# Patient Record
Sex: Female | Born: 1945 | Race: White | Hispanic: No | Marital: Married | Smoking: Never smoker
Health system: Southern US, Community
[De-identification: ages and names within clinical notes are randomized; demographics above are authoritative.]

## PROBLEM LIST (undated history)

## (undated) DIAGNOSIS — R7302 Impaired glucose tolerance (oral): Secondary | ICD-10-CM

## (undated) DIAGNOSIS — K219 Gastro-esophageal reflux disease without esophagitis: Secondary | ICD-10-CM

## (undated) DIAGNOSIS — K5790 Diverticulosis of intestine, part unspecified, without perforation or abscess without bleeding: Secondary | ICD-10-CM

## (undated) DIAGNOSIS — I809 Phlebitis and thrombophlebitis of unspecified site: Secondary | ICD-10-CM

## (undated) DIAGNOSIS — E039 Hypothyroidism, unspecified: Secondary | ICD-10-CM

## (undated) DIAGNOSIS — R296 Repeated falls: Secondary | ICD-10-CM

## (undated) DIAGNOSIS — Z86718 Personal history of other venous thrombosis and embolism: Secondary | ICD-10-CM

## (undated) DIAGNOSIS — F32A Depression, unspecified: Secondary | ICD-10-CM

## (undated) DIAGNOSIS — S42302A Unspecified fracture of shaft of humerus, left arm, initial encounter for closed fracture: Secondary | ICD-10-CM

## (undated) DIAGNOSIS — R768 Other specified abnormal immunological findings in serum: Secondary | ICD-10-CM

## (undated) DIAGNOSIS — E7800 Pure hypercholesterolemia, unspecified: Secondary | ICD-10-CM

## (undated) DIAGNOSIS — W19XXXA Unspecified fall, initial encounter: Secondary | ICD-10-CM

## (undated) DIAGNOSIS — I2699 Other pulmonary embolism without acute cor pulmonale: Secondary | ICD-10-CM

## (undated) DIAGNOSIS — F419 Anxiety disorder, unspecified: Secondary | ICD-10-CM

## (undated) DIAGNOSIS — I739 Peripheral vascular disease, unspecified: Secondary | ICD-10-CM

## (undated) DIAGNOSIS — F319 Bipolar disorder, unspecified: Secondary | ICD-10-CM

## (undated) DIAGNOSIS — I700 Atherosclerosis of aorta: Secondary | ICD-10-CM

## (undated) DIAGNOSIS — M8580 Other specified disorders of bone density and structure, unspecified site: Secondary | ICD-10-CM

## (undated) DIAGNOSIS — E785 Hyperlipidemia, unspecified: Secondary | ICD-10-CM

## (undated) DIAGNOSIS — S42309A Unspecified fracture of shaft of humerus, unspecified arm, initial encounter for closed fracture: Secondary | ICD-10-CM

## (undated) DIAGNOSIS — F329 Major depressive disorder, single episode, unspecified: Secondary | ICD-10-CM

## (undated) DIAGNOSIS — I708 Atherosclerosis of other arteries: Secondary | ICD-10-CM

## (undated) DIAGNOSIS — Z8619 Personal history of other infectious and parasitic diseases: Secondary | ICD-10-CM

## (undated) DIAGNOSIS — N318 Other neuromuscular dysfunction of bladder: Secondary | ICD-10-CM

## (undated) DIAGNOSIS — S42301A Unspecified fracture of shaft of humerus, right arm, initial encounter for closed fracture: Secondary | ICD-10-CM

## (undated) HISTORY — DX: Other specified disorders of bone density and structure, unspecified site: M85.80

## (undated) HISTORY — DX: Phlebitis and thrombophlebitis of unspecified site: I80.9

## (undated) HISTORY — DX: Bipolar disorder, unspecified: F31.9

## (undated) HISTORY — DX: Hyperlipidemia, unspecified: E78.5

## (undated) HISTORY — DX: Atherosclerosis of other arteries: I70.8

## (undated) HISTORY — DX: Diverticulosis of intestine, part unspecified, without perforation or abscess without bleeding: K57.90

## (undated) HISTORY — DX: Unspecified fall, initial encounter: W19.XXXA

## (undated) HISTORY — DX: Personal history of other infectious and parasitic diseases: Z86.19

## (undated) HISTORY — DX: Gastro-esophageal reflux disease without esophagitis: K21.9

## (undated) HISTORY — PX: INGUINAL HERNIA REPAIR: SUR1180

## (undated) HISTORY — DX: Peripheral vascular disease, unspecified: I73.9

## (undated) HISTORY — DX: Atherosclerosis of aorta: I70.0

## (undated) HISTORY — DX: Unspecified fracture of shaft of humerus, left arm, initial encounter for closed fracture: S42.302A

## (undated) HISTORY — DX: Other neuromuscular dysfunction of bladder: N31.8

## (undated) HISTORY — DX: Personal history of other venous thrombosis and embolism: Z86.718

## (undated) HISTORY — DX: Impaired glucose tolerance (oral): R73.02

## (undated) HISTORY — PX: CHOLECYSTECTOMY: SHX55

## (undated) HISTORY — DX: Repeated falls: R29.6

## (undated) HISTORY — DX: Anxiety disorder, unspecified: F41.9

## (undated) HISTORY — DX: Hypothyroidism, unspecified: E03.9

## (undated) HISTORY — DX: Unspecified fracture of shaft of humerus, right arm, initial encounter for closed fracture: S42.301A

---

## 2007-01-12 HISTORY — PX: OTHER SURGICAL HISTORY: SHX169

## 2008-07-14 DIAGNOSIS — S42302A Unspecified fracture of shaft of humerus, left arm, initial encounter for closed fracture: Secondary | ICD-10-CM

## 2008-07-14 HISTORY — DX: Unspecified fracture of shaft of humerus, left arm, initial encounter for closed fracture: S42.302A

## 2008-08-14 HISTORY — PX: COLONOSCOPY: SHX174

## 2008-11-22 LAB — FOLATE: Folate: 8.5

## 2009-03-28 LAB — HEMOGLOBIN A1C: A1c: 5.6

## 2009-05-16 LAB — T4: T3 Uptake: 26

## 2009-07-14 DIAGNOSIS — S42301A Unspecified fracture of shaft of humerus, right arm, initial encounter for closed fracture: Secondary | ICD-10-CM

## 2009-07-14 HISTORY — PX: ARTERIAL THROMBECTOMY: SHX558

## 2009-07-14 HISTORY — DX: Unspecified fracture of shaft of humerus, right arm, initial encounter for closed fracture: S42.301A

## 2009-08-16 LAB — TSH: TSH: 2.01 u[IU]/mL (ref 0.41–5.90)

## 2010-04-15 LAB — COMPREHENSIVE METABOLIC PANEL
ALT: 7 U/L (ref 7–35)
Alkaline Phosphatase: 67 U/L
Calcium: 9.6 mg/dL
Potassium: 4.1 mmol/L
Sodium: 142 mmol/L (ref 137–147)
Total Bilirubin: 0.3 mg/dL

## 2010-04-15 LAB — CBC AND DIFFERENTIAL: platelet count: 200

## 2010-04-15 LAB — LIPID PANEL: Direct LDL: 84

## 2010-11-12 ENCOUNTER — Ambulatory Visit: Payer: Self-pay | Admitting: Family Medicine

## 2010-11-14 ENCOUNTER — Encounter: Payer: Self-pay | Admitting: Family Medicine

## 2010-11-14 ENCOUNTER — Ambulatory Visit (INDEPENDENT_AMBULATORY_CARE_PROVIDER_SITE_OTHER): Payer: Medicare HMO | Admitting: Family Medicine

## 2010-11-14 VITALS — BP 138/64 | HR 80 | Temp 98.4°F | Ht <= 58 in | Wt 175.0 lb

## 2010-11-14 DIAGNOSIS — F319 Bipolar disorder, unspecified: Secondary | ICD-10-CM | POA: Insufficient documentation

## 2010-11-14 DIAGNOSIS — Z7901 Long term (current) use of anticoagulants: Secondary | ICD-10-CM

## 2010-11-14 DIAGNOSIS — F329 Major depressive disorder, single episode, unspecified: Secondary | ICD-10-CM

## 2010-11-14 DIAGNOSIS — Z86718 Personal history of other venous thrombosis and embolism: Secondary | ICD-10-CM

## 2010-11-14 DIAGNOSIS — Z5181 Encounter for therapeutic drug level monitoring: Secondary | ICD-10-CM

## 2010-11-14 DIAGNOSIS — E785 Hyperlipidemia, unspecified: Secondary | ICD-10-CM

## 2010-11-14 DIAGNOSIS — I739 Peripheral vascular disease, unspecified: Secondary | ICD-10-CM

## 2010-11-14 DIAGNOSIS — F419 Anxiety disorder, unspecified: Secondary | ICD-10-CM

## 2010-11-14 DIAGNOSIS — E039 Hypothyroidism, unspecified: Secondary | ICD-10-CM

## 2010-11-14 DIAGNOSIS — K219 Gastro-esophageal reflux disease without esophagitis: Secondary | ICD-10-CM

## 2010-11-14 DIAGNOSIS — F341 Dysthymic disorder: Secondary | ICD-10-CM

## 2010-11-14 LAB — TSH: TSH: 1.79 u[IU]/mL (ref 0.35–5.50)

## 2010-11-14 MED ORDER — BUPROPION HCL ER (SR) 150 MG PO TB12: 150.0000 mg | ORAL_TABLET | Freq: Two times a day (BID) | ORAL | Status: DC

## 2010-11-14 MED ORDER — CLONAZEPAM 0.5 MG PO TABS: 1.0000 mg | ORAL_TABLET | Freq: Two times a day (BID) | ORAL | Status: DC

## 2010-11-14 MED ORDER — LEVOTHYROXINE SODIUM 50 MCG PO TABS: 50.0000 ug | ORAL_TABLET | Freq: Every day | ORAL | Status: DC

## 2010-11-14 MED ORDER — ATORVASTATIN CALCIUM 10 MG PO TABS: 10.0000 mg | ORAL_TABLET | Freq: Every day | ORAL | Status: DC

## 2010-11-14 MED ORDER — DOXEPIN HCL 25 MG PO CAPS: 25.0000 mg | ORAL_CAPSULE | Freq: Every day | ORAL | Status: DC

## 2010-11-14 MED ORDER — WARFARIN SODIUM 5 MG PO TABS: 5.0000 mg | ORAL_TABLET | Freq: Every day | ORAL | Status: DC

## 2010-11-14 MED ORDER — CLOTRIMAZOLE-BETAMETHASONE 1-0.05 % EX CREA: 1.0000 "application " | TOPICAL_CREAM | Freq: Two times a day (BID) | CUTANEOUS | Status: DC

## 2010-11-14 NOTE — Assessment & Plan Note (Signed)
States stable on current regimen of klonopin, wellbutrin and doxepin.  Previously saw psych for management of depression.  States last psychiatrist told her to call for records when establshed.  Have asked her to request records from psych to mail here.  Signed ROI for records from prior PCP.  Refilled all meds x 3 mo except klonopin x 1 mo.

## 2010-11-14 NOTE — Assessment & Plan Note (Signed)
request records and review.  May need f/u here with [facility].  On coumadin and baby ASA

## 2010-11-14 NOTE — Patient Instructions (Addendum)
Coumadin and TSH checked today. Return in 3 months for welcome to medicare visit. Prescriptions printed out today. Good to meet you today, call us with questions.

## 2010-11-14 NOTE — Assessment & Plan Note (Signed)
Endorses hair loss, on exam no hair loss. Check TSH, free T4 today.

## 2010-11-14 NOTE — Progress Notes (Addendum)
  Subjective:    Patient ID: Kathryn Mathews, female    DOB: 12-02-1945, 65 y.o.   MRN: [id number]  HPI CC: new patient, establish  Recently got medicare insurance through [employer].  Moved to [place] from [place] in 07/2008.  Previously saw Dr. Dayna Barker in [place], [facility].  [place], [place] [phone]).  Also saw psychiatrist in [place].  Thyroid - takes synthroid daily, thinks thyroid check January but noticing hair falling out.  No skin changes, weight changes, diarrhea or constipation, heat or cold intolerance.  In 5 mo gained 15 lbs.  Not fasting today.  Due for INR check as well.  States tends to fluctuate.  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  Sounds like had clot on coumadin so ASA added.  Notes easy bruising but no blood in stool/urine.  Also with h/o PAD.  Preventative: Colonoscopy - ~2010.  Told ok. Mammogram - last told normal, doesn't remember. Tetanus - hasn't had.  Medications and allergies reviewed and updated as above. PMHx, FMhx SurgHx and SHx reviewed.  Review of Systems  Constitutional: Negative for fever, chills, activity change, appetite change, fatigue and unexpected weight change.  HENT: Negative for hearing loss and neck pain.   Eyes: Negative for visual disturbance.  Respiratory: Negative for cough, choking, shortness of breath and wheezing.   Cardiovascular: Positive for leg swelling. Negative for chest pain and palpitations.  Gastrointestinal: Negative for nausea, vomiting, abdominal pain, diarrhea, constipation, blood in stool and abdominal distention.  Genitourinary: Negative for hematuria and difficulty urinating.  Musculoskeletal: Negative for myalgias and arthralgias.  Skin: Negative for rash.  Neurological: Negative for dizziness, seizures, syncope and headaches.  Hematological: Bruises/bleeds easily.  Psychiatric/Behavioral: Negative for dysphoric mood (occasional, situational). The patient is not  nervous/anxious.        Objective:   Physical Exam  Nursing note and vitals reviewed. Constitutional: She is oriented to person, place, and time. She appears well-developed and well-nourished. No distress.  HENT:  Head: Normocephalic and atraumatic.  Right Ear: Hearing, external ear and ear canal normal.  Left Ear: Hearing, tympanic membrane, external ear and ear canal normal.  Nose: Nose normal.  Mouth/Throat: Oropharynx is clear and moist. No oropharyngeal exudate.       Cerumen covering R eardrum.  Eyes: Conjunctivae and EOM are normal. Pupils are equal, round, and reactive to light. No scleral icterus.  Neck: Normal range of motion. Neck supple. No thyromegaly present.  Cardiovascular: Normal rate, regular rhythm, normal heart sounds and intact distal pulses.   No murmur heard. Pulses:      Radial pulses are 2+ on the right side, and 2+ on the left side.  Pulmonary/Chest: Effort normal and breath sounds normal. No respiratory distress. She has no wheezes. She has no rales.  Abdominal: Soft. Bowel sounds are normal. She exhibits no distension and no mass. There is no tenderness. There is no rebound and no guarding.  Musculoskeletal: Normal range of motion.  Lymphadenopathy:    She has no cervical adenopathy.  Neurological: She is alert and oriented to person, place, and time.       CN grossly intact, station and gait intact  Skin: Skin is warm and dry. No rash noted.       Hair pull test - no hairs removed.  Psychiatric: She has a normal mood and affect. Her behavior is normal. Judgment and thought content normal.          Assessment & Plan:

## 2010-11-14 NOTE — Assessment & Plan Note (Signed)
INR today 1.8, pt states taking 5mg  coumadin daily.  Increase to 7.5mg  M, Th.  recheck in 2 wks.

## 2010-11-14 NOTE — Assessment & Plan Note (Signed)
Continue lipitor nightly for now, review records.

## 2010-11-18 ENCOUNTER — Telehealth: Payer: Self-pay | Admitting: *Deleted

## 2010-11-18 NOTE — Telephone Encounter (Signed)
Patient called and wants to know why her hair is falling out. She still thinks her thyroid is off since she is losing hair. She said that is why she was losing it before and once her levels got right, she stopped losing hair. I advised her that her thyroid levels were both in range, which means they were right on the current dose of medicine. She didn't seem to agree with the results. I told her that you were recommending a MVT and she wanted to know what that was. I explained that it was a combination pill with many different daily recommended vitamins in it. She wanted to know if the bottle would say "multivitamin". I explained to her that it would, and that if she needed help the pharmacist would help her. She still didn't understand why she needed that and how that was going to help her. I told her that she may be slightly deficient in some of her vitamin levels and maybe that was why she losing her hair which is why you wanted her to try the supplement. She still didn't sound very happy with that recommendation and said if it didn't work she would call back. I told her to take 1 everyday for a month and see if she notices a difference and if not, to let me know. She verbalized understanding.

## 2010-11-27 ENCOUNTER — Ambulatory Visit (INDEPENDENT_AMBULATORY_CARE_PROVIDER_SITE_OTHER): Payer: Medicare HMO | Admitting: Family Medicine

## 2010-11-27 DIAGNOSIS — Z7901 Long term (current) use of anticoagulants: Secondary | ICD-10-CM

## 2010-11-27 DIAGNOSIS — Z5181 Encounter for therapeutic drug level monitoring: Secondary | ICD-10-CM

## 2010-11-27 DIAGNOSIS — I739 Peripheral vascular disease, unspecified: Secondary | ICD-10-CM

## 2010-11-27 LAB — POCT INR: INR: 1.8

## 2010-11-27 NOTE — Patient Instructions (Signed)
Continue current dose, check in 4 weeks  

## 2010-11-28 ENCOUNTER — Ambulatory Visit: Payer: Medicare HMO

## 2010-12-22 ENCOUNTER — Encounter: Payer: Self-pay | Admitting: Family Medicine

## 2010-12-23 NOTE — Telephone Encounter (Signed)
Called to f/u on hair loss. Pt states bought hair vitamin with biotin this week, will start.  Will do this for the next month and let me know how sxs do.  States she say dermatologist in [place], didn't really help much.   Consider referral back to derm vs trial of rogaine?

## 2010-12-25 ENCOUNTER — Ambulatory Visit (INDEPENDENT_AMBULATORY_CARE_PROVIDER_SITE_OTHER): Payer: Medicare HMO | Admitting: Family Medicine

## 2010-12-25 DIAGNOSIS — Z7901 Long term (current) use of anticoagulants: Secondary | ICD-10-CM

## 2010-12-25 DIAGNOSIS — Z5181 Encounter for therapeutic drug level monitoring: Secondary | ICD-10-CM

## 2010-12-25 DIAGNOSIS — I739 Peripheral vascular disease, unspecified: Secondary | ICD-10-CM

## 2010-12-25 NOTE — Patient Instructions (Signed)
Continue 5 mg daily, except 7.5 mg Mon, Thurs, recheck 4 weeks

## 2010-12-29 ENCOUNTER — Encounter: Payer: Self-pay | Admitting: Family Medicine

## 2010-12-31 ENCOUNTER — Encounter: Payer: Self-pay | Admitting: Family Medicine

## 2011-01-22 ENCOUNTER — Ambulatory Visit (INDEPENDENT_AMBULATORY_CARE_PROVIDER_SITE_OTHER): Payer: Medicare HMO | Admitting: Internal Medicine

## 2011-01-22 DIAGNOSIS — Z7901 Long term (current) use of anticoagulants: Secondary | ICD-10-CM

## 2011-01-22 DIAGNOSIS — Z5181 Encounter for therapeutic drug level monitoring: Secondary | ICD-10-CM

## 2011-01-22 DIAGNOSIS — I739 Peripheral vascular disease, unspecified: Secondary | ICD-10-CM

## 2011-01-22 LAB — POCT INR: INR: 2.2

## 2011-01-22 NOTE — Patient Instructions (Signed)
Continue current dose, check in 4 weeks  

## 2011-02-05 ENCOUNTER — Other Ambulatory Visit: Payer: Self-pay | Admitting: Family Medicine

## 2011-02-05 DIAGNOSIS — Z0000 Encounter for general adult medical examination without abnormal findings: Secondary | ICD-10-CM

## 2011-02-05 DIAGNOSIS — E785 Hyperlipidemia, unspecified: Secondary | ICD-10-CM

## 2011-02-05 DIAGNOSIS — E039 Hypothyroidism, unspecified: Secondary | ICD-10-CM

## 2011-02-05 DIAGNOSIS — M8580 Other specified disorders of bone density and structure, unspecified site: Secondary | ICD-10-CM

## 2011-02-12 ENCOUNTER — Other Ambulatory Visit (INDEPENDENT_AMBULATORY_CARE_PROVIDER_SITE_OTHER): Payer: Medicare HMO | Admitting: Family Medicine

## 2011-02-12 DIAGNOSIS — M949 Disorder of cartilage, unspecified: Secondary | ICD-10-CM

## 2011-02-12 DIAGNOSIS — M8580 Other specified disorders of bone density and structure, unspecified site: Secondary | ICD-10-CM

## 2011-02-12 DIAGNOSIS — Z0000 Encounter for general adult medical examination without abnormal findings: Secondary | ICD-10-CM

## 2011-02-12 DIAGNOSIS — E039 Hypothyroidism, unspecified: Secondary | ICD-10-CM

## 2011-02-12 DIAGNOSIS — E785 Hyperlipidemia, unspecified: Secondary | ICD-10-CM

## 2011-02-12 LAB — LIPID PANEL
Cholesterol: 167 mg/dL (ref 0–200)
HDL: 48.5 mg/dL (ref >39.00)
Triglycerides: 161 mg/dL — ABNORMAL HIGH (ref 0.0–149.0)
VLDL: 32.2 mg/dL (ref 0.0–40.0)

## 2011-02-12 LAB — CBC WITH DIFFERENTIAL/PLATELET
Basophils Absolute: 0 10*3/uL (ref 0.0–0.1)
Eosinophils Absolute: 0.2 10*3/uL (ref 0.0–0.7)
Lymphocytes Relative: 30.8 % (ref 12.0–46.0)
MCHC: 33 g/dL (ref 30.0–36.0)
Neutrophils Relative %: 60.5 % (ref 43.0–77.0)
RDW: 14.8 % — ABNORMAL HIGH (ref 11.5–14.6)

## 2011-02-13 LAB — COMPREHENSIVE METABOLIC PANEL
BUN: 16 mg/dL (ref 6–23)
CO2: 24 mEq/L (ref 19–32)
Creatinine, Ser: 0.9 mg/dL (ref 0.4–1.2)
GFR: 66.72 mL/min (ref >60.00)
Glucose, Bld: 115 mg/dL — ABNORMAL HIGH (ref 70–99)
Sodium: 140 mEq/L (ref 135–145)
Total Bilirubin: 0.5 mg/dL (ref 0.3–1.2)
Total Protein: 7.2 g/dL (ref 6.0–8.3)

## 2011-02-18 ENCOUNTER — Ambulatory Visit (INDEPENDENT_AMBULATORY_CARE_PROVIDER_SITE_OTHER): Payer: Medicare HMO | Admitting: Family Medicine

## 2011-02-18 ENCOUNTER — Encounter: Payer: Self-pay | Admitting: Family Medicine

## 2011-02-18 DIAGNOSIS — R7309 Other abnormal glucose: Secondary | ICD-10-CM

## 2011-02-18 DIAGNOSIS — Z0000 Encounter for general adult medical examination without abnormal findings: Secondary | ICD-10-CM | POA: Insufficient documentation

## 2011-02-18 DIAGNOSIS — F329 Major depressive disorder, single episode, unspecified: Secondary | ICD-10-CM

## 2011-02-18 DIAGNOSIS — R7303 Prediabetes: Secondary | ICD-10-CM | POA: Insufficient documentation

## 2011-02-18 DIAGNOSIS — E785 Hyperlipidemia, unspecified: Secondary | ICD-10-CM

## 2011-02-18 DIAGNOSIS — Z1231 Encounter for screening mammogram for malignant neoplasm of breast: Secondary | ICD-10-CM

## 2011-02-18 DIAGNOSIS — M8580 Other specified disorders of bone density and structure, unspecified site: Secondary | ICD-10-CM

## 2011-02-18 DIAGNOSIS — E559 Vitamin D deficiency, unspecified: Secondary | ICD-10-CM | POA: Insufficient documentation

## 2011-02-18 DIAGNOSIS — Z0110 Encounter for examination of ears and hearing without abnormal findings: Secondary | ICD-10-CM

## 2011-02-18 DIAGNOSIS — Z5181 Encounter for therapeutic drug level monitoring: Secondary | ICD-10-CM

## 2011-02-18 DIAGNOSIS — Z23 Encounter for immunization: Secondary | ICD-10-CM

## 2011-02-18 DIAGNOSIS — Z7901 Long term (current) use of anticoagulants: Secondary | ICD-10-CM

## 2011-02-18 DIAGNOSIS — Z0100 Encounter for examination of eyes and vision without abnormal findings: Secondary | ICD-10-CM

## 2011-02-18 LAB — POCT INR: INR: 2.6

## 2011-02-18 MED ORDER — VITAMIN D3 1.25 MG (50000 UT) PO CAPS: 1.0000 | ORAL_CAPSULE | ORAL | Status: DC

## 2011-02-18 NOTE — Progress Notes (Signed)
Subjective:    Patient ID: Kathryn Mathews, female    DOB: 09-03-1945, 65 y.o.   MRN: [id number]  HPI CC: wellness visit  Noticing more bruising recently.  On ASA and coumadin, INR today.  Started on ASA states after had blood clot on coumadin. Lately more irritable towards husband.  Worried about gaining weight in midsection.   Reviewed blood work with patient - Glu fasting 115.  Vit D level low at 17.  Preventative:  Colonoscopy - ~2010. Told ok.  Mammogram - last told normal, several years ago.  Declines breast exam today, does not do breast exams at home. Pap smear - decided against doing years ago.  Always normal in past.  No problems with spotting, not sexually active. Tetanus - hasn't had nor shingles.  Hasn't had pneumonia shot.  Interested in all 3 DEXA 2010 - osteoporosis, femoral -2.1.  Vit D low at 17 this check. Living will - states "if I can't think, just let me go".  Thinks husband knows, would want husband then son to make decisions, but nothing formal yet.  Hasn't spoken with son about this. Vision doing ok.  No recent vision screen.    Medications and allergies reviewed and updated in chart. Patient Active Problem List  Diagnoses  . Anxiety and depression  . GERD (gastroesophageal reflux disease)  . HLD (hyperlipidemia)  . Hypothyroid  . History of DVT (deep vein thrombosis)  . PAD (peripheral artery disease)  . Osteopenia  . Medicare annual wellness visit, initial   Past Medical History  Diagnosis Date  . History of chicken pox   . Anxiety and depression     previously saw psychiatrist  . GERD (gastroesophageal reflux disease)   . HLD (hyperlipidemia)   . Hypothyroid     previously hyperthyroid  . History of DVT (deep vein thrombosis)     on chronic coumadin since 1973, h/o blood clots on coumadin 2011, added ASA QD  . Phlebitis     left leg stays swollen  . PAD (peripheral artery disease)   . Alopecia     well controlled per previous PCP  . Chronic  sinusitis   . Glucose intolerance (impaired glucose tolerance)   . Closed right humeral fracture 2011    coracoid nondisplaced, conservative therapy (Dr. Cleon Gustin)  . Closed left humeral fracture 2010    closed left numeral neck fracture, also h/o L wrist fx  . Hyperactivity of bladder   . Osteopenia     DEXA 2010 - T score -1.3 L spine, -2.1 femoral neck   Past Surgical History  Procedure Date  . Cholecystectomy   . Cesarean section 1973  . Arterial thrombectomy 07/2009    with R mid popliteal artery angioplasty [balloon] (2011)[[[  . Hospitalization 01/2007    depression, ECT   History  Substance Use Topics  . Smoking status: Never Smoker   . Smokeless tobacco: Never Used  . Alcohol Use: No   Family History  Problem Relation Age of Onset  . Coronary artery disease Mother 18  . Hyperlipidemia Mother   . Heart disease Mother   . Hypertension Mother   . Breast cancer Maternal Aunt 54  . Coronary artery disease Maternal Uncle   . Coronary artery disease Maternal Grandmother 70  . Stroke Neg Hx   . Diabetes Neg Hx   . Cancer Neg Hx    Allergies  Allergen Reactions  . Codeine Nausea And Vomiting   Current Outpatient Prescriptions on File Prior to  Visit  Medication Sig Dispense Refill  . aspirin 81 MG tablet Take 81 mg by mouth daily.        Marland Kitchen atorvastatin (LIPITOR) 10 MG tablet Take 1 tablet (10 mg total) by mouth at bedtime.  90 tablet  3  . buPROPion (WELLBUTRIN SR) 150 MG 12 hr tablet Take 1 tablet (150 mg total) by mouth 2 (two) times daily.  180 tablet  3  . clonazePAM (KLONOPIN) 0.5 MG tablet Take 2 tablets (1 mg total) by mouth 2 (two) times daily.  120 tablet  5  . clotrimazole-betamethasone (LOTRISONE) cream Apply 1 application topically 2 (two) times daily.  30 g  1  . doxepin (SINEQUAN) 25 MG capsule Take 1 capsule (25 mg total) by mouth at bedtime.  90 capsule  3  . warfarin (COUMADIN) 5 MG tablet Take 1 tablet (5 mg total) by mouth daily.  90 tablet  3  .  levothyroxine (SYNTHROID, LEVOTHROID) 50 MCG tablet Take 1 tablet (50 mcg total) by mouth daily.  90 tablet  3   Review of Systems  Constitutional: Negative for fever, chills, activity change, appetite change, fatigue and unexpected weight change.  HENT: Negative for hearing loss and neck pain.   Eyes: Negative for visual disturbance.  Respiratory: Negative for cough, chest tightness, shortness of breath and wheezing.   Cardiovascular: Negative for chest pain, palpitations and leg swelling.  Gastrointestinal: Negative for nausea, vomiting, abdominal pain, diarrhea, constipation, blood in stool and abdominal distention.  Genitourinary: Negative for hematuria and difficulty urinating.  Musculoskeletal: Negative for myalgias and arthralgias.  Skin: Negative for rash.  Neurological: Negative for dizziness, seizures, syncope and headaches.  Hematological: Bruises/bleeds easily.  Psychiatric/Behavioral: Negative for dysphoric mood. The patient is not nervous/anxious.        Objective:   Physical Exam  Nursing note and vitals reviewed. Constitutional: She is oriented to person, place, and time. She appears well-developed and well-nourished. No distress.  HENT:  Head: Normocephalic and atraumatic.  Right Ear: External ear normal.  Left Ear: External ear normal.  Nose: Nose normal.  Mouth/Throat: Oropharynx is clear and moist.  Eyes: Conjunctivae and EOM are normal. Pupils are equal, round, and reactive to light.  Neck: Normal range of motion. Neck supple. No thyromegaly present.  Cardiovascular: Normal rate, regular rhythm, normal heart sounds and intact distal pulses.   No murmur heard. Pulses:      Radial pulses are 2+ on the right side, and 2+ on the left side.  Pulmonary/Chest: Effort normal and breath sounds normal. No respiratory distress. She has no wheezes. She has no rales.  Abdominal: Soft. Bowel sounds are normal. She exhibits no distension and no mass. There is no tenderness.  There is no rebound and no guarding.  Musculoskeletal: Normal range of motion.  Lymphadenopathy:    She has no cervical adenopathy.  Neurological: She is alert and oriented to person, place, and time.       CN grossly intact, station and gait intact  Skin: Skin is warm and dry. No rash noted.  Psychiatric: She has a normal mood and affect. Her behavior is normal. Judgment and thought content normal.          Assessment & Plan:

## 2011-02-18 NOTE — Assessment & Plan Note (Signed)
Glu 115 fasting. Check a1c, recheck next visit. Discussed recent weight gain and contribution of this to possible prediabetes, recommended increase exercise/activity, avoid added sugars.

## 2011-02-18 NOTE — Patient Instructions (Addendum)
Check on zofran, make sure you're just taking as needed for nausea. Check on synthroid to make sure you're taking daily. Call eye doctor to set up for vision screen to check glaucoma. Vitamin D level low - start 50k units once weekly for 8 weeks then we will recheck. Sugar was elevated this visit - concern for prediabetes, increase activity (for weight loss and mood), watch added sugars. Increase dairy intake for more calcium. Return in 2-3 months for follow up and recheck vit D levels, as well as sugar. Pneumonia shot today. Check with insurance about shingles shot and tetanus (Tdap) and where they prefer it be administered either here or at pharmacy and let us know. Pass by up front to schedule mammogram.

## 2011-02-18 NOTE — Assessment & Plan Note (Signed)
For increasing irritability, recommended working on healthy ways to cope with stress including increased activity, treadmill/walking update if not better after this.

## 2011-02-18 NOTE — Patient Instructions (Signed)
Continue 5 mg daily, except 7.5 mg Mon, Thurs, recheck 2 weeks

## 2011-02-18 NOTE — Progress Notes (Signed)
Addended by: Josph Macho A on: 02/18/2011 02:02 PM   Modules accepted: Orders

## 2011-02-18 NOTE — Assessment & Plan Note (Signed)
Good control on lipitor.  Goal LDL <100 given PAD. Discussed elevated triglyceride

## 2011-02-18 NOTE — Assessment & Plan Note (Signed)
consider recheck DEXA.

## 2011-02-18 NOTE — Assessment & Plan Note (Signed)
I have personally reviewed the Medicare Annual Wellness questionnaire and have noted 1. The patient's medical and social history 2. Their use of alcohol, tobacco or illicit drugs 3. Their current medications and supplements 4. The patient's functional ability including ADL's, fall risks, home safety risks and hearing or visual             impairment. 5. Diet and physical activities 6. Evidence for depression or mood disorders The patients weight, height, BMI and visual acuity have been recorded in the chart I have made referrals, counseling and provided education to the patient based review of the above and I have provided the pt with a written personalized care plan for preventive services.   Reviewed and discussed healthy eating, discussed more calcium in diet - to increase dairy. Reviewed blood work.

## 2011-02-18 NOTE — Assessment & Plan Note (Signed)
Start supplementation weekly for 8 wks, then reassess. Consider rechecking dexa

## 2011-02-27 ENCOUNTER — Ambulatory Visit
Admission: RE | Admit: 2011-02-27 | Discharge: 2011-02-27 | Disposition: A | Discharge status: Home / Self Care | Payer: Medicare HMO | Source: Ambulatory Visit | Attending: Family Medicine | Admitting: Family Medicine

## 2011-02-27 DIAGNOSIS — Z1231 Encounter for screening mammogram for malignant neoplasm of breast: Secondary | ICD-10-CM

## 2011-03-03 ENCOUNTER — Encounter: Payer: Self-pay | Admitting: *Deleted

## 2011-03-04 ENCOUNTER — Ambulatory Visit (INDEPENDENT_AMBULATORY_CARE_PROVIDER_SITE_OTHER): Payer: Medicare HMO | Admitting: Family Medicine

## 2011-03-04 DIAGNOSIS — I739 Peripheral vascular disease, unspecified: Secondary | ICD-10-CM

## 2011-03-04 DIAGNOSIS — Z7901 Long term (current) use of anticoagulants: Secondary | ICD-10-CM

## 2011-03-04 DIAGNOSIS — Z5181 Encounter for therapeutic drug level monitoring: Secondary | ICD-10-CM

## 2011-03-04 NOTE — Patient Instructions (Signed)
Continue current dose, check in 4 weeks  

## 2011-04-01 ENCOUNTER — Ambulatory Visit (INDEPENDENT_AMBULATORY_CARE_PROVIDER_SITE_OTHER): Payer: Medicare HMO | Admitting: Family Medicine

## 2011-04-01 DIAGNOSIS — I739 Peripheral vascular disease, unspecified: Secondary | ICD-10-CM

## 2011-04-01 DIAGNOSIS — Z7901 Long term (current) use of anticoagulants: Secondary | ICD-10-CM

## 2011-04-01 DIAGNOSIS — Z5181 Encounter for therapeutic drug level monitoring: Secondary | ICD-10-CM

## 2011-04-01 LAB — POCT INR: INR: 3.4

## 2011-04-01 NOTE — Patient Instructions (Signed)
Change to 5 mg daily, recheck 2 weeks

## 2011-04-02 ENCOUNTER — Encounter: Payer: Self-pay | Admitting: Family Medicine

## 2011-04-02 ENCOUNTER — Ambulatory Visit (INDEPENDENT_AMBULATORY_CARE_PROVIDER_SITE_OTHER): Payer: Medicare HMO | Admitting: Family Medicine

## 2011-04-02 VITALS — BP 128/78 | HR 76 | Temp 98.3°F | Wt 172.8 lb

## 2011-04-02 DIAGNOSIS — F32A Depression, unspecified: Secondary | ICD-10-CM

## 2011-04-02 DIAGNOSIS — F419 Anxiety disorder, unspecified: Secondary | ICD-10-CM

## 2011-04-02 DIAGNOSIS — F341 Dysthymic disorder: Secondary | ICD-10-CM

## 2011-04-02 MED ORDER — DOXEPIN HCL 50 MG PO CAPS: 50.0000 mg | ORAL_CAPSULE | Freq: Every day | ORAL | Status: DC

## 2011-04-02 NOTE — Progress Notes (Signed)
  Subjective:    Patient ID: Kathryn Mathews, female    DOB: Apr 22, 1946, 65 y.o.   MRN: [id number]  HPI CC: mood issues  At beginning of visit pt states she mainly came in for and wants to talk about mood swings, not abd pain.  Endorses worsening mood swings.  Sometimes having angry outbursts, doesn't mean to.  Lately having trouble trusting husband, thinks he's moving things around at home behind her back, thinks he taking out money to save for himself, worried he's changed his will.  Unable to discuss with other family because doesn't think they will believe her, says they think she is crazy.  Does endorse feeling paranoid over husband and others being against her.  No HI.  No active thoughts of SI.  + passive suicide thoughts.  Contracts for safety.  On doxepin nightly as well as wellbutrin and clonzepam for anxiety/depression.  Previously saw psychiatrist in [place].  No known h/o bipolar or other mental illness.  Thinks would like to establish with local psychiatrist.  Endorses worsening anxiety, doesn't like to be around anyone or doing anything.  Stressed with husband.  However does feel safe at home.  INR checked yesterday, 3.4.  Denies bleeding from anywhere.  Did feel lightheaded a few times when walking but no falls, no SOB, HA.  Decreased dose, to recheck in 2 wks.  Down to 5mg  daily coumadin.  On coumadin and ASA for h/o blood clots, asa added when had clot on coumadin.  Review of Systems Per HPI    Objective:   Physical Exam  Nursing note and vitals reviewed. Constitutional: She appears well-developed and well-nourished. No distress.  Psychiatric: She has a normal mood and affect. Her speech is normal and behavior is normal. Judgment and thought content normal. Her affect is not angry, not blunt, not labile and not inappropriate. Thought content is not paranoid (not to examiner) and not delusional. Cognition and memory are normal. Cognition and memory are not impaired. She does not  express impulsivity or inappropriate judgment. She does not exhibit a depressed mood. She expresses no homicidal and no suicidal ideation. She expresses no suicidal plans and no homicidal plans.          Assessment & Plan:

## 2011-04-02 NOTE — Assessment & Plan Note (Signed)
Discussed recently deteriorating anxiety, depression.  Discussed option of referral to psychiatrist vs counseling to evaluate sxs and r/o other mental illness.  Also for assistance in titrating/managing meds.  Pt interested in establishing with psychiatry again, however not interested in outpatient group therapy as states has done this in past, did not feel benefited from that. Previously saw psychiatrist in [place]. PHQ 9 = 17/27 with much difficulty endorsed in dealing with day to day living GAD7 = 15/21 Recommended couples counseling. Given endorsed increased anxiety, increase doxepin to 50mg  nightly.  RTC 1 mo for f/u. Discussed how meds may not help if underlying issue is lack of trust in husband and that needs to be addressed so recommended couples counseling.  A total of 25 minutes were spent face-to-face with the patient during this encounter and over half of that time was spent on counseling and coordination of care

## 2011-04-02 NOTE — Patient Instructions (Addendum)
We will refer you to psychiatrist to get established.  Pass by Marion's office to set this up. I'd like to increase doxepin to 50mg  daily (2 pills daily). I'd recommend setting you up with couples therapist.

## 2011-04-04 ENCOUNTER — Other Ambulatory Visit: Payer: Self-pay | Admitting: *Deleted

## 2011-04-04 MED ORDER — VITAMIN D3 1.25 MG (50000 UT) PO CAPS: 1.0000 | ORAL_CAPSULE | ORAL | Status: DC

## 2011-04-04 NOTE — Telephone Encounter (Signed)
Patient requested refill

## 2011-04-04 NOTE — Telephone Encounter (Signed)
Resent electronically. 

## 2011-04-15 ENCOUNTER — Ambulatory Visit (INDEPENDENT_AMBULATORY_CARE_PROVIDER_SITE_OTHER): Payer: Medicare HMO | Admitting: Family Medicine

## 2011-04-15 DIAGNOSIS — I739 Peripheral vascular disease, unspecified: Secondary | ICD-10-CM

## 2011-04-15 DIAGNOSIS — Z5181 Encounter for therapeutic drug level monitoring: Secondary | ICD-10-CM

## 2011-04-15 DIAGNOSIS — Z7901 Long term (current) use of anticoagulants: Secondary | ICD-10-CM

## 2011-04-15 LAB — POCT INR: INR: 2.5

## 2011-04-15 NOTE — Patient Instructions (Signed)
Continue 5 mg daily, recheck 2 weeks

## 2011-04-29 ENCOUNTER — Ambulatory Visit (INDEPENDENT_AMBULATORY_CARE_PROVIDER_SITE_OTHER): Payer: Medicare HMO | Admitting: Family Medicine

## 2011-04-29 DIAGNOSIS — Z5181 Encounter for therapeutic drug level monitoring: Secondary | ICD-10-CM

## 2011-04-29 DIAGNOSIS — I739 Peripheral vascular disease, unspecified: Secondary | ICD-10-CM

## 2011-04-29 DIAGNOSIS — Z7901 Long term (current) use of anticoagulants: Secondary | ICD-10-CM

## 2011-04-29 NOTE — Patient Instructions (Signed)
Continue 5 mg daily, recheck 4 weeks 

## 2011-05-02 ENCOUNTER — Ambulatory Visit: Payer: Medicare HMO | Admitting: Family Medicine

## 2011-05-13 ENCOUNTER — Ambulatory Visit (INDEPENDENT_AMBULATORY_CARE_PROVIDER_SITE_OTHER): Payer: Medicare HMO | Admitting: Family Medicine

## 2011-05-13 ENCOUNTER — Encounter: Payer: Self-pay | Admitting: Family Medicine

## 2011-05-13 DIAGNOSIS — E559 Vitamin D deficiency, unspecified: Secondary | ICD-10-CM

## 2011-05-13 DIAGNOSIS — H6120 Impacted cerumen, unspecified ear: Secondary | ICD-10-CM | POA: Insufficient documentation

## 2011-05-13 MED ORDER — VITAMIN D3 1.25 MG (50000 UT) PO CAPS: 1.0000 | ORAL_CAPSULE | ORAL | Status: DC

## 2011-05-13 NOTE — Assessment & Plan Note (Signed)
R ear - irrigation performed successfully.

## 2011-05-13 NOTE — Progress Notes (Signed)
  Subjective:    Patient ID: Kathryn Mathews, female    DOB: 1946/06/23, 65 y.o.   MRN: [id number]  HPI CC: "my ears are stopped up"  R>L .  Tried 2 different types of otc meds, didn't help.  Going on 1 wk.  No pain, fevers, congestion, ear discharge, viral URTI sxs.    Interested in shingles shot.  Tried to go to drugstore, didn't offer.  requests refill of vit D weekly, completed 2 mo of this.  Vit d was 17 02/2011.  Review of Systems Per HPI    Objective:   Physical Exam  Nursing note and vitals reviewed. Constitutional: She appears well-developed and well-nourished. No distress.  HENT:  Head: Normocephalic and atraumatic.  Right Ear: Hearing, external ear and ear canal normal.  Left Ear: Hearing, tympanic membrane, external ear and ear canal normal.  Nose: Nose normal. No mucosal edema or rhinorrhea.  Mouth/Throat: Uvula is midline, oropharynx is clear and moist and mucous membranes are normal. No oropharyngeal exudate, posterior oropharyngeal edema, posterior oropharyngeal erythema or tonsillar abscesses.       R cerumen impaction deep canal covering TM  Eyes: Conjunctivae and EOM are normal. Pupils are equal, round, and reactive to light. No scleral icterus.  Neck: Normal range of motion. Neck supple.  Lymphadenopathy:    She has no cervical adenopathy.  Skin: Skin is warm and dry. No rash noted.  Psychiatric: She has a normal mood and affect.   irrigation performed pt tolerated well.  visualized TM afterwards   Assessment & Plan:

## 2011-05-13 NOTE — Assessment & Plan Note (Signed)
Weekly for another 2 mo, then recheck.

## 2011-05-13 NOTE — Patient Instructions (Addendum)
Call your insurace about the shingles shot to see if it is covered or how much it would cost and where is cheaper (here or pharmacy).  If you want to receive here, call for nurse visit. I have refilled weekly vitamin D for another 2 months, then would want you to take over the counter 1000 units daily. For ears - irrigation performed today. Come back in January, a few days prior for blood work (we will recheck vitamin D levels then).

## 2011-05-19 ENCOUNTER — Other Ambulatory Visit: Payer: Self-pay | Admitting: *Deleted

## 2011-05-19 MED ORDER — CLONAZEPAM 0.5 MG PO TABS: 1.0000 mg | ORAL_TABLET | Freq: Two times a day (BID) | ORAL | Status: DC

## 2011-05-19 NOTE — Telephone Encounter (Signed)
Pt is asking that refills on clonazepam be sent to [facility].  Fax form is in your box.

## 2011-05-19 NOTE — Telephone Encounter (Signed)
Filled and placed in my out box. 

## 2011-05-21 ENCOUNTER — Other Ambulatory Visit: Payer: Self-pay | Admitting: Family Medicine

## 2011-05-26 ENCOUNTER — Ambulatory Visit (INDEPENDENT_AMBULATORY_CARE_PROVIDER_SITE_OTHER): Payer: Medicare HMO | Admitting: Family Medicine

## 2011-05-26 DIAGNOSIS — Z5181 Encounter for therapeutic drug level monitoring: Secondary | ICD-10-CM

## 2011-05-26 DIAGNOSIS — I739 Peripheral vascular disease, unspecified: Secondary | ICD-10-CM

## 2011-05-26 DIAGNOSIS — Z7901 Long term (current) use of anticoagulants: Secondary | ICD-10-CM

## 2011-05-26 LAB — POCT INR: INR: 2.2

## 2011-05-26 NOTE — Patient Instructions (Signed)
Continue current dose, check in 4 weeks  

## 2011-05-27 ENCOUNTER — Ambulatory Visit: Payer: Medicare HMO

## 2011-06-23 ENCOUNTER — Ambulatory Visit (INDEPENDENT_AMBULATORY_CARE_PROVIDER_SITE_OTHER): Payer: Medicare HMO | Admitting: Family Medicine

## 2011-06-23 ENCOUNTER — Other Ambulatory Visit: Payer: Self-pay | Admitting: Internal Medicine

## 2011-06-23 DIAGNOSIS — Z7901 Long term (current) use of anticoagulants: Secondary | ICD-10-CM

## 2011-06-23 DIAGNOSIS — Z5181 Encounter for therapeutic drug level monitoring: Secondary | ICD-10-CM

## 2011-06-23 DIAGNOSIS — I739 Peripheral vascular disease, unspecified: Secondary | ICD-10-CM

## 2011-06-23 DIAGNOSIS — Z23 Encounter for immunization: Secondary | ICD-10-CM

## 2011-06-23 LAB — POCT INR: INR: 2

## 2011-06-23 MED ORDER — CLOTRIMAZOLE-BETAMETHASONE 1-0.05 % EX CREA: TOPICAL_CREAM | Freq: Two times a day (BID) | CUTANEOUS | Status: DC

## 2011-06-23 NOTE — Telephone Encounter (Signed)
Patient requested refill on her cream.

## 2011-06-23 NOTE — Patient Instructions (Signed)
Continue current dose, check in 4 weeks  

## 2011-06-23 NOTE — Telephone Encounter (Signed)
Sent in

## 2011-06-24 ENCOUNTER — Other Ambulatory Visit: Payer: Self-pay | Admitting: Family Medicine

## 2011-06-25 ENCOUNTER — Telehealth: Payer: Self-pay | Admitting: *Deleted

## 2011-06-25 NOTE — Telephone Encounter (Signed)
Patient called and said when she got up out of bed this morning she was dizzy and off balance. She thought that it may have been a reaction to the flu vaccine that she received on Monday. She said the feeling subsided after sitting still for a while. She said she got nauseated, but had no other symptoms. Offered to schedule appt, but she said she would wait to see how she felt and call back since she was feeling better when I spoke to her.

## 2011-06-26 NOTE — Telephone Encounter (Signed)
Noted  

## 2011-06-30 ENCOUNTER — Telehealth: Payer: Self-pay | Admitting: *Deleted

## 2011-06-30 MED ORDER — CLOTRIMAZOLE-BETAMETHASONE 1-0.05 % EX CREA: TOPICAL_CREAM | Freq: Two times a day (BID) | CUTANEOUS | Status: DC

## 2011-06-30 NOTE — Telephone Encounter (Signed)
Request for Lotrisone cream refill; would like larger quantity [tube], states she goes through small tubes too fast.

## 2011-06-30 NOTE — Telephone Encounter (Signed)
Sent in

## 2011-07-21 ENCOUNTER — Ambulatory Visit (INDEPENDENT_AMBULATORY_CARE_PROVIDER_SITE_OTHER): Payer: Medicare HMO | Admitting: Family Medicine

## 2011-07-21 ENCOUNTER — Encounter: Payer: Self-pay | Admitting: Family Medicine

## 2011-07-21 DIAGNOSIS — R1310 Dysphagia, unspecified: Secondary | ICD-10-CM | POA: Insufficient documentation

## 2011-07-21 DIAGNOSIS — Z5181 Encounter for therapeutic drug level monitoring: Secondary | ICD-10-CM

## 2011-07-21 DIAGNOSIS — I739 Peripheral vascular disease, unspecified: Secondary | ICD-10-CM

## 2011-07-21 DIAGNOSIS — Z7901 Long term (current) use of anticoagulants: Secondary | ICD-10-CM

## 2011-07-21 NOTE — Patient Instructions (Signed)
Let me know if this continues or if you want to be evaluated by stomach doctor. This could be from strictures or ulcer or something else.   Next step would be evaluation by stomach doctor. Call us with questions or if this happens again.

## 2011-07-21 NOTE — Assessment & Plan Note (Signed)
Esophageal phase with what sounds like some odynophagia. Discussed I would want to refer to GI for further eval, possible EGD to eval for cause of sxs last week such as stricture, ulcer, cancer. Pt declines, states that since improved doesn't want further evaluation.  Understands ddx. Will let me know if sxs return for referral.

## 2011-07-21 NOTE — Progress Notes (Signed)
Subjective:    Patient ID: Kathryn Mathews, female    DOB: [birth year], 66 y.o.   MRN: [id number]  HPI CC: abd issues  66 yo with h/o hypothyroid, DVT on chronic coumadin, GERD, anxiety/depression, HLD, prediabetes, PAD presents with 1 wk h/o cold sxs, that led to epistaxis after blowing nose (self limited) also with sensation that food would get "lodged in my esophagus" whenever swallowing.  Felt food stuck.  + pain with swallowing.  This happened for 3 days last week.  Happened with all kinds of food, each time ate something.  Tried to only eat soft food, still didn't help.  Pain with liquids as well but not as bad.  Pain and stuck feeling - points to epigastric region.  Cold sxs resolved.  Now overall feeling better.  No more sxs since Friday  H/o GERD, not on any meds as only bothers her very intermittently.  Denies endoscopy in past.  No heartburn/reflux sxs recently.  No fevers/chills, appetite or weight changes.   Wt Readings from Last 3 Encounters:  07/21/11 175 lb (79.379 kg)  05/13/11 176 lb 12.8 oz (80.196 kg)  04/02/11 172 lb 12 oz (78.359 kg)   Medications and allergies reviewed and updated in chart.  Past histories reviewed and updated if relevant as below. Patient Active Problem List  Diagnoses  . Anxiety and depression  . GERD (gastroesophageal reflux disease)  . HLD (hyperlipidemia)  . Hypothyroid  . History of DVT (deep vein thrombosis)  . PAD (peripheral artery disease)  . Osteopenia  . Medicare annual wellness visit, initial  . Prediabetes  . Vitamin D deficiency  . Cerumen impaction   Past Medical History  Diagnosis Date  . History of chicken pox   . Anxiety and depression     previously saw psychiatrist  . GERD (gastroesophageal reflux disease)   . HLD (hyperlipidemia)   . Hypothyroid     previously hyperthyroid  . History of DVT (deep vein thrombosis)     on chronic coumadin since 1973, h/o blood clots on coumadin 2011, added ASA QD  . Phlebitis    left leg stays swollen  . PAD (peripheral artery disease)   . Alopecia     well controlled per previous PCP  . Chronic sinusitis   . Glucose intolerance (impaired glucose tolerance)   . Closed right humeral fracture 2011    coracoid nondisplaced, conservative therapy (Dr. Cleon Gustin)  . Closed left humeral fracture 2010    closed left numeral neck fracture, also h/o L wrist fx  . Hyperactivity of bladder   . Osteopenia     DEXA 2010 - T score -1.3 L spine, -2.1 femoral neck   Past Surgical History  Procedure Date  . Cholecystectomy   . Cesarean section 1973  . Arterial thrombectomy 07/2009    with R mid popliteal artery angioplasty [balloon] (2011)[[[  . Hospitalization 01/2007    depression, ECT   History  Substance Use Topics  . Smoking status: Never Smoker   . Smokeless tobacco: Never Used  . Alcohol Use: No   Family History  Problem Relation Age of Onset  . Coronary artery disease Mother 31  . Hyperlipidemia Mother   . Heart disease Mother   . Hypertension Mother   . Breast cancer Maternal Aunt 54  . Coronary artery disease Maternal Uncle   . Coronary artery disease Maternal Grandmother 70  . Stroke Neg Hx   . Diabetes Neg Hx   . Cancer Neg Hx  Allergies  Allergen Reactions  . Codeine Nausea And Vomiting   Current Outpatient Prescriptions on File Prior to Visit  Medication Sig Dispense Refill  . aspirin 81 MG tablet Take 81 mg by mouth daily.        Marland Kitchen atorvastatin (LIPITOR) 10 MG tablet Take 1 tablet (10 mg total) by mouth at bedtime.  90 tablet  3  . Biotin 1000 MCG tablet Take 1,000 mcg by mouth 2 (two) times daily.        Marland Kitchen buPROPion (WELLBUTRIN SR) 150 MG 12 hr tablet Take 1 tablet (150 mg total) by mouth 2 (two) times daily.  180 tablet  3  . Cholecalciferol (VITAMIN D3) 50000 UNITS CAPS Take 1 capsule by mouth once a week.  8 capsule  0  . clonazePAM (KLONOPIN) 0.5 MG tablet Take 2 tablets (1 mg total) by mouth 2 (two) times daily.  120 tablet  5  .  clotrimazole-betamethasone (LOTRISONE) cream Apply topically 2 (two) times daily. No more than 2 weeks at a time.  45 g  0  . doxepin (SINEQUAN) 50 MG capsule Take 1 capsule (50 mg total) by mouth at bedtime.  90 capsule  3  . levothyroxine (SYNTHROID, LEVOTHROID) 50 MCG tablet Take 1 tablet (50 mcg total) by mouth daily.  90 tablet  3  . warfarin (COUMADIN) 5 MG tablet Take 1 tablet (5 mg total) by mouth daily.  90 tablet  3    Review of Systems Per HPI    Objective:   Physical Exam  Nursing note and vitals reviewed. Constitutional: She appears well-developed and well-nourished. No distress.  HENT:  Head: Normocephalic and atraumatic.  Mouth/Throat: Oropharynx is clear and moist. No oropharyngeal exudate.  Eyes: Conjunctivae and EOM are normal. Pupils are equal, round, and reactive to light. No scleral icterus.  Neck: Normal range of motion. Neck supple. No thyromegaly present.  Cardiovascular: Normal rate, regular rhythm, normal heart sounds and intact distal pulses.   No murmur heard. Pulmonary/Chest: Effort normal and breath sounds normal. No respiratory distress. She has no wheezes. She has no rales.  Abdominal: Soft. Bowel sounds are normal. She exhibits no distension and no mass. There is no hepatosplenomegaly. There is tenderness (mild) in the epigastric area. There is no rigidity, no rebound and no guarding.  Lymphadenopathy:    She has no cervical adenopathy.      Assessment & Plan:

## 2011-07-21 NOTE — Patient Instructions (Signed)
Continue current dose, check in 4 weeks  

## 2011-08-05 ENCOUNTER — Other Ambulatory Visit: Payer: Self-pay | Admitting: Family Medicine

## 2011-08-05 DIAGNOSIS — E039 Hypothyroidism, unspecified: Secondary | ICD-10-CM

## 2011-08-05 MED ORDER — VITAMIN D 50 MCG (2000 UT) PO CAPS: 2000.0000 [IU] | ORAL_CAPSULE | Freq: Every day | ORAL | Status: DC

## 2011-08-05 NOTE — Telephone Encounter (Signed)
No may stop, and start vit D 2000IU daily. Will check labwork when comes in next week. Placed orders in chart.

## 2011-08-05 NOTE — Telephone Encounter (Signed)
Patient notified and will begin OTC supplement as directed.

## 2011-08-05 NOTE — Telephone Encounter (Signed)
Does she need to continue this? 

## 2011-08-07 ENCOUNTER — Other Ambulatory Visit: Payer: Self-pay | Admitting: Family Medicine

## 2011-08-07 DIAGNOSIS — E039 Hypothyroidism, unspecified: Secondary | ICD-10-CM

## 2011-08-07 DIAGNOSIS — Z86718 Personal history of other venous thrombosis and embolism: Secondary | ICD-10-CM

## 2011-08-07 DIAGNOSIS — R7303 Prediabetes: Secondary | ICD-10-CM

## 2011-08-11 ENCOUNTER — Other Ambulatory Visit (INDEPENDENT_AMBULATORY_CARE_PROVIDER_SITE_OTHER): Payer: Medicare HMO

## 2011-08-11 DIAGNOSIS — R7309 Other abnormal glucose: Secondary | ICD-10-CM

## 2011-08-11 DIAGNOSIS — Z7901 Long term (current) use of anticoagulants: Secondary | ICD-10-CM

## 2011-08-11 DIAGNOSIS — E039 Hypothyroidism, unspecified: Secondary | ICD-10-CM

## 2011-08-11 DIAGNOSIS — R7303 Prediabetes: Secondary | ICD-10-CM

## 2011-08-11 DIAGNOSIS — Z5181 Encounter for therapeutic drug level monitoring: Secondary | ICD-10-CM

## 2011-08-11 DIAGNOSIS — I739 Peripheral vascular disease, unspecified: Secondary | ICD-10-CM

## 2011-08-11 LAB — HEMOGLOBIN A1C: Hgb A1c MFr Bld: 5.5 % (ref 4.6–6.5)

## 2011-08-11 LAB — BASIC METABOLIC PANEL
CO2: 26 mEq/L (ref 19–32)
Calcium: 9.3 mg/dL (ref 8.4–10.5)
Creatinine, Ser: 0.9 mg/dL (ref 0.4–1.2)
GFR: 63.36 mL/min (ref >60.00)
Sodium: 141 mEq/L (ref 135–145)

## 2011-08-11 LAB — TSH: TSH: 4.49 u[IU]/mL (ref 0.35–5.50)

## 2011-08-12 LAB — VITAMIN D 25 HYDROXY (VIT D DEFICIENCY, FRACTURES): Vit D, 25-Hydroxy: 39 ng/mL (ref 30–89)

## 2011-08-14 ENCOUNTER — Ambulatory Visit (INDEPENDENT_AMBULATORY_CARE_PROVIDER_SITE_OTHER): Payer: Medicare HMO | Admitting: Family Medicine

## 2011-08-14 ENCOUNTER — Encounter: Payer: Self-pay | Admitting: Family Medicine

## 2011-08-14 ENCOUNTER — Telehealth: Payer: Self-pay | Admitting: *Deleted

## 2011-08-14 DIAGNOSIS — E039 Hypothyroidism, unspecified: Secondary | ICD-10-CM

## 2011-08-14 DIAGNOSIS — Z5181 Encounter for therapeutic drug level monitoring: Secondary | ICD-10-CM

## 2011-08-14 DIAGNOSIS — Z23 Encounter for immunization: Secondary | ICD-10-CM

## 2011-08-14 DIAGNOSIS — M8580 Other specified disorders of bone density and structure, unspecified site: Secondary | ICD-10-CM

## 2011-08-14 DIAGNOSIS — I739 Peripheral vascular disease, unspecified: Secondary | ICD-10-CM

## 2011-08-14 DIAGNOSIS — Z86718 Personal history of other venous thrombosis and embolism: Secondary | ICD-10-CM

## 2011-08-14 DIAGNOSIS — M949 Disorder of cartilage, unspecified: Secondary | ICD-10-CM

## 2011-08-14 DIAGNOSIS — F329 Major depressive disorder, single episode, unspecified: Secondary | ICD-10-CM

## 2011-08-14 DIAGNOSIS — Z7901 Long term (current) use of anticoagulants: Secondary | ICD-10-CM

## 2011-08-14 LAB — POCT INR: INR: 1.4

## 2011-08-14 MED ORDER — ZOSTER VACCINE LIVE 19400 UNT/0.65ML ~~LOC~~ SOLR: 0.6500 mL | Freq: Once | SUBCUTANEOUS | Status: AC

## 2011-08-14 NOTE — Assessment & Plan Note (Signed)
Vit D level normal On vit D supplementation. DEXA 2010.

## 2011-08-14 NOTE — Telephone Encounter (Signed)
Patient called and said she checked her insurance and would like the Zostavax sent to [facility].

## 2011-08-14 NOTE — Assessment & Plan Note (Deleted)
Resolved per pt.

## 2011-08-14 NOTE — Patient Instructions (Signed)
   7.5 mg today and next Tue/Thurs,5 mg daily all other days. Check in 1 week

## 2011-08-14 NOTE — Patient Instructions (Addendum)
Tetanus shot today (Td). Coumadin check today. Let us know about shingles. Return after 02/12/2012 for wellness visit, fasting prior for blood work. Things looking well today!

## 2011-08-14 NOTE — Assessment & Plan Note (Signed)
Pending establishing with psych in near future H/o hospitalization, ECT in 2008, some paranoid tendencies expressed in past.

## 2011-08-14 NOTE — Telephone Encounter (Signed)
plz notify sent in. To update [place] when receives.

## 2011-08-14 NOTE — Assessment & Plan Note (Signed)
Continue coumadin, check today.

## 2011-08-14 NOTE — Progress Notes (Signed)
  Subjective:    Patient ID: Kathryn Mathews, female    DOB: February 14, 1946, 66 y.o.   MRN: [id number]  HPI CC: 46mo f/u   66 yo with h/o hypothyroid, DVT on chronic coumadin, GERD, anxiety/depression, HLD, PAD presents for 3 mo f/u issues.  Anxiety/depression.  Finding having depression at night time only. Compliant with meds from prior psych.  Has taken a while to get in to psych locally, but will be establishing with them.  HLD - on lipitor 10mg  qhs.  No myalgias.  Hypothyroid - feels weight stable, trouble losing weight.  Compliant with levothyroxine daily. Wt Readings from Last 3 Encounters:  08/14/11 175 lb 12 oz (79.72 kg)  07/21/11 175 lb (79.379 kg)  05/13/11 176 lb 12.8 oz (80.196 kg)   Reviewed blood work in detail. Biotin has significantly helped hair health.  Tetanus - would like today (Td). zostavax too expensive but does want to receive this. Falls - no falls in last year.  Has had falls 2-3 years ago with injury but this was due to stairs.  Now lives in apartment without stairs.  Knows to be very careful when going up/down stairs.  Review of Systems Per HPI    Objective:   Physical Exam  Nursing note and vitals reviewed. Constitutional: She appears well-developed and well-nourished. No distress.  HENT:  Head: Normocephalic and atraumatic.  Mouth/Throat: Oropharynx is clear and moist. No oropharyngeal exudate.  Eyes: Conjunctivae and EOM are normal. Pupils are equal, round, and reactive to light. No scleral icterus.  Neck: Normal range of motion. Neck supple.  Cardiovascular: Normal rate, regular rhythm, normal heart sounds and intact distal pulses.   No murmur heard. Pulmonary/Chest: Effort normal and breath sounds normal. No respiratory distress. She has no wheezes. She has no rales.  Musculoskeletal: She exhibits no edema.  Lymphadenopathy:    She has no cervical adenopathy.  Skin: Skin is warm and dry. No rash noted.  Psychiatric: She has a normal mood and  affect. Her behavior is normal. Judgment and thought content normal.       Assessment & Plan:

## 2011-08-14 NOTE — Assessment & Plan Note (Signed)
Reviewed blood work. Stable. Continue.

## 2011-08-15 NOTE — Telephone Encounter (Signed)
Patient notified and will notify the office when she receives it.

## 2011-08-18 ENCOUNTER — Ambulatory Visit: Payer: Medicare HMO

## 2011-08-21 ENCOUNTER — Ambulatory Visit (INDEPENDENT_AMBULATORY_CARE_PROVIDER_SITE_OTHER): Payer: Medicare HMO | Admitting: Family Medicine

## 2011-08-21 DIAGNOSIS — I779 Disorder of arteries and arterioles, unspecified: Secondary | ICD-10-CM

## 2011-08-21 DIAGNOSIS — I739 Peripheral vascular disease, unspecified: Secondary | ICD-10-CM

## 2011-08-21 DIAGNOSIS — Z7901 Long term (current) use of anticoagulants: Secondary | ICD-10-CM

## 2011-08-21 DIAGNOSIS — Z5181 Encounter for therapeutic drug level monitoring: Secondary | ICD-10-CM

## 2011-08-21 NOTE — Patient Instructions (Signed)
5 mg daily , except 7.5 mg every Tues. Check in 2 weeks

## 2011-09-04 ENCOUNTER — Ambulatory Visit: Payer: Medicare HMO

## 2011-09-09 ENCOUNTER — Ambulatory Visit (INDEPENDENT_AMBULATORY_CARE_PROVIDER_SITE_OTHER): Payer: Medicare HMO | Admitting: Family Medicine

## 2011-09-09 DIAGNOSIS — Z5181 Encounter for therapeutic drug level monitoring: Secondary | ICD-10-CM

## 2011-09-09 DIAGNOSIS — I739 Peripheral vascular disease, unspecified: Secondary | ICD-10-CM

## 2011-09-09 DIAGNOSIS — I779 Disorder of arteries and arterioles, unspecified: Secondary | ICD-10-CM

## 2011-09-09 DIAGNOSIS — Z7901 Long term (current) use of anticoagulants: Secondary | ICD-10-CM

## 2011-09-09 NOTE — Patient Instructions (Signed)
Continue current dose, check in 4 weeks  

## 2011-09-16 ENCOUNTER — Telehealth: Payer: Self-pay | Admitting: *Deleted

## 2011-09-16 NOTE — Telephone Encounter (Signed)
Noted. Thanks.

## 2011-09-16 NOTE — Telephone Encounter (Signed)
Patient called back and said she would just wait to see you since this is not a new problem for her. Appt scheduled for Thursday.

## 2011-09-16 NOTE — Telephone Encounter (Signed)
Patient called at 3:15 pm and said her legs have been giving out and caused her to fall in the tub today. She denied any injury but she wanted to know if you could tell her what was wrong. I advised that you would need to see her in order to try to give her a dx but that you may have to get another opinion. I advised it could be caused by a number of things. She denied blurry vision, HA, one-sided weakness or slurred speech. She wanted to be seen today. I advised that the schedule was completely full. I advised if she wanted to be seen today, she would need to go to an [facility]. I gave her directions to [facility] so they would have access to her records.

## 2011-09-18 ENCOUNTER — Ambulatory Visit (INDEPENDENT_AMBULATORY_CARE_PROVIDER_SITE_OTHER): Payer: Medicare HMO | Admitting: Family Medicine

## 2011-09-18 ENCOUNTER — Encounter: Payer: Self-pay | Admitting: Family Medicine

## 2011-09-18 ENCOUNTER — Ambulatory Visit (INDEPENDENT_AMBULATORY_CARE_PROVIDER_SITE_OTHER)
Admission: RE | Admit: 2011-09-18 | Discharge: 2011-09-18 | Disposition: A | Discharge status: Home / Self Care | Payer: Medicare HMO | Source: Ambulatory Visit | Attending: Family Medicine | Admitting: Family Medicine

## 2011-09-18 VITALS — BP 122/80 | HR 80 | Temp 98.6°F | Wt 177.2 lb

## 2011-09-18 DIAGNOSIS — R0989 Other specified symptoms and signs involving the circulatory and respiratory systems: Secondary | ICD-10-CM | POA: Insufficient documentation

## 2011-09-18 DIAGNOSIS — Z86718 Personal history of other venous thrombosis and embolism: Secondary | ICD-10-CM

## 2011-09-18 DIAGNOSIS — W19XXXA Unspecified fall, initial encounter: Secondary | ICD-10-CM

## 2011-09-18 DIAGNOSIS — M8580 Other specified disorders of bone density and structure, unspecified site: Secondary | ICD-10-CM

## 2011-09-18 DIAGNOSIS — R2689 Other abnormalities of gait and mobility: Secondary | ICD-10-CM | POA: Insufficient documentation

## 2011-09-18 DIAGNOSIS — M949 Disorder of cartilage, unspecified: Secondary | ICD-10-CM

## 2011-09-18 NOTE — Progress Notes (Signed)
Subjective:    Patient ID: Kathryn Mathews, female    DOB: [birth year], 66 y.o.   MRN: [id number]  HPI CC: falls recently  Recent falls.  This has been going on for the last year.  Feels knees give out - knees buckle underneath her.  Also at times feels staggers when walking.  Tends to lean left.  Latest fall was this morning, fell over hamper, no injury.  Yesterday had fall in bath tub - trouble getting up.  Has not hurt herself with any of these falls.   Aware of falls.  Holds on to husband to walk.  Prior used cane but no longer.  Has never used walker.  Has never had PT at home or home safety evaluation.  H/o osteopenia, on chronic coumadin for DVTs, h/o humoral fractures bilateral arms 2010 and 2011.  Denies dizziness, vertigo, prodromal sxs, presyncope.  No HA, LOC.  No back pain or knee pain.  Vision stable, last vision exam was ~02/2011.  No recent changes in meds.    Several psychotropic meds, sees Dr. Imogene Burn psychiatry, no recent changes.  No anosmia.  Today started breathing more loudly, wonders if developing URTI.  No SOB or cough.  No recent CXR.  Medications and allergies reviewed and updated in chart.  Past histories reviewed and updated if relevant as below. Patient Active Problem List  Diagnoses  . Anxiety and depression  . GERD (gastroesophageal reflux disease)  . HLD (hyperlipidemia)  . Hypothyroid  . History of DVT (deep vein thrombosis)  . PAD (peripheral artery disease)  . Osteopenia  . Medicare annual wellness visit, initial  . Vitamin D deficiency  . Cerumen impaction  . Dysphagia   Past Medical History  Diagnosis Date  . History of chicken pox   . Anxiety and depression     previously saw psychiatrist  . GERD (gastroesophageal reflux disease)   . HLD (hyperlipidemia)   . Hypothyroid     previously hyperthyroid  . History of DVT (deep vein thrombosis)     on chronic coumadin since 1973, h/o blood clots on coumadin 2011, added ASA QD  . Phlebitis     left leg stays swollen  . PAD (peripheral artery disease)   . Alopecia     well controlled per previous PCP  . Chronic sinusitis   . Glucose intolerance (impaired glucose tolerance)   . Closed right humeral fracture 2011    coracoid nondisplaced, conservative therapy (Dr. Cleon Gustin)  . Closed left humeral fracture 2010    closed left numeral neck fracture, also h/o L wrist fx  . Hyperactivity of bladder   . Osteopenia     DEXA 2010: T score -1.3 L spine, -2.1 femoral neck   Past Surgical History  Procedure Date  . Cholecystectomy   . Cesarean section 1973  . Arterial thrombectomy 07/2009    with R mid popliteal artery angioplasty [balloon] (2011)[[[  . Hospitalization 01/2007    depression, ECT   History  Substance Use Topics  . Smoking status: Never Smoker   . Smokeless tobacco: Never Used  . Alcohol Use: No   Family History  Problem Relation Age of Onset  . Coronary artery disease Mother 49  . Hyperlipidemia Mother   . Heart disease Mother   . Hypertension Mother   . Breast cancer Maternal Aunt 54  . Coronary artery disease Maternal Uncle   . Coronary artery disease Maternal Grandmother 70  . Stroke Neg Hx   . Diabetes Neg Hx   .  Cancer Neg Hx    Allergies  Allergen Reactions  . Codeine Nausea And Vomiting   Current Outpatient Prescriptions on File Prior to Visit  Medication Sig Dispense Refill  . aspirin 81 MG tablet Take 81 mg by mouth daily.        Marland Kitchen atorvastatin (LIPITOR) 10 MG tablet Take 1 tablet (10 mg total) by mouth at bedtime.  90 tablet  3  . Biotin 1000 MCG tablet Take 1,000 mcg by mouth 2 (two) times daily.        Marland Kitchen buPROPion (WELLBUTRIN SR) 150 MG 12 hr tablet Take 1 tablet (150 mg total) by mouth 2 (two) times daily.  180 tablet  3  . Cholecalciferol (VITAMIN D) 2000 UNITS CAPS Take 1 capsule (2,000 Units total) by mouth daily.      . clonazePAM (KLONOPIN) 0.5 MG tablet Take 2 tablets (1 mg total) by mouth 2 (two) times daily.  120 tablet  5  .  clotrimazole-betamethasone (LOTRISONE) cream Apply topically 2 (two) times daily. No more than 2 weeks at a time.  45 g  0  . doxepin (SINEQUAN) 50 MG capsule Take 1 capsule (50 mg total) by mouth at bedtime.  90 capsule  3  . levothyroxine (SYNTHROID, LEVOTHROID) 50 MCG tablet Take 1 tablet (50 mcg total) by mouth daily.  90 tablet  3  . warfarin (COUMADIN) 5 MG tablet Take 1 tablet (5 mg total) by mouth daily.  90 tablet  3  . lamoTRIgine (LAMICTAL) 25 MG tablet Take 25 mg by mouth daily.       Review of Systems Per HPI    Objective:   Physical Exam  Nursing note and vitals reviewed. Constitutional: She is oriented to person, place, and time. She appears well-developed and well-nourished. No distress.  HENT:  Head: Normocephalic and atraumatic.  Mouth/Throat: Oropharynx is clear and moist. No oropharyngeal exudate.  Eyes: Conjunctivae and EOM are normal. Pupils are equal, round, and reactive to light. No scleral icterus.  Neck: Normal range of motion. Neck supple. Carotid bruit is not present.  Cardiovascular: Normal rate, regular rhythm, normal heart sounds and intact distal pulses.   No murmur heard. Pulmonary/Chest: Effort normal. No respiratory distress. She has no wheezes. She has no rales.       Obvious loud rhonchus LLL  Musculoskeletal: She exhibits no edema.  Lymphadenopathy:    She has no cervical adenopathy.  Neurological: She is alert and oriented to person, place, and time. She has normal strength. No cranial nerve deficit or sensory deficit. She exhibits normal muscle tone. She displays a negative Romberg sign. Coordination abnormal. Gait normal.       Decreased fluency of finger to nose on left side Trouble completing HTS 2/2 baseline R knee stiffness No pronator drift. No nystagmus. No shuffling gait, no masked fascies. Mildly uncentered gait.  Skin: Skin is warm and dry. No rash noted.  Psychiatric: She has a normal mood and affect.       Assessment & Plan:

## 2011-09-18 NOTE — Assessment & Plan Note (Signed)
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  High risk for complications after fall. Continues to have falls. Unclear etiology.  No vertigo or dizziness or prodromal sxs.  Not orthostatic today.  Doubt cardiac.  ?drop attacks as cognisant throughout fall.  Doubt MSK issue as denies any pain. On several psychotropic meds - start by decreasing klonopin to 0.5mg  in am and 1mg  in pm. Will also refer to [facility] PT for home safety evaluation as well as ambulatory assistive device evaluation. rec start using cane again.

## 2011-09-18 NOTE — Patient Instructions (Addendum)
I'm not sure where these falls are coming from. We will set you up for home health physical therapy - someone will be in contact with you about this. I'd recommend to start using a cane - the physical therapist will help you with this. I also recommend backing down on klonopin as this can make you more prone to falls.  Start taking 1 tablet in am and 2 at night (as opposed to 2 in am and 2 at night). Xray today looking ok - if any change based on what radiology thinks we will let you know. Good to see you today, call us with questions.

## 2011-09-18 NOTE — Assessment & Plan Note (Signed)
New, obvious LLL rhonchus on exam.  Check CXR to r/o infection, mass as cause of this.  Clear on my read.

## 2011-09-19 ENCOUNTER — Telehealth: Payer: Self-pay | Admitting: *Deleted

## 2011-09-19 NOTE — Telephone Encounter (Signed)
Error

## 2011-09-23 ENCOUNTER — Telehealth: Payer: Self-pay | Admitting: *Deleted

## 2011-09-23 NOTE — Telephone Encounter (Signed)
Jill notified. She should also be faxing paperwork on patient's admission to [facility].

## 2011-09-23 NOTE — Telephone Encounter (Signed)
Home health physical therapist called to alert you to a serious interaction between pt's aspirin and warfarin and she is asking if you want pt to continue with both of these since she has been falling more recently.  Please advise.

## 2011-09-23 NOTE — Telephone Encounter (Signed)
Ok to continue for now - h/o DVT on coumadin.

## 2011-10-01 ENCOUNTER — Ambulatory Visit (INDEPENDENT_AMBULATORY_CARE_PROVIDER_SITE_OTHER): Payer: Medicare HMO | Admitting: Family Medicine

## 2011-10-01 ENCOUNTER — Encounter: Payer: Self-pay | Admitting: Family Medicine

## 2011-10-01 VITALS — BP 128/82 | HR 82 | Temp 98.2°F | Wt 176.5 lb

## 2011-10-01 DIAGNOSIS — J40 Bronchitis, not specified as acute or chronic: Secondary | ICD-10-CM

## 2011-10-01 DIAGNOSIS — Z5181 Encounter for therapeutic drug level monitoring: Secondary | ICD-10-CM

## 2011-10-01 DIAGNOSIS — Z86718 Personal history of other venous thrombosis and embolism: Secondary | ICD-10-CM

## 2011-10-01 DIAGNOSIS — I739 Peripheral vascular disease, unspecified: Secondary | ICD-10-CM

## 2011-10-01 DIAGNOSIS — Z7901 Long term (current) use of anticoagulants: Secondary | ICD-10-CM

## 2011-10-01 DIAGNOSIS — I779 Disorder of arteries and arterioles, unspecified: Secondary | ICD-10-CM

## 2011-10-01 LAB — POCT INR: INR: 3

## 2011-10-01 MED ORDER — AZITHROMYCIN 250 MG PO TABS: ORAL_TABLET | ORAL | Status: AC

## 2011-10-01 NOTE — Patient Instructions (Addendum)
Continue current dose, check in 4 weeks  

## 2011-10-01 NOTE — Assessment & Plan Note (Signed)
Heard ronchi LLL last visit, CXR clear (09/18/2011).  After this started with URTI sxs. Anticipate bronchitis vs viral URTI.  As seems to be improving on its own, advised continue to monitor for now. Given comorbidities, provided with [facility] script for zpack to fill if worsening again or not improving as expected. Update [place] if worsening despite abx. No fevers. Good O2 sat today.

## 2011-10-01 NOTE — Progress Notes (Signed)
Subjective:    Patient ID: Kathryn Mathews, female    DOB: 1946/05/21, 66 y.o.   MRN: [id number]  HPI CC: URI sxs  10d h/o productive cough with dark mucous.  Some mucous when blowing nose as well.  Yesterday felt especially bad.  Today actually feeling better.  + coryza, rhinorrhea, PNdrainage.  Cough improving some over last 24 hours.  So far has tried some OTC med which "kinda helped".  No sick contacts at home.  No smokers at home.  No h/o asthma/COPD.  No fevers/chills, abd pain, HA, ear pain or tooth pain.  Seen here 09/18/2011 with increasing falls, set up with HHPT, decreased klonopin to 1 in am and 2 at night to see if benzo related increased falls.  Pt does not endorse any improvement with decreased benzo dose.  Has started using cane, notes much more steady on feet, husband doesn't have to support her nearly as much.  HHPT coming out to house but pt had to cancel last few session 2/2 not feeling will.  On chronic coumadin for h/o recurrent DVTs.  baby aspirin added 2011 because had DVT despite therapeutic INR.  Has not seen hematologist recently, not interested in returning currently.  Unsure if has had coagulability workup.  Past Medical History  Diagnosis Date  . History of chicken pox   . Anxiety and depression     previously saw psychiatrist  . GERD (gastroesophageal reflux disease)   . HLD (hyperlipidemia)   . Hypothyroid     previously hyperthyroid  . History of DVT (deep vein thrombosis)     on chronic coumadin since 1973, h/o blood clots on coumadin 2011, added ASA QD  . Phlebitis     left leg stays swollen  . PAD (peripheral artery disease)   . Alopecia     well controlled per previous PCP  . Chronic sinusitis   . Glucose intolerance (impaired glucose tolerance)   . Closed right humeral fracture 2011    coracoid nondisplaced, conservative therapy (Dr. Cleon Gustin)  . Closed left humeral fracture 2010    closed left numeral neck fracture, also h/o L wrist fx  .  Hyperactivity of bladder   . Osteopenia     DEXA 2010: T score -1.3 L spine, -2.1 femoral neck    Review of Systems Per HPI    Objective:   Physical Exam  Nursing note and vitals reviewed. Constitutional: She appears well-developed and well-nourished. No distress.  HENT:  Head: Normocephalic and atraumatic.  Right Ear: Hearing, tympanic membrane, external ear and ear canal normal.  Left Ear: Hearing, tympanic membrane, external ear and ear canal normal.  Nose: No mucosal edema or rhinorrhea. Right sinus exhibits no maxillary sinus tenderness and no frontal sinus tenderness. Left sinus exhibits no maxillary sinus tenderness and no frontal sinus tenderness.  Mouth/Throat: Uvula is midline, oropharynx is clear and moist and mucous membranes are normal. No oropharyngeal exudate, posterior oropharyngeal edema, posterior oropharyngeal erythema or tonsillar abscesses.       Dry nasal mucosa  Eyes: Conjunctivae and EOM are normal. Pupils are equal, round, and reactive to light. No scleral icterus.  Neck: Normal range of motion. Neck supple.  Cardiovascular: Normal rate, regular rhythm, normal heart sounds and intact distal pulses.   No murmur heard. Pulmonary/Chest: Effort normal and breath sounds normal. No respiratory distress. She has no wheezes. She has no rales.  Lymphadenopathy:    She has no cervical adenopathy.  Skin: Skin is warm and dry. No rash  noted.      Assessment & Plan:

## 2011-10-01 NOTE — Patient Instructions (Addendum)
You have an upper respiratory infection - may be bronchitis.  As improving, continue to watch for now.  If worsening, fill antibiotic provided today.  If antibiotic filled today, return 1 week after starting antibiotic to recheck coumadin.  Otherwise return as instructed by Kathryn Mathews. zpack provided today Push fluids and plenty of rest. May use simple mucinex with plenty of fluid to help mobilize mucous. Let us know if fever >101.5, trouble opening/closing mouth, difficulty swallowing, or worsening - you may need to be seen again. Follow up with physical therapy regarding imbalance.  i'm glad the cane is helping! Given increasing falls, stop aspirin.  Goal INR will be 2.5-3.0 (show sheet to Kathryn Mathews).  INR today.

## 2011-10-01 NOTE — Assessment & Plan Note (Addendum)
Coumadin since 1973. Baby aspirin added 2011 2/2 DVT on coumadin.  Pt thinks INR was therapeutic then. Discussed extensively clotting risk vs bleeding risk. Given increasing falls recently, decided to stop aspirin.  Will change goal INR to 2.5 -3 in hopes of keeping blood thinner and avoid recurrent DVT. Pt not interested in pursuing further eval with hematology or workup at this time. INR today 3.0.

## 2011-10-07 ENCOUNTER — Ambulatory Visit: Payer: Medicare HMO

## 2011-10-15 ENCOUNTER — Ambulatory Visit: Payer: Medicare HMO

## 2011-10-16 ENCOUNTER — Other Ambulatory Visit: Payer: Self-pay | Admitting: Family Medicine

## 2011-10-16 MED ORDER — LEVOTHYROXINE SODIUM 50 MCG PO TABS: 50.0000 ug | ORAL_TABLET | Freq: Every day | ORAL | Status: DC

## 2011-10-16 MED ORDER — BUPROPION HCL ER (SR) 150 MG PO TB12: 150.0000 mg | ORAL_TABLET | Freq: Two times a day (BID) | ORAL | Status: DC

## 2011-10-16 MED ORDER — ATORVASTATIN CALCIUM 10 MG PO TABS: 10.0000 mg | ORAL_TABLET | Freq: Every day | ORAL | Status: DC

## 2011-10-16 MED ORDER — CLOTRIMAZOLE-BETAMETHASONE 1-0.05 % EX CREA: TOPICAL_CREAM | Freq: Two times a day (BID) | CUTANEOUS | Status: DC

## 2011-10-16 MED ORDER — WARFARIN SODIUM 5 MG PO TABS: 5.0000 mg | ORAL_TABLET | Freq: Every day | ORAL | Status: DC

## 2011-10-20 ENCOUNTER — Encounter: Payer: Self-pay | Admitting: Family Medicine

## 2011-10-23 DIAGNOSIS — M899 Disorder of bone, unspecified: Secondary | ICD-10-CM

## 2011-10-23 DIAGNOSIS — IMO0001 Reserved for inherently not codable concepts without codable children: Secondary | ICD-10-CM

## 2011-10-23 DIAGNOSIS — M949 Disorder of cartilage, unspecified: Secondary | ICD-10-CM

## 2011-10-23 DIAGNOSIS — R269 Unspecified abnormalities of gait and mobility: Secondary | ICD-10-CM

## 2011-10-30 ENCOUNTER — Ambulatory Visit: Payer: Medicare HMO

## 2011-10-30 ENCOUNTER — Ambulatory Visit (INDEPENDENT_AMBULATORY_CARE_PROVIDER_SITE_OTHER): Payer: Medicare HMO | Admitting: Family Medicine

## 2011-10-30 DIAGNOSIS — I739 Peripheral vascular disease, unspecified: Secondary | ICD-10-CM

## 2011-10-30 DIAGNOSIS — Z7901 Long term (current) use of anticoagulants: Secondary | ICD-10-CM

## 2011-10-30 DIAGNOSIS — I779 Disorder of arteries and arterioles, unspecified: Secondary | ICD-10-CM

## 2011-10-30 DIAGNOSIS — Z5181 Encounter for therapeutic drug level monitoring: Secondary | ICD-10-CM

## 2011-10-30 LAB — POCT INR: INR: 3.3

## 2011-10-30 NOTE — Patient Instructions (Signed)
5 mg daily (redused dose by 2.5 mg weekly)check in 2 weeks

## 2011-11-13 ENCOUNTER — Ambulatory Visit (INDEPENDENT_AMBULATORY_CARE_PROVIDER_SITE_OTHER): Payer: Medicare HMO | Admitting: Family Medicine

## 2011-11-13 DIAGNOSIS — Z5181 Encounter for therapeutic drug level monitoring: Secondary | ICD-10-CM

## 2011-11-13 DIAGNOSIS — I739 Peripheral vascular disease, unspecified: Secondary | ICD-10-CM

## 2011-11-13 DIAGNOSIS — I779 Disorder of arteries and arterioles, unspecified: Secondary | ICD-10-CM

## 2011-11-13 DIAGNOSIS — Z7901 Long term (current) use of anticoagulants: Secondary | ICD-10-CM

## 2011-11-13 LAB — POCT INR: INR: 2.4

## 2011-11-13 NOTE — Patient Instructions (Signed)
Continue current dose, check in 4 weeks  

## 2011-11-20 ENCOUNTER — Other Ambulatory Visit: Payer: Self-pay | Admitting: *Deleted

## 2011-11-20 ENCOUNTER — Telehealth: Payer: Self-pay | Admitting: *Deleted

## 2011-11-20 DIAGNOSIS — F329 Major depressive disorder, single episode, unspecified: Secondary | ICD-10-CM

## 2011-11-20 DIAGNOSIS — F319 Bipolar disorder, unspecified: Secondary | ICD-10-CM

## 2011-11-20 MED ORDER — DOXEPIN HCL 50 MG PO CAPS: 50.0000 mg | ORAL_CAPSULE | Freq: Every day | ORAL | Status: DC

## 2011-11-20 NOTE — Telephone Encounter (Signed)
Message left advising patient to expect call from [facility] for psych referral. Advised she would need to see someone for that refill. Advised to call with any questions.

## 2011-11-20 NOTE — Telephone Encounter (Signed)
Needs to call psych to refill seroquel since we don't know what dose she was taking. She needs psychiatrist, Dr. Laymond Purser is psychologist.  Prior was seeing Dr. Imogene Burn. Have placed referral in chart.

## 2011-11-20 NOTE — Telephone Encounter (Signed)
Patient called and said she had being a psychiatrist for bipolar. She was requesting a referral to another one (Dr. Laymond Purser is fine). She said psych had her taking seroquel and she doesn't have anymore. She wasn't sure of the dosage. It's not on her med list either. She wanted a refill, but I told her I would have to check with you since I didn't know her dosage. She is aware that it will be next week before she hears back from me.

## 2011-12-09 ENCOUNTER — Other Ambulatory Visit: Payer: Self-pay | Admitting: Family Medicine

## 2011-12-09 NOTE — Telephone Encounter (Signed)
OK to refill

## 2011-12-09 NOTE — Telephone Encounter (Signed)
Rx called in as directed.   

## 2011-12-09 NOTE — Telephone Encounter (Signed)
plz phone in. 

## 2011-12-10 ENCOUNTER — Ambulatory Visit (INDEPENDENT_AMBULATORY_CARE_PROVIDER_SITE_OTHER): Payer: Medicare HMO | Admitting: Family Medicine

## 2011-12-10 DIAGNOSIS — Z5181 Encounter for therapeutic drug level monitoring: Secondary | ICD-10-CM

## 2011-12-10 DIAGNOSIS — Z7901 Long term (current) use of anticoagulants: Secondary | ICD-10-CM

## 2011-12-10 DIAGNOSIS — I739 Peripheral vascular disease, unspecified: Secondary | ICD-10-CM

## 2011-12-10 DIAGNOSIS — I779 Disorder of arteries and arterioles, unspecified: Secondary | ICD-10-CM

## 2011-12-10 NOTE — Patient Instructions (Signed)
  5 mg daily except 7.5 Wed check in 2 weeks

## 2011-12-24 ENCOUNTER — Ambulatory Visit (INDEPENDENT_AMBULATORY_CARE_PROVIDER_SITE_OTHER): Payer: Medicare HMO | Admitting: Family Medicine

## 2011-12-24 DIAGNOSIS — I739 Peripheral vascular disease, unspecified: Secondary | ICD-10-CM

## 2011-12-24 DIAGNOSIS — Z5181 Encounter for therapeutic drug level monitoring: Secondary | ICD-10-CM

## 2011-12-24 DIAGNOSIS — Z7901 Long term (current) use of anticoagulants: Secondary | ICD-10-CM

## 2011-12-24 LAB — POCT INR: INR: 2.8

## 2011-12-24 NOTE — Patient Instructions (Addendum)
5 mg daily except 7.5 Wed check in 4 weeks 

## 2011-12-30 ENCOUNTER — Telehealth: Payer: Self-pay

## 2011-12-30 NOTE — Telephone Encounter (Signed)
Pt has area on face that has scabbed over recently but noticed for approx 1 year. Changed in color to dark brown. When touches area hurts. No drainage. Pt has appt  to see dermatologist in [place] today 11:00. Pt wants to know if covered by insurance. Pt spoke with [place]/

## 2011-12-30 NOTE — Telephone Encounter (Signed)
Patient doesn't need referral to see a Dermatologist as long as they accept [facility]. Spoke to patient regarding this .

## 2012-01-21 ENCOUNTER — Ambulatory Visit (INDEPENDENT_AMBULATORY_CARE_PROVIDER_SITE_OTHER): Payer: Medicare HMO | Admitting: Family Medicine

## 2012-01-21 DIAGNOSIS — Z5181 Encounter for therapeutic drug level monitoring: Secondary | ICD-10-CM

## 2012-01-21 DIAGNOSIS — Z7901 Long term (current) use of anticoagulants: Secondary | ICD-10-CM

## 2012-01-21 DIAGNOSIS — I739 Peripheral vascular disease, unspecified: Secondary | ICD-10-CM

## 2012-01-21 LAB — POCT INR: INR: 2.9

## 2012-01-21 NOTE — Patient Instructions (Signed)
5 mg daily except 7.5 Wed check in 4 weeks

## 2012-02-18 ENCOUNTER — Ambulatory Visit (INDEPENDENT_AMBULATORY_CARE_PROVIDER_SITE_OTHER): Payer: Medicare HMO | Admitting: Family Medicine

## 2012-02-18 DIAGNOSIS — Z5181 Encounter for therapeutic drug level monitoring: Secondary | ICD-10-CM

## 2012-02-18 DIAGNOSIS — I739 Peripheral vascular disease, unspecified: Secondary | ICD-10-CM

## 2012-02-18 DIAGNOSIS — Z7901 Long term (current) use of anticoagulants: Secondary | ICD-10-CM

## 2012-02-18 NOTE — Patient Instructions (Signed)
5 mg daily (reduced 2.5mg  weekly, patient has no appetite) check in 2 weeks

## 2012-02-19 ENCOUNTER — Other Ambulatory Visit: Payer: Self-pay | Admitting: Family Medicine

## 2012-02-19 DIAGNOSIS — E785 Hyperlipidemia, unspecified: Secondary | ICD-10-CM

## 2012-02-19 DIAGNOSIS — Z5181 Encounter for therapeutic drug level monitoring: Secondary | ICD-10-CM

## 2012-02-19 DIAGNOSIS — Z7901 Long term (current) use of anticoagulants: Secondary | ICD-10-CM

## 2012-02-19 DIAGNOSIS — E039 Hypothyroidism, unspecified: Secondary | ICD-10-CM

## 2012-03-03 ENCOUNTER — Ambulatory Visit (INDEPENDENT_AMBULATORY_CARE_PROVIDER_SITE_OTHER): Payer: Medicare HMO | Admitting: Family Medicine

## 2012-03-03 DIAGNOSIS — E039 Hypothyroidism, unspecified: Secondary | ICD-10-CM

## 2012-03-03 DIAGNOSIS — Z5181 Encounter for therapeutic drug level monitoring: Secondary | ICD-10-CM

## 2012-03-03 DIAGNOSIS — E785 Hyperlipidemia, unspecified: Secondary | ICD-10-CM

## 2012-03-03 DIAGNOSIS — I739 Peripheral vascular disease, unspecified: Secondary | ICD-10-CM

## 2012-03-03 DIAGNOSIS — Z7901 Long term (current) use of anticoagulants: Secondary | ICD-10-CM

## 2012-03-03 LAB — COMPREHENSIVE METABOLIC PANEL
AST: 18 U/L (ref 0–37)
Albumin: 3.6 g/dL (ref 3.5–5.2)
Alkaline Phosphatase: 61 U/L (ref 39–117)
BUN: 18 mg/dL (ref 6–23)
Creatinine, Ser: 1.1 mg/dL (ref 0.4–1.2)
Glucose, Bld: 98 mg/dL (ref 70–99)
Potassium: 4.2 mEq/L (ref 3.5–5.1)
Total Bilirubin: 0.5 mg/dL (ref 0.3–1.2)

## 2012-03-03 LAB — CBC WITH DIFFERENTIAL/PLATELET
Basophils Relative: 0.3 % (ref 0.0–3.0)
Eosinophils Absolute: 0.2 10*3/uL (ref 0.0–0.7)
Eosinophils Relative: 2.2 % (ref 0.0–5.0)
HCT: 41.3 % (ref 36.0–46.0)
Hemoglobin: 13.3 g/dL (ref 12.0–15.0)
MCHC: 32.3 g/dL (ref 30.0–36.0)
MCV: 96 fl (ref 78.0–100.0)
Monocytes Absolute: 0.4 10*3/uL (ref 0.1–1.0)
Neutro Abs: 6 10*3/uL (ref 1.4–7.7)
Neutrophils Relative %: 71.9 % (ref 43.0–77.0)
RBC: 4.3 Mil/uL (ref 3.87–5.11)
WBC: 8.4 10*3/uL (ref 4.5–10.5)

## 2012-03-03 LAB — LIPID PANEL
Cholesterol: 159 mg/dL (ref 0–200)
HDL: 53.5 mg/dL (ref >39.00)
LDL Cholesterol: 73 mg/dL (ref 0–99)
Triglycerides: 164 mg/dL — ABNORMAL HIGH (ref 0.0–149.0)
VLDL: 32.8 mg/dL (ref 0.0–40.0)

## 2012-03-03 LAB — POCT INR: INR: 2.2

## 2012-03-03 NOTE — Patient Instructions (Signed)
mg daily ( will check in 1 week while seeing Dr Linward Natal increase back to 7.5mg  1 day a week ) Patient still not eating much

## 2012-03-09 ENCOUNTER — Ambulatory Visit (INDEPENDENT_AMBULATORY_CARE_PROVIDER_SITE_OTHER): Payer: Medicare HMO | Admitting: Family Medicine

## 2012-03-09 ENCOUNTER — Encounter: Payer: Self-pay | Admitting: Family Medicine

## 2012-03-09 ENCOUNTER — Ambulatory Visit: Payer: Medicare HMO

## 2012-03-09 VITALS — BP 132/74 | HR 84 | Temp 98.2°F | Ht <= 58 in | Wt 173.8 lb

## 2012-03-09 DIAGNOSIS — F319 Bipolar disorder, unspecified: Secondary | ICD-10-CM

## 2012-03-09 DIAGNOSIS — R269 Unspecified abnormalities of gait and mobility: Secondary | ICD-10-CM

## 2012-03-09 DIAGNOSIS — Z86718 Personal history of other venous thrombosis and embolism: Secondary | ICD-10-CM

## 2012-03-09 DIAGNOSIS — Z5181 Encounter for therapeutic drug level monitoring: Secondary | ICD-10-CM

## 2012-03-09 DIAGNOSIS — Z79899 Other long term (current) drug therapy: Secondary | ICD-10-CM

## 2012-03-09 DIAGNOSIS — R432 Parageusia: Secondary | ICD-10-CM | POA: Insufficient documentation

## 2012-03-09 DIAGNOSIS — Z7901 Long term (current) use of anticoagulants: Secondary | ICD-10-CM

## 2012-03-09 DIAGNOSIS — R2689 Other abnormalities of gait and mobility: Secondary | ICD-10-CM

## 2012-03-09 DIAGNOSIS — R439 Unspecified disturbances of smell and taste: Secondary | ICD-10-CM

## 2012-03-09 DIAGNOSIS — Z0000 Encounter for general adult medical examination without abnormal findings: Secondary | ICD-10-CM

## 2012-03-09 DIAGNOSIS — Z1231 Encounter for screening mammogram for malignant neoplasm of breast: Secondary | ICD-10-CM

## 2012-03-09 DIAGNOSIS — E785 Hyperlipidemia, unspecified: Secondary | ICD-10-CM

## 2012-03-09 DIAGNOSIS — I739 Peripheral vascular disease, unspecified: Secondary | ICD-10-CM

## 2012-03-09 DIAGNOSIS — E039 Hypothyroidism, unspecified: Secondary | ICD-10-CM

## 2012-03-09 DIAGNOSIS — E559 Vitamin D deficiency, unspecified: Secondary | ICD-10-CM

## 2012-03-09 LAB — SEDIMENTATION RATE: Sed Rate: 49 mm/hr — ABNORMAL HIGH (ref 0–22)

## 2012-03-09 NOTE — Assessment & Plan Note (Signed)
Chronic, stable. Continue dose.

## 2012-03-09 NOTE — Assessment & Plan Note (Signed)
Has completed HHPT for balance training and refused outpt PT.   HHPT provided pt with HEP (which she is not doing) and recommended she use cane (currently not using). Emphasized today h/o falls and risks of complications given continued coumadin. Advised start using cane and doing HEP. Pt continues to decline outpt PT. Sounds like psych is decreasing klonopin which I agree with and had tried in past - hopeful to decrease fall risk with coming off benzo.

## 2012-03-09 NOTE — Assessment & Plan Note (Signed)
Improved. Continue vit D 2000 IU daily.  H/o osteopenia.

## 2012-03-09 NOTE — Assessment & Plan Note (Addendum)
I have personally reviewed the Medicare Annual Wellness questionnaire and have noted 1. The patient's medical and social history 2. Their use of alcohol, tobacco or illicit drugs 3. Their current medications and supplements 4. The patient's functional ability including ADL's, fall risks, home safety risks and hearing or visual impairment. 5. Diet and physical activity 6. Evidence for depression or mood disorders The patients weight, height, BMI have been recorded in the chart.  Hearing and vision has been addressed. I have made referrals, counseling and provided education to the patient based review of the above and I have provided the pt with a written personalized care plan for preventive services. See scanned questionairre. Advanced directives discussed: will mail advanced directives packet as forgot to provide in office.  Reviewed preventative protocols and updated unless pt declined. Schedule mammogram. Recent vision exam normal per pt. Consider restarting iFOB next visit as no records of colonoscopy.

## 2012-03-09 NOTE — Assessment & Plan Note (Signed)
New complaint but per pt longstanding along with dry mouth. H/o dysphagia in past, pt had declined eval. Check blood work today including eval for sjogren's syndrome.

## 2012-03-09 NOTE — Patient Instructions (Signed)
Increase to 5 mg daily , except 7.5 Tues, recheck 2 weeks

## 2012-03-09 NOTE — Assessment & Plan Note (Signed)
rec f/u with psych for this.

## 2012-03-09 NOTE — Progress Notes (Signed)
Subjective:    Patient ID: Kathryn Mathews, female    DOB: May 30, 1946, 66 y.o.   MRN: [id number]  HPI CC: medicare wellness visit  Last appt with psychiatry was 2 wks ago.  Followed by Dr. Imogene Burn for anxiety and depression - told bipolar.  Started on lithium but pt stopped this 2/2 worsened balance and other intolerable side effects.  Was on lithium for 3 weeks total.  Has questions about psychotropic meds.  Sounds like klonopin is being decreased.  has f/u appt with psych 03/24/2012.  Advised keep appt.  Wt Readings from Last 3 Encounters:  03/09/12 173 lb 12 oz (78.812 kg)  10/01/11 176 lb 8 oz (80.06 kg)  09/18/11 177 lb 4 oz (80.4 kg)  Waking up nauseated in am, lasts .  No vomiting.  Appetite stable.  No BM changes.  Has lost taste for food and drinks.  Significant dry mouth as well.  Longstanding issue, going on for several months.  Continued balance issues - has had problems since move to [place].  No falls in last month.  Did go through balance training program with [facility] 10/2011.  Declined outpatient physical therapy.  Has not been doing exercises recommended (HEP).  Does not have fitting shower chair at home.  Preventative:  Colonoscopy - ~2010. Told ok.  Mammogram - last normal 02/2011.  Agrees to schedule mammogram again.  Declines breast exam today, does not do breast exams at home. Pap smear - decided against doing years ago. Always normal in past. No problems with spotting, not sexually active.  Tetanus - 07/2011 zostavax 08/2011 Pneumovax 02/2011 DEXA 2010 - osteopenia, femoral -2.1. Vit D low at 17 this check. Living will - states "if I can't think, just let me go". Thinks husband knows, would want husband then son to make decisions, but nothing formal yet. Hasn't spoken with son about this. Would like to set up living will - requests advanced directive packet.  Passes vision screen.  Eye exam 02/2012 Passes hearing screen today.  Medications and allergies reviewed and  updated in chart.  Past histories reviewed and updated if relevant as below. Patient Active Problem List  Diagnosis  . Anxiety and depression  . GERD (gastroesophageal reflux disease)  . HLD (hyperlipidemia)  . Hypothyroid  . History of DVT (deep vein thrombosis)  . PAD (peripheral artery disease)  . Osteopenia  . Medicare annual wellness visit, initial  . Vitamin D deficiency  . Cerumen impaction  . Dysphagia  . Falls  . Bronchitis   Past Medical History  Diagnosis Date  . History of chicken pox   . Bipolar disorder     seeing psych Imogene Burn)  . GERD (gastroesophageal reflux disease)   . HLD (hyperlipidemia)   . Hypothyroid     previously hyperthyroid  . History of DVT (deep vein thrombosis)     on chronic coumadin since 1973, h/o blood clots on coumadin 2011, added ASA QD, but due to fall risk stopped and changed goal INR to 2.5-3.0  . Phlebitis     left leg stays swollen  . PAD (peripheral artery disease)   . Alopecia     well controlled per previous PCP  . Chronic sinusitis   . Glucose intolerance (impaired glucose tolerance)   . Closed right humeral fracture 2011    coracoid nondisplaced, conservative therapy (Dr. Cleon Gustin)  . Closed left humeral fracture 2010    closed left humeral neck fracture, also h/o L wrist fx  . Hyperactivity  of bladder   . Osteopenia 2010    DEXA 2010: T score -1.3 L spine, -2.1 femoral neck  . Falls     completed HHPT 10/2011, declined outpt PT  . Anxiety     when around ppl   Past Surgical History  Procedure Date  . Cholecystectomy   . Cesarean section 1973  . Arterial thrombectomy 07/2009    with R mid popliteal artery angioplasty [balloon] (2011)[[[  . Hospitalization 01/2007    depression, ECT   History  Substance Use Topics  . Smoking status: Never Smoker   . Smokeless tobacco: Never Used  . Alcohol Use: No   Family History  Problem Relation Age of Onset  . Coronary artery disease Mother 34  . Hyperlipidemia Mother   .  Heart disease Mother   . Hypertension Mother   . Breast cancer Maternal Aunt 54  . Coronary artery disease Maternal Uncle   . Coronary artery disease Maternal Grandmother 70  . Stroke Neg Hx   . Diabetes Neg Hx   . Cancer Neg Hx    Allergies  Allergen Reactions  . Codeine Nausea And Vomiting   Current Outpatient Prescriptions on File Prior to Visit  Medication Sig Dispense Refill  . atorvastatin (LIPITOR) 10 MG tablet Take 1 tablet (10 mg total) by mouth at bedtime.  90 tablet  3  . Biotin 1000 MCG tablet Take 1,000 mcg by mouth 2 (two) times daily.        Marland Kitchen buPROPion (WELLBUTRIN SR) 150 MG 12 hr tablet Take 1 tablet (150 mg total) by mouth 2 (two) times daily.  180 tablet  3  . Cholecalciferol (VITAMIN D) 2000 UNITS CAPS Take 1 capsule (2,000 Units total) by mouth daily.      . clonazePAM (KLONOPIN) 0.5 MG tablet TAKE 2 TABLETS TWICE DAILY  120 tablet  3  . clotrimazole-betamethasone (LOTRISONE) cream Apply topically 2 (two) times daily. No more than 2 weeks at a time.  45 g  0  . doxepin (SINEQUAN) 50 MG capsule Take 1 capsule (50 mg total) by mouth at bedtime.  90 capsule  3  . lamoTRIgine (LAMICTAL) 25 MG tablet Take 100 mg by mouth daily.       Marland Kitchen levothyroxine (SYNTHROID, LEVOTHROID) 50 MCG tablet Take 1 tablet (50 mcg total) by mouth daily.  90 tablet  3  . warfarin (COUMADIN) 5 MG tablet Take 1 tablet (5 mg total) by mouth daily.  90 tablet  3     Review of Systems  Constitutional: Negative for fever, chills, activity change, appetite change, fatigue and unexpected weight change.  HENT: Negative for hearing loss and neck pain.   Eyes: Negative for visual disturbance.  Respiratory: Negative for cough, chest tightness, shortness of breath and wheezing.   Cardiovascular: Negative for chest pain, palpitations and leg swelling.  Gastrointestinal: Negative for nausea, vomiting, abdominal pain, diarrhea, constipation, blood in stool and abdominal distention.  Genitourinary: Negative  for hematuria and difficulty urinating.  Musculoskeletal: Negative for myalgias and arthralgias.  Skin: Negative for rash.  Neurological: Negative for dizziness, seizures, syncope and headaches.  Hematological: Does not bruise/bleed easily.  Psychiatric/Behavioral: Negative for dysphoric mood. The patient is not nervous/anxious.        Objective:   Physical Exam  Nursing note and vitals reviewed. Constitutional: She is oriented to person, place, and time. She appears well-developed and well-nourished. No distress.  HENT:  Head: Normocephalic and atraumatic.  Right Ear: Hearing, tympanic membrane, external ear  and ear canal normal.  Left Ear: Hearing, tympanic membrane, external ear and ear canal normal.  Nose: Nose normal.  Mouth/Throat: Oropharynx is clear and moist. No oropharyngeal exudate.  Eyes: Conjunctivae and EOM are normal. Pupils are equal, round, and reactive to light. No scleral icterus.  Neck: Normal range of motion. Neck supple. Carotid bruit is not present.  Cardiovascular: Normal rate, regular rhythm, normal heart sounds and intact distal pulses.   No murmur heard. Pulses:      Radial pulses are 2+ on the right side, and 2+ on the left side.  Pulmonary/Chest: Effort normal and breath sounds normal. No respiratory distress. She has no wheezes. She has no rales.       Breast: declined  Abdominal: Soft. Bowel sounds are normal. She exhibits no distension and no mass. There is no tenderness. There is no rebound and no guarding.  Genitourinary:       deferred  Musculoskeletal: Normal range of motion. She exhibits no edema.  Lymphadenopathy:    She has no cervical adenopathy.  Neurological: She is alert and oriented to person, place, and time.       CN grossly intact, station and gait intact  Skin: Skin is warm and dry. No rash noted.  Psychiatric: She has a normal mood and affect. Her behavior is normal. Judgment and thought content normal.      Assessment & Plan:

## 2012-03-09 NOTE — Assessment & Plan Note (Signed)
Well controlled on low dose lipitor.  Trig slightly elevated.  No changes today.  Continue to monitor.

## 2012-03-09 NOTE — Assessment & Plan Note (Signed)
On aspirin and coumadin 2/2 h/o DVT on therapeutic coumadin in past.  Stopped aspirin 2/2 recurrent falls and bleed risk, changed coumadin to higher therapeutic range.

## 2012-03-09 NOTE — Patient Instructions (Addendum)
Pass by Kathryn Mathews's office to schedule mammogram. Start home exercise program previously recommended.  If not improved imbalance, I recommend outpatient physical therapy. Start using cane! Look into better fitting shower chair. Check INR today. We will check some more labs for loss of taste. Good to see you today, call us with questions.

## 2012-03-10 LAB — ANTI-NUCLEAR AB-TITER (ANA TITER): ANA Titer 1: 1:40 {titer} — ABNORMAL HIGH

## 2012-03-12 LAB — SJOGRENS SYNDROME-B EXTRACTABLE NUCLEAR ANTIBODY: SSB (La) (ENA) Antibody, IgG: <1 AU/mL (ref <30)

## 2012-03-12 LAB — SJOGRENS SYNDROME-A EXTRACTABLE NUCLEAR ANTIBODY: SSA (Ro) (ENA) Antibody, IgG: 4 AU/mL (ref <30)

## 2012-03-18 ENCOUNTER — Other Ambulatory Visit: Payer: Self-pay | Admitting: Family Medicine

## 2012-03-23 ENCOUNTER — Ambulatory Visit
Admission: RE | Admit: 2012-03-23 | Discharge: 2012-03-23 | Disposition: A | Discharge status: Home / Self Care | Payer: Medicare HMO | Source: Ambulatory Visit | Attending: Family Medicine | Admitting: Family Medicine

## 2012-03-23 ENCOUNTER — Encounter: Payer: Self-pay | Admitting: *Deleted

## 2012-03-23 ENCOUNTER — Ambulatory Visit (INDEPENDENT_AMBULATORY_CARE_PROVIDER_SITE_OTHER): Payer: Medicare HMO | Admitting: Family Medicine

## 2012-03-23 DIAGNOSIS — I739 Peripheral vascular disease, unspecified: Secondary | ICD-10-CM

## 2012-03-23 DIAGNOSIS — Z1231 Encounter for screening mammogram for malignant neoplasm of breast: Secondary | ICD-10-CM

## 2012-03-23 DIAGNOSIS — Z5181 Encounter for therapeutic drug level monitoring: Secondary | ICD-10-CM

## 2012-03-23 DIAGNOSIS — Z7901 Long term (current) use of anticoagulants: Secondary | ICD-10-CM

## 2012-03-23 NOTE — Patient Instructions (Addendum)
Increase 5 mg daily , except 7.5 Tues and Fri recheck 2 weeks

## 2012-03-25 ENCOUNTER — Ambulatory Visit: Payer: Medicare HMO

## 2012-04-06 ENCOUNTER — Ambulatory Visit (INDEPENDENT_AMBULATORY_CARE_PROVIDER_SITE_OTHER): Payer: Medicare HMO | Admitting: Family Medicine

## 2012-04-06 DIAGNOSIS — I739 Peripheral vascular disease, unspecified: Secondary | ICD-10-CM

## 2012-04-06 NOTE — Patient Instructions (Addendum)
continue 5 mg daily , except 7.5 Tues and Fri recheck 4 weeks

## 2012-04-07 ENCOUNTER — Telehealth: Payer: Self-pay | Admitting: *Deleted

## 2012-04-07 NOTE — Telephone Encounter (Signed)
Pt came in for a protime with a booklet from her insurance company [employer]. She stated she spoke with her insurance company and they told her that she should not have a copay when having her protime's checked. She showed me the booklet and I made a copy of the page she was referring to. I advised her I would give it to a manager for review and we would contact her once we found out any news.

## 2012-05-04 ENCOUNTER — Ambulatory Visit (INDEPENDENT_AMBULATORY_CARE_PROVIDER_SITE_OTHER): Payer: Medicare HMO | Admitting: *Deleted

## 2012-05-04 ENCOUNTER — Ambulatory Visit (INDEPENDENT_AMBULATORY_CARE_PROVIDER_SITE_OTHER): Payer: Medicare HMO | Admitting: Family Medicine

## 2012-05-04 DIAGNOSIS — Z5181 Encounter for therapeutic drug level monitoring: Secondary | ICD-10-CM

## 2012-05-04 DIAGNOSIS — Z7901 Long term (current) use of anticoagulants: Secondary | ICD-10-CM

## 2012-05-04 DIAGNOSIS — I739 Peripheral vascular disease, unspecified: Secondary | ICD-10-CM

## 2012-05-04 DIAGNOSIS — Z23 Encounter for immunization: Secondary | ICD-10-CM

## 2012-05-04 LAB — POCT INR: INR: 4.2

## 2012-05-04 NOTE — Patient Instructions (Signed)
Hold x 2 days. 5 mg daily , except 7.5 Tues ( reduced 2.5 mg today from regular dose schedule )recheck 1 week

## 2012-05-14 ENCOUNTER — Ambulatory Visit (INDEPENDENT_AMBULATORY_CARE_PROVIDER_SITE_OTHER): Payer: Medicare HMO | Admitting: Family Medicine

## 2012-05-14 DIAGNOSIS — Z5181 Encounter for therapeutic drug level monitoring: Secondary | ICD-10-CM

## 2012-05-14 DIAGNOSIS — I739 Peripheral vascular disease, unspecified: Secondary | ICD-10-CM

## 2012-05-14 DIAGNOSIS — Z7901 Long term (current) use of anticoagulants: Secondary | ICD-10-CM

## 2012-05-14 NOTE — Patient Instructions (Signed)
Continue current dose, check in 4 weeks  

## 2012-05-28 ENCOUNTER — Ambulatory Visit (INDEPENDENT_AMBULATORY_CARE_PROVIDER_SITE_OTHER): Payer: Medicare HMO | Admitting: Family Medicine

## 2012-05-28 DIAGNOSIS — I739 Peripheral vascular disease, unspecified: Secondary | ICD-10-CM

## 2012-05-28 DIAGNOSIS — Z7901 Long term (current) use of anticoagulants: Secondary | ICD-10-CM

## 2012-05-28 DIAGNOSIS — Z5181 Encounter for therapeutic drug level monitoring: Secondary | ICD-10-CM

## 2012-05-28 LAB — POCT INR: INR: 2.8

## 2012-05-28 NOTE — Patient Instructions (Signed)
Continue current dose, check in 4 weeks  

## 2012-06-24 ENCOUNTER — Ambulatory Visit (INDEPENDENT_AMBULATORY_CARE_PROVIDER_SITE_OTHER): Payer: Medicare HMO | Admitting: General Practice

## 2012-06-24 ENCOUNTER — Ambulatory Visit: Payer: Medicare HMO

## 2012-06-24 DIAGNOSIS — I739 Peripheral vascular disease, unspecified: Secondary | ICD-10-CM

## 2012-07-15 ENCOUNTER — Encounter: Payer: Self-pay | Admitting: Family Medicine

## 2012-07-15 ENCOUNTER — Telehealth: Payer: Self-pay | Admitting: Family Medicine

## 2012-07-15 ENCOUNTER — Ambulatory Visit (INDEPENDENT_AMBULATORY_CARE_PROVIDER_SITE_OTHER): Payer: Medicare HMO | Admitting: Family Medicine

## 2012-07-15 ENCOUNTER — Ambulatory Visit (INDEPENDENT_AMBULATORY_CARE_PROVIDER_SITE_OTHER)
Admission: RE | Admit: 2012-07-15 | Discharge: 2012-07-15 | Disposition: A | Discharge status: Home / Self Care | Payer: Medicare HMO | Source: Ambulatory Visit | Attending: Family Medicine | Admitting: Family Medicine

## 2012-07-15 VITALS — BP 122/80 | HR 84 | Temp 98.7°F | Wt 162.0 lb

## 2012-07-15 DIAGNOSIS — M4310 Spondylolisthesis, site unspecified: Secondary | ICD-10-CM

## 2012-07-15 DIAGNOSIS — M545 Low back pain: Secondary | ICD-10-CM

## 2012-07-15 DIAGNOSIS — S32010A Wedge compression fracture of first lumbar vertebra, initial encounter for closed fracture: Secondary | ICD-10-CM

## 2012-07-15 DIAGNOSIS — T148XXA Other injury of unspecified body region, initial encounter: Secondary | ICD-10-CM | POA: Insufficient documentation

## 2012-07-15 DIAGNOSIS — S32009A Unspecified fracture of unspecified lumbar vertebra, initial encounter for closed fracture: Secondary | ICD-10-CM

## 2012-07-15 MED ORDER — HYDROCODONE-ACETAMINOPHEN 5-325 MG PO TABS: 1.0000 | ORAL_TABLET | Freq: Three times a day (TID) | ORAL | Status: DC | PRN

## 2012-07-15 NOTE — Telephone Encounter (Signed)
Please notify patient - xray returned showing compression fracture of lower back at L1 as well as some displacement of spine - unsure if this is from fall or was present previously. I do recommend evaluation by spine doctor to see if he recommends any further intervention.  Have placed referral in chart. Also continue tylenol for pain, may call in vicodin for pt if desired for breakthrough pain - continue to recommend avoiding NSAIDs.

## 2012-07-15 NOTE — Assessment & Plan Note (Deleted)
Given age and fall sustained, obtained lumbar spine films to r/o compression fracture There is an L1 compression fracture as well as grade III spondylolisthesis with 1.9cm anterolisthesis. Will refer to back surgery for further eval and discussion of options. DEXA 2010 with osteopenia with T score -1.3 at Lspine.

## 2012-07-15 NOTE — Assessment & Plan Note (Addendum)
Given age and fall sustained, obtained lumbar spine films to r/o compression fracture There is an L1 compression fracture as well as grade III spondylolisthesis with 1.9cm anterolisthesis. Will refer to back surgery for further eval and discussion of options. Recommended regular tylenol use for next several weeks.  Will add narcotic if pt interested for breakthrough pain. DEXA 2010 with osteopenia with T score -1.3 at Lspine.

## 2012-07-15 NOTE — Progress Notes (Signed)
  Subjective:    Patient ID: Kathryn Mathews, female    DOB: January 11, 1946, 67 y.o.   MRN: [id number]  HPI CC: falls, check bruising  Had mechanical fall 3 wks ago - was on 4th step of step ladder and while lifting heavy box of photos into high cabinet lost her footing and fell down.  Landed on back on carpeted floor.  Didn't hit head.  Denies LOC.  Denies dizziness, lightheadedness.  Denies significant bruising from fall.  Back has been hurting since.  Worse pain while standing located on right side of back and midline as well.  A few nights ago had sharp right sided abd pain that improved with heat.  This pain has since resolved.  Has been having more bruising recently - denies bumping into things.  On coumadin and therapeutic last check.  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  New INR goal is 2.5-3.0 Lab Results  Component Value Date   INR 2.7 06/24/2012   INR 2.8 05/28/2012   INR 2.8 05/14/2012   Wt Readings from Last 3 Encounters:  07/15/12 162 lb (73.483 kg)  03/09/12 173 lb 12 oz (78.812 kg)  10/01/11 176 lb 8 oz (80.06 kg)    Past Medical History  Diagnosis Date  . History of chicken pox   . Bipolar disorder     seeing psych Imogene Burn)  . GERD (gastroesophageal reflux disease)   . HLD (hyperlipidemia)   . Hypothyroid     previously hyperthyroid  . History of DVT (deep vein thrombosis)     on chronic coumadin since 1973, h/o blood clots on coumadin 2011, added ASA QD, but due to fall risk stopped and changed goal INR to 2.5-3.0  . Phlebitis     left leg stays swollen  . PAD (peripheral artery disease)   . Alopecia     well controlled per previous PCP  . Chronic sinusitis   . Glucose intolerance (impaired glucose tolerance)   . Closed right humeral fracture 2011    coracoid nondisplaced, conservative therapy (Dr. Cleon Gustin)  . Closed left humeral fracture 2010    closed left humeral neck fracture, also h/o L wrist fx  . Hyperactivity of bladder   . Osteopenia 2010    DEXA  2010: T score -1.3 L spine, -2.1 femoral neck  . Falls     completed HHPT 10/2011, declined outpt PT  . Anxiety     when around ppl    Review of Systems     Objective:   Physical Exam  Nursing note and vitals reviewed. Constitutional: She appears well-developed and well-nourished. No distress.  HENT:  Head: Normocephalic and atraumatic.  Mouth/Throat: Oropharynx is clear and moist. No oropharyngeal exudate.  Cardiovascular: Normal rate, regular rhythm, normal heart sounds and intact distal pulses.   No murmur heard. Pulmonary/Chest: Effort normal and breath sounds normal. No respiratory distress. She has no wheezes. She has no rales.  Musculoskeletal: She exhibits no edema.       Tender to palpation midline lumbar spine. No paraspinous mm tenderness. Neg SLR bilaterally. No pain with palpation of GTB, SIJ or sciatic notch bilaterally.  Skin: Skin is warm and dry. Bruising noted. No rash noted.       Different aged bruises on bilateral forearms  Psychiatric: She has a normal mood and affect.       Assessment & Plan:

## 2012-07-15 NOTE — Patient Instructions (Signed)
Good to see you today. Let's check xray of lower back today. Use tylenol for discomfort as needed - 500-1000mg  twice daily as needed. If continued pain despite this after 2-3 weeks, please let me know.

## 2012-07-15 NOTE — Assessment & Plan Note (Signed)
Presumed 2/2 coumadin.  Reassured.  Goal INR remains 2.5-3 given h/o clot when previously therapeutic INR.

## 2012-07-16 NOTE — Telephone Encounter (Signed)
Message left for patient to return my call.  

## 2012-07-16 NOTE — Telephone Encounter (Signed)
Reached the patient on husbands cell phone, Appt made with Dr Estill Bamberg on 07/19/2012 at [facility], all records were faxed. And patient aware of appt.

## 2012-07-26 ENCOUNTER — Ambulatory Visit (INDEPENDENT_AMBULATORY_CARE_PROVIDER_SITE_OTHER): Payer: Medicare HMO | Admitting: General Practice

## 2012-07-26 DIAGNOSIS — I739 Peripheral vascular disease, unspecified: Secondary | ICD-10-CM

## 2012-07-27 ENCOUNTER — Ambulatory Visit: Payer: Medicare HMO

## 2012-08-02 ENCOUNTER — Telehealth: Payer: Self-pay | Admitting: Family Medicine

## 2012-08-02 NOTE — Telephone Encounter (Addendum)
Can we give her a call later for update - and see how constipation is.  If not improved, will recommend she take miralax 1 capful in 8 oz water daily.

## 2012-08-02 NOTE — Telephone Encounter (Signed)
Patient Information:  Caller Name: Wilda  Phone: [phone]  Patient: Tanicia, Wolaver  Gender: Female  DOB: 07/28/1945  Age: 67 Years  PCP: Eustaquio Boyden [facility])  Office Follow Up:  Does the office need to follow up with this patient?: No  Instructions For The Office: N/A  RN Note:  Advised to call Orthopedic office now  Symptoms  Reason For Call & Symptoms: Stomachache and gas, constant pain. Larey Seat off of step ladder onto back on 06/26/13 and MRI showed compression fracture. A few days after starting Hydrocodone 5/325 1 PO q 4 hours prn pain she started with sharp pains in her stomach. Allergy to Codeine. Hydrocodone prescribed by Orthopedic MD- advised to call and check to see if he would like her to take something else for pain. She is only having bm every 2-3 days if she takes stool softener. Constipation Protocol referenced. Advised stir in fiber and to take stool softener daily.  Reviewed Health History In EMR: Yes  Reviewed Medications In EMR: Yes  Reviewed Allergies In EMR: Yes  Reviewed Surgeries / Procedures: Yes  Date of Onset of Symptoms: 07/02/2012  Treatments Tried: Stool Softener  Treatments Tried Worked: No  Guideline(s) Used:  Constipation  Abdominal Pain - Female  Disposition Per Guideline:   Go to ED Now (or to Office with PCP Approval)  Reason For Disposition Reached:   Constant abdominal pain lasting > 2 hours  Advice Given:  N/A  Patient Refused Recommendation:  Patient Refused Care Advice  She will call Orthopedic office that prescribed Hydrocodone

## 2012-08-03 ENCOUNTER — Encounter: Payer: Self-pay | Admitting: Orthopedic Surgery

## 2012-08-03 NOTE — Telephone Encounter (Signed)
Spoke with pt constipation has been resolved; pt having normal BM. Pt will call back if further problem.

## 2012-08-03 NOTE — Telephone Encounter (Signed)
Noted. Thanks.

## 2012-08-09 ENCOUNTER — Ambulatory Visit (INDEPENDENT_AMBULATORY_CARE_PROVIDER_SITE_OTHER): Payer: Medicare HMO | Admitting: Family Medicine

## 2012-08-09 ENCOUNTER — Ambulatory Visit (INDEPENDENT_AMBULATORY_CARE_PROVIDER_SITE_OTHER)
Admission: RE | Admit: 2012-08-09 | Discharge: 2012-08-09 | Disposition: A | Discharge status: Home / Self Care | Payer: Medicare HMO | Source: Ambulatory Visit | Attending: Family Medicine | Admitting: Family Medicine

## 2012-08-09 ENCOUNTER — Encounter: Payer: Self-pay | Admitting: Family Medicine

## 2012-08-09 VITALS — BP 128/86 | HR 76 | Temp 98.2°F | Wt 162.0 lb

## 2012-08-09 DIAGNOSIS — R109 Unspecified abdominal pain: Secondary | ICD-10-CM | POA: Insufficient documentation

## 2012-08-09 MED ORDER — POLYETHYLENE GLYCOL 3350 17 GM/SCOOP PO POWD: 17.0000 g | Freq: Two times a day (BID) | ORAL | Status: DC | PRN

## 2012-08-09 NOTE — Patient Instructions (Signed)
I do think this is likely from constipation. Try to minimize hydrocodone use. Let's start miralax 1 capful in 8 oz fluid twice daily for next 5 days, along with increased water intake. Continue colace. If not better with this after 2-3 days, call me for CT scan of abdomen. If at any point worsening abdominal pain or you stop passing gas, please seek urgent care.

## 2012-08-09 NOTE — Progress Notes (Signed)
  Subjective:    Patient ID: Kathryn Mathews, female    DOB: 01/15/1946, 67 y.o.   MRN: [id number]  HPI CC: constipation, hemorrhoids  Seen here 07/15/2012 with dx L1 compression fracture, referred to spine for further eval/management.  Pt was started on hydrocodone/acetaminophen, and started on physical therapy.  Also using muscle relaxant.  Back is doing better.  Since then, has been battling with constipation.  Also with "pain at belly button" present for last 4 days.  Has feeling of something present at umbilicus.  Has recently been straining more than normal - 2/2 constipation.  Has had hernia in past  Constipation - last good BM was about 1 wk ago.  Took laxative -> liquid stool.  Has had small amt brown stool since then, last was yesterday.  Passed gas yesterday.  Less flatus recently.  Appetite down.  Adding fiber to diet, added stool softener colace.  More water as well.  Some hemorrhoid issues as well - tender.  No bleeding.  Hemorrhoid cream helps.  Lab Results  Component Value Date   INR 2.8 07/26/2012   INR 2.7 06/24/2012   INR 2.8 05/28/2012    Wt Readings from Last 3 Encounters:  08/09/12 162 lb (73.483 kg)  07/15/12 162 lb (73.483 kg)  03/09/12 173 lb 12 oz (78.812 kg)    Past Medical History  Diagnosis Date  . History of chicken pox   . Bipolar disorder     seeing psych Imogene Burn)  . GERD (gastroesophageal reflux disease)   . HLD (hyperlipidemia)   . Hypothyroid     previously hyperthyroid  . History of DVT (deep vein thrombosis)     on chronic coumadin since 1973, h/o blood clots on coumadin 2011, added ASA QD, but due to fall risk stopped and changed goal INR to 2.5-3.0  . Phlebitis     left leg stays swollen  . PAD (peripheral artery disease)   . Alopecia     well controlled per previous PCP  . Chronic sinusitis   . Glucose intolerance (impaired glucose tolerance)   . Closed right humeral fracture 2011    coracoid nondisplaced, conservative therapy (Dr.  Cleon Gustin)  . Closed left humeral fracture 2010    closed left humeral neck fracture, also h/o L wrist fx  . Hyperactivity of bladder   . Osteopenia 2010    DEXA 2010: T score -1.3 L spine, -2.1 femoral neck  . Falls     completed HHPT 10/2011, declined outpt PT  . Anxiety     when around ppl   Past Surgical History  Procedure Date  . Cholecystectomy   . Cesarean section 1973  . Arterial thrombectomy 07/2009    with R mid popliteal artery angioplasty [balloon] (2011)[[[  . Hospitalization 01/2007    depression, ECT   Review of Systems Per HPI    Objective:   Physical Exam  Nursing note and vitals reviewed. Constitutional: She appears well-developed and well-nourished. No distress.  Abdominal: Soft. Bowel sounds are normal. She exhibits no distension and no mass. There is no hepatosplenomegaly. There is tenderness in the periumbilical area and suprapubic area. There is no rebound, no guarding and no CVA tenderness. Hernia confirmed negative in the right inguinal area and confirmed negative in the left inguinal area.       Tender to palpation just inferior to umbilicus, body habitus makes palpation for umbilical hernias and muscle fascial defects difficult.       Assessment & Plan:

## 2012-08-09 NOTE — Assessment & Plan Note (Addendum)
Given story, initially some concern for obstruction type picture - abdominal series without evidence of acute obstruction, benign abdominal clinical exam. On my read - elevated L hemdiaphragm, and large stool burden. Anticipate constipation causing abd discomfort. Discussed relation between narcotics and constipation and in turn constipation worsening hemorrhoids. Will treat with miralax and asked [place] to minimize narcotic use - back pain slowly improving. Discussed red flags to seek urgent care, or to notify me if no improvement for CT scan to eval umbilical hernia. Pt agrees with plan.

## 2012-08-14 ENCOUNTER — Encounter: Payer: Self-pay | Admitting: Orthopedic Surgery

## 2012-09-06 ENCOUNTER — Ambulatory Visit (INDEPENDENT_AMBULATORY_CARE_PROVIDER_SITE_OTHER): Payer: Medicare HMO | Admitting: General Practice

## 2012-09-06 DIAGNOSIS — I739 Peripheral vascular disease, unspecified: Secondary | ICD-10-CM

## 2012-09-06 DIAGNOSIS — Z5181 Encounter for therapeutic drug level monitoring: Secondary | ICD-10-CM

## 2012-09-06 DIAGNOSIS — Z7901 Long term (current) use of anticoagulants: Secondary | ICD-10-CM

## 2012-09-06 LAB — POCT INR: INR: 1.8

## 2012-09-07 ENCOUNTER — Ambulatory Visit (INDEPENDENT_AMBULATORY_CARE_PROVIDER_SITE_OTHER): Payer: Medicare HMO | Admitting: Family Medicine

## 2012-09-07 ENCOUNTER — Encounter: Payer: Self-pay | Admitting: Family Medicine

## 2012-09-07 VITALS — BP 126/78 | HR 76 | Temp 98.1°F | Wt 158.0 lb

## 2012-09-07 DIAGNOSIS — M899 Disorder of bone, unspecified: Secondary | ICD-10-CM

## 2012-09-07 DIAGNOSIS — IMO0002 Reserved for concepts with insufficient information to code with codable children: Secondary | ICD-10-CM

## 2012-09-07 DIAGNOSIS — K439 Ventral hernia without obstruction or gangrene: Secondary | ICD-10-CM

## 2012-09-07 DIAGNOSIS — R110 Nausea: Secondary | ICD-10-CM | POA: Insufficient documentation

## 2012-09-07 DIAGNOSIS — K5900 Constipation, unspecified: Secondary | ICD-10-CM | POA: Insufficient documentation

## 2012-09-07 NOTE — Assessment & Plan Note (Signed)
Consider discussion of fosamax vs evista and rpt bone density scan.

## 2012-09-07 NOTE — Patient Instructions (Addendum)
May use miralax as needed up to twice a day with plenty of fluid. I do think you have a ventral hernia - would just monitor for now.   If becoming larger or more painful, I would order CT scan and possibly surgery evaluation. Let me know if worsening pain or constipation. Good to see you today, call us with questions. Come back in 6 months for medicare wellness visit, prior fasting for blood work.

## 2012-09-07 NOTE — Assessment & Plan Note (Addendum)
Endorses improving pain - rarely using hydrocodone.  On tylenol prn.

## 2012-09-07 NOTE — Assessment & Plan Note (Signed)
Discussed use of miralax as well as colace. Denies current problem with constipation.

## 2012-09-07 NOTE — Assessment & Plan Note (Signed)
Check blood work for reversible causes of nausea.  Longstanding.

## 2012-09-07 NOTE — Assessment & Plan Note (Signed)
Anticipate ventral hernia present, causing some discomfort but not currently concerned for strangulation or incarceration. Discussed red flags to watch for, encouraged continued weight loss. Discussed if more symptomatic or enlarging I would want to obtain CT scan to better evaluate hernia. 

## 2012-09-07 NOTE — Progress Notes (Signed)
Subjective:    Patient ID: Kathryn Mathews, female    DOB: 1946-04-19, 67 y.o.   MRN: [id number]  HPI CC: abd pain  Presents with similar complaints as 08/09/2012.  At that time thought had constipation, treated with miralax.  Some concern for obstruction but KUB overall reassuring - large stool burden.  Also at that time unclear if ventral hernia present 2/2 body habitus.  Today with continued abd discomfort at umbilicus - feels hard.  Some am nausea for after waking up.  This has been going on for 2 years.  No vomiting, fevers/chills.  States constipation is better - after taking miralax only for 5 days.  1BM/day, some firm stools. No hemorrhoid issues currently. Also uses colace. No blood in stool.  Wt Readings from Last 3 Encounters:  09/07/12 158 lb (71.668 kg)  08/09/12 162 lb (73.483 kg)  07/15/12 162 lb (73.483 kg)  weight down 4 lbs.  Is trying - decreasing portion sizes.  Past Medical History  Diagnosis Date  . History of chicken pox   . Bipolar disorder     seeing psych Imogene Burn)  . GERD (gastroesophageal reflux disease)   . HLD (hyperlipidemia)   . Hypothyroid     previously hyperthyroid  . History of DVT (deep vein thrombosis)     on chronic coumadin since 1973, h/o blood clots on coumadin 2011, added ASA QD, but due to fall risk stopped and changed goal INR to 2.5-3.0  . Phlebitis     left leg stays swollen  . PAD (peripheral artery disease)   . Alopecia     well controlled per previous PCP  . Chronic sinusitis   . Glucose intolerance (impaired glucose tolerance)   . Closed right humeral fracture 2011    coracoid nondisplaced, conservative therapy (Dr. Cleon Gustin)  . Closed left humeral fracture 2010    closed left humeral neck fracture, also h/o L wrist fx  . Hyperactivity of bladder   . Osteopenia 2010    DEXA 2010: T score -1.3 L spine, -2.1 femoral neck  . Falls     completed HHPT 10/2011, declined outpt PT  . Anxiety     when around ppl   Past  Surgical History  Procedure Laterality Date  . Cholecystectomy    . Cesarean section  1973  . Arterial thrombectomy  07/2009    with R mid popliteal artery angioplasty [balloon] (2011)[[[[  . Hospitalization  01/2007    depression, ECT  . Inguinal hernia repair  1980s    x2 on right     Review of Systems Per HPI    Objective:   Physical Exam  Nursing note and vitals reviewed. Constitutional: She appears well-developed and well-nourished. No distress.  HENT:  Head: Normocephalic and atraumatic.  Mouth/Throat: Oropharynx is clear and moist. No oropharyngeal exudate.  Cardiovascular: Normal rate, regular rhythm, normal heart sounds and intact distal pulses.   No murmur heard. Pulmonary/Chest: Effort normal and breath sounds normal. No respiratory distress. She has no wheezes. She has no rales.  Abdominal: Soft. Normal appearance and bowel sounds are normal. She exhibits no distension and no mass. There is no hepatosplenomegaly. There is tenderness (mild) in the epigastric area, periumbilical area and suprapubic area. There is no rigidity, no rebound, no guarding, no CVA tenderness and negative Murphy's sign. A hernia is present. Hernia confirmed positive in the ventral area.  Obese Ventral hernia palpable above umbilicus - more evident when standing Mild tenderness to palpation at hernia.  Musculoskeletal: She exhibits no edema.  Skin: Skin is warm and dry. No rash noted.      Assessment & Plan:

## 2012-09-07 NOTE — Assessment & Plan Note (Deleted)
Anticipate ventral hernia present, causing some discomfort but not currently concerned for strangulation or incarceration. Discussed red flags to watch for, encouraged continued weight loss. Discussed if more symptomatic or enlarging I would want to obtain CT scan to better evaluate hernia.

## 2012-09-08 LAB — COMPREHENSIVE METABOLIC PANEL
ALT: 9 U/L (ref 0–35)
AST: 19 U/L (ref 0–37)
Albumin: 4 g/dL (ref 3.5–5.2)
Calcium: 10 mg/dL (ref 8.4–10.5)
Chloride: 103 mEq/L (ref 96–112)
Potassium: 4.2 mEq/L (ref 3.5–5.1)
Total Protein: 7.3 g/dL (ref 6.0–8.3)

## 2012-09-08 LAB — CBC WITH DIFFERENTIAL/PLATELET
Basophils Absolute: 0 10*3/uL (ref 0.0–0.1)
Eosinophils Absolute: 0.3 10*3/uL (ref 0.0–0.7)
Lymphocytes Relative: 28 % (ref 12.0–46.0)
MCHC: 33.3 g/dL (ref 30.0–36.0)
Neutro Abs: 3.7 10*3/uL (ref 1.4–7.7)
Neutrophils Relative %: 61.1 % (ref 43.0–77.0)
RDW: 15.5 % — ABNORMAL HIGH (ref 11.5–14.6)

## 2012-09-09 ENCOUNTER — Ambulatory Visit: Payer: Medicare HMO | Admitting: Family Medicine

## 2012-09-09 ENCOUNTER — Encounter: Payer: Self-pay | Admitting: *Deleted

## 2012-10-04 ENCOUNTER — Ambulatory Visit (INDEPENDENT_AMBULATORY_CARE_PROVIDER_SITE_OTHER): Payer: Medicare HMO | Admitting: General Practice

## 2012-10-04 ENCOUNTER — Other Ambulatory Visit: Payer: Self-pay | Admitting: *Deleted

## 2012-10-04 DIAGNOSIS — I739 Peripheral vascular disease, unspecified: Secondary | ICD-10-CM

## 2012-10-04 DIAGNOSIS — Z5181 Encounter for therapeutic drug level monitoring: Secondary | ICD-10-CM

## 2012-10-04 DIAGNOSIS — Z7901 Long term (current) use of anticoagulants: Secondary | ICD-10-CM

## 2012-10-04 MED ORDER — ATORVASTATIN CALCIUM 10 MG PO TABS: 10.0000 mg | ORAL_TABLET | Freq: Every day | ORAL | Status: DC

## 2012-10-04 MED ORDER — WARFARIN SODIUM 5 MG PO TABS: 5.0000 mg | ORAL_TABLET | Freq: Every day | ORAL | Status: DC

## 2012-10-04 MED ORDER — LEVOTHYROXINE SODIUM 50 MCG PO TABS: 50.0000 ug | ORAL_TABLET | Freq: Every day | ORAL | Status: DC

## 2012-10-05 ENCOUNTER — Other Ambulatory Visit: Payer: Self-pay | Admitting: *Deleted

## 2012-10-05 MED ORDER — WARFARIN SODIUM 5 MG PO TABS: 5.0000 mg | ORAL_TABLET | Freq: Every day | ORAL | Status: DC

## 2012-10-05 MED ORDER — ATORVASTATIN CALCIUM 10 MG PO TABS: 10.0000 mg | ORAL_TABLET | Freq: Every day | ORAL | Status: DC

## 2012-10-05 MED ORDER — LEVOTHYROXINE SODIUM 50 MCG PO TABS: 50.0000 ug | ORAL_TABLET | Freq: Every day | ORAL | Status: DC

## 2012-10-25 ENCOUNTER — Ambulatory Visit (INDEPENDENT_AMBULATORY_CARE_PROVIDER_SITE_OTHER): Payer: Medicare HMO | Admitting: General Practice

## 2012-10-25 DIAGNOSIS — I739 Peripheral vascular disease, unspecified: Secondary | ICD-10-CM

## 2012-10-25 DIAGNOSIS — Z5181 Encounter for therapeutic drug level monitoring: Secondary | ICD-10-CM

## 2012-10-25 DIAGNOSIS — Z7901 Long term (current) use of anticoagulants: Secondary | ICD-10-CM

## 2012-10-25 LAB — POCT INR: INR: 4.4

## 2012-11-01 ENCOUNTER — Other Ambulatory Visit: Payer: Self-pay | Admitting: *Deleted

## 2012-11-01 MED ORDER — BUPROPION HCL ER (SR) 150 MG PO TB12: 150.0000 mg | ORAL_TABLET | Freq: Two times a day (BID) | ORAL | Status: DC

## 2012-11-01 MED ORDER — CLOTRIMAZOLE-BETAMETHASONE 1-0.05 % EX CREA: TOPICAL_CREAM | Freq: Two times a day (BID) | CUTANEOUS | Status: DC

## 2012-11-15 ENCOUNTER — Ambulatory Visit (INDEPENDENT_AMBULATORY_CARE_PROVIDER_SITE_OTHER): Payer: Medicare HMO | Admitting: General Practice

## 2012-11-15 DIAGNOSIS — Z7901 Long term (current) use of anticoagulants: Secondary | ICD-10-CM

## 2012-11-15 DIAGNOSIS — I739 Peripheral vascular disease, unspecified: Secondary | ICD-10-CM

## 2012-11-15 DIAGNOSIS — Z5181 Encounter for therapeutic drug level monitoring: Secondary | ICD-10-CM

## 2012-11-15 LAB — POCT INR: INR: 2.6

## 2012-11-22 ENCOUNTER — Encounter: Payer: Self-pay | Admitting: Family Medicine

## 2012-11-22 ENCOUNTER — Ambulatory Visit (INDEPENDENT_AMBULATORY_CARE_PROVIDER_SITE_OTHER): Payer: Medicare HMO | Admitting: Family Medicine

## 2012-11-22 VITALS — BP 144/86 | HR 84 | Temp 98.3°F | Wt 150.5 lb

## 2012-11-22 DIAGNOSIS — E039 Hypothyroidism, unspecified: Secondary | ICD-10-CM

## 2012-11-22 DIAGNOSIS — R109 Unspecified abdominal pain: Secondary | ICD-10-CM

## 2012-11-22 LAB — COMPREHENSIVE METABOLIC PANEL
ALT: 16 U/L (ref 0–35)
AST: 29 U/L (ref 0–37)
Albumin: 4 g/dL (ref 3.5–5.2)
BUN: 13 mg/dL (ref 6–23)
CO2: 25 mEq/L (ref 19–32)
Calcium: 9.5 mg/dL (ref 8.4–10.5)
Chloride: 102 mEq/L (ref 96–112)
Potassium: 4.6 mEq/L (ref 3.5–5.1)

## 2012-11-22 LAB — POCT URINALYSIS DIPSTICK
Ketones, UA: NEGATIVE
Spec Grav, UA: 1.015
pH, UA: 7.5

## 2012-11-22 LAB — CBC WITH DIFFERENTIAL/PLATELET
Basophils Relative: 0.5 % (ref 0.0–3.0)
Eosinophils Absolute: 0.2 10*3/uL (ref 0.0–0.7)
Lymphocytes Relative: 27.1 % (ref 12.0–46.0)
MCHC: 33.5 g/dL (ref 30.0–36.0)
Monocytes Relative: 5.6 % (ref 3.0–12.0)
Neutrophils Relative %: 64 % (ref 43.0–77.0)
RBC: 4.67 Mil/uL (ref 3.87–5.11)
WBC: 6.6 10*3/uL (ref 4.5–10.5)

## 2012-11-22 MED ORDER — CLONAZEPAM 0.5 MG PO TABS: 0.5000 mg | ORAL_TABLET | Freq: Two times a day (BID) | ORAL | Status: DC | PRN

## 2012-11-22 NOTE — Assessment & Plan Note (Signed)
Chronic, stable.  Check TSH today as due. Lab Results  Component Value Date   TSH 3.19 03/03/2012

## 2012-11-22 NOTE — Assessment & Plan Note (Addendum)
Persistent abdominal pain associated with am nausea, and weight loss. Given duration of sxs (69mo +), will obtain CT scan to eval for symptomatic hernia (ventral vs umbilical) vs other intra abd complication. Recheck blood work today including lipase. UA today - overall stable. Bowel regimen seems keep bowels well regularized.

## 2012-11-22 NOTE — Progress Notes (Signed)
Subjective:    Patient ID: Kathryn Mathews, female    DOB: 01-Mar-1946, 67 y.o.   MRN: [id number]  HPI CC: abd pain  Ventral hernia worsening, pain daily.  Trouble bending over.  Notices more with standing.  Also noticed some hard spots.  She is worried about hernia enlarging or worsening.  Change in BMs lately. Takes miralax and stool softener daily.  When regular with this regimen, has 1 soft stool/day.    No blood in stool.  Denies fevers/chills, vomiting, diarrhea. Some am nausea for after waking up. This has been going on for 2 years. Minimizing narcotic meds - using tylenol when able to control abd discomfort.  Colonoscopy - done 3 yrs ago per patient normal.  [place] in [place].  No records of this.  We will request today.  Started feeling worse since son's dog jumped on pt and hit her at lower abdomen. Lab Results  Component Value Date   INR 2.6 11/15/2012   INR 4.4 10/25/2012   INR 4.2 10/04/2012   Wt Readings from Last 3 Encounters:  11/22/12 150 lb 8 oz (68.266 kg)  09/07/12 158 lb (71.668 kg)  08/09/12 162 lb (73.483 kg)  weight loss noted.  Has been trying to lose weight. Cutting back on portion sizes.  No regular exercise.    Past Medical History  Diagnosis Date  . History of chicken pox   . Bipolar disorder     seeing psych Imogene Burn)  . GERD (gastroesophageal reflux disease)   . HLD (hyperlipidemia)   . Hypothyroid     previously hyperthyroid  . History of DVT (deep vein thrombosis)     on chronic coumadin since 1973, h/o blood clots on coumadin 2011, added ASA QD, but due to fall risk stopped and changed goal INR to 2.5-3.0  . Phlebitis     left leg stays swollen  . PAD (peripheral artery disease)   . Alopecia     well controlled per previous PCP  . Chronic sinusitis   . Glucose intolerance (impaired glucose tolerance)   . Closed right humeral fracture 2011    coracoid nondisplaced, conservative therapy (Dr. Cleon Gustin)  . Closed left humeral fracture 2010   closed left humeral neck fracture, also h/o L wrist fx  . Hyperactivity of bladder   . Osteopenia 2010    DEXA 2010: T score -1.3 L spine, -2.1 femoral neck  . Falls     completed HHPT 10/2011, declined outpt PT  . Anxiety     when around ppl   Past Surgical History  Procedure Laterality Date  . Cholecystectomy    . Cesarean section  1973  . Arterial thrombectomy  07/2009    with R mid popliteal artery angioplasty [balloon] (2011)[[[[  . Hospitalization  01/2007    depression, ECT  . Inguinal hernia repair  1980s    x2 on right    Review of Systems Per HPI    Objective:   Physical Exam  Nursing note and vitals reviewed. Constitutional: She appears well-developed and well-nourished. No distress.  HENT:  Mouth/Throat: Oropharynx is clear and moist. No oropharyngeal exudate.  Eyes: Conjunctivae and EOM are normal. Pupils are equal, round, and reactive to light. No scleral icterus.  Puffy under eyes periorbitally  Cardiovascular: Normal rate, regular rhythm and intact distal pulses.   Murmur (2/6 SEM at LUSB) heard. Pulmonary/Chest: Effort normal and breath sounds normal. No respiratory distress. She has no wheezes. She has no rales.  Abdominal: Soft. Normal  appearance and bowel sounds are normal. She exhibits no distension. There is no hepatosplenomegaly. There is tenderness (LLQ, suprapubic) in the periumbilical area, suprapubic area and left lower quadrant. There is no rigidity, no rebound, no guarding, no CVA tenderness and negative Murphy's sign. A hernia is present. Hernia confirmed positive in the ventral area.  Tender to palpation mainly mid abdomen at presumed site of ventral hernia, some hardening of inferior abdominal wall.    Musculoskeletal: She exhibits no edema.  Skin: Skin is warm and dry. No rash noted.  Psychiatric: She has a normal mood and affect.       Assessment & Plan:

## 2012-11-22 NOTE — Patient Instructions (Addendum)
Look at Dr. Evelene Croon or Dr. Toni Amend for psychiatrists. I've refilled klonopin 0.5mg  twice daily to take as needed. Pass by Marion's office to schedule CT scan. Blood work today.  Urine checked today. Good to see you today, call us with questions.

## 2012-11-24 ENCOUNTER — Encounter: Payer: Self-pay | Admitting: Family Medicine

## 2012-11-26 ENCOUNTER — Ambulatory Visit (INDEPENDENT_AMBULATORY_CARE_PROVIDER_SITE_OTHER)
Admission: RE | Admit: 2012-11-26 | Discharge: 2012-11-26 | Disposition: A | Discharge status: Home / Self Care | Payer: Medicare HMO | Source: Ambulatory Visit | Attending: Family Medicine | Admitting: Family Medicine

## 2012-11-26 DIAGNOSIS — R109 Unspecified abdominal pain: Secondary | ICD-10-CM

## 2012-11-26 MED ORDER — IOHEXOL 300 MG/ML  SOLN
100.0000 mL | Freq: Once | INTRAMUSCULAR | Status: AC | PRN
  Administered 2012-11-26: 100 mL via INTRAVENOUS

## 2012-11-30 ENCOUNTER — Encounter: Payer: Self-pay | Admitting: Family Medicine

## 2012-11-30 ENCOUNTER — Other Ambulatory Visit: Payer: Self-pay | Admitting: Family Medicine

## 2012-11-30 MED ORDER — DICYCLOMINE HCL 10 MG PO CAPS: 10.0000 mg | ORAL_CAPSULE | Freq: Three times a day (TID) | ORAL | Status: DC

## 2012-12-13 ENCOUNTER — Ambulatory Visit (INDEPENDENT_AMBULATORY_CARE_PROVIDER_SITE_OTHER): Payer: Medicare HMO | Admitting: General Practice

## 2012-12-13 DIAGNOSIS — Z5181 Encounter for therapeutic drug level monitoring: Secondary | ICD-10-CM

## 2012-12-13 DIAGNOSIS — Z7901 Long term (current) use of anticoagulants: Secondary | ICD-10-CM

## 2012-12-13 DIAGNOSIS — I739 Peripheral vascular disease, unspecified: Secondary | ICD-10-CM

## 2013-01-10 ENCOUNTER — Ambulatory Visit (INDEPENDENT_AMBULATORY_CARE_PROVIDER_SITE_OTHER): Payer: Medicare HMO | Admitting: Family Medicine

## 2013-01-10 DIAGNOSIS — I739 Peripheral vascular disease, unspecified: Secondary | ICD-10-CM

## 2013-01-10 DIAGNOSIS — Z5181 Encounter for therapeutic drug level monitoring: Secondary | ICD-10-CM

## 2013-01-10 DIAGNOSIS — Z7901 Long term (current) use of anticoagulants: Secondary | ICD-10-CM

## 2013-02-07 ENCOUNTER — Ambulatory Visit (INDEPENDENT_AMBULATORY_CARE_PROVIDER_SITE_OTHER): Payer: Medicare HMO | Admitting: Family Medicine

## 2013-02-07 ENCOUNTER — Other Ambulatory Visit: Payer: Self-pay

## 2013-02-07 DIAGNOSIS — Z7901 Long term (current) use of anticoagulants: Secondary | ICD-10-CM

## 2013-02-07 DIAGNOSIS — Z5181 Encounter for therapeutic drug level monitoring: Secondary | ICD-10-CM

## 2013-02-07 DIAGNOSIS — I739 Peripheral vascular disease, unspecified: Secondary | ICD-10-CM

## 2013-02-07 LAB — POCT INR: INR: 2.1

## 2013-02-07 MED ORDER — LAMOTRIGINE 25 MG PO TABS: 25.0000 mg | ORAL_TABLET | Freq: Two times a day (BID) | ORAL | Status: DC

## 2013-02-07 NOTE — Telephone Encounter (Signed)
plz notify sent in. 

## 2013-02-07 NOTE — Telephone Encounter (Signed)
Pt left note requesting refill Lamotrigine 25 mg taking one tab by mouth twice a day [facility].Med list has take 4 tabs at hs. Spoke with pt; she used to get med filled by Dr Imogene Burn but she does not see him any more and request Dr Reece Agar to refill taking one tab twice a day.Please advise. Pt request cb when filled.

## 2013-02-08 NOTE — Telephone Encounter (Signed)
Patient notified

## 2013-03-01 ENCOUNTER — Other Ambulatory Visit: Payer: Self-pay | Admitting: *Deleted

## 2013-03-01 MED ORDER — LAMOTRIGINE 25 MG PO TABS: 25.0000 mg | ORAL_TABLET | Freq: Two times a day (BID) | ORAL | Status: DC

## 2013-03-03 ENCOUNTER — Other Ambulatory Visit: Payer: Self-pay | Admitting: Family Medicine

## 2013-03-03 DIAGNOSIS — E559 Vitamin D deficiency, unspecified: Secondary | ICD-10-CM

## 2013-03-03 DIAGNOSIS — E785 Hyperlipidemia, unspecified: Secondary | ICD-10-CM

## 2013-03-04 ENCOUNTER — Other Ambulatory Visit (INDEPENDENT_AMBULATORY_CARE_PROVIDER_SITE_OTHER): Payer: Medicare HMO

## 2013-03-04 DIAGNOSIS — E785 Hyperlipidemia, unspecified: Secondary | ICD-10-CM

## 2013-03-04 LAB — BASIC METABOLIC PANEL
BUN: 18 mg/dL (ref 6–23)
Creatinine, Ser: 0.9 mg/dL (ref 0.4–1.2)
GFR: 68.95 mL/min (ref >60.00)
Potassium: 4.5 mEq/L (ref 3.5–5.1)

## 2013-03-04 LAB — LIPID PANEL
HDL: 47.8 mg/dL (ref >39.00)
Total CHOL/HDL Ratio: 3
VLDL: 41 mg/dL — ABNORMAL HIGH (ref 0.0–40.0)

## 2013-03-04 LAB — LDL CHOLESTEROL, DIRECT: Direct LDL: 86.9 mg/dL

## 2013-03-07 ENCOUNTER — Ambulatory Visit (INDEPENDENT_AMBULATORY_CARE_PROVIDER_SITE_OTHER): Payer: Medicare HMO | Admitting: Family Medicine

## 2013-03-07 ENCOUNTER — Telehealth: Payer: Self-pay

## 2013-03-07 DIAGNOSIS — I739 Peripheral vascular disease, unspecified: Secondary | ICD-10-CM

## 2013-03-07 DIAGNOSIS — Z7901 Long term (current) use of anticoagulants: Secondary | ICD-10-CM

## 2013-03-07 DIAGNOSIS — Z5181 Encounter for therapeutic drug level monitoring: Secondary | ICD-10-CM

## 2013-03-07 LAB — POCT INR: INR: 2.5

## 2013-03-07 NOTE — Telephone Encounter (Signed)
[facility] faxed clarification of lamotrigine; new rx lamotrigine 25 mg bid,dated 03/01/13; Hx rx lamotrigine 100 mg bid.Please advise. Fax is in Dr Timoteo Expose in box.

## 2013-03-08 NOTE — Telephone Encounter (Signed)
Clarified and placed in my out box. Per patient, taking 1 25mg  tablet twice daily.

## 2013-03-11 ENCOUNTER — Ambulatory Visit (INDEPENDENT_AMBULATORY_CARE_PROVIDER_SITE_OTHER): Payer: Medicare HMO | Admitting: Family Medicine

## 2013-03-11 ENCOUNTER — Encounter: Payer: Self-pay | Admitting: Family Medicine

## 2013-03-11 VITALS — BP 132/80 | HR 75 | Temp 98.5°F | Ht <= 58 in | Wt 146.0 lb

## 2013-03-11 DIAGNOSIS — M8580 Other specified disorders of bone density and structure, unspecified site: Secondary | ICD-10-CM

## 2013-03-11 DIAGNOSIS — M899 Disorder of bone, unspecified: Secondary | ICD-10-CM

## 2013-03-11 DIAGNOSIS — R3915 Urgency of urination: Secondary | ICD-10-CM

## 2013-03-11 DIAGNOSIS — E559 Vitamin D deficiency, unspecified: Secondary | ICD-10-CM

## 2013-03-11 DIAGNOSIS — Z0000 Encounter for general adult medical examination without abnormal findings: Secondary | ICD-10-CM

## 2013-03-11 DIAGNOSIS — Z23 Encounter for immunization: Secondary | ICD-10-CM

## 2013-03-11 DIAGNOSIS — F319 Bipolar disorder, unspecified: Secondary | ICD-10-CM

## 2013-03-11 DIAGNOSIS — E785 Hyperlipidemia, unspecified: Secondary | ICD-10-CM

## 2013-03-11 LAB — POCT URINALYSIS DIPSTICK
Ketones, UA: NEGATIVE
Protein, UA: NEGATIVE
Spec Grav, UA: 1.015
Urobilinogen, UA: NEGATIVE

## 2013-03-11 MED ORDER — LAMOTRIGINE 25 MG PO TABS: 50.0000 mg | ORAL_TABLET | Freq: Two times a day (BID) | ORAL | Status: DC

## 2013-03-11 MED ORDER — CLONAZEPAM 0.5 MG PO TABS: 0.5000 mg | ORAL_TABLET | Freq: Two times a day (BID) | ORAL | Status: DC | PRN

## 2013-03-11 NOTE — Progress Notes (Signed)
Subjective:    Patient ID: Kathryn Mathews, female    DOB: 1945/09/26, 67 y.o.   MRN: [id number]  HPI CC: medicare wellness   Some urgency and frequency over last 24 hours.  Denies dysuria, hematuria, fevers/chills, nausea/vomiting.  No recent abx use.  Preventative:  Colonoscopy  08/2008   int/ext hem, severe diverticulosis with evidence of itis, rec rpt 10 yrs  Mammogram - normal 03/2012. Declines breast exam today, does not do breast exams at home.  Pap smear - Always normal in past. No problems with spotting, not sexually active.  Decided to age out Tetanus - 07/2011  zostavax 08/2011  Pneumovax 02/2011  Flu shot - requests today DEXA 2010 - osteopenia, femoral -2.1. Vit D low at 17 this check.  Living will - states "if I can't think, just let me go". Thinks husband knows, would want husband then son to make decisions, but nothing formal yet. Hasn't spoken with son about this. Would like to set up living will. Lost packet provided last year - requests new advanced directive packet.   Passes vision screen.  Passes hearing screen today. Multiple falls in last year, none in several months. completed HHPT 10/2011, declined outpt PT Screens positive for depression - h/o bipolar, compliant with meds.  Having trouble with bipolar disorder.  Has appt with psychiatrist for October this year.  Per patient was on lamictal 100mg  bid (but on our chart was taking 100mg  nightly), however this was changed over last several months to 25mg  bid unclear by who.  Pt feels bipolar disorder not well controlled on this dose - requests increase.  Medications and allergies reviewed and updated in chart.  Past histories reviewed and updated if relevant as below. Patient Active Problem List   Diagnosis Date Noted  . Ventral hernia 09/07/2012  . Nausea alone 09/07/2012  . Unspecified constipation 09/07/2012  . Abdominal discomfort 08/09/2012  . Spondylolisthesis, grade 3 07/15/2012  . Compression fracture of L1  lumbar vertebra 07/15/2012  . Bruising 07/15/2012  . Loss of taste 03/09/2012  . Imbalance 09/18/2011  . Dysphagia 07/21/2011  . Cerumen impaction 05/13/2011  . Medicare annual wellness visit, subsequent 02/18/2011  . Vitamin d deficiency 02/18/2011  . Osteopenia   . Bipolar disorder   . GERD (gastroesophageal reflux disease)   . HLD (hyperlipidemia)   . Hypothyroid   . History of DVT (deep vein thrombosis)   . PAD (peripheral artery disease)    Past Medical History  Diagnosis Date  . History of chicken pox   . Bipolar disorder     seeing psych Imogene Burn)  . GERD (gastroesophageal reflux disease)   . HLD (hyperlipidemia)   . Hypothyroid     previously hyperthyroid  . History of DVT (deep vein thrombosis)     on chronic coumadin since 1973, h/o blood clots on coumadin 2011, added ASA QD, but due to fall risk stopped and changed goal INR to 2.5-3.0  . Phlebitis     left leg stays swollen  . PAD (peripheral artery disease)   . Alopecia     well controlled per previous PCP  . Chronic sinusitis   . Glucose intolerance (impaired glucose tolerance)   . Closed right humeral fracture 2011    coracoid nondisplaced, conservative therapy (Dr. Cleon Gustin)  . Closed left humeral fracture 2010    closed left humeral neck fracture, also h/o L wrist fx  . Hyperactivity of bladder   . Osteopenia 2010    DEXA 2010: T score -1.3  L spine, -2.1 femoral neck  . Falls     completed HHPT 10/2011, declined outpt PT  . Anxiety     when around ppl  . Diverticulosis     by colonoscopy   Past Surgical History  Procedure Laterality Date  . Cholecystectomy    . Cesarean section  1973  . Arterial thrombectomy  07/2009    with R mid popliteal artery angioplasty [balloon] (2011)[[[[  . Hospitalization  01/2007    depression, ECT  . Inguinal hernia repair  1980s    x2 on right  . Colonoscopy  08/2008    int/ext hem, severe diverticulosis with evidence of itis, rec rpt 10 yrs   History  Substance Use  Topics  . Smoking status: Never Smoker   . Smokeless tobacco: Never Used  . Alcohol Use: No   Family History  Problem Relation Age of Onset  . Coronary artery disease Mother 37  . Hyperlipidemia Mother   . Heart disease Mother   . Hypertension Mother   . Breast cancer Maternal Aunt 54  . Coronary artery disease Maternal Uncle   . Coronary artery disease Maternal Grandmother 70  . Stroke Neg Hx   . Diabetes Neg Hx   . Cancer Neg Hx    Allergies  Allergen Reactions  . Codeine Nausea And Vomiting   Current Outpatient Prescriptions on File Prior to Visit  Medication Sig Dispense Refill  . atorvastatin (LIPITOR) 10 MG tablet Take 1 tablet (10 mg total) by mouth at bedtime.  90 tablet  3  . Biotin 1000 MCG tablet Take 1,000 mcg by mouth 2 (two) times daily.        Marland Kitchen buPROPion (WELLBUTRIN SR) 150 MG 12 hr tablet Take 1 tablet (150 mg total) by mouth 2 (two) times daily.  180 tablet  3  . Cholecalciferol (VITAMIN D) 2000 UNITS CAPS Take 1 capsule (2,000 Units total) by mouth daily.      . clonazePAM (KLONOPIN) 0.5 MG tablet Take 1 tablet (0.5 mg total) by mouth 2 (two) times daily as needed for anxiety.  60 tablet  1  . clotrimazole-betamethasone (LOTRISONE) cream Apply topically 2 (two) times daily. No more than 2 weeks at a time.  45 g  0  . docusate sodium (COLACE) 100 MG capsule Take 100 mg by mouth 2 (two) times daily as needed for constipation.      Marland Kitchen doxepin (SINEQUAN) 50 MG capsule Take 1 capsule (50 mg total) by mouth at bedtime.  90 capsule  3  . lamoTRIgine (LAMICTAL) 25 MG tablet Take 1 tablet (25 mg total) by mouth 2 (two) times daily.  180 tablet  2  . levothyroxine (SYNTHROID, LEVOTHROID) 50 MCG tablet Take 1 tablet (50 mcg total) by mouth daily.  90 tablet  3  . polyethylene glycol powder (GLYCOLAX/MIRALAX) powder Take 17 g by mouth 2 (two) times daily as needed.  3350 g  1  . warfarin (COUMADIN) 5 MG tablet Take 1 tablet (5 mg total) by mouth daily.  90 tablet  3  .  dicyclomine (BENTYL) 10 MG capsule Take 1 capsule (10 mg total) by mouth 4 (four) times daily -  before meals and at bedtime.  30 capsule  0   No current facility-administered medications on file prior to visit.     Review of Systems  Constitutional: Negative for fever, chills, activity change, appetite change, fatigue and unexpected weight change.  HENT: Negative for hearing loss and neck pain.  Eyes: Negative for visual disturbance.  Respiratory: Negative for cough, chest tightness, shortness of breath and wheezing.   Cardiovascular: Negative for chest pain, palpitations and leg swelling.  Gastrointestinal: Positive for nausea and abdominal pain (mild). Negative for vomiting, diarrhea, constipation, blood in stool and abdominal distention.  Genitourinary: Negative for hematuria and difficulty urinating.  Musculoskeletal: Negative for myalgias and arthralgias.  Skin: Negative for rash.  Neurological: Negative for dizziness, seizures, syncope and headaches.  Hematological: Does not bruise/bleed easily.  Psychiatric/Behavioral: Negative for dysphoric mood. The patient is not nervous/anxious.        Objective:   Physical Exam  Nursing note and vitals reviewed. Constitutional: She is oriented to person, place, and time. She appears well-developed and well-nourished. No distress.  HENT:  Head: Normocephalic and atraumatic.  Right Ear: Hearing, tympanic membrane, external ear and ear canal normal.  Left Ear: Hearing, tympanic membrane, external ear and ear canal normal.  Nose: Nose normal.  Mouth/Throat: Uvula is midline, oropharynx is clear and moist and mucous membranes are normal. No oropharyngeal exudate, posterior oropharyngeal edema, posterior oropharyngeal erythema or tonsillar abscesses.  Eyes: Conjunctivae and EOM are normal. Pupils are equal, round, and reactive to light. No scleral icterus.  Neck: Normal range of motion. Neck supple. No thyromegaly present.  Cardiovascular:  Normal rate, regular rhythm, normal heart sounds and intact distal pulses.   No murmur heard. Pulses:      Radial pulses are 2+ on the right side, and 2+ on the left side.  Pulmonary/Chest: Effort normal and breath sounds normal. No respiratory distress. She has no wheezes. She has no rales.  Breast exam: declines  Abdominal: Soft. Normal appearance and bowel sounds are normal. She exhibits no distension and no mass. There is no hepatosplenomegaly. There is tenderness (mild) in the periumbilical area and suprapubic area. There is no rigidity, no rebound, no guarding, no CVA tenderness and negative Murphy's sign.  Genitourinary:  GYN exam: declines  Musculoskeletal: Normal range of motion. She exhibits no edema.  Lymphadenopathy:    She has no cervical adenopathy.  Neurological: She is alert and oriented to person, place, and time.  CN grossly intact, station and gait intact  Skin: Skin is warm and dry. No rash noted.  Psychiatric: She has a normal mood and affect. Her behavior is normal. Judgment and thought content normal.      Assessment & Plan:

## 2013-03-11 NOTE — Patient Instructions (Addendum)
Try bentyl for abdominal discomfort. Schedule mammogram at breast center for next month as you will be due. Flu shot today. Klonopin refilled today Increase lamotrigine (lamictal) to 50mg  twice daily - new prescription sent to pharmacy Advanced directive packet provided today. Good to see you today, call us with questions.

## 2013-03-12 ENCOUNTER — Encounter: Payer: Self-pay | Admitting: Family Medicine

## 2013-03-12 DIAGNOSIS — R3915 Urgency of urination: Secondary | ICD-10-CM | POA: Insufficient documentation

## 2013-03-12 NOTE — Assessment & Plan Note (Signed)
Stable as of last check Encouraged continued supplementation.  Recheck next blood draw.

## 2013-03-12 NOTE — Assessment & Plan Note (Signed)
Reviewed blood work - discussed dietary modifications to lower triglycerides

## 2013-03-12 NOTE — Assessment & Plan Note (Signed)
Of 1d duration. Urine dip today with 1+ blood but micro WNL. Continue to monitor, if persistent, return for further eval.

## 2013-03-12 NOTE — Assessment & Plan Note (Signed)
Encouraged f/u with psych. Will slowly taper lamotrigine up - to 50mg  bid. Consider change to QD dosing next visit (prior on 100mg  QHS per our records).

## 2013-03-12 NOTE — Assessment & Plan Note (Signed)
I have personally reviewed the Medicare Annual Wellness questionnaire and have noted 1. The patient's medical and social history 2. Their use of alcohol, tobacco or illicit drugs 3. Their current medications and supplements 4. The patient's functional ability including ADL's, fall risks, home safety risks and hearing or visual impairment. 5. Diet and physical activity 6. Evidence for depression or mood disorders The patients weight, height, BMI have been recorded in the chart.  Hearing and vision has been addressed. I have made referrals, counseling and provided education to the patient based review of the above and I have provided the pt with a written personalized care plan for preventive services. See scanned questionairre. Advanced directives discussed: packet provided  Reviewed preventative protocols and updated unless pt declined.  UTD colonoscopy Declines breast and GYN exam.  Aged out of cervical cancer screening.  Will schedule mammogram.

## 2013-03-12 NOTE — Assessment & Plan Note (Signed)
Reviewed dexa scan, discussed cal/vit D daily recommendations In falls risk, consider rechecking next year.

## 2013-03-21 NOTE — Addendum Note (Signed)
Addended by: Eliezer Bottom on: 03/21/2013 08:02 AM   Modules accepted: Orders

## 2013-04-01 ENCOUNTER — Encounter: Payer: Self-pay | Admitting: Radiology

## 2013-04-04 ENCOUNTER — Ambulatory Visit (INDEPENDENT_AMBULATORY_CARE_PROVIDER_SITE_OTHER): Payer: Medicare HMO | Admitting: Family Medicine

## 2013-04-04 DIAGNOSIS — I739 Peripheral vascular disease, unspecified: Secondary | ICD-10-CM

## 2013-04-04 DIAGNOSIS — Z7901 Long term (current) use of anticoagulants: Secondary | ICD-10-CM

## 2013-04-04 DIAGNOSIS — Z5181 Encounter for therapeutic drug level monitoring: Secondary | ICD-10-CM

## 2013-04-08 ENCOUNTER — Emergency Department: Payer: Self-pay | Admitting: Emergency Medicine

## 2013-04-29 ENCOUNTER — Encounter: Payer: Self-pay | Admitting: Radiology

## 2013-05-02 ENCOUNTER — Ambulatory Visit (INDEPENDENT_AMBULATORY_CARE_PROVIDER_SITE_OTHER): Payer: Medicare HMO | Admitting: Family Medicine

## 2013-05-02 DIAGNOSIS — Z5181 Encounter for therapeutic drug level monitoring: Secondary | ICD-10-CM

## 2013-05-02 DIAGNOSIS — Z7901 Long term (current) use of anticoagulants: Secondary | ICD-10-CM

## 2013-05-02 DIAGNOSIS — I739 Peripheral vascular disease, unspecified: Secondary | ICD-10-CM

## 2013-05-30 ENCOUNTER — Ambulatory Visit (INDEPENDENT_AMBULATORY_CARE_PROVIDER_SITE_OTHER): Payer: Medicare HMO | Admitting: Family Medicine

## 2013-05-30 DIAGNOSIS — I739 Peripheral vascular disease, unspecified: Secondary | ICD-10-CM

## 2013-05-30 DIAGNOSIS — Z7901 Long term (current) use of anticoagulants: Secondary | ICD-10-CM

## 2013-05-30 DIAGNOSIS — Z5181 Encounter for therapeutic drug level monitoring: Secondary | ICD-10-CM

## 2013-06-27 ENCOUNTER — Ambulatory Visit (INDEPENDENT_AMBULATORY_CARE_PROVIDER_SITE_OTHER): Payer: Medicare HMO | Admitting: Family Medicine

## 2013-06-27 ENCOUNTER — Other Ambulatory Visit: Payer: Self-pay | Admitting: Family Medicine

## 2013-06-27 DIAGNOSIS — Z5181 Encounter for therapeutic drug level monitoring: Secondary | ICD-10-CM

## 2013-06-27 DIAGNOSIS — Z7901 Long term (current) use of anticoagulants: Secondary | ICD-10-CM

## 2013-06-27 DIAGNOSIS — I739 Peripheral vascular disease, unspecified: Secondary | ICD-10-CM

## 2013-06-27 LAB — POCT INR: INR: 2.1

## 2013-06-27 MED ORDER — WARFARIN SODIUM 5 MG PO TABS: ORAL_TABLET | ORAL | Status: DC

## 2013-07-25 ENCOUNTER — Ambulatory Visit (INDEPENDENT_AMBULATORY_CARE_PROVIDER_SITE_OTHER): Payer: Medicare HMO | Admitting: Family Medicine

## 2013-07-25 DIAGNOSIS — I739 Peripheral vascular disease, unspecified: Secondary | ICD-10-CM

## 2013-07-25 DIAGNOSIS — Z7901 Long term (current) use of anticoagulants: Secondary | ICD-10-CM

## 2013-07-25 DIAGNOSIS — Z5181 Encounter for therapeutic drug level monitoring: Secondary | ICD-10-CM

## 2013-07-25 LAB — POCT INR: INR: 1.8

## 2013-08-22 ENCOUNTER — Ambulatory Visit (INDEPENDENT_AMBULATORY_CARE_PROVIDER_SITE_OTHER): Payer: Medicare HMO | Admitting: Family Medicine

## 2013-08-22 DIAGNOSIS — Z5181 Encounter for therapeutic drug level monitoring: Secondary | ICD-10-CM | POA: Insufficient documentation

## 2013-08-22 DIAGNOSIS — Z7901 Long term (current) use of anticoagulants: Secondary | ICD-10-CM

## 2013-08-22 DIAGNOSIS — I739 Peripheral vascular disease, unspecified: Secondary | ICD-10-CM

## 2013-08-22 LAB — POCT INR: INR: 2

## 2013-09-19 ENCOUNTER — Ambulatory Visit (INDEPENDENT_AMBULATORY_CARE_PROVIDER_SITE_OTHER): Payer: Medicare HMO | Admitting: Family Medicine

## 2013-09-19 DIAGNOSIS — Z5181 Encounter for therapeutic drug level monitoring: Secondary | ICD-10-CM

## 2013-09-19 DIAGNOSIS — I739 Peripheral vascular disease, unspecified: Secondary | ICD-10-CM

## 2013-09-19 DIAGNOSIS — Z7901 Long term (current) use of anticoagulants: Secondary | ICD-10-CM

## 2013-09-19 LAB — POCT INR: INR: 2.1

## 2013-10-17 ENCOUNTER — Ambulatory Visit: Payer: Medicare HMO

## 2013-10-17 ENCOUNTER — Other Ambulatory Visit: Payer: Self-pay | Admitting: Family Medicine

## 2013-10-17 ENCOUNTER — Ambulatory Visit (INDEPENDENT_AMBULATORY_CARE_PROVIDER_SITE_OTHER): Payer: Medicare HMO | Admitting: Family Medicine

## 2013-10-17 DIAGNOSIS — Z7901 Long term (current) use of anticoagulants: Secondary | ICD-10-CM

## 2013-10-17 DIAGNOSIS — I739 Peripheral vascular disease, unspecified: Secondary | ICD-10-CM | POA: Diagnosis not present

## 2013-10-17 DIAGNOSIS — Z5181 Encounter for therapeutic drug level monitoring: Secondary | ICD-10-CM | POA: Diagnosis not present

## 2013-10-17 LAB — POCT INR: INR: 2.6

## 2013-10-17 MED ORDER — WARFARIN SODIUM 5 MG PO TABS: ORAL_TABLET | ORAL | Status: DC

## 2013-11-14 ENCOUNTER — Ambulatory Visit (INDEPENDENT_AMBULATORY_CARE_PROVIDER_SITE_OTHER): Payer: Medicare HMO | Admitting: Family Medicine

## 2013-11-14 DIAGNOSIS — I739 Peripheral vascular disease, unspecified: Secondary | ICD-10-CM

## 2013-11-14 DIAGNOSIS — Z7901 Long term (current) use of anticoagulants: Secondary | ICD-10-CM

## 2013-11-14 DIAGNOSIS — Z5181 Encounter for therapeutic drug level monitoring: Secondary | ICD-10-CM

## 2013-11-14 LAB — POCT INR: INR: 4.2

## 2013-12-12 ENCOUNTER — Other Ambulatory Visit: Payer: Self-pay

## 2013-12-12 ENCOUNTER — Ambulatory Visit (INDEPENDENT_AMBULATORY_CARE_PROVIDER_SITE_OTHER): Payer: Medicare HMO | Admitting: Family Medicine

## 2013-12-12 DIAGNOSIS — Z7901 Long term (current) use of anticoagulants: Secondary | ICD-10-CM

## 2013-12-12 DIAGNOSIS — I739 Peripheral vascular disease, unspecified: Secondary | ICD-10-CM

## 2013-12-12 DIAGNOSIS — Z5181 Encounter for therapeutic drug level monitoring: Secondary | ICD-10-CM

## 2013-12-12 LAB — POCT INR: INR: 4.4

## 2013-12-12 MED ORDER — LEVOTHYROXINE SODIUM 50 MCG PO TABS: 50.0000 ug | ORAL_TABLET | Freq: Every day | ORAL | Status: DC

## 2013-12-12 NOTE — Telephone Encounter (Signed)
Pt left note requesting refill for levothyroxine to [facility]. Pt is out of med; pt last seen 03/11/13 and pt was to f/u 08/2013; donot see future appt schedule.Please advise.

## 2014-01-09 ENCOUNTER — Ambulatory Visit (INDEPENDENT_AMBULATORY_CARE_PROVIDER_SITE_OTHER): Payer: Medicare HMO | Admitting: Family Medicine

## 2014-01-09 DIAGNOSIS — I739 Peripheral vascular disease, unspecified: Secondary | ICD-10-CM

## 2014-01-09 DIAGNOSIS — Z5181 Encounter for therapeutic drug level monitoring: Secondary | ICD-10-CM

## 2014-01-09 DIAGNOSIS — Z7901 Long term (current) use of anticoagulants: Secondary | ICD-10-CM

## 2014-01-09 LAB — POCT INR: INR: 2.5

## 2014-01-17 ENCOUNTER — Other Ambulatory Visit: Payer: Self-pay

## 2014-01-17 NOTE — Telephone Encounter (Signed)
Pt left note refill atorvastatin; pt out of med; last seen 03/11/13 with no future appt scheduled.Please advise.

## 2014-01-18 MED ORDER — ATORVASTATIN CALCIUM 10 MG PO TABS: 10.0000 mg | ORAL_TABLET | Freq: Every day | ORAL | Status: DC

## 2014-02-06 ENCOUNTER — Ambulatory Visit (INDEPENDENT_AMBULATORY_CARE_PROVIDER_SITE_OTHER): Payer: Medicare HMO | Admitting: Family Medicine

## 2014-02-06 DIAGNOSIS — Z5181 Encounter for therapeutic drug level monitoring: Secondary | ICD-10-CM

## 2014-02-06 DIAGNOSIS — I739 Peripheral vascular disease, unspecified: Secondary | ICD-10-CM

## 2014-02-06 DIAGNOSIS — Z7901 Long term (current) use of anticoagulants: Secondary | ICD-10-CM

## 2014-02-06 LAB — POCT INR: INR: 3.6

## 2014-03-03 ENCOUNTER — Telehealth: Payer: Self-pay | Admitting: Family Medicine

## 2014-03-03 NOTE — Telephone Encounter (Signed)
Patient Information:  Caller Name: Jasmine DecemberSharon  Phone: [phone]  Patient: Frankey ShownSeppey, Esli J  Gender: Female  DOB: 1946/06/23  Age: 68 Years  PCP: Eustaquio BoydenGutierrez, Javier [facility](Family Practice)  Office Follow Up:  Does the office need to follow up with this patient?: No  Instructions For The Office: N/A   Symptoms  Reason For Call & Symptoms: Severe Tooth Pain > 24 hours  Reviewed Health History In EMR: Yes  Reviewed Medications In EMR: Yes  Reviewed Allergies In EMR: Yes  Reviewed Surgeries / Procedures: Yes  Date of Onset of Symptoms: 03/02/2014  Guideline(s) Used:  Toothache  Disposition Per Guideline:   Call Dentist Now  Reason For Disposition Reached:   Severe toothache pain  Advice Given:  N/A  Patient Will Follow Care Advice:  YES

## 2014-03-06 ENCOUNTER — Other Ambulatory Visit: Payer: Self-pay

## 2014-03-06 ENCOUNTER — Ambulatory Visit (INDEPENDENT_AMBULATORY_CARE_PROVIDER_SITE_OTHER): Payer: Commercial Managed Care - HMO | Admitting: *Deleted

## 2014-03-06 DIAGNOSIS — Z5181 Encounter for therapeutic drug level monitoring: Secondary | ICD-10-CM

## 2014-03-06 DIAGNOSIS — I739 Peripheral vascular disease, unspecified: Secondary | ICD-10-CM

## 2014-03-06 DIAGNOSIS — Z7901 Long term (current) use of anticoagulants: Secondary | ICD-10-CM

## 2014-03-06 LAB — POCT INR: INR: 4

## 2014-03-06 MED ORDER — LEVOTHYROXINE SODIUM 50 MCG PO TABS: 50.0000 ug | ORAL_TABLET | Freq: Every day | ORAL | Status: DC

## 2014-03-06 NOTE — Telephone Encounter (Signed)
Pt left note requesting refill levothyroxine 50 mcg to [facility]. Pt has scheduled appt on 04/10/14; last TSH was 11/22/2012.Please advise.

## 2014-03-09 ENCOUNTER — Telehealth: Payer: Self-pay | Admitting: Family Medicine

## 2014-03-09 ENCOUNTER — Other Ambulatory Visit: Payer: Self-pay | Admitting: Family Medicine

## 2014-03-09 NOTE — Telephone Encounter (Signed)
[facility] came [facility] on Monday and said that she was having a tooth extracted on 03/29/14 and that the dentist told her that she needed to get instructions from [facility] as to how long to hold her Coumadin before the procedure.  We do not have a protocol for dental procedures and usually leave that up to the dentist.  I spoke to [facility] at the dentist's office who said she would talk to the dentist but that they usually leave it up to the pt's physician to decide.  She said it was a "simple extraction" but she has not return my call and pt has called to get instructions.  What would you advise?

## 2014-03-09 NOTE — Telephone Encounter (Signed)
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 Let me know if dentist not ok with this. Would check INR 1 day prior to surgery and fax results to dentist. Ideally INR to be mid 2s.  Lab Results  Component Value Date   INR 4.0 03/06/2014   INR 3.6 02/06/2014   INR 2.5 01/09/2014

## 2014-03-09 NOTE — Telephone Encounter (Signed)
Kathryn Mathews, please call Kathryn Mathews about her protime.

## 2014-03-10 NOTE — Telephone Encounter (Signed)
Pt notified and will check INR on 03/28/14, day before appt.

## 2014-03-18 ENCOUNTER — Emergency Department: Payer: Self-pay | Admitting: Emergency Medicine

## 2014-03-27 ENCOUNTER — Ambulatory Visit: Payer: Commercial Managed Care - HMO

## 2014-03-28 ENCOUNTER — Ambulatory Visit (INDEPENDENT_AMBULATORY_CARE_PROVIDER_SITE_OTHER)
Admission: RE | Admit: 2014-03-28 | Discharge: 2014-03-28 | Disposition: A | Discharge status: Home / Self Care | Payer: Commercial Managed Care - HMO | Source: Ambulatory Visit | Attending: Family Medicine | Admitting: Family Medicine

## 2014-03-28 ENCOUNTER — Encounter: Payer: Self-pay | Admitting: Family Medicine

## 2014-03-28 ENCOUNTER — Ambulatory Visit (INDEPENDENT_AMBULATORY_CARE_PROVIDER_SITE_OTHER): Payer: Commercial Managed Care - HMO | Admitting: Family Medicine

## 2014-03-28 ENCOUNTER — Ambulatory Visit: Payer: Commercial Managed Care - HMO

## 2014-03-28 VITALS — BP 130/78 | HR 71 | Temp 97.9°F | Resp 16 | Wt 141.1 lb

## 2014-03-28 DIAGNOSIS — W19XXXA Unspecified fall, initial encounter: Secondary | ICD-10-CM

## 2014-03-28 DIAGNOSIS — S2000XA Contusion of breast, unspecified breast, initial encounter: Secondary | ICD-10-CM

## 2014-03-28 DIAGNOSIS — R0781 Pleurodynia: Secondary | ICD-10-CM

## 2014-03-28 DIAGNOSIS — Z5181 Encounter for therapeutic drug level monitoring: Secondary | ICD-10-CM

## 2014-03-28 DIAGNOSIS — N6489 Other specified disorders of breast: Secondary | ICD-10-CM | POA: Insufficient documentation

## 2014-03-28 DIAGNOSIS — R079 Chest pain, unspecified: Secondary | ICD-10-CM

## 2014-03-28 DIAGNOSIS — R296 Repeated falls: Secondary | ICD-10-CM | POA: Insufficient documentation

## 2014-03-28 DIAGNOSIS — R0789 Other chest pain: Secondary | ICD-10-CM | POA: Insufficient documentation

## 2014-03-28 LAB — PROTIME-INR
INR: 4.5 ratio — ABNORMAL HIGH (ref 0.8–1.0)
PROTHROMBIN TIME: 48.4 s — AB (ref 9.6–13.1)

## 2014-03-28 LAB — POCT INR: INR: 4.5

## 2014-03-28 NOTE — Assessment & Plan Note (Signed)
Suspicious for fracture - check rib series on left. Discussed treatment plan as per instructions and anticipated course of improvement.

## 2014-03-28 NOTE — Patient Instructions (Signed)
I am worried about rib fracture - Xray today. This usually takes 6-8 weeks to fully heal. Treat pain with tylenol  twice daily scheduled, and use only 1/2 tablet of hydrocodone for breakthrough pain. Hold onto pillow with coughing or sneezing. Use heating pad to left ribcage as needed. For breast bruise - should improve with time, let us know if enlarging or becoming more firm. Blood work today Good to see you today, let us know if not improving as improving as expected

## 2014-03-28 NOTE — Assessment & Plan Note (Addendum)
Due to hydrocodone use. Already has stopped, discussed prn use of 1/2 tab at a time and to stop if recurrent dizziness/imbalance with hydrocodone Given coumadin use, check CBC and PT/INR to ensure no evidence of further bleed - if Hgb significantly lower than baseline would consider CT abd/pelvis.

## 2014-03-28 NOTE — Progress Notes (Signed)
Pre visit review using our clinic review tool, if applicable. No additional management support is needed unless otherwise documented below in the visit note. 

## 2014-03-28 NOTE — Progress Notes (Signed)
BP 130/78  Pulse 71  Temp(Src) 97.9 F (36.6 C) (Oral)  Resp 16  Wt 141 lb 1.9 oz (64.012 kg)  SpO2 97%   CC: "I fell"  Subjective:    Patient ID: Kathryn Mathews, female    DOB: 08-14-1945, 68 y.o.   MRN: [id number]  HPI: Annamae Shivley is a 68 y.o. female presenting on 03/28/2014 for Fall   DOI: 9/13 then again 9/14. Initially fell of stool - pillow slipped off stool. Fell flat on bottom and "felt something slip". Second fall - walking into bathroom slipped and hit side of tub, fell onto left breast. Taking tylenol for pain currently. Trouble walking, worse pain with cough and sneezing.   On coumadin for recurrent DVTs. Recently seen at [facility] ER with tooth ache, treated with hydrocodone and amoxicillin. Attributes both falls to hydrocodone.   H/o falls in past, completed HHPT 10/2011, declined outpt PT at that time Known h/o compression fracture in past  Lab Results  Component Value Date   INR 4.0 03/06/2014   INR 3.6 02/06/2014   INR 2.5 01/09/2014    Past Medical History  Diagnosis Date  . History of chicken pox   . Bipolar disorder     no psych  . GERD (gastroesophageal reflux disease)   . HLD (hyperlipidemia)   . Hypothyroid     previously hyperthyroid  . History of DVT (deep vein thrombosis)     on chronic coumadin since 1973, h/o blood clots on coumadin 2011, added ASA QD, but due to fall risk stopped and changed goal INR to 2.5-3.0  . Phlebitis     left leg stays swollen  . PAD (peripheral artery disease)   . Alopecia     well controlled per previous PCP - improved with biotin  . Chronic sinusitis   . Glucose intolerance (impaired glucose tolerance)   . Closed right humeral fracture 2011    coracoid nondisplaced, conservative therapy (Dr. Cleon Gustin)  . Closed left humeral fracture 2010    closed left humeral neck fracture, also h/o L wrist fx  . Hyperactivity of bladder   . Osteopenia 2010    DEXA 2010: T score -1.3 L spine, -2.1 femoral neck  . Falls    completed HHPT 10/2011, declined outpt PT  . Anxiety     when around ppl  . Diverticulosis     by colonoscopy    Relevant past medical, surgical, family and social history reviewed and updated as indicated.  Allergies and medications reviewed and updated. Current Outpatient Prescriptions on File Prior to Visit  Medication Sig  . atorvastatin (LIPITOR) 10 MG tablet Take 1 tablet (10 mg total) by mouth at bedtime.  . Biotin 1000 MCG tablet Take 1,000 mcg by mouth 2 (two) times daily.    . Calcium Carb-Cholecalciferol (CALCIUM-VITAMIN D) 600-400 MG-UNIT TABS Take 1 tablet by mouth daily.  . Cholecalciferol (VITAMIN D) 2000 UNITS CAPS Take 1 capsule (2,000 Units total) by mouth daily.  . clonazePAM (KLONOPIN) 0.5 MG tablet Take 1 tablet (0.5 mg total) by mouth 2 (two) times daily as needed for anxiety.  . clotrimazole-betamethasone (LOTRISONE) cream Apply topically 2 (two) times daily. No more than 2 weeks at a time.  . dicyclomine (BENTYL) 10 MG capsule Take 1 capsule (10 mg total) by mouth 4 (four) times daily -  before meals and at bedtime.  . docusate sodium (COLACE) 100 MG capsule Take 100 mg by mouth 2 (two) times daily as needed for constipation.  Marland Kitchen  doxepin (SINEQUAN) 50 MG capsule Take 1 capsule (50 mg total) by mouth at bedtime.  . lamoTRIgine (LAMICTAL) 25 MG tablet Take 2 tablets (50 mg total) by mouth 2 (two) times daily.  Marland Kitchen levothyroxine (SYNTHROID, LEVOTHROID) 50 MCG tablet Take 1 tablet (50 mcg total) by mouth daily.  Marland Kitchen levothyroxine (SYNTHROID, LEVOTHROID) 50 MCG tablet TAKE 1 TABLET (50 MCG TOTAL) BY MOUTH DAILY.  Marland Kitchen polyethylene glycol powder (GLYCOLAX/MIRALAX) powder Take 17 g by mouth 2 (two) times daily as needed.  . warfarin (COUMADIN) 5 MG tablet Take daily as directed by [facility].  Marland Kitchen buPROPion (WELLBUTRIN SR) 150 MG 12 hr tablet Take 1 tablet (150 mg total) by mouth 2 (two) times daily.   No current facility-administered medications on file prior to visit.     Review of Systems Per HPI unless specifically indicated above    Objective:    BP 130/78  Pulse 71  Temp(Src) 97.9 F (36.6 C) (Oral)  Resp 16  Wt 141 lb 1.9 oz (64.012 kg)  SpO2 97%  Physical Exam  Nursing note and vitals reviewed. Constitutional: She appears well-developed and well-nourished. No distress.  In [facility], but able to stand up with some assistance  Pulmonary/Chest: Effort normal and breath sounds normal. No respiratory distress. She has no wheezes. She has no rales. She exhibits tenderness.    Lungs clear. Point tender to palpation along left ribcage. Ecchymosis L lateral breast, no firm tissue suggesting developing hematoma, no significant pain at breast.  Abdominal: Soft. She exhibits no distension. There is no tenderness. There is no guarding.  No significant tenderness at spleen.  Musculoskeletal: She exhibits no edema.  Mild midline spine tenderness but unchanged from her baseline, no pain at sacrum/coccyx.  Skin: Skin is warm and dry. Ecchymosis noted. No rash noted. No erythema.       Assessment & Plan:   Problem List Items Addressed This Visit   Rib pain on left side - Primary     Suspicious for fracture - check rib series on left. Discussed treatment plan as per instructions and anticipated course of improvement.    Relevant Orders      DG Ribs Unilateral Left   Falls     Due to hydrocodone use. Already has stopped, discussed prn use of 1/2 tab at a time and to stop if recurrent dizziness/imbalance with hydrocodone Given coumadin use, check CBC and PT/INR to ensure no evidence of further bleed - if Hgb significantly lower than baseline would consider CT abd/pelvis.    Encounter for therapeutic drug monitoring   Relevant Orders      Protime-INR      CBC with Differential   Bruise of breast     No hematoma noted. Should continue to heal well.        Follow up plan: Return if symptoms worsen or fail to improve.

## 2014-03-28 NOTE — Assessment & Plan Note (Signed)
No hematoma noted. Should continue to heal well.

## 2014-03-29 LAB — CBC WITH DIFFERENTIAL/PLATELET
BASOS PCT: 0.3 % (ref 0.0–3.0)
Basophils Absolute: 0 10*3/uL (ref 0.0–0.1)
Eosinophils Absolute: 0.2 10*3/uL (ref 0.0–0.7)
Eosinophils Relative: 2.7 % (ref 0.0–5.0)
HCT: 44.3 % (ref 36.0–46.0)
HEMOGLOBIN: 14.6 g/dL (ref 12.0–15.0)
LYMPHS ABS: 1.8 10*3/uL (ref 0.7–4.0)
Lymphocytes Relative: 23 % (ref 12.0–46.0)
MCHC: 32.9 g/dL (ref 30.0–36.0)
MCV: 98 fl (ref 78.0–100.0)
MONO ABS: 0.3 10*3/uL (ref 0.1–1.0)
Monocytes Relative: 3.6 % (ref 3.0–12.0)
NEUTROS ABS: 5.6 10*3/uL (ref 1.4–7.7)
Neutrophils Relative %: 70.4 % (ref 43.0–77.0)
Platelets: 216 10*3/uL (ref 150.0–400.0)
RBC: 4.53 Mil/uL (ref 3.87–5.11)
RDW: 13.8 % (ref 11.5–15.5)
WBC: 8 10*3/uL (ref 4.0–10.5)

## 2014-03-30 ENCOUNTER — Ambulatory Visit: Payer: Commercial Managed Care - HMO | Admitting: Family Medicine

## 2014-03-30 ENCOUNTER — Ambulatory Visit: Payer: Commercial Managed Care - HMO

## 2014-03-30 DIAGNOSIS — Z5181 Encounter for therapeutic drug level monitoring: Secondary | ICD-10-CM

## 2014-03-30 DIAGNOSIS — I739 Peripheral vascular disease, unspecified: Secondary | ICD-10-CM

## 2014-04-02 ENCOUNTER — Other Ambulatory Visit: Payer: Self-pay | Admitting: Family Medicine

## 2014-04-02 DIAGNOSIS — E039 Hypothyroidism, unspecified: Secondary | ICD-10-CM

## 2014-04-02 DIAGNOSIS — E785 Hyperlipidemia, unspecified: Secondary | ICD-10-CM

## 2014-04-02 DIAGNOSIS — E559 Vitamin D deficiency, unspecified: Secondary | ICD-10-CM

## 2014-04-03 ENCOUNTER — Ambulatory Visit: Payer: Commercial Managed Care - HMO

## 2014-04-04 ENCOUNTER — Other Ambulatory Visit (INDEPENDENT_AMBULATORY_CARE_PROVIDER_SITE_OTHER): Payer: Commercial Managed Care - HMO

## 2014-04-04 DIAGNOSIS — E785 Hyperlipidemia, unspecified: Secondary | ICD-10-CM

## 2014-04-04 DIAGNOSIS — E039 Hypothyroidism, unspecified: Secondary | ICD-10-CM

## 2014-04-04 DIAGNOSIS — E559 Vitamin D deficiency, unspecified: Secondary | ICD-10-CM

## 2014-04-04 LAB — COMPREHENSIVE METABOLIC PANEL
ALK PHOS: 55 U/L (ref 39–117)
ALT: 10 U/L (ref 0–35)
AST: 21 U/L (ref 0–37)
Albumin: 3.9 g/dL (ref 3.5–5.2)
BUN: 14 mg/dL (ref 6–23)
CO2: 24 mEq/L (ref 19–32)
CREATININE: 0.7 mg/dL (ref 0.4–1.2)
Calcium: 9.3 mg/dL (ref 8.4–10.5)
Chloride: 108 mEq/L (ref 96–112)
GFR: 82.84 mL/min (ref >60.00)
Glucose, Bld: 99 mg/dL (ref 70–99)
Potassium: 3.9 mEq/L (ref 3.5–5.1)
Sodium: 139 mEq/L (ref 135–145)
Total Bilirubin: 0.4 mg/dL (ref 0.2–1.2)
Total Protein: 7.3 g/dL (ref 6.0–8.3)

## 2014-04-04 LAB — LIPID PANEL
CHOL/HDL RATIO: 4
CHOLESTEROL: 163 mg/dL (ref 0–200)
HDL: 42 mg/dL (ref >39.00)
NONHDL: 121
TRIGLYCERIDES: 209 mg/dL — AB (ref 0.0–149.0)
VLDL: 41.8 mg/dL — AB (ref 0.0–40.0)

## 2014-04-04 LAB — TSH: TSH: 2.3 u[IU]/mL (ref 0.35–4.50)

## 2014-04-04 LAB — LDL CHOLESTEROL, DIRECT: LDL DIRECT: 93.6 mg/dL

## 2014-04-04 LAB — VITAMIN D 25 HYDROXY (VIT D DEFICIENCY, FRACTURES): VITD: 47.57 ng/mL (ref 30.00–100.00)

## 2014-04-10 ENCOUNTER — Encounter: Payer: Self-pay | Admitting: Family Medicine

## 2014-04-10 ENCOUNTER — Ambulatory Visit (INDEPENDENT_AMBULATORY_CARE_PROVIDER_SITE_OTHER): Payer: Commercial Managed Care - HMO | Admitting: Family Medicine

## 2014-04-10 ENCOUNTER — Ambulatory Visit (INDEPENDENT_AMBULATORY_CARE_PROVIDER_SITE_OTHER): Payer: Commercial Managed Care - HMO | Admitting: *Deleted

## 2014-04-10 VITALS — BP 126/80 | HR 71 | Temp 98.3°F | Resp 14 | Wt 141.5 lb

## 2014-04-10 DIAGNOSIS — Z23 Encounter for immunization: Secondary | ICD-10-CM

## 2014-04-10 DIAGNOSIS — I739 Peripheral vascular disease, unspecified: Secondary | ICD-10-CM

## 2014-04-10 DIAGNOSIS — M949 Disorder of cartilage, unspecified: Secondary | ICD-10-CM

## 2014-04-10 DIAGNOSIS — Z0000 Encounter for general adult medical examination without abnormal findings: Secondary | ICD-10-CM

## 2014-04-10 DIAGNOSIS — Z7189 Other specified counseling: Secondary | ICD-10-CM

## 2014-04-10 DIAGNOSIS — F319 Bipolar disorder, unspecified: Secondary | ICD-10-CM

## 2014-04-10 DIAGNOSIS — Z5181 Encounter for therapeutic drug level monitoring: Secondary | ICD-10-CM

## 2014-04-10 DIAGNOSIS — E039 Hypothyroidism, unspecified: Secondary | ICD-10-CM

## 2014-04-10 DIAGNOSIS — E785 Hyperlipidemia, unspecified: Secondary | ICD-10-CM | POA: Diagnosis not present

## 2014-04-10 DIAGNOSIS — M899 Disorder of bone, unspecified: Secondary | ICD-10-CM

## 2014-04-10 DIAGNOSIS — F3160 Bipolar disorder, current episode mixed, unspecified: Secondary | ICD-10-CM

## 2014-04-10 DIAGNOSIS — E559 Vitamin D deficiency, unspecified: Secondary | ICD-10-CM

## 2014-04-10 DIAGNOSIS — M8580 Other specified disorders of bone density and structure, unspecified site: Secondary | ICD-10-CM

## 2014-04-10 DIAGNOSIS — Z7901 Long term (current) use of anticoagulants: Secondary | ICD-10-CM

## 2014-04-10 LAB — POCT INR: INR: 1.7

## 2014-04-10 NOTE — Assessment & Plan Note (Signed)
Reviewed labwork. Decrease added sugars, eliminate trans fats, increase fiber and limit alcohol.  All these changes together can drop triglycerides by almost 50%.

## 2014-04-10 NOTE — Assessment & Plan Note (Signed)
Reviewed dx, rec continue cal/vit D and weight bearing exercises. H/o compression fx after fall. Consider rpt DEXA.

## 2014-04-10 NOTE — Patient Instructions (Addendum)
Flu shot today, pneumonia shot today. Call and schedule mammogram - [phone] Look into weight bearing exercise for bone health. Good to see you today, call us with questions.

## 2014-04-10 NOTE — Addendum Note (Signed)
Addended by: Melene Plan on: 04/10/2014 04:13 PM   Modules accepted: Orders

## 2014-04-10 NOTE — Assessment & Plan Note (Signed)
Thyroid function stable on current levothyroxine dose.

## 2014-04-10 NOTE — Assessment & Plan Note (Signed)

## 2014-04-10 NOTE — Assessment & Plan Note (Signed)
Stable on replacement  

## 2014-04-10 NOTE — Assessment & Plan Note (Signed)
Stable on current regimen. Followed by Dr. Toni Amend.

## 2014-04-10 NOTE — Progress Notes (Signed)
BP 126/80  Pulse 71  Temp(Src) 98.3 F (36.8 C) (Oral)  Resp 14  Wt 141 lb 8 oz (64.184 kg)  SpO2 97%   CC: medicare wellness  Subjective:    Patient ID: Kathryn Mathews, female    DOB: Aug 31, 1945, 68 y.o.   MRN: [id number]  HPI: Kathryn Mathews is a 68 y.o. female presenting on 04/10/2014 for Annual Exam   Rib pain has resolved. No longer taking pain med.  Wt Readings from Last 3 Encounters:  04/10/14 141 lb 8 oz (64.184 kg)  03/28/14 141 lb 1.9 oz (64.012 kg)  03/11/13 146 lb (66.225 kg)   Body mass index is 30.36 kg/(m^2).  Passes vision screen.  Passes hearing screen today.  1 fall this past year with injury to ribcage. Denies imbalance. Denies depression/anhedonia, sadness.  Preventative: COLONOSCOPY Date: 08/2008 int/ext hem, severe diverticulosis with evidence of itis, rec rpt 10 yrs Mammogram - normal 03/2012. Declines breast exam today, does not do breast exams at home.  Pap smear - Always normal in past. No problems with spotting, not sexually active. Decided to age out  Flu shot - requests today  Tetanus - 07/2011  Pneumovax 02/2011, prevnar today zostavax 08/2011  DEXA 2010 - osteopenia, femoral -2.1. Compliant with calcium and vit D. Advanced directive - states "if I can't think, just let me go". Thinks husband knows, would want husband then son to make decisions, but nothing formal yet. Requests packet today.  Caffeine: 2 diet coke/day Lives with husband, 1 cat. 1 grown son - lives nearby. [place]oved from [place]. Activity: no regular exercise Diet: good water, fruits/vegetables daily  Relevant past medical, surgical, family and social history reviewed and updated as indicated.  Allergies and medications reviewed and updated. Current Outpatient Prescriptions on File Prior to Visit  Medication Sig  . acetaminophen (TYLENOL) 500 MG tablet Take 1,000 mg by mouth 2 (two) times daily.  Marland Kitchen atorvastatin (LIPITOR) 10 MG tablet Take 1 tablet (10 mg total) by mouth at  bedtime.  . Biotin 1000 MCG tablet Take 1,000 mcg by mouth 2 (two) times daily.    . Calcium Carb-Cholecalciferol (CALCIUM-VITAMIN D) 600-400 MG-UNIT TABS Take 1 tablet by mouth daily.  . Cholecalciferol (VITAMIN D) 2000 UNITS CAPS Take 1 capsule (2,000 Units total) by mouth daily.  . clonazePAM (KLONOPIN) 0.5 MG tablet Take 1 tablet (0.5 mg total) by mouth 2 (two) times daily as needed for anxiety.  . clotrimazole-betamethasone (LOTRISONE) cream Apply topically 2 (two) times daily. No more than 2 weeks at a time.  . dicyclomine (BENTYL) 10 MG capsule Take 1 capsule (10 mg total) by mouth 4 (four) times daily -  before meals and at bedtime.  . docusate sodium (COLACE) 100 MG capsule Take 100 mg by mouth 2 (two) times daily as needed for constipation.  Marland Kitchen doxepin (SINEQUAN) 50 MG capsule Take 1 capsule (50 mg total) by mouth at bedtime.  . lamoTRIgine (LAMICTAL) 25 MG tablet Take 2 tablets (50 mg total) by mouth 2 (two) times daily.  Marland Kitchen levothyroxine (SYNTHROID, LEVOTHROID) 50 MCG tablet TAKE 1 TABLET (50 MCG TOTAL) BY MOUTH DAILY.  Marland Kitchen polyethylene glycol powder (GLYCOLAX/MIRALAX) powder Take 17 g by mouth 2 (two) times daily as needed.  . warfarin (COUMADIN) 5 MG tablet Take daily as directed by [facility].  Marland Kitchen buPROPion (WELLBUTRIN SR) 150 MG 12 hr tablet Take 1 tablet (150 mg total) by mouth 2 (two) times daily.   No current facility-administered medications on file prior to  visit.    Review of Systems Per HPI unless specifically indicated above    Objective:    BP 126/80  Pulse 71  Temp(Src) 98.3 F (36.8 C) (Oral)  Resp 14  Wt 141 lb 8 oz (64.184 kg)  SpO2 97%  Physical Exam  Nursing note and vitals reviewed. Constitutional: She is oriented to person, place, and time. She appears well-developed and well-nourished. No distress.  HENT:  Head: Normocephalic and atraumatic.  Right Ear: Hearing, tympanic membrane, external ear and ear canal normal.  Left Ear: Hearing, tympanic  membrane, external ear and ear canal normal.  Nose: Nose normal.  Mouth/Throat: Uvula is midline, oropharynx is clear and moist and mucous membranes are normal. No oropharyngeal exudate, posterior oropharyngeal edema or posterior oropharyngeal erythema.  Eyes: Conjunctivae and EOM are normal. Pupils are equal, round, and reactive to light. No scleral icterus.  Neck: Normal range of motion. Neck supple. Carotid bruit is not present. No thyromegaly present.  Cardiovascular: Normal rate, regular rhythm, normal heart sounds and intact distal pulses.   No murmur heard. Pulses:      Radial pulses are 2+ on the right side, and 2+ on the left side.  Pulmonary/Chest: Effort normal and breath sounds normal. No respiratory distress. She has no wheezes. She has no rales.  Abdominal: Soft. Bowel sounds are normal. She exhibits no distension and no mass. There is no tenderness. There is no rebound and no guarding.  Musculoskeletal: Normal range of motion. She exhibits no edema.  Lymphadenopathy:    She has no cervical adenopathy.  Neurological: She is alert and oriented to person, place, and time.  CN grossly intact, station and gait intact Recall 3/3 calculation [id number]  Skin: Skin is warm and dry. No rash noted.  Psychiatric: She has a normal mood and affect. Her behavior is normal. Judgment and thought content normal.   Results for orders placed in visit on 04/10/14  POCT INR      Result Value Ref Range   INR 1.7        Assessment & Plan:   Problem List Items Addressed This Visit   Vitamin D deficiency     Stable on replacement.    Osteopenia     Reviewed dx, rec continue cal/vit D and weight bearing exercises. H/o compression fx after fall. Consider rpt DEXA.    Medicare annual wellness visit, subsequent - Primary     I have personally reviewed the Medicare Annual Wellness questionnaire and have noted 1. The patient's medical and social history 2. Their use of alcohol, tobacco or  illicit drugs 3. Their current medications and supplements 4. The patient's functional ability including ADL's, fall risks, home safety risks and hearing or visual impairment. 5. Diet and physical activity 6. Evidence for depression or mood disorders The patients weight, height, BMI have been recorded in the chart.  Hearing and vision has been addressed. I have made referrals, counseling and provided education to the patient based review of the above and I have provided the pt with a written personalized care plan for preventive services. Provider list updated - see scanned questionairre.  Reviewed preventative protocols and updated unless pt declined.    Hypothyroid     Thyroid function stable on current levothyroxine dose.    HLD (hyperlipidemia)     Reviewed labwork. Decrease added sugars, eliminate trans fats, increase fiber and limit alcohol.  All these changes together can drop triglycerides by almost 50%.    Bipolar disorder  Stable on current regimen. Followed by Dr. Toni Amend.    Advanced care planning/counseling discussion     Advanced directive - states "if I can't think, just let me go". Thinks husband knows, would want husband then son to make decisions, but nothing formal yet. Requests packet today.        Follow up plan: No Follow-up on file.

## 2014-04-10 NOTE — Progress Notes (Signed)
Pre visit review using our clinic review tool, if applicable. No additional management support is needed unless otherwise documented below in the visit note. 

## 2014-04-10 NOTE — Assessment & Plan Note (Signed)
Advanced directive - states "if I can't think, just let me go". Thinks husband knows, would want husband then son to make decisions, but nothing formal yet. Requests packet today.

## 2014-04-13 ENCOUNTER — Other Ambulatory Visit: Payer: Self-pay

## 2014-04-13 MED ORDER — WARFARIN SODIUM 5 MG PO TABS: ORAL_TABLET | ORAL | Status: DC

## 2014-04-13 MED ORDER — ATORVASTATIN CALCIUM 10 MG PO TABS: 10.0000 mg | ORAL_TABLET | Freq: Every day | ORAL | Status: DC

## 2014-04-13 MED ORDER — LEVOTHYROXINE SODIUM 50 MCG PO TABS: ORAL_TABLET | ORAL | Status: DC

## 2014-04-13 NOTE — Telephone Encounter (Signed)
Pt left note requesting 90 day rx atorvastatin, levothyroxine (done) and warfarin to [facility] order [facility].Please advise.

## 2014-05-01 ENCOUNTER — Ambulatory Visit (INDEPENDENT_AMBULATORY_CARE_PROVIDER_SITE_OTHER): Payer: Commercial Managed Care - HMO | Admitting: *Deleted

## 2014-05-01 DIAGNOSIS — Z5181 Encounter for therapeutic drug level monitoring: Secondary | ICD-10-CM

## 2014-05-01 DIAGNOSIS — I739 Peripheral vascular disease, unspecified: Secondary | ICD-10-CM

## 2014-05-01 DIAGNOSIS — Z7901 Long term (current) use of anticoagulants: Secondary | ICD-10-CM

## 2014-05-01 LAB — POCT INR: INR: 1.4

## 2014-05-15 ENCOUNTER — Telehealth: Payer: Self-pay | Admitting: Family Medicine

## 2014-05-15 ENCOUNTER — Encounter: Payer: Self-pay | Admitting: Internal Medicine

## 2014-05-15 ENCOUNTER — Ambulatory Visit (INDEPENDENT_AMBULATORY_CARE_PROVIDER_SITE_OTHER): Payer: Commercial Managed Care - HMO | Admitting: Internal Medicine

## 2014-05-15 VITALS — BP 126/90 | HR 75 | Temp 98.1°F | Wt 139.0 lb

## 2014-05-15 DIAGNOSIS — L259 Unspecified contact dermatitis, unspecified cause: Secondary | ICD-10-CM

## 2014-05-15 MED ORDER — METHYLPREDNISOLONE ACETATE 80 MG/ML IJ SUSP
80.0000 mg | Freq: Once | INTRAMUSCULAR | Status: AC
  Administered 2014-05-15: 80 mg via INTRAMUSCULAR

## 2014-05-15 MED ORDER — PREDNISONE 10 MG PO TABS: ORAL_TABLET | ORAL | Status: DC

## 2014-05-15 NOTE — Progress Notes (Signed)
Pre visit review using our clinic review tool, if applicable. No additional management support is needed unless otherwise documented below in the visit note. 

## 2014-05-15 NOTE — Progress Notes (Signed)
Subjective:    Patient ID: Kathryn Mathews, female    DOB: August 25, 1945, 68 y.o.   MRN: [id number]  HPI  Pt presents to the clinic today with c/o a rash under both of her eyes. She noticed this 3 days ago. It started out as little bumps but got much worse last night. She reports the rash is very itchy. She does report using a new eye makeup about 5 days ago. She reports she has never been allergic to anything like this in the past.  Review of Systems      Past Medical History  Diagnosis Date  . History of chicken pox   . Bipolar disorder     no psych  . GERD (gastroesophageal reflux disease)   . HLD (hyperlipidemia)   . Hypothyroid     previously hyperthyroid  . History of DVT (deep vein thrombosis)     on chronic coumadin since 1973, h/o blood clots on coumadin 2011, added ASA QD, but due to fall risk stopped and changed goal INR to 2.5-3.0  . Phlebitis     left leg stays swollen  . PAD (peripheral artery disease)   . Alopecia     well controlled per previous PCP - improved with biotin  . Chronic sinusitis   . Glucose intolerance (impaired glucose tolerance)   . Closed right humeral fracture 2011    coracoid nondisplaced, conservative therapy (Dr. Cleon Gustinorning)  . Closed left humeral fracture 2010    closed left humeral neck fracture, also h/o L wrist fx  . Hyperactivity of bladder   . Osteopenia 2010    DEXA 2010: T score -1.3 L spine, -2.1 femoral neck  . Falls     completed HHPT 10/2011, declined outpt PT  . Anxiety     when around ppl  . Diverticulosis     by colonoscopy    Current Outpatient Prescriptions  Medication Sig Dispense Refill  . acetaminophen (TYLENOL) 500 MG tablet Take 1,000 mg by mouth 2 (two) times daily.    Marland Kitchen. atorvastatin (LIPITOR) 10 MG tablet Take 1 tablet (10 mg total) by mouth at bedtime. 90 tablet 1  . Biotin 1000 MCG tablet Take 1,000 mcg by mouth 2 (two) times daily.      . Calcium Carb-Cholecalciferol (CALCIUM-VITAMIN D) 600-400 MG-UNIT TABS  Take 1 tablet by mouth daily.    . Cholecalciferol (VITAMIN D) 2000 UNITS CAPS Take 1 capsule (2,000 Units total) by mouth daily.    . clonazePAM (KLONOPIN) 0.5 MG tablet Take 1 tablet (0.5 mg total) by mouth 2 (two) times daily as needed for anxiety. 60 tablet 1  . clotrimazole-betamethasone (LOTRISONE) cream Apply topically 2 (two) times daily. No more than 2 weeks at a time. 45 g 0  . dicyclomine (BENTYL) 10 MG capsule Take 1 capsule (10 mg total) by mouth 4 (four) times daily -  before meals and at bedtime. 30 capsule 0  . docusate sodium (COLACE) 100 MG capsule Take 100 mg by mouth 2 (two) times daily as needed for constipation.    Marland Kitchen. doxepin (SINEQUAN) 50 MG capsule Take 1 capsule (50 mg total) by mouth at bedtime. 90 capsule 3  . lamoTRIgine (LAMICTAL) 25 MG tablet Take 2 tablets (50 mg total) by mouth 2 (two) times daily. 360 tablet 3  . levothyroxine (SYNTHROID, LEVOTHROID) 50 MCG tablet TAKE 1 TABLET (50 MCG TOTAL) BY MOUTH DAILY. 90 tablet 1  . polyethylene glycol powder (GLYCOLAX/MIRALAX) powder Take 17 g by mouth 2 (two)  times daily as needed. 3350 g 1  . warfarin (COUMADIN) 5 MG tablet Take daily as directed by [facility] Clinic. 96 tablet 1  . buPROPion (WELLBUTRIN SR) 150 MG 12 hr tablet Take 1 tablet (150 mg total) by mouth 2 (two) times daily. 180 tablet 3  . predniSONE (DELTASONE) 10 MG tablet Take 3 tabs on days 1-2, take 2 tabs on days 3-4, take 1 tab on days 5-6 12 tablet 0   No current facility-administered medications for this visit.    Allergies  Allergen Reactions  . Codeine Nausea And Vomiting  . Hydrocodone Other (See Comments)    Imbalance/falls    Family History  Problem Relation Age of Onset  . Coronary artery disease Mother [id number]  . Hyperlipidemia Mother   . Heart disease Mother   . Hypertension Mother   . Breast cancer Maternal Aunt 54  . Coronary artery disease Maternal Uncle   . Coronary artery disease Maternal Grandmother 70  . Stroke Neg Hx   .  Diabetes Neg Hx   . Cancer Neg Hx     History   Social History  . Marital Status: Married    Spouse Name: N/A    Number of Children: N/A  . Years of Education: N/A   Occupational History  .      retired   Social History Main Topics  . Smoking status: Never Smoker   . Smokeless tobacco: Never Used  . Alcohol Use: No  . Drug Use: No  . Sexual Activity: Not on file   Other Topics Concern  . Not on file   Social History Narrative   Caffeine: 2 diet coke/day   Lives with husband, 1 cat.   1 grown son - lives nearby.   [place]   Activity: no regular exercise   Diet: good water, fruits/vegetables daily      Advanced directive - states "if I can't think, just let me go". Thinks husband knows, would want husband then son to make decisions, but nothing formal yet. Requests packet today.     Constitutional: Denies fever, malaise, fatigue, headache or abrupt weight changes.  Skin: Pt reports rash under eyes. Denies lesions or ulcercations.   No other specific complaints in a complete review of systems (except as listed in HPI above).  Objective:   Physical Exam  BP 126/90 mmHg  Pulse 75  Temp(Src) 98.1 F (36.7 C) (Oral)  Wt 139 lb (63.05 kg)  SpO2 98% Wt Readings from Last 3 Encounters:  05/15/14 139 lb (63.05 kg)  04/10/14 141 lb 8 oz (64.184 kg)  03/28/14 141 lb 1.9 oz (64.012 kg)    General: Appears her stated age, well developed, well nourished in NAD. Skin: Maculopapular rash on erythematous base noted under bilateral eyes. Cardiovascular: Normal rate and rhythm. S1,S2 noted.  No murmur, rubs or gallops noted.. Pulmonary/Chest: Normal effort and positive vesicular breath sounds. No respiratory distress. No wheezes, rales or ronchi noted.    BMET    Component Value Date/Time   NA 139 04/04/2014 0844   NA 142 04/15/2010   K 3.9 04/04/2014 0844   K 4.1 04/15/2010   CL 108 04/04/2014 0844   CO2 24 04/04/2014 0844   GLUCOSE 99 04/04/2014 0844    BUN 14 04/04/2014 0844   BUN 18 04/15/2010   CREATININE 0.7 04/04/2014 0844   CREATININE 1.0 04/15/2010   CALCIUM 9.3 04/04/2014 0844   CALCIUM 9.6 04/15/2010    Lipid Panel  Component Value Date/Time   CHOL 163 04/04/2014 0844   TRIG 209.0* 04/04/2014 0844   TRIG 114 04/15/2010   HDL 42.00 04/04/2014 0844   CHOLHDL 4 04/04/2014 0844   VLDL 41.8* 04/04/2014 0844   LDLCALC 73 03/03/2012 1126    CBC    Component Value Date/Time   WBC 8.0 03/28/2014 1147   RBC 4.53 03/28/2014 1147   HGB 14.6 03/28/2014 1147   HCT 44.3 03/28/2014 1147   HCT 41 04/15/2010   PLT 216.0 03/28/2014 1147   MCV 98.0 03/28/2014 1147   MCV 91.9 04/15/2010   MCHC 32.9 03/28/2014 1147   RDW 13.8 03/28/2014 1147   LYMPHSABS 1.8 03/28/2014 1147   MONOABS 0.3 03/28/2014 1147   EOSABS 0.2 03/28/2014 1147   BASOSABS 0.0 03/28/2014 1147    Hgb A1C Lab Results  Component Value Date   HGBA1C 5.5 08/11/2011         Assessment & Plan:   Contact dermatology due to cosmetic product:  Advised her to throw that produce away 80 mg Depo IM today eRx for pred taper  RTC as needed or if symptoms persist or worsen

## 2014-05-15 NOTE — Patient Instructions (Signed)

## 2014-05-15 NOTE — Telephone Encounter (Signed)
Patient Information:  Caller Name: Jasmine DecemberSharon  Phone: [phone]  Patient: Kathryn Mathews, Kathryn Mathews  Gender: Female  DOB: 30-Jan-1946  Age: [age] Years  PCP: Eustaquio BoydenGutierrez, Javier [facility](Family Practice)  Office Follow Up:  Does the office need to follow up with this patient?: No  Instructions For The Office: N/A  RN Note:  Appt scheduled for today(05/15/14) @ 1615 with Nicki Reaperegina Baity NP.  Symptoms  Reason For Call & Symptoms: Calling about red bumpy itchy rash on face that is spreading to eyes, eyelids are  swollen. Started as one red blotch on face 2 days ago and has gotten worse. Wants an appt to be seen today  Reviewed Health History In EMR: Yes  Reviewed Medications In EMR: Yes  Reviewed Allergies In EMR: Yes  Reviewed Surgeries / Procedures: Yes  Date of Onset of Symptoms: 05/15/2014  Guideline(s) Used:  Rash or Redness - Localized  Disposition Per Guideline:   See Today in Office  Reason For Disposition Reached:   Painful rash and has multiple small blisters grouped together in one area of body (i.e., dermatomal distribution or "band" or "stripe")  Advice Given:  N/A  Patient Will Follow Care Advice:  YES  Appointment Scheduled:  05/15/2014 16:15:00 Appointment Scheduled Provider:  Nicki ReaperBaity, Regina

## 2014-05-16 ENCOUNTER — Telehealth: Payer: Self-pay

## 2014-05-16 NOTE — Telephone Encounter (Signed)
I would advise dove sensitive skin for the soap As far as a moisturizer that one is a little tougher but i would say something for sensitive skin that is unscented such as cetaphil or aveeno. I would not put anything on it though until it clears up.

## 2014-05-16 NOTE — Telephone Encounter (Signed)
Pt left v/m; pt was seen 05/15/14 with contact dermatitis.  pt wants to know what kind of soap pt can use to wash face and what type cream can pt put on face to moisturize face. Pt request cb.

## 2014-05-17 NOTE — Telephone Encounter (Signed)
Pt is aware as instructed 

## 2014-05-22 ENCOUNTER — Ambulatory Visit: Payer: Commercial Managed Care - HMO

## 2014-05-23 ENCOUNTER — Ambulatory Visit (INDEPENDENT_AMBULATORY_CARE_PROVIDER_SITE_OTHER): Payer: Commercial Managed Care - HMO | Admitting: Family Medicine

## 2014-05-23 ENCOUNTER — Encounter: Payer: Self-pay | Admitting: Family Medicine

## 2014-05-23 VITALS — BP 126/82 | HR 84 | Temp 97.8°F | Wt 140.8 lb

## 2014-05-23 DIAGNOSIS — R21 Rash and other nonspecific skin eruption: Secondary | ICD-10-CM | POA: Insufficient documentation

## 2014-05-23 DIAGNOSIS — Z86718 Personal history of other venous thrombosis and embolism: Secondary | ICD-10-CM

## 2014-05-23 LAB — PROTIME-INR
INR: 1.1 ratio — ABNORMAL HIGH (ref 0.8–1.0)
Prothrombin Time: 12.5 s (ref 9.6–13.1)

## 2014-05-23 NOTE — Patient Instructions (Signed)
Ok to start aveeno moisturizer around eyes - avoid eye contact. Continue dove soap to wash face. May use aveeno on hand.

## 2014-05-23 NOTE — Progress Notes (Signed)
BP 126/82 mmHg  Pulse 84  Temp(Src) 97.8 F (36.6 C) (Oral)  Wt 140 lb 12 oz (63.844 kg)   CC: f/u facial rash  Subjective:    Patient ID: Kathryn Mathews, female    DOB: 1945-11-24, 68 y.o.   MRN: [id number]  HPI: Kathryn Mathews is a 68 y.o. female presenting on 05/23/2014 for Follow-up   Seen here 05/15/2014 by Rene Kocheregina with contact dermatitis of face after using new eye makeup. Note reviewed. Treated with 80mg  depomedrol IM and pred taper. She threw makeup away. Rash slowly improving. She has been using wash cloth as well.   Denies other rash. Denies fevers/chills, oral lesions, new joint pains. No other new medicines or foods.  Relevant past medical, surgical, family and social history reviewed and updated as indicated.  Allergies and medications reviewed and updated. Current Outpatient Prescriptions on File Prior to Visit  Medication Sig  . acetaminophen (TYLENOL) 500 MG tablet Take 1,000 mg by mouth 2 (two) times daily.  Marland Kitchen. atorvastatin (LIPITOR) 10 MG tablet Take 1 tablet (10 mg total) by mouth at bedtime.  . Biotin 1000 MCG tablet Take 1,000 mcg by mouth 2 (two) times daily.    Marland Kitchen. buPROPion (WELLBUTRIN SR) 150 MG 12 hr tablet Take 1 tablet (150 mg total) by mouth 2 (two) times daily.  . Calcium Carb-Cholecalciferol (CALCIUM-VITAMIN D) 600-400 MG-UNIT TABS Take 1 tablet by mouth daily.  . Cholecalciferol (VITAMIN D) 2000 UNITS CAPS Take 1 capsule (2,000 Units total) by mouth daily.  . clonazePAM (KLONOPIN) 0.5 MG tablet Take 1 tablet (0.5 mg total) by mouth 2 (two) times daily as needed for anxiety.  . docusate sodium (COLACE) 100 MG capsule Take 100 mg by mouth 2 (two) times daily as needed for constipation.  Marland Kitchen. doxepin (SINEQUAN) 50 MG capsule Take 1 capsule (50 mg total) by mouth at bedtime.  . lamoTRIgine (LAMICTAL) 25 MG tablet Take 2 tablets (50 mg total) by mouth 2 (two) times daily.  Marland Kitchen. levothyroxine (SYNTHROID, LEVOTHROID) 50 MCG tablet TAKE 1 TABLET (50 MCG TOTAL) BY MOUTH  DAILY.  Marland Kitchen. polyethylene glycol powder (GLYCOLAX/MIRALAX) powder Take 17 g by mouth 2 (two) times daily as needed.  . warfarin (COUMADIN) 5 MG tablet Take daily as directed by [facility] Clinic.  . clotrimazole-betamethasone (LOTRISONE) cream Apply topically 2 (two) times daily. No more than 2 weeks at a time.   No current facility-administered medications on file prior to visit.    Review of Systems Per HPI unless specifically indicated above    Objective:    BP 126/82 mmHg  Pulse 84  Temp(Src) 97.8 F (36.6 C) (Oral)  Wt 140 lb 12 oz (63.844 kg)  Physical Exam  Constitutional: She appears well-developed and well-nourished. No distress.  HENT:  Head:    Mouth/Throat: Oropharynx is clear and moist. No oropharyngeal exudate.  No oral lesions  Eyes: Conjunctivae and EOM are normal. Pupils are equal, round, and reactive to light.  No palpebral or bulbar conjunctivitis EOMI without pain  Musculoskeletal: She exhibits no edema.  Skin: Skin is warm and dry. Rash noted. There is erythema.  Erythematous rash with some desquamation periorbitally predominantly inferior to eyes No marked edema  Psychiatric: She has a normal mood and affect.  Nursing note and vitals reviewed.  Results for orders placed or performed in visit on 05/01/14  POCT INR  Result Value Ref Range   INR 1.4       Assessment & Plan:   Problem List Items Addressed  This Visit    Facial rash    Agree with current dx of contact dermatitis to new eye makeup product. Discussed ok to use aveeno moisturizer on face. Rec against steroid cream 2/2 proximity to eyes. Continue treatment as up to now, add aveeno onto facial skin Pt agrees with plan. Aware of reasons to seek further care. Not consistent with malar rash as it is too close to eyes/superior eyelids affected as well. If persistent, consider ANA.     Other Visit Diagnoses    History of deep venous thrombosis    -  Primary    Relevant Orders       Protime-INR         Follow up plan: Return if symptoms worsen or fail to improve.

## 2014-05-23 NOTE — Progress Notes (Signed)
Pre visit review using our clinic review tool, if applicable. No additional management support is needed unless otherwise documented below in the visit note. 

## 2014-05-23 NOTE — Assessment & Plan Note (Signed)
Agree with current dx of contact dermatitis to new eye makeup product. Discussed ok to use aveeno moisturizer on face. Rec against steroid cream 2/2 proximity to eyes. Continue treatment as up to now, add aveeno onto facial skin Pt agrees with plan. Aware of reasons to seek further care. Not consistent with malar rash as it is too close to eyes/superior eyelids affected as well. If persistent, consider ANA.

## 2014-05-24 ENCOUNTER — Telehealth: Payer: Self-pay

## 2014-05-24 NOTE — Telephone Encounter (Signed)
Pt was seen on 05/23/14; pt request med to get rid of facial redness; pt request steroid cream and pt promises will not get cream in her eyes. Pt request cb. [facility].

## 2014-05-25 NOTE — Telephone Encounter (Signed)
Attempted to call patient. Left message advising to try plain vaseline or aveeno and call back with an update.

## 2014-05-25 NOTE — Telephone Encounter (Signed)
Any itching? If not, recommend against any prescription cream for this which may worsen rash.  Recommend just plain vaseline or white petroleum jelly or aveeno. Try this first then update us with effect.

## 2014-05-29 ENCOUNTER — Encounter: Payer: Self-pay | Admitting: Family Medicine

## 2014-05-29 ENCOUNTER — Ambulatory Visit (INDEPENDENT_AMBULATORY_CARE_PROVIDER_SITE_OTHER): Payer: Commercial Managed Care - HMO | Admitting: Family Medicine

## 2014-05-29 VITALS — BP 102/74 | HR 71 | Temp 98.3°F | Wt 140.5 lb

## 2014-05-29 DIAGNOSIS — R21 Rash and other nonspecific skin eruption: Secondary | ICD-10-CM

## 2014-05-29 MED ORDER — CLOTRIMAZOLE-BETAMETHASONE 1-0.05 % EX CREA: 1.0000 "application " | TOPICAL_CREAM | Freq: Two times a day (BID) | CUTANEOUS | Status: DC

## 2014-05-29 NOTE — Patient Instructions (Addendum)
I think you have ringworm - treat with lotrisone cream sent to pharmacy. Use twice daily for 2 weeks. Let me know if not improving with this. Good to see you today.  Body Ringworm Ringworm (tinea corporis) is a fungal infection of the skin on the body. This infection is not caused by worms, but is actually caused by a fungus. Fungus normally lives on the top of your skin and can be useful. However, in the case of ringworms, the fungus grows out of control and causes a skin infection. It can involve any area of skin on the body and can spread easily from one person to another (contagious). Ringworm is a common problem for children, but it can affect adults as well. Ringworm is also often found in athletes, especially wrestlers who share equipment and mats.  CAUSES  Ringworm of the body is caused by a fungus called dermatophyte. It can spread by:  Touchingother people who are infected.  Touchinginfected pets.  Touching or sharingobjects that have been in contact with the infected person or pet (hats, combs, towels, clothing, sports equipment). SYMPTOMS   Itchy, raised red spots and bumps on the skin.  Ring-shaped rash.  Redness near the border of the rash with a clear center.  Dry and scaly skin on or around the rash. Not every person develops a ring-shaped rash. Some develop only the red, scaly patches. DIAGNOSIS  Most often, ringworm can be diagnosed by performing a skin exam. Your caregiver may choose to take a skin scraping from the affected area. The sample will be examined under the microscope to see if the fungus is present.  TREATMENT  Body ringworm may be treated with a topical antifungal cream or ointment. Sometimes, an antifungal shampoo that can be used on your body is prescribed. You may be prescribed antifungal medicines to take by mouth if your ringworm is severe, keeps coming back, or lasts a long time.  HOME CARE INSTRUCTIONS   Only take over-the-counter or prescription  medicines as directed by your caregiver.  Wash the infected area and dry it completely before applying yourcream or ointment.  When using antifungal shampoo to treat the ringworm, leave the shampoo on the body for 3-5 minutes before rinsing.   Wear loose clothing to stop clothes from rubbing and irritating the rash.  Wash or change your bed sheets every night while you have the rash.  Have your pet treated by your veterinarian if it has the same infection. To prevent ringworm:   Practice good hygiene.  Wear sandals or shoes in public places and showers.  Do not share personal items with others.  Avoid touching red patches of skin on other people.  Avoid touching pets that have bald spots or wash your hands after doing so. SEEK MEDICAL CARE IF:   Your rash continues to spread after 7 days of treatment.  Your rash is not gone in 4 weeks.  The area around your rash becomes red, warm, tender, and swollen. Document Released: 06/27/2000 Document Revised: 03/24/2012 Document Reviewed: 01/12/2012 [facility] Patient Information 2015 [employer] [place] [place]. This information is not intended to replace advice given to you by your health care provider. Make sure you discuss any questions you have with your health care provider.

## 2014-05-29 NOTE — Assessment & Plan Note (Signed)
Anticipate tinea corporis - discussed with patient. Recent steroid use increases risk of this. Treat with lotrisone. Update if not improving with this treatment. Pt agrees with plan.

## 2014-05-29 NOTE — Progress Notes (Signed)
Pre visit review using our clinic review tool, if applicable. No additional management support is needed unless otherwise documented below in the visit note. 

## 2014-05-29 NOTE — Progress Notes (Signed)
BP 102/74 mmHg  Pulse 71  Temp(Src) 98.3 F (36.8 C) (Oral)  Wt 140 lb 8 oz (63.73 kg)  SpO2 97%   CC: f/u rash  Subjective:    Patient ID: Kathryn Mathews, female    DOB: 11-21-1945, 68 y.o.   MRN: [id number]  HPI: Kathryn Mathews is a 68 y.o. female presenting on 05/29/2014 for Follow-up   See prior note for details. Briefly, presumed contact dermatitis to new eye makeup treated with IM steroid and pred taper. Rash continues to improve.  Pt presents today with new rash L hand that started about 1 week ago. Very itchy. Also spot on L lower abdomen.  No new lotions, detergents, soaps or shampoos, makeup. No new medications.  Relevant past medical, surgical, family and social history reviewed and updated as indicated.  Allergies and medications reviewed and updated. Current Outpatient Prescriptions on File Prior to Visit  Medication Sig  . acetaminophen (TYLENOL) 500 MG tablet Take 1,000 mg by mouth 2 (two) times daily.  Marland Kitchen. atorvastatin (LIPITOR) 10 MG tablet Take 1 tablet (10 mg total) by mouth at bedtime.  . Biotin 1000 MCG tablet Take 1,000 mcg by mouth 2 (two) times daily.    Marland Kitchen. buPROPion (WELLBUTRIN SR) 150 MG 12 hr tablet Take 1 tablet (150 mg total) by mouth 2 (two) times daily.  . Calcium Carb-Cholecalciferol (CALCIUM-VITAMIN D) 600-400 MG-UNIT TABS Take 1 tablet by mouth daily.  . Cholecalciferol (VITAMIN D) 2000 UNITS CAPS Take 1 capsule (2,000 Units total) by mouth daily.  . clonazePAM (KLONOPIN) 0.5 MG tablet Take 1 tablet (0.5 mg total) by mouth 2 (two) times daily as needed for anxiety.  . docusate sodium (COLACE) 100 MG capsule Take 100 mg by mouth 2 (two) times daily as needed for constipation.  Marland Kitchen. doxepin (SINEQUAN) 50 MG capsule Take 1 capsule (50 mg total) by mouth at bedtime.  . lamoTRIgine (LAMICTAL) 25 MG tablet Take 2 tablets (50 mg total) by mouth 2 (two) times daily.  Marland Kitchen. levothyroxine (SYNTHROID, LEVOTHROID) 50 MCG tablet TAKE 1 TABLET (50 MCG TOTAL) BY MOUTH  DAILY.  Marland Kitchen. polyethylene glycol powder (GLYCOLAX/MIRALAX) powder Take 17 g by mouth 2 (two) times daily as needed.  . warfarin (COUMADIN) 5 MG tablet Take daily as directed by [facility]madin Clinic.   No current facility-administered medications on file prior to visit.    Review of Systems Per HPI unless specifically indicated above    Objective:    BP 102/74 mmHg  Pulse 71  Temp(Src) 98.3 F (36.8 C) (Oral)  Wt 140 lb 8 oz (63.73 kg)  SpO2 97%  Physical Exam  Constitutional: She appears well-developed and well-nourished. No distress.  Skin: Skin is warm and dry. Rash noted. There is erythema.  Improved rash on face. L hand with erythematous papular rash with mild central desquamation. Smaller area L lower abdomen. Pruritic.  Nursing note and vitals reviewed.  Results for orders placed or performed in visit on 05/23/14  Protime-INR  Result Value Ref Range   INR 1.1 (H) 0.8 - 1.0 ratio   Prothrombin Time 12.5 9.6 - 13.1 sec      Assessment & Plan:   Problem List Items Addressed This Visit    Skin rash - Primary    Anticipate tinea corporis - discussed with patient. Recent steroid use increases risk of this. Treat with lotrisone. Update if not improving with this treatment. Pt agrees with plan.        Follow up plan: Return if symptoms  worsen or fail to improve.

## 2014-06-06 ENCOUNTER — Other Ambulatory Visit (INDEPENDENT_AMBULATORY_CARE_PROVIDER_SITE_OTHER): Payer: Commercial Managed Care - HMO

## 2014-06-06 DIAGNOSIS — Z7901 Long term (current) use of anticoagulants: Secondary | ICD-10-CM

## 2014-06-06 DIAGNOSIS — Z5181 Encounter for therapeutic drug level monitoring: Secondary | ICD-10-CM

## 2014-06-06 DIAGNOSIS — I739 Peripheral vascular disease, unspecified: Secondary | ICD-10-CM

## 2014-06-06 LAB — PROTIME-INR
INR: 2.5 ratio — ABNORMAL HIGH (ref 0.8–1.0)
Prothrombin Time: 26.6 s — ABNORMAL HIGH (ref 9.6–13.1)

## 2014-06-26 ENCOUNTER — Other Ambulatory Visit (INDEPENDENT_AMBULATORY_CARE_PROVIDER_SITE_OTHER): Payer: Commercial Managed Care - HMO

## 2014-06-26 DIAGNOSIS — I739 Peripheral vascular disease, unspecified: Secondary | ICD-10-CM

## 2014-06-26 DIAGNOSIS — Z7901 Long term (current) use of anticoagulants: Secondary | ICD-10-CM

## 2014-06-26 DIAGNOSIS — Z5181 Encounter for therapeutic drug level monitoring: Secondary | ICD-10-CM

## 2014-06-26 LAB — PROTIME-INR
INR: 1.7 ratio — AB (ref 0.8–1.0)
PROTHROMBIN TIME: 18.7 s — AB (ref 9.6–13.1)

## 2014-07-03 ENCOUNTER — Ambulatory Visit: Payer: Commercial Managed Care - HMO

## 2014-07-17 ENCOUNTER — Ambulatory Visit (INDEPENDENT_AMBULATORY_CARE_PROVIDER_SITE_OTHER): Payer: Commercial Managed Care - HMO | Admitting: Family Medicine

## 2014-07-17 DIAGNOSIS — I739 Peripheral vascular disease, unspecified: Secondary | ICD-10-CM

## 2014-07-17 DIAGNOSIS — Z7901 Long term (current) use of anticoagulants: Secondary | ICD-10-CM

## 2014-07-17 DIAGNOSIS — Z5181 Encounter for therapeutic drug level monitoring: Secondary | ICD-10-CM

## 2014-07-17 LAB — POCT INR: INR: 2.7

## 2014-08-14 ENCOUNTER — Other Ambulatory Visit (INDEPENDENT_AMBULATORY_CARE_PROVIDER_SITE_OTHER): Payer: Commercial Managed Care - HMO

## 2014-08-14 DIAGNOSIS — Z5181 Encounter for therapeutic drug level monitoring: Secondary | ICD-10-CM | POA: Diagnosis not present

## 2014-08-14 DIAGNOSIS — Z7901 Long term (current) use of anticoagulants: Secondary | ICD-10-CM | POA: Diagnosis not present

## 2014-08-14 DIAGNOSIS — I739 Peripheral vascular disease, unspecified: Secondary | ICD-10-CM | POA: Diagnosis not present

## 2014-08-14 HISTORY — PX: CARDIAC CATHETERIZATION: SHX172

## 2014-08-14 LAB — POCT INR: INR: 2.1

## 2014-08-14 NOTE — Progress Notes (Signed)
PT/INR=2.1. Possibly 1 missed dose; no antibiotics. 5mg  QD

## 2014-09-01 ENCOUNTER — Inpatient Hospital Stay (HOSPITAL_COMMUNITY)
Admission: EM | Admit: 2014-09-01 | Discharge: 2014-09-05 | DRG: 287 | Disposition: A | Discharge status: Home / Self Care | Payer: Commercial Managed Care - HMO | Attending: Internal Medicine | Admitting: Internal Medicine

## 2014-09-01 ENCOUNTER — Emergency Department (HOSPITAL_COMMUNITY): Payer: Commercial Managed Care - HMO

## 2014-09-01 ENCOUNTER — Encounter (HOSPITAL_COMMUNITY): Payer: Self-pay | Admitting: Emergency Medicine

## 2014-09-01 DIAGNOSIS — Z885 Allergy status to narcotic agent status: Secondary | ICD-10-CM

## 2014-09-01 DIAGNOSIS — I10 Essential (primary) hypertension: Secondary | ICD-10-CM | POA: Diagnosis present

## 2014-09-01 DIAGNOSIS — E039 Hypothyroidism, unspecified: Secondary | ICD-10-CM | POA: Diagnosis present

## 2014-09-01 DIAGNOSIS — R079 Chest pain, unspecified: Secondary | ICD-10-CM | POA: Diagnosis not present

## 2014-09-01 DIAGNOSIS — D649 Anemia, unspecified: Secondary | ICD-10-CM | POA: Diagnosis present

## 2014-09-01 DIAGNOSIS — E780 Pure hypercholesterolemia: Secondary | ICD-10-CM | POA: Diagnosis present

## 2014-09-01 DIAGNOSIS — Z7982 Long term (current) use of aspirin: Secondary | ICD-10-CM

## 2014-09-01 DIAGNOSIS — Z8249 Family history of ischemic heart disease and other diseases of the circulatory system: Secondary | ICD-10-CM

## 2014-09-01 DIAGNOSIS — R0602 Shortness of breath: Secondary | ICD-10-CM | POA: Insufficient documentation

## 2014-09-01 DIAGNOSIS — R001 Bradycardia, unspecified: Secondary | ICD-10-CM | POA: Diagnosis present

## 2014-09-01 DIAGNOSIS — I25110 Atherosclerotic heart disease of native coronary artery with unstable angina pectoris: Secondary | ICD-10-CM | POA: Diagnosis present

## 2014-09-01 DIAGNOSIS — Z7901 Long term (current) use of anticoagulants: Secondary | ICD-10-CM

## 2014-09-01 DIAGNOSIS — Z86718 Personal history of other venous thrombosis and embolism: Secondary | ICD-10-CM | POA: Diagnosis not present

## 2014-09-01 DIAGNOSIS — R931 Abnormal findings on diagnostic imaging of heart and coronary circulation: Secondary | ICD-10-CM | POA: Diagnosis not present

## 2014-09-01 DIAGNOSIS — E785 Hyperlipidemia, unspecified: Secondary | ICD-10-CM | POA: Diagnosis present

## 2014-09-01 DIAGNOSIS — M542 Cervicalgia: Secondary | ICD-10-CM | POA: Diagnosis not present

## 2014-09-01 DIAGNOSIS — R072 Precordial pain: Secondary | ICD-10-CM | POA: Diagnosis not present

## 2014-09-01 DIAGNOSIS — I200 Unstable angina: Secondary | ICD-10-CM | POA: Diagnosis not present

## 2014-09-01 DIAGNOSIS — R918 Other nonspecific abnormal finding of lung field: Secondary | ICD-10-CM | POA: Diagnosis not present

## 2014-09-01 DIAGNOSIS — E782 Mixed hyperlipidemia: Secondary | ICD-10-CM | POA: Diagnosis present

## 2014-09-01 DIAGNOSIS — Z79899 Other long term (current) drug therapy: Secondary | ICD-10-CM | POA: Diagnosis not present

## 2014-09-01 DIAGNOSIS — Z86711 Personal history of pulmonary embolism: Secondary | ICD-10-CM

## 2014-09-01 DIAGNOSIS — I259 Chronic ischemic heart disease, unspecified: Secondary | ICD-10-CM | POA: Diagnosis not present

## 2014-09-01 DIAGNOSIS — R011 Cardiac murmur, unspecified: Secondary | ICD-10-CM | POA: Diagnosis not present

## 2014-09-01 DIAGNOSIS — R9439 Abnormal result of other cardiovascular function study: Secondary | ICD-10-CM | POA: Diagnosis not present

## 2014-09-01 HISTORY — DX: Hypothyroidism, unspecified: E03.9

## 2014-09-01 HISTORY — DX: Pure hypercholesterolemia, unspecified: E78.00

## 2014-09-01 HISTORY — DX: Other pulmonary embolism without acute cor pulmonale: I26.99

## 2014-09-01 HISTORY — DX: Depression, unspecified: F32.A

## 2014-09-01 HISTORY — DX: Unspecified fracture of shaft of humerus, unspecified arm, initial encounter for closed fracture: S42.309A

## 2014-09-01 HISTORY — DX: Major depressive disorder, single episode, unspecified: F32.9

## 2014-09-01 LAB — CBC
HEMATOCRIT: 42.1 % (ref 36.0–46.0)
HEMOGLOBIN: 14 g/dL (ref 12.0–15.0)
MCH: 32.7 pg (ref 26.0–34.0)
MCHC: 33.3 g/dL (ref 30.0–36.0)
MCV: 98.4 fL (ref 78.0–100.0)
Platelets: 147 10*3/uL — ABNORMAL LOW (ref 150–400)
RBC: 4.28 MIL/uL (ref 3.87–5.11)
RDW: 13 % (ref 11.5–15.5)
WBC: 7.3 10*3/uL (ref 4.0–10.5)

## 2014-09-01 LAB — BASIC METABOLIC PANEL
Anion gap: 9 (ref 5–15)
BUN: 14 mg/dL (ref 6–23)
CALCIUM: 9.8 mg/dL (ref 8.4–10.5)
CO2: 25 mmol/L (ref 19–32)
CREATININE: 0.77 mg/dL (ref 0.50–1.10)
Chloride: 104 mmol/L (ref 96–112)
GFR calc Af Amer: >90 mL/min (ref >90)
GFR, EST NON AFRICAN AMERICAN: 84 mL/min — AB (ref >90)
Glucose, Bld: 94 mg/dL (ref 70–99)
Potassium: 4.6 mmol/L (ref 3.5–5.1)
Sodium: 138 mmol/L (ref 135–145)

## 2014-09-01 LAB — I-STAT TROPONIN, ED
TROPONIN I, POC: 0 ng/mL (ref 0.00–0.08)
TROPONIN I, POC: 0 ng/mL (ref 0.00–0.08)

## 2014-09-01 MED ORDER — GI COCKTAIL ~~LOC~~
30.0000 mL | Freq: Once | ORAL | Status: AC
  Administered 2014-09-01: 30 mL via ORAL
  Filled 2014-09-01: qty 30

## 2014-09-01 MED ORDER — NITROGLYCERIN 0.4 MG SL SUBL
0.4000 mg | SUBLINGUAL_TABLET | SUBLINGUAL | Status: DC | PRN
Start: 2014-09-01 — End: 2014-09-05
  Filled 2014-09-01: qty 1

## 2014-09-01 MED ORDER — ASPIRIN 81 MG PO CHEW: 324.0000 mg | CHEWABLE_TABLET | Freq: Once | ORAL | Status: DC

## 2014-09-01 MED ORDER — MORPHINE SULFATE 2 MG/ML IJ SOLN
2.0000 mg | Freq: Once | INTRAMUSCULAR | Status: AC
  Administered 2014-09-01: 2 mg via INTRAVENOUS
  Filled 2014-09-01: qty 1

## 2014-09-01 NOTE — Discharge Instructions (Signed)

## 2014-09-01 NOTE — ED Provider Notes (Signed)
CSN: [id number]     Arrival date & time 09/01/14  1833 History   First MD Initiated Contact with Patient 09/01/14 1835     Chief Complaint  Patient presents with  . Chest Pain     (Consider location/radiation/quality/duration/timing/severity/associated sxs/prior Treatment) HPI Comments: Awoke with CP radiating up into her neck and jaw. No alleviating or exacerbating factors. CP has stopped, however still having throat pain.  Patient is a 69 y.o. female presenting with chest pain. The history is provided by the patient.  Chest Pain Pain location:  Substernal area Pain quality: not burning, not hot, no pressure and not shooting   Pain quality comment:  Like's there's a lump in her throat Pain radiates to:  Neck and R jaw (R face and head) Pain radiates to the back: no   Pain severity:  Moderate Onset quality:  Sudden Timing:  Intermittent Progression:  Worsening Associated symptoms: no abdominal pain, no cough, no fever, no shortness of breath and not vomiting     Past Medical History  Diagnosis Date  . High cholesterol   . Hypertension   . Anxiety   . Depression    Past Surgical History  Procedure Laterality Date  . Cesarean section    . Cholecystectomy     No family history on file. History  Substance Use Topics  . Smoking status: Never Smoker   . Smokeless tobacco: Not on file  . Alcohol Use: No   OB History    No data available     Review of Systems  Constitutional: Negative for fever.  Respiratory: Negative for cough and shortness of breath.   Cardiovascular: Positive for chest pain.  Gastrointestinal: Negative for vomiting and abdominal pain.  All other systems reviewed and are negative.     Allergies  Review of patient's allergies indicates not on file.  Home Medications   Prior to Admission medications   Not on File   BP 133/61 mmHg  Pulse 74  Temp(Src) 98.3 F (36.8 C) (Oral)  Resp 14  Ht  (1.448 m)  Wt 142 lb (64.411 kg)  BMI  30.72 kg/m2  SpO2 100% Physical Exam  Constitutional: She is oriented to person, place, and time. She appears well-developed and well-nourished. No distress.  HENT:  Head: Normocephalic and atraumatic.  Mouth/Throat: Oropharynx is clear and moist.  Eyes: EOM are normal. Pupils are equal, round, and reactive to light.  Neck: Normal range of motion. Neck supple.  Cardiovascular: Normal rate and regular rhythm.  Exam reveals no friction rub.   No murmur heard. Pulmonary/Chest: Effort normal and breath sounds normal. No respiratory distress. She has no wheezes. She has no rales.  Abdominal: Soft. She exhibits no distension. There is no tenderness. There is no rebound.  Musculoskeletal: Normal range of motion. She exhibits no edema.  Neurological: She is alert and oriented to person, place, and time.  Skin: No rash noted. She is not diaphoretic.  Nursing note and vitals reviewed.   ED Course  Procedures (including critical care time) Labs Review Labs Reviewed  CBC  BASIC METABOLIC PANEL  Rosezena Sensor, ED    Imaging Review Dg Chest 2 View  09/01/2014   CLINICAL DATA:  Midchest pain radiating into throat.  EXAM: CHEST  2 VIEW  COMPARISON:  None.  FINDINGS: Mild linear right base opacities may be scarring or atelectasis. There is no confluent airspace opacity. Emphysematous changes are present in the upper lobes. Hilar, mediastinal and cardiac contours appear unremarkable. There  are no pleural effusions.  IMPRESSION: Mild linear right base opacity due to scarring or atelectasis.   Electronically Signed   By: Ellery Plunkaniel R Mitchell M.D.   On: 09/01/2014 21:11     EKG Interpretation   Date/Time:  Friday September 01 2014 18:40:10 EST Ventricular Rate:  86 PR Interval:  248 QRS Duration: 91 QT Interval:  373 QTC Calculation: 446 R Axis:   -23 Text Interpretation:  Sinus rhythm Ventricular premature complex Prolonged  PR interval Borderline left axis deviation Low voltage, precordial  leads  No prior for comparison Confirmed by Gwendolyn GrantWALDEN  MD, Sharifa Bucholz ([id number]) on 09/01/2014  7:00:12 PM      MDM   Final diagnoses:  Shortness of breath  Chest pain, unspecified chest pain type    28F here with chest pain. Began this morning, gone now, however the pain in her throat and neck that originally radiated up persists. No fevers, vomiting, nausea, diarrhea. CP earlier described as a feeling like a lump was in her throat. Was never vomiting, wasn't eating at onset. No dysphagia. History not consistent with food impaction. AFVSS here. Exam benign. EKG ok. Will check labs. With radiation into neck and jaw area, concern for anginal component.  Serial troponins normal. Patient's pain is continuing. Patient will be admitted for ACS r/o.  Elwin MochaBlair Tellis Spivak, MD 09/02/14 Perlie Mayo0020

## 2014-09-01 NOTE — ED Notes (Addendum)
Pt to ED via [facility] with c/o mid chest pain radiating into throat.  Pt st's during the day her chest stopped hurting but continues to have the pain in her throat.  Pt st's the pain goes into the right side of her face and right ear.  Pt denies any nausea, vomiting, shortness of breath or diaphoresis.  EMS gave pt ASA 324mg .

## 2014-09-02 ENCOUNTER — Observation Stay (HOSPITAL_COMMUNITY): Payer: Commercial Managed Care - HMO

## 2014-09-02 ENCOUNTER — Encounter (HOSPITAL_COMMUNITY): Payer: Self-pay | Admitting: Internal Medicine

## 2014-09-02 DIAGNOSIS — I259 Chronic ischemic heart disease, unspecified: Secondary | ICD-10-CM | POA: Diagnosis not present

## 2014-09-02 DIAGNOSIS — R011 Cardiac murmur, unspecified: Secondary | ICD-10-CM

## 2014-09-02 DIAGNOSIS — I200 Unstable angina: Secondary | ICD-10-CM

## 2014-09-02 DIAGNOSIS — I209 Angina pectoris, unspecified: Secondary | ICD-10-CM

## 2014-09-02 DIAGNOSIS — R079 Chest pain, unspecified: Secondary | ICD-10-CM

## 2014-09-02 DIAGNOSIS — I10 Essential (primary) hypertension: Secondary | ICD-10-CM | POA: Diagnosis present

## 2014-09-02 LAB — COMPREHENSIVE METABOLIC PANEL
ALT: 13 U/L (ref 0–35)
AST: 23 U/L (ref 0–37)
Albumin: 3.5 g/dL (ref 3.5–5.2)
Alkaline Phosphatase: 57 U/L (ref 39–117)
Anion gap: 6 (ref 5–15)
BUN: 12 mg/dL (ref 6–23)
CO2: 26 mmol/L (ref 19–32)
Calcium: 9.2 mg/dL (ref 8.4–10.5)
Chloride: 103 mmol/L (ref 96–112)
Creatinine, Ser: 0.75 mg/dL (ref 0.50–1.10)
GFR calc Af Amer: >90 mL/min (ref >90)
GFR calc non Af Amer: 85 mL/min — ABNORMAL LOW (ref >90)
Glucose, Bld: 97 mg/dL (ref 70–99)
Potassium: 4.1 mmol/L (ref 3.5–5.1)
SODIUM: 135 mmol/L (ref 135–145)
Total Bilirubin: 0.8 mg/dL (ref 0.3–1.2)
Total Protein: 6.1 g/dL (ref 6.0–8.3)

## 2014-09-02 LAB — CBC
HCT: 40.7 % (ref 36.0–46.0)
Hemoglobin: 13.3 g/dL (ref 12.0–15.0)
MCH: 31.5 pg (ref 26.0–34.0)
MCHC: 32.7 g/dL (ref 30.0–36.0)
MCV: 96.4 fL (ref 78.0–100.0)
PLATELETS: 136 10*3/uL — AB (ref 150–400)
RBC: 4.22 MIL/uL (ref 3.87–5.11)
RDW: 13.1 % (ref 11.5–15.5)
WBC: 6.4 10*3/uL (ref 4.0–10.5)

## 2014-09-02 LAB — LIPID PANEL
CHOLESTEROL: 153 mg/dL (ref 0–200)
CHOLESTEROL: 153 mg/dL (ref 0–200)
HDL: 41 mg/dL (ref 35–70)
HDL: 41 mg/dL (ref >39)
LDL CALC: 84 mg/dL (ref 0–99)
LDL Cholesterol: 84 mg/dL
Total CHOL/HDL Ratio: 3.7 RATIO
Triglycerides: 138 mg/dL (ref 40–160)
Triglycerides: 138 mg/dL (ref <150)
VLDL: 28 mg/dL (ref 0–40)

## 2014-09-02 LAB — TROPONIN I: Troponin I: <0.03 ng/mL (ref <0.031)

## 2014-09-02 LAB — HEPARIN LEVEL (UNFRACTIONATED): Heparin Unfractionated: 0.55 IU/mL (ref 0.30–0.70)

## 2014-09-02 LAB — PROTIME-INR
INR: 1.64 — AB (ref 0.00–1.49)
INR: 1.58 — ABNORMAL HIGH (ref 0.00–1.49)
Prothrombin Time: 19.6 seconds — ABNORMAL HIGH (ref 11.6–15.2)
Prothrombin Time: 19 seconds — ABNORMAL HIGH (ref 11.6–15.2)

## 2014-09-02 LAB — TSH: TSH: 1.47 u[IU]/mL (ref <=5.90)

## 2014-09-02 LAB — MRSA PCR SCREENING: MRSA by PCR: NEGATIVE

## 2014-09-02 LAB — APTT: APTT: 47 s — AB (ref 24–37)

## 2014-09-02 MED ORDER — BIOTIN 1000 MCG PO TABS: 1000.0000 ug | ORAL_TABLET | Freq: Two times a day (BID) | ORAL | Status: DC

## 2014-09-02 MED ORDER — TECHNETIUM TC 99M SESTAMIBI - CARDIOLITE
30.0000 | Freq: Once | INTRAVENOUS | Status: AC | PRN
  Administered 2014-09-02: 11:00:00 30 via INTRAVENOUS

## 2014-09-02 MED ORDER — HEPARIN BOLUS VIA INFUSION
3000.0000 [IU] | Freq: Once | INTRAVENOUS | Status: AC
  Administered 2014-09-02: 3000 [IU] via INTRAVENOUS
  Filled 2014-09-02: qty 3000

## 2014-09-02 MED ORDER — NITROGLYCERIN 2 % TD OINT
0.5000 [in_us] | TOPICAL_OINTMENT | Freq: Three times a day (TID) | TRANSDERMAL | Status: DC
  Administered 2014-09-02 – 2014-09-04 (×8): 0.5 [in_us] via TOPICAL
  Filled 2014-09-02 (×2): qty 30
  Filled 2014-09-02: qty 1

## 2014-09-02 MED ORDER — LAMOTRIGINE 100 MG PO TABS
100.0000 mg | ORAL_TABLET | Freq: Two times a day (BID) | ORAL | Status: DC
  Administered 2014-09-02 – 2014-09-04 (×7): 100 mg via ORAL
  Filled 2014-09-02 (×9): qty 1

## 2014-09-02 MED ORDER — HYDROCODONE-ACETAMINOPHEN 5-325 MG PO TABS
ORAL_TABLET | ORAL | Status: AC
  Administered 2014-09-02: 1 via ORAL
  Filled 2014-09-02: qty 1

## 2014-09-02 MED ORDER — PERFLUTREN LIPID MICROSPHERE
1.0000 mL | INTRAVENOUS | Status: AC | PRN
  Filled 2014-09-02 (×2): qty 10

## 2014-09-02 MED ORDER — REGADENOSON 0.4 MG/5ML IV SOLN
INTRAVENOUS | Status: AC
  Filled 2014-09-02: qty 5

## 2014-09-02 MED ORDER — ZOLPIDEM TARTRATE 5 MG PO TABS
5.0000 mg | ORAL_TABLET | Freq: Every evening | ORAL | Status: DC | PRN
Start: 2014-09-02 — End: 2014-09-05
  Administered 2014-09-02 (×2): 5 mg via ORAL
  Filled 2014-09-02 (×2): qty 1

## 2014-09-02 MED ORDER — NITROGLYCERIN 2 % TD OINT: 0.5000 [in_us] | TOPICAL_OINTMENT | Freq: Three times a day (TID) | TRANSDERMAL | Status: DC

## 2014-09-02 MED ORDER — REGADENOSON 0.4 MG/5ML IV SOLN
0.4000 mg | Freq: Once | INTRAVENOUS | Status: AC
  Administered 2014-09-02: 0.4 mg via INTRAVENOUS

## 2014-09-02 MED ORDER — NITROGLYCERIN 0.4 MG SL SUBL: 0.4000 mg | SUBLINGUAL_TABLET | SUBLINGUAL | Status: DC | PRN

## 2014-09-02 MED ORDER — GI COCKTAIL ~~LOC~~
30.0000 mL | Freq: Once | ORAL | Status: AC
Start: 2014-09-02 — End: 2014-09-02
  Administered 2014-09-02: 30 mL via ORAL
  Filled 2014-09-02: qty 30

## 2014-09-02 MED ORDER — PANTOPRAZOLE SODIUM 40 MG PO TBEC
40.0000 mg | DELAYED_RELEASE_TABLET | Freq: Every day | ORAL | Status: DC
  Administered 2014-09-02 – 2014-09-04 (×3): 40 mg via ORAL
  Filled 2014-09-02 (×3): qty 1

## 2014-09-02 MED ORDER — BUPROPION HCL ER (SR) 100 MG PO TB12
200.0000 mg | ORAL_TABLET | Freq: Two times a day (BID) | ORAL | Status: DC
  Administered 2014-09-02 – 2014-09-04 (×7): 200 mg via ORAL
  Filled 2014-09-02 (×9): qty 2

## 2014-09-02 MED ORDER — CLONAZEPAM 0.5 MG PO TABS
1.0000 mg | ORAL_TABLET | Freq: Two times a day (BID) | ORAL | Status: DC
  Administered 2014-09-02 – 2014-09-04 (×7): 1 mg via ORAL
  Filled 2014-09-02 (×7): qty 1

## 2014-09-02 MED ORDER — WARFARIN SODIUM 6 MG PO TABS
6.0000 mg | ORAL_TABLET | Freq: Once | ORAL | Status: AC
  Administered 2014-09-02: 6 mg via ORAL
  Filled 2014-09-02: qty 1

## 2014-09-02 MED ORDER — ATORVASTATIN CALCIUM 10 MG PO TABS
10.0000 mg | ORAL_TABLET | Freq: Every day | ORAL | Status: DC
  Administered 2014-09-02 – 2014-09-03 (×2): 10 mg via ORAL
  Filled 2014-09-02 (×3): qty 1

## 2014-09-02 MED ORDER — WARFARIN 0.5 MG HALF TABLET
1.5000 mg | ORAL_TABLET | Freq: Once | ORAL | Status: DC
  Filled 2014-09-02: qty 1

## 2014-09-02 MED ORDER — TECHNETIUM TC 99M SESTAMIBI GENERIC - CARDIOLITE
10.0000 | Freq: Once | INTRAVENOUS | Status: AC | PRN
  Administered 2014-09-02: 10 via INTRAVENOUS

## 2014-09-02 MED ORDER — HYDROCODONE-ACETAMINOPHEN 5-325 MG PO TABS
1.0000 | ORAL_TABLET | Freq: Four times a day (QID) | ORAL | Status: DC | PRN
  Administered 2014-09-02 (×2): 1 via ORAL
  Filled 2014-09-02: qty 1

## 2014-09-02 MED ORDER — WARFARIN - PHARMACIST DOSING INPATIENT: Freq: Every day | Status: DC

## 2014-09-02 MED ORDER — LEVOTHYROXINE SODIUM 50 MCG PO TABS
50.0000 ug | ORAL_TABLET | Freq: Every day | ORAL | Status: DC
  Administered 2014-09-02 – 2014-09-04 (×3): 50 ug via ORAL
  Filled 2014-09-02 (×5): qty 1

## 2014-09-02 MED ORDER — ASPIRIN 81 MG PO CHEW
81.0000 mg | CHEWABLE_TABLET | Freq: Every day | ORAL | Status: DC
  Administered 2014-09-02 – 2014-09-05 (×4): 81 mg via ORAL
  Filled 2014-09-02 (×4): qty 1

## 2014-09-02 MED ORDER — HEPARIN (PORCINE) IN NACL 100-0.45 UNIT/ML-% IJ SOLN
450.0000 [IU]/h | INTRAMUSCULAR | Status: DC
  Administered 2014-09-02 – 2014-09-03 (×2): 650 [IU]/h via INTRAVENOUS
  Filled 2014-09-02 (×3): qty 250

## 2014-09-02 NOTE — Progress Notes (Signed)
ANTICOAGULATION CONSULT NOTE - Follow Up Consult  Pharmacy Consult for heparin Indication: hx of VTE  Allergies  Allergen Reactions  . Codeine Nausea And Vomiting    Patient Measurements: Height: 4\' 9"  (144.8 cm) Weight: 142 lb (64.411 kg) IBW/kg (Calculated) : 38.6 Heparin Dosing Weight: 53 kg  Vital Signs: Temp: 98.6 F (37 C) (02/20 1923) Temp Source: Oral (02/20 1923) BP: 90/65 mmHg (02/20 1700) Pulse Rate: 70 (02/20 1923)  Labs:  Recent Labs  09/01/14 1853 09/02/14 0142 09/02/14 0312 09/02/14 0751 09/02/14 1440 09/02/14 2108  HGB 14.0  --   --  13.3  --   --   HCT 42.1  --   --  40.7  --   --   PLT 147*  --   --  136*  --   --   APTT  --   --  47*  --   --   --   LABPROT  --  19.6* 19.0*  --   --   --   INR  --  1.64* 1.58*  --   --   --   HEPARINUNFRC  --   --   --   --   --  0.55  CREATININE 0.77  --   --  0.75  --   --   TROPONINI  --   --  <0.03 <0.03 <0.03  --     Estimated Creatinine Clearance: 52 mL/min (by C-G formula based on Cr of 0.75).   Medications:  Scheduled:  . aspirin  81 mg Oral Daily  . atorvastatin  10 mg Oral q1800  . buPROPion  200 mg Oral BID  . clonazePAM  1 mg Oral BID  . lamoTRIgine  100 mg Oral BID  . levothyroxine  50 mcg Oral QAC breakfast  . nitroGLYCERIN  0.5 inch Topical 3 times per day  . pantoprazole  40 mg Oral Daily  . regadenoson       Infusions:  . 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  Heparin level is 0.55 Goal of Therapy:  Heparin level 0.3-0.7 units/ml Monitor platelets by anticoagulation protocol: Yes   Plan:  - continue heparin at 650 units/hr - heparin level in am  Chantalle Defilippo, Tsz-Yin 09/02/2014,10:04 PM

## 2014-09-02 NOTE — Progress Notes (Signed)
  Echocardiogram 2D Echocardiogram has been performed.  Janalyn HarderWest, Albertha Beattie R 09/02/2014, 4:26 PM

## 2014-09-02 NOTE — Progress Notes (Signed)
[facility] PROGRESS NOTE  Kathryn DuckingSharon J Mathews WUJ:[id number] DOB: Jun 13, 1946 DOA: 09/01/2014 PCP: Eustaquio BoydenJavier Gutierrez, MD Interim summary: 69 yo female admitted for chest pain.  Assessment/Plan: Unstable chest pain:  Admitted to 2h. Stress test done today abnormal with decreased activity of the  inferior lateral wall . Stopped coumadin and started IV heparin and called cardiology. Her VSS. Her chest pain is persistent, and she might need a cardiac cath.    Hypertension Controlled.    Hypothyroidism: Resume synthroid.      Code Status: full code.  Family Communication: family at bedside Disposition Plan: pending. Further management.    Consultants:  Cardiology.   Procedures:  Stress test  Echocardiogram.  Antibiotics:  none  HPI/Subjective: Reports precordial chest pain. No sob or cough.   Objective: Filed Vitals:   09/02/14 1207  BP: 94/72  Pulse: 64  Temp: 97.5 F (36.4 C)  Resp: 12    Intake/Output Summary (Last 24 hours) at 09/02/14 1524 Last data filed at 09/02/14 1111  Gross per 24 hour  Intake    240 ml  Output    215 ml  Net     25 ml   Filed Weights   09/01/14 1844  Weight: 64.411 kg (142 lb)    Exam:   General:  Alert afebrile in mild distress  Cardiovascular: s1s2, slight systolic murmer  Respiratory: ctab, no wheezing or rhonchi  Abdomen: soft non tender non distended bowel sounds  Musculoskeletal: no pedal edema.   Data Reviewed: Basic Metabolic Panel:  Recent Labs Lab 09/01/14 1853 09/02/14 0751  NA 138 135  K 4.6 4.1  CL 104 103  CO2 25 26  GLUCOSE 94 97  BUN 14 12  CREATININE 0.77 0.75  CALCIUM 9.8 9.2   Liver Function Tests:  Recent Labs Lab 09/02/14 0751  AST 23  ALT 13  ALKPHOS 57  BILITOT 0.8  PROT 6.1  ALBUMIN 3.5   No results for input(s): LIPASE, AMYLASE in the last 168 hours. No results for input(s): AMMONIA in the last 168 hours. CBC:  Recent Labs Lab 09/01/14 1853 09/02/14 0751   WBC 7.3 6.4  HGB 14.0 13.3  HCT 42.1 40.7  MCV 98.4 96.4  PLT 147* 136*   Cardiac Enzymes:  Recent Labs Lab 09/02/14 0312 09/02/14 0751  TROPONINI <0.03 <0.03   BNP (last 3 results) No results for input(s): BNP in the last 8760 hours.  ProBNP (last 3 results) No results for input(s): PROBNP in the last 8760 hours.  CBG: No results for input(s): GLUCAP in the last 168 hours.  Recent Results (from the past 240 hour(s))  MRSA PCR Screening     Status: None   Collection Time: 09/02/14  2:02 AM  Result Value Ref Range Status   MRSA by PCR NEGATIVE NEGATIVE Final    Comment:        The GeneXpert MRSA Assay (FDA approved for NASAL specimens only), is one component of a comprehensive MRSA colonization surveillance program. It is not intended to diagnose MRSA infection nor to guide or monitor treatment for MRSA infections.      Studies: Dg Chest 2 View  09/01/2014   CLINICAL DATA:  Midchest pain radiating into throat.  EXAM: CHEST  2 VIEW  COMPARISON:  None.  FINDINGS: Mild linear right base opacities may be scarring or atelectasis. There is no confluent airspace opacity. Emphysematous changes are present in the upper lobes. Hilar, mediastinal and cardiac contours appear unremarkable. There are no pleural effusions.  IMPRESSION: Mild linear right base opacity due to scarring or atelectasis.   Electronically Signed   By: Ellery Plunk M.D.   On: 09/01/2014 21:11   Nm Myocar Multi W/spect W/wall Motion / Ef  09/02/2014   CLINICAL DATA:  Acute chest pain  EXAM: MYOCARDIAL IMAGING WITH SPECT (REST AND PHARMACOLOGIC-STRESS)  GATED LEFT VENTRICULAR WALL MOTION STUDY  LEFT VENTRICULAR EJECTION FRACTION  TECHNIQUE: Standard myocardial SPECT imaging was performed after resting intravenous injection of 10 mCi Tc-55m sestamibi. Subsequently, intravenous infusion of Lexiscan was performed under the supervision of the Cardiology staff. At peak effect of the drug, 30 mCi Tc-25m sestamibi  was injected intravenously and standard myocardial SPECT imaging was performed. Quantitative gated imaging was also performed to evaluate left ventricular wall motion, and estimate left ventricular ejection fraction.  COMPARISON:  None.  FINDINGS: Perfusion: Decreased activity of the inferior lateral wall extending into the apex on the stress views when compared to the rest, compatible with inducible ischemia with pharmacologic stress.  Wall Motion: Normal left ventricular wall motion. No left ventricular dilation.  Left Ventricular Ejection Fraction: 87 %  End diastolic volume 47 ml  End systolic volume 6 ml  IMPRESSION: 1. Positive exam for inferior lateral wall inducible ischemia extending into the apex with pharmacologic stress.  2. Normal left ventricular wall motion.  3. Left ventricular ejection fraction 87%  4. Intermediate-risk stress test findings*.  *2012 Appropriate Use Criteria for Coronary Revascularization Focused Update: J Am Coll Cardiol. 2012;59(9):857-881. http://content.dementiazones.com.aspx?articleid=1201161   Electronically Signed   By: Judie Petit.  Shick M.D.   On: 09/02/2014 13:36    Scheduled Meds: . aspirin  81 mg Oral Daily  . atorvastatin  10 mg Oral q1800  . buPROPion  200 mg Oral BID  . clonazePAM  1 mg Oral BID  . gi cocktail  30 mL Oral Once  . heparin  3,000 Units Intravenous Once  . lamoTRIgine  100 mg Oral BID  . levothyroxine  50 mcg Oral QAC breakfast  . nitroGLYCERIN  0.5 inch Topical 3 times per day  . pantoprazole  40 mg Oral Daily  . regadenoson       Continuous Infusions: . heparin      Active Problems:   Chest pain   Hypertension   Hyperlipidemia    Time spent: 25 min    [facility]  [facility] Pager [phone]. If 7PM-7AM, please contact night-coverage at [email or web address], password [facility] 09/02/2014, 3:24 PM

## 2014-09-02 NOTE — Progress Notes (Addendum)
    Underwent lexiscan nuclear stress test. Tolerated procedure well. No acute ST or TW changes. Has some transient bradycardia and one episode of emesis. Now resolved.  Thereasa ParkinKathryn Stern PA-C  [facility]

## 2014-09-02 NOTE — Progress Notes (Signed)
ANTICOAGULATION CONSULT NOTE - Follow Up Consult  Pharmacy Consult for coumadin Indication: h/o VTE  Allergies  Allergen Reactions  . Codeine Nausea And Vomiting    Patient Measurements: Height: 4\' 9"  (144.8 cm) Weight: 142 lb (64.411 kg) IBW/kg (Calculated) : 38.6   Vital Signs: Temp: 97.8 F (36.6 C) (02/20 0207) Temp Source: Oral (02/20 0207) BP: 120/69 mmHg (02/20 0245) Pulse Rate: 68 (02/20 0245)  Labs:  Recent Labs  09/01/14 1853 09/02/14 0142  HGB 14.0  --   HCT 42.1  --   PLT 147*  --   LABPROT  --  19.6*  INR  --  1.64*  CREATININE 0.77  --     Estimated Creatinine Clearance: 52 mL/min (by C-G formula based on Cr of 0.77).   Medications:  Prescriptions prior to admission  Medication Sig Dispense Refill Last Dose  . acetaminophen (TYLENOL) 500 MG tablet Take 500 mg by mouth every 8 (eight) hours as needed for moderate pain.   Past Week at Unknown time  . atorvastatin (LIPITOR) 10 MG tablet Take 10 mg by mouth daily at 6 PM.   08/31/2014 at Unknown time  . Biotin 1000 MCG tablet Take 1,000 mcg by mouth 2 (two) times daily.   09/01/2014 at Unknown time  . buPROPion (WELLBUTRIN SR) 200 MG 12 hr tablet Take 200 mg by mouth 2 (two) times daily.   09/01/2014 at Unknown time  . clonazePAM (KLONOPIN) 1 MG tablet Take 1 mg by mouth 2 (two) times daily.   09/01/2014 at Unknown time  . lamoTRIgine (LAMICTAL) 100 MG tablet Take 100 mg by mouth 2 (two) times daily.   09/01/2014 at am  . levothyroxine (SYNTHROID, LEVOTHROID) 50 MCG tablet Take 50 mcg by mouth daily before breakfast.   09/01/2014 at Unknown time  . warfarin (COUMADIN) 5 MG tablet Take 5 mg by mouth daily at 6 PM.    08/31/2014 at Unknown time    Assessment: 69 yo lady to continue coumadin for h/o VTE.  Her admission INR is subtherapeutic.  Last dose was 2/18. Goal of Therapy:  INR 2-3 Monitor platelets by anticoagulation protocol: Yes   Plan:  Coumadin 6 mg po today Daily PT/INR Monitor for bleeding  complications.  Thanks for allowing pharmacy to be a part of this patient's care.  Talbert CageLora Ivette Castronova, PharmD Clinical Pharmacist, [phone] 09/02/2014,3:20 AM

## 2014-09-02 NOTE — Progress Notes (Signed)
Attempted to get report from ED RN 

## 2014-09-02 NOTE — Consult Note (Signed)
CARDIOLOGY CONSULT NOTE   Patient ID: Kathryn Mathews MRN: [id number] DOB/AGE: Nov 28, 1945 69 y.o.  Admit date: 09/01/2014  Primary Physician   Eustaquio Boyden, MD Primary Cardiologist  New Reason for Consultation   Chest pain and abnormal nuclear stress test.   HPI: Kathryn Mathews is a 69 y.o. female with a history of  HLD, hypothryoidism, previous remote DVT/PE on coumadin and no prior cardiac history who presented to [facility] on 09/01/14 with chest pain. She was admitted by internal medicine service and underwent nuclear stress test this morning. This returned with inferolateral wall inducible ischemia and cardiology was consulted.   She was in her usual state of health until yesterday AM when she woke up with chest discomfort. She thought she may be hungry and ate cereal. The pain became worse and started to radiate around her right jaw and ear, prompting her to call EMS. The pain was constant and lasted for approximately 4.5 hours and then eased off on its own. It has come and gone since; however, when she does have the pain, it can be quite intense and she feels like there is a lump in her throat. No associated SOB, nausea or diaphoresis. She denies a history of exertional CP or SOB but her activity is severely limited by balance issues and chronic back pain from a previous accident. No LE edema, SOB or orthopnea/PND. She has a family history of CAD in her mother who died at the age of 42 from a massive MI. Her maternal GM also had CAD. She did not know her father.   She underwent nuclear stress test this AM which revealed inferior lateral wall inducible ischemia extending into the apex with pharmacologic stress. LVEF ~ 87%. Troponin neg x2.    Past Medical History  Diagnosis Date  . High cholesterol   . Anxiety   . Depression   . Humerus fracture   . Hypothyroidism   . DVT (deep venous thrombosis)   . Pulmonary embolism      Past Surgical History  Procedure Laterality Date    . Cesarean section    . Cholecystectomy      Allergies  Allergen Reactions  . Codeine Nausea And Vomiting    I have reviewed the patient's current medications . atorvastatin  10 mg Oral q1800  . buPROPion  200 mg Oral BID  . clonazePAM  1 mg Oral BID  . lamoTRIgine  100 mg Oral BID  . levothyroxine  50 mcg Oral QAC breakfast  . nitroGLYCERIN  0.5 inch Topical 3 times per day  . pantoprazole  40 mg Oral Daily  . regadenoson      . warfarin  1.5 mg Oral ONCE-1800  . Warfarin - Pharmacist Dosing Inpatient   Does not apply q1800     HYDROcodone-acetaminophen, nitroGLYCERIN, zolpidem  Prior to Admission medications   Medication Sig Start Date End Date Taking? Authorizing Provider  acetaminophen (TYLENOL) 500 MG tablet Take 500 mg by mouth every 8 (eight) hours as needed for moderate pain.   Yes Historical Provider, MD  atorvastatin (LIPITOR) 10 MG tablet Take 10 mg by mouth daily at 6 PM.   Yes Historical Provider, MD  Biotin 1000 MCG tablet Take 1,000 mcg by mouth 2 (two) times daily.   Yes Historical Provider, MD  buPROPion (WELLBUTRIN SR) 200 MG 12 hr tablet Take 200 mg by mouth 2 (two) times daily.   Yes Historical Provider, MD  clonazePAM (KLONOPIN) 1 MG  tablet Take 1 mg by mouth 2 (two) times daily.   Yes Historical Provider, MD  lamoTRIgine (LAMICTAL) 100 MG tablet Take 100 mg by mouth 2 (two) times daily.   Yes Historical Provider, MD  levothyroxine (SYNTHROID, LEVOTHROID) 50 MCG tablet Take 50 mcg by mouth daily before breakfast.   Yes Historical Provider, MD  warfarin (COUMADIN) 5 MG tablet Take 5 mg by mouth daily at 6 PM.    Yes Historical Provider, MD     History   Social History  . Marital Status: Married    Spouse Name: N/A  . Number of Children: N/A  . Years of Education: N/A   Occupational History  . Not on file.   Social History Main Topics  . Smoking status: Never Smoker   . Smokeless tobacco: Not on file  . Alcohol Use: No  . Drug Use: No  . Sexual  Activity: Not on file   Other Topics Concern  . Not on file   Social History Narrative   Born: [place], [place]       Family Status  Relation Status Death Age  . Father Deceased   . Mother Deceased    Family History  Problem Relation Age of Onset  . Heart attack Mother 35  . Cancer Father      ROS:  Full 14 point review of systems complete and found to be negative unless listed above.  Physical Exam: Blood pressure 94/72, pulse 64, temperature 97.5 F (36.4 C), temperature source Oral, resp. rate 12, height 4\' 9"  (1.448 m), weight 142 lb (64.411 kg), SpO2 98 %.  General: Well developed, well nourished, female in no acute distress Head: Eyes PERRLA, No xanthomas.   Normocephalic and atraumatic, oropharynx without edema or exudate. Dentition:  Lungs:  Heart: HRRR S1 S2, no rub/gallop, Heart irregular rate and rhythm with S1, S2  2/6 AS murmur. pulses are 2+ extrem.   Neck: No carotid bruits. No lymphadenopathy.  JVD. Abdomen: Bowel sounds present, abdomen soft and non-tender without masses or hernias noted. Msk:  No spine or cva tenderness. No weakness, no joint deformities or effusions. Extremities: No clubbing or cyanosis.  edema.  Neuro: Alert and oriented X 3. No focal deficits noted. Psych:  Good affect, responds appropriately Skin: No rashes or lesions noted.  Labs:   Lab Results  Component Value Date   WBC 6.4 09/02/2014   HGB 13.3 09/02/2014   HCT 40.7 09/02/2014   MCV 96.4 09/02/2014   PLT 136* 09/02/2014    Recent Labs  09/02/14 0312  INR 1.58*    Recent Labs Lab 09/02/14 0751  NA 135  K 4.1  CL 103  CO2 26  BUN 12  CREATININE 0.75  CALCIUM 9.2  PROT 6.1  BILITOT 0.8  ALKPHOS 57  ALT 13  AST 23  GLUCOSE 97  ALBUMIN 3.5   No results found for: MG  Recent Labs  09/02/14 0312 09/02/14 0751  TROPONINI <0.03 <0.03    Recent Labs  09/01/14 2016 09/01/14 2215  TROPIPOC 0.00 0.00   No results found for: PROBNP Lab Results  Component  Value Date   CHOL 153 09/02/2014   HDL 41 09/02/2014   LDLCALC 84 09/02/2014   TRIG 138 09/02/2014    Echo: pending  ECG:  HR 86. NSR. Prolonged PR  Radiology:  Dg Chest 2 View  09/01/2014   CLINICAL DATA:  Midchest pain radiating into throat.  EXAM: CHEST  2 VIEW  COMPARISON:  None.  FINDINGS: Mild linear right base opacities may be scarring or atelectasis. There is no confluent airspace opacity. Emphysematous changes are present in the upper lobes. Hilar, mediastinal and cardiac contours appear unremarkable. There are no pleural effusions.  IMPRESSION: Mild linear right base opacity due to scarring or atelectasis.   Electronically Signed   By: Ellery Plunk M.D.   On: 09/01/2014 21:11   Nm Myocar Multi W/spect W/wall Motion / Ef  09/02/2014   CLINICAL DATA:  Acute chest pain  EXAM: MYOCARDIAL IMAGING WITH SPECT (REST AND PHARMACOLOGIC-STRESS)  GATED LEFT VENTRICULAR WALL MOTION STUDY  LEFT VENTRICULAR EJECTION FRACTION  TECHNIQUE: Standard myocardial SPECT imaging was performed after resting intravenous injection of 10 mCi Tc-29m sestamibi. Subsequently, intravenous infusion of Lexiscan was performed under the supervision of the Cardiology staff. At peak effect of the drug, 30 mCi Tc-23m sestamibi was injected intravenously and standard myocardial SPECT imaging was performed. Quantitative gated imaging was also performed to evaluate left ventricular wall motion, and estimate left ventricular ejection fraction.  COMPARISON:  None.  FINDINGS: Perfusion: Decreased activity of the inferior lateral wall extending into the apex on the stress views when compared to the rest, compatible with inducible ischemia with pharmacologic stress.  Wall Motion: Normal left ventricular wall motion. No left ventricular dilation.  Left Ventricular Ejection Fraction: 87 %  End diastolic volume 47 ml  End systolic volume 6 ml  IMPRESSION: 1. Positive exam for inferior lateral wall inducible ischemia extending into the  apex with pharmacologic stress.  2. Normal left ventricular wall motion.  3. Left ventricular ejection fraction 87%  4. Intermediate-risk stress test findings*.  *2012 Appropriate Use Criteria for Coronary Revascularization Focused Update: J Am Coll Cardiol. 2012;59(9):857-881. http://content.dementiazones.com.aspx?articleid=[email or web address]   Electronically Signed   By: Judie Petit.  Shick M.D.   On: 09/02/2014 13:36    ASSESSMENT AND PLAN:    Active Problems:   Chest pain   Hypertension   Hyperlipidemia  Kathryn Mathews is a 69 y.o. female with a history of htn, hyperlipidemia and no prior cardiac history who presented to [facility] on 09/01/14 with chest pain. She was admitted by internal medicine service and underwent nuclear stress test this morning. This returned with inferolateral wall inducible ischemia and cardiology was consulted.   Chest pain- concerning for [place] -- Troponin neg x2. ECG with no acute ST or TW changes -- She underwent nuclear stress test this AM which revealed inferior lateral wall inducible ischemia extending into the apex with pharmacologic stress. LVEF ~ 87%.  -- Continue heparin gtt, statin. Will add 81mg  ASA.   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 INR subtheraptuic @ 1.58. Coumadin held because she was placed on heparin gtt for ACS. Will likely need a LHC. Continue heparin gtt.   HLD- continue statin   Signed: Allena Katz 09/02/2014 1:56 PM  Pager [phone] Co-Sign MD  Patient seen and examined with Carlean Jews, PA-C. We discussed all aspects of the encounter. I agree with the assessment and plan as stated above.  Symptoms concerning for [place]. Nuke images reviewed personally and agree with inferolateral ischemia. Need for cath discussed with her and her husband. I reviewed the risks, indications, and alternatives to angioplasty and stenting with the patient. Risks include but are not limited to bleeding, infection, vascular injury, stroke, myocardial  infection, arrhythmia, kidney injury, radiation-related injury in the case of prolonged fluoroscopy use, emergency cardiac surgery, and death. The patient understands the risks of serious complication is low (<1%) and agrees  to proceed. Would hold b-blocker given bradycardia. Treat with ASA and high-dose statin.   She has been on warfarin for over 40 years due to remote DVT/PE. Unclear if she has hypercoaguable state. Will hold for now and start heparin. Will need to decide on resuming anticoagulation after we see what cath shows.   Will get echo for soft AS murmur.   Rosmarie Esquibel,MD 3:27 PM

## 2014-09-02 NOTE — H&P (Addendum)
Kathryn Mathews is an 69 y.o. female.    Ria Bush (pcp)  Chief Complaint: chest pain HPI: 69 yo female with htn, hyperlipidemia c/o chest pain which started this morning when she woke up and ate.  Started in the epigastric area, and then moved up to the neck, and right side of the face.  Denies fever, chills, cough, sob, n/v, abd pain, diarrhea, brbpr, black stool.  Pt came to ED for evaluation.  Pt was not tx with nitroglycerin.  Pt was given morphine without relief.  EKG showed nsr at 85, lad, no st - t segment changes c/w ischemia. CXR negative for any acute process.  Pt will be admitted for chest paiun r/o  Past Medical History  Diagnosis Date  . High cholesterol   . Hypertension   . Anxiety   . Depression   . Humerus fracture   . Hypothyroidism   . DVT (deep venous thrombosis)   . Pulmonary embolism     Past Surgical History  Procedure Laterality Date  . Cesarean section    . Cholecystectomy      Family History  Problem Relation Age of Onset  . Heart attack Mother 83  . Cancer Father    Social History:  reports that she has never smoked. She does not have any smokeless tobacco history on file. She reports that she does not drink alcohol or use illicit drugs.  Allergies:  Allergies  Allergen Reactions  . Codeine Nausea And Vomiting  Medications reviewed   (Not in a hospital admission)  Results for orders placed or performed during the hospital encounter of 09/01/14 (from the past 48 hour(s))  CBC     Status: Abnormal   Collection Time: 09/01/14  6:53 PM  Result Value Ref Range   WBC 7.3 4.0 - 10.5 K/uL   RBC 4.28 3.87 - 5.11 MIL/uL   Hemoglobin 14.0 12.0 - 15.0 g/dL   HCT 42.1 36.0 - 46.0 %   MCV 98.4 78.0 - 100.0 fL   MCH 32.7 26.0 - 34.0 pg   MCHC 33.3 30.0 - 36.0 g/dL   RDW 13.0 11.5 - 15.5 %   Platelets 147 (L) 150 - 400 K/uL  Basic metabolic panel     Status: Abnormal   Collection Time: 09/01/14  6:53 PM  Result Value Ref Range   Sodium 138  135 - 145 mmol/L   Potassium 4.6 3.5 - 5.1 mmol/L   Chloride 104 96 - 112 mmol/L   CO2 25 19 - 32 mmol/L   Glucose, Bld 94 70 - 99 mg/dL   BUN 14 6 - 23 mg/dL   Creatinine, Ser 0.77 0.50 - 1.10 mg/dL   Calcium 9.8 8.4 - 10.5 mg/dL   GFR calc non Af Amer 84 (L) >90 mL/min   GFR calc Af Amer >90 >90 mL/min    Comment: (NOTE) The eGFR has been calculated using the CKD EPI equation. This calculation has not been validated in all clinical situations. eGFR's persistently <90 mL/min signify possible Chronic Kidney Disease.    Anion gap 9 5 - 15  I-stat troponin, ED     Status: None   Collection Time: 09/01/14  8:16 PM  Result Value Ref Range   Troponin i, poc 0.00 0.00 - 0.08 ng/mL   Comment 3            Comment: Due to the release kinetics of cTnI, a negative result within the first hours of the onset of symptoms does  not rule out myocardial infarction with certainty. If myocardial infarction is still suspected, repeat the test at appropriate intervals.   I-stat troponin, ED     Status: None   Collection Time: 09/01/14 10:15 PM  Result Value Ref Range   Troponin i, poc 0.00 0.00 - 0.08 ng/mL   Comment 3            Comment: Due to the release kinetics of cTnI, a negative result within the first hours of the onset of symptoms does not rule out myocardial infarction with certainty. If myocardial infarction is still suspected, repeat the test at appropriate intervals.    Dg Chest 2 View  09/01/2014   CLINICAL DATA:  Midchest pain radiating into throat.  EXAM: CHEST  2 VIEW  COMPARISON:  None.  FINDINGS: Mild linear right base opacities may be scarring or atelectasis. There is no confluent airspace opacity. Emphysematous changes are present in the upper lobes. Hilar, mediastinal and cardiac contours appear unremarkable. There are no pleural effusions.  IMPRESSION: Mild linear right base opacity due to scarring or atelectasis.   Electronically Signed   By: Andreas Newport M.D.   On:  09/01/2014 21:11    Review of Systems  Constitutional: Negative for fever, chills, weight loss, malaise/fatigue and diaphoresis.  HENT: Negative for congestion, ear discharge, ear pain, hearing loss, nosebleeds, sore throat and tinnitus.   Eyes: Negative for blurred vision, double vision, photophobia, pain, discharge and redness.  Respiratory: Negative for cough, hemoptysis, sputum production, shortness of breath, wheezing and stridor.   Cardiovascular: Positive for chest pain. Negative for palpitations, orthopnea, claudication, leg swelling and PND.  Gastrointestinal: Positive for nausea. Negative for heartburn, vomiting, abdominal pain, diarrhea, constipation, blood in stool and melena.  Genitourinary: Negative for dysuria, urgency, frequency, hematuria and flank pain.  Musculoskeletal: Negative for myalgias, back pain, joint pain, falls and neck pain.  Skin: Negative for itching and rash.  Neurological: Negative for dizziness, tingling, tremors, sensory change, speech change, focal weakness, seizures, loss of consciousness, weakness and headaches.  Endo/Heme/Allergies: Negative for environmental allergies and polydipsia. Does not bruise/bleed easily.  Psychiatric/Behavioral: Negative for depression, suicidal ideas, hallucinations, memory loss and substance abuse. The patient is not nervous/anxious and does not have insomnia.     Blood pressure 130/50, pulse 72, temperature 98.3 F (36.8 C), temperature source Oral, resp. rate 21, height 4' 9"  (1.448 m), weight 64.411 kg (142 lb), SpO2 100 %. Physical Exam  Constitutional: She is oriented to person, place, and time. She appears well-developed and well-nourished.  HENT:  Mouth/Throat: No oropharyngeal exudate.  Eyes: Conjunctivae and EOM are normal. Pupils are equal, round, and reactive to light.  Neck: Normal range of motion. Neck supple. No JVD present. No tracheal deviation present. No thyromegaly present.  Cardiovascular: Normal rate  and regular rhythm.  Exam reveals no gallop and no friction rub.   Murmur heard. 2/6 sem apex  Respiratory: Effort normal and breath sounds normal. No respiratory distress. She has no wheezes. She has no rales.  GI: Soft. Bowel sounds are normal. She exhibits no distension. There is no tenderness. There is no rebound and no guarding.  Musculoskeletal: Normal range of motion. She exhibits no edema or tenderness.  Lymphadenopathy:    She has no cervical adenopathy.  Neurological: She is alert and oriented to person, place, and time. She has normal reflexes. She displays normal reflexes. No cranial nerve deficit. She exhibits normal muscle tone. Coordination normal.  Skin: Skin is warm and dry. No  rash noted. No erythema. No pallor.  Psychiatric: She has a normal mood and affect. Her behavior is normal. Judgment and thought content normal.     Assessment/Plan Chest pain Telemetry Trop q6h x3 Check lipid Check pt, ptt Aspirin (was given enroute by EMS), no b blocker due to relative bradycardia, statin Nitropaste protonix 74m poq day NPO  Stress testing in am  Hypertension Was on past medical history, pt denies I have deleted this diagnosis  Hyperlipidemia Cont statin  Thrombocytosis Check cbc  DVT/PE Coumadin pharmacy to dose  Cardiac murmer Check echo  Hypothyroidism Cont levothyroxine  KJani Gravel2/20/2016, 12:53 AM

## 2014-09-02 NOTE — Progress Notes (Addendum)
ANTICOAGULATION CONSULT NOTE - Follow Up Consult  Pharmacy Consult for heparin Indication: h/o VTE  Allergies  Allergen Reactions  . Codeine Nausea And Vomiting    Patient Measurements: Height: 4\' 9"  (144.8 cm) Weight: 142 lb (64.411 kg) IBW/kg (Calculated) : 38.6  Heparin dosing weight: 53 kg  Vital Signs: Temp: 97.4 F (36.3 C) (02/20 0800) Temp Source: Oral (02/20 0800) BP: 142/63 mmHg (02/20 1049) Pulse Rate: 65 (02/20 0800)  Labs:  Recent Labs  09/01/14 1853 09/02/14 0142 09/02/14 0312 09/02/14 0751  HGB 14.0  --   --  13.3  HCT 42.1  --   --  40.7  PLT 147*  --   --  136*  APTT  --   --  47*  --   LABPROT  --  19.6* 19.0*  --   INR  --  1.64* 1.58*  --   CREATININE 0.77  --   --  0.75  TROPONINI  --   --  <0.03 <0.03    Estimated Creatinine Clearance: 52 mL/min (by C-G formula based on Cr of 0.75).   Medications:  Scheduled:  . atorvastatin  10 mg Oral q1800  . buPROPion  200 mg Oral BID  . clonazePAM  1 mg Oral BID  . lamoTRIgine  100 mg Oral BID  . levothyroxine  50 mcg Oral QAC breakfast  . nitroGLYCERIN  0.5 inch Topical 3 times per day  . pantoprazole  40 mg Oral Daily  . regadenoson      . warfarin  1.5 mg Oral ONCE-1800  . Warfarin - Pharmacist Dosing Inpatient   Does not apply q1800    Assessment: 69 yo f admitted overnight on 2/20 for CP. Patient is on warfarin PTA for h/o VTE at 5 mg daily. INR on admission was 1.58.  Patient received 6 mg this AM ~0600.  Will schedule an additional 1.5 mg for tonight at 1800 since likely patient will need more to become therapeutic.  Will follow-up INR in the AM and get back on track for 1800 dosing.  Goal of Therapy:  INR 2-3 Monitor platelets by anticoagulation protocol: Yes   Plan:  Additional warfarin 1.5 mg x 1 tonight INR in AM Monitor plans for cath  Servando Kyllonen L. Roseanne RenoStewart, PharmD Clinical Pharmacy Resident Pager: [phone] 09/02/2014 10:56 AM   Warfarin discontinued.  Starting heparin for  ACS/CP. Will give 3,000 unit bolus and begin a heparin infusion at 650 units/hr (12.2 units/kg/hr).  6-hr HL @ 2100.  Soraiya Ahner L. Roseanne RenoStewart, PharmD Clinical Pharmacy Resident Pager: [phone] 09/02/2014 2:38 PM

## 2014-09-03 DIAGNOSIS — E785 Hyperlipidemia, unspecified: Secondary | ICD-10-CM | POA: Diagnosis present

## 2014-09-03 DIAGNOSIS — Z86711 Personal history of pulmonary embolism: Secondary | ICD-10-CM | POA: Diagnosis not present

## 2014-09-03 DIAGNOSIS — Z79899 Other long term (current) drug therapy: Secondary | ICD-10-CM | POA: Diagnosis not present

## 2014-09-03 DIAGNOSIS — I25110 Atherosclerotic heart disease of native coronary artery with unstable angina pectoris: Secondary | ICD-10-CM | POA: Diagnosis present

## 2014-09-03 DIAGNOSIS — E039 Hypothyroidism, unspecified: Secondary | ICD-10-CM | POA: Diagnosis present

## 2014-09-03 DIAGNOSIS — I10 Essential (primary) hypertension: Secondary | ICD-10-CM | POA: Diagnosis present

## 2014-09-03 DIAGNOSIS — Z8249 Family history of ischemic heart disease and other diseases of the circulatory system: Secondary | ICD-10-CM | POA: Diagnosis not present

## 2014-09-03 DIAGNOSIS — R001 Bradycardia, unspecified: Secondary | ICD-10-CM | POA: Diagnosis present

## 2014-09-03 DIAGNOSIS — E782 Mixed hyperlipidemia: Secondary | ICD-10-CM | POA: Diagnosis present

## 2014-09-03 DIAGNOSIS — Z86718 Personal history of other venous thrombosis and embolism: Secondary | ICD-10-CM | POA: Diagnosis not present

## 2014-09-03 DIAGNOSIS — Z885 Allergy status to narcotic agent status: Secondary | ICD-10-CM | POA: Diagnosis not present

## 2014-09-03 DIAGNOSIS — D649 Anemia, unspecified: Secondary | ICD-10-CM | POA: Diagnosis present

## 2014-09-03 DIAGNOSIS — R931 Abnormal findings on diagnostic imaging of heart and coronary circulation: Secondary | ICD-10-CM

## 2014-09-03 DIAGNOSIS — Z7901 Long term (current) use of anticoagulants: Secondary | ICD-10-CM | POA: Diagnosis not present

## 2014-09-03 DIAGNOSIS — Z7982 Long term (current) use of aspirin: Secondary | ICD-10-CM | POA: Diagnosis not present

## 2014-09-03 DIAGNOSIS — R0602 Shortness of breath: Secondary | ICD-10-CM | POA: Diagnosis present

## 2014-09-03 DIAGNOSIS — E780 Pure hypercholesterolemia: Secondary | ICD-10-CM | POA: Diagnosis present

## 2014-09-03 LAB — CBC
HEMATOCRIT: 39.3 % (ref 36.0–46.0)
HEMOGLOBIN: 12.8 g/dL (ref 12.0–15.0)
MCH: 31.1 pg (ref 26.0–34.0)
MCHC: 32.6 g/dL (ref 30.0–36.0)
MCV: 95.4 fL (ref 78.0–100.0)
Platelets: 149 10*3/uL — ABNORMAL LOW (ref 150–400)
RBC: 4.12 MIL/uL (ref 3.87–5.11)
RDW: 12.9 % (ref 11.5–15.5)
WBC: 6.8 10*3/uL (ref 4.0–10.5)

## 2014-09-03 LAB — HEPARIN LEVEL (UNFRACTIONATED): HEPARIN UNFRACTIONATED: 0.42 [IU]/mL (ref 0.30–0.70)

## 2014-09-03 LAB — PROTIME-INR
INR: 2.03 — AB (ref 0.00–1.49)
Prothrombin Time: 23.2 seconds — ABNORMAL HIGH (ref 11.6–15.2)

## 2014-09-03 MED ORDER — SODIUM CHLORIDE 0.9 % IJ SOLN: 3.0000 mL | INTRAMUSCULAR | Status: DC | PRN

## 2014-09-03 MED ORDER — SODIUM CHLORIDE 0.9 % IV SOLN
INTRAVENOUS | Status: DC
  Administered 2014-09-03: 10 mL via INTRAVENOUS
  Administered 2014-09-03: 02:00:00 via INTRAVENOUS

## 2014-09-03 MED ORDER — ASPIRIN 81 MG PO CHEW
81.0000 mg | CHEWABLE_TABLET | ORAL | Status: AC
  Administered 2014-09-04: 81 mg via ORAL
  Filled 2014-09-03: qty 1

## 2014-09-03 MED ORDER — SODIUM CHLORIDE 0.9 % IV SOLN
1.0000 mL/kg/h | INTRAVENOUS | Status: DC
  Administered 2014-09-04: 1 mL/kg/h via INTRAVENOUS

## 2014-09-03 MED ORDER — SODIUM CHLORIDE 0.9 % IJ SOLN
3.0000 mL | Freq: Two times a day (BID) | INTRAMUSCULAR | Status: DC
  Administered 2014-09-03 – 2014-09-04 (×3): 3 mL via INTRAVENOUS

## 2014-09-03 MED ORDER — SODIUM CHLORIDE 0.9 % IV SOLN: 250.0000 mL | INTRAVENOUS | Status: DC | PRN

## 2014-09-03 MED ORDER — METOPROLOL TARTRATE 25 MG PO TABS
12.5000 mg | ORAL_TABLET | Freq: Two times a day (BID) | ORAL | Status: DC
  Administered 2014-09-03 – 2014-09-04 (×3): 12.5 mg via ORAL
  Filled 2014-09-03 (×6): qty 1

## 2014-09-03 NOTE — Progress Notes (Signed)
ANTICOAGULATION CONSULT NOTE - Follow Up Consult  Pharmacy Consult for heparin Indication: chest pain/ACS  Allergies  Allergen Reactions  . Codeine Nausea And Vomiting    Patient Measurements: Height: 4\' 9"  (144.8 cm) Weight: 142 lb (64.411 kg) IBW/kg (Calculated) : 38.6 Heparin Dosing Weight: 53 kg  Vital Signs: Temp: 98.8 F (37.1 C) (02/21 1224) Temp Source: Oral (02/21 1224) BP: 102/90 mmHg (02/21 1224)  Labs:  Recent Labs  09/01/14 1853 09/02/14 0142 09/02/14 0312 09/02/14 0751 09/02/14 1440 09/02/14 2108 09/03/14 0318  HGB 14.0  --   --  13.3  --   --  12.8  HCT 42.1  --   --  40.7  --   --  39.3  PLT 147*  --   --  136*  --   --  149*  APTT  --   --  47*  --   --   --   --   LABPROT  --  19.6* 19.0*  --   --   --  23.2*  INR  --  1.64* 1.58*  --   --   --  2.03*  HEPARINUNFRC  --   --   --   --   --  0.55 0.42  CREATININE 0.77  --   --  0.75  --   --   --   TROPONINI  --   --  <0.03 <0.03 <0.03  --   --     Estimated Creatinine Clearance: 52 mL/min (by C-G formula based on Cr of 0.75).   Medications:  Scheduled:  . aspirin  81 mg Oral Daily  . atorvastatin  10 mg Oral q1800  . buPROPion  200 mg Oral BID  . clonazePAM  1 mg Oral BID  . lamoTRIgine  100 mg Oral BID  . levothyroxine  50 mcg Oral QAC breakfast  . nitroGLYCERIN  0.5 inch Topical 3 times per day  . pantoprazole  40 mg Oral Daily   Infusions:  . sodium chloride 10 mL/hr at 09/03/14 0800  . heparin 650 Units/hr (09/03/14 0800)    Assessment: 69 yo f admitted on 2/20 for CP. 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  INR this AM 2.03.  Warfarin on hold right now and heparin is started for CP/ACS.  HL therapeutic x 2 (0.55, 0.42) on 650 units/hr. Hgb/plts stable.  Goal of Therapy:  Heparin level 0.3-0.7 units/ml Monitor platelets by anticoagulation protocol: Yes   Plan: Continue heparin infusion 650 units/hr Daily HL and CBC Monitor plans for cath, bleeding  Juleah Paradise  L. Roseanne RenoStewart, PharmD Clinical Pharmacy Resident Pager: [phone] 09/03/2014 12:39 PM

## 2014-09-03 NOTE — Progress Notes (Signed)
Subjective:   C/o indigestion and occasional chest and jaw pain. Now feels ok.     Intake/Output Summary (Last 24 hours) at 09/03/14 1329 Last data filed at 09/03/14 1000  Gross per 24 hour  Intake 1110.5 ml  Output    900 ml  Net  210.5 ml    Current meds: . aspirin  81 mg Oral Daily  . atorvastatin  10 mg Oral q1800  . buPROPion  200 mg Oral BID  . clonazePAM  1 mg Oral BID  . lamoTRIgine  100 mg Oral BID  . levothyroxine  50 mcg Oral QAC breakfast  . nitroGLYCERIN  0.5 inch Topical 3 times per day  . pantoprazole  40 mg Oral Daily   Infusions: . sodium chloride 10 mL/hr at 09/03/14 0800  . heparin 650 Units/hr (09/03/14 0800)     Objective:  Blood pressure 102/90, pulse 74, temperature 98.8 F (37.1 C), temperature source Oral, resp. rate 19, height 4\' 9"  (1.448 m), weight 64.411 kg (142 lb), SpO2 97 %. Weight change:   Physical Exam: General:  Well appearing. No resp difficulty HEENT: normal Neck: supple. JVP . Carotids 2+ bilat; no bruits. No lymphadenopathy or thryomegaly appreciated. Cor: PMI nondisplaced. Regular rate & rhythm. No rubs, gallops or murmurs. Lungs: clear Abdomen: soft, nontender, nondistended. No hepatosplenomegaly. No bruits or masses. Good bowel sounds. Extremities: no cyanosis, clubbing, rash, edema Neuro: alert & orientedx3, cranial nerves grossly intact. moves all 4 extremities w/o difficulty. Affect pleasant  Telemetry: SR 60s  Lab Results: Basic Metabolic Panel:  Recent Labs Lab 09/01/14 1853 09/02/14 0751  NA 138 135  K 4.6 4.1  CL 104 103  CO2 25 26  GLUCOSE 94 97  BUN 14 12  CREATININE 0.77 0.75  CALCIUM 9.8 9.2   Liver Function Tests:  Recent Labs Lab 09/02/14 0751  AST 23  ALT 13  ALKPHOS 57  BILITOT 0.8  PROT 6.1  ALBUMIN 3.5   No results for input(s): LIPASE, AMYLASE in the last 168 hours. No results for input(s): AMMONIA in the last 168 hours. CBC:  Recent Labs Lab 09/01/14 1853 09/02/14 0751  09/03/14 0318  WBC 7.3 6.4 6.8  HGB 14.0 13.3 12.8  HCT 42.1 40.7 39.3  MCV 98.4 96.4 95.4  PLT 147* 136* 149*   Cardiac Enzymes:  Recent Labs Lab 09/02/14 0312 09/02/14 0751 09/02/14 1440  TROPONINI <0.03 <0.03 <0.03   BNP: Invalid input(s): POCBNP CBG: No results for input(s): GLUCAP in the last 168 hours. Microbiology: No results found for: CULT No results for input(s): CULT, SDES in the last 168 hours.  Imaging: Dg Chest 2 View  09/01/2014   CLINICAL DATA:  Midchest pain radiating into throat.  EXAM: CHEST  2 VIEW  COMPARISON:  None.  FINDINGS: Mild linear right base opacities may be scarring or atelectasis. There is no confluent airspace opacity. Emphysematous changes are present in the upper lobes. Hilar, mediastinal and cardiac contours appear unremarkable. There are no pleural effusions.  IMPRESSION: Mild linear right base opacity due to scarring or atelectasis.   Electronically Signed   By: Ellery Plunkaniel R Mitchell M.D.   On: 09/01/2014 21:11   Nm Myocar Multi W/spect W/wall Motion / Ef  09/02/2014   CLINICAL DATA:  Acute chest pain  EXAM: MYOCARDIAL IMAGING WITH SPECT (REST AND PHARMACOLOGIC-STRESS)  GATED LEFT VENTRICULAR WALL MOTION STUDY  LEFT VENTRICULAR EJECTION FRACTION  TECHNIQUE: Standard myocardial SPECT imaging was performed after resting intravenous injection of 10 mCi Tc-5511m  sestamibi. Subsequently, intravenous infusion of Lexiscan was performed under the supervision of the Cardiology staff. At peak effect of the drug, 30 mCi Tc-29m sestamibi was injected intravenously and standard myocardial SPECT imaging was performed. Quantitative gated imaging was also performed to evaluate left ventricular wall motion, and estimate left ventricular ejection fraction.  COMPARISON:  None.  FINDINGS: Perfusion: Decreased activity of the inferior lateral wall extending into the apex on the stress views when compared to the rest, compatible with inducible ischemia with pharmacologic  stress.  Wall Motion: Normal left ventricular wall motion. No left ventricular dilation.  Left Ventricular Ejection Fraction: 87 %  End diastolic volume 47 ml  End systolic volume 6 ml  IMPRESSION: 1. Positive exam for inferior lateral wall inducible ischemia extending into the apex with pharmacologic stress.  2. Normal left ventricular wall motion.  3. Left ventricular ejection fraction 87%  4. Intermediate-risk stress test findings*.  *2012 Appropriate Use Criteria for Coronary Revascularization Focused Update: J Am Coll Cardiol. 2012;59(9):857-881. http://content.dementiazones.com.aspx?articleid=1201161   Electronically Signed   By: Judie Petit.  Shick M.D.   On: 09/02/2014 13:36     ASSESSMENT:  1) [place] 2) Myoview with reversible defect inferolateral  3) Remote h/o DVT/PE  PLAN/DISCUSSION:  Has intermittent stomach and jaw pain. Unclear if angina or not.  INR still 2.0.  For cath when INR <= 1.6. Continue asa, heparin, statin. Start low-dose lopressor as HR tolerates. Continue PPI.   Cath discussed again in detail.     LOS: 0 days    Arvilla Meres, MD 09/03/2014, 1:29 PM

## 2014-09-03 NOTE — Progress Notes (Signed)
Utilization Review Completed.   Nalin Mazzocco, RN, BSN Nurse Case Manager  

## 2014-09-03 NOTE — Progress Notes (Signed)
[facility] PROGRESS NOTE  Kathryn Mathews YNW:[id number] DOB: Aug 01, 1945 DOA: 09/01/2014 PCP: Eustaquio Boyden, MD Interim summary: 69 yo female admitted for chest pain. She underwent stress test , was positive for inducible ischemia. She also reported some persistent chest pressure and discomfort in her jaw. Her coumadin was stopped and she was started on IV heparin and Cardiology consulted and plan for cath when INR is less than 1.6.  Assessment/Plan: Unstable angina: Admitted to 2h. Stress test done  abnormal with decreased activity of the  inferior lateral wall . Stopped coumadin and started IV heparin and called cardiology. Her VSS. Cardiology recommended cardiac cath possibly when INR is less than 2, . She is on nitro patch and her pain is much better. Family at bedside and answered all questions.    Hypertension Controlled.    Hypothyroidism: get tsh.  Resume synthroid.      Code Status: full code.  Family Communication: family at bedside Disposition Plan: pending. Further management.    Consultants:  Cardiology.   Procedures:  Stress test  Echocardiogram.  Antibiotics:  none  HPI/Subjective: Chest pain is improved. No cough or nausea or vomiting.   Objective: Filed Vitals:   09/03/14 1707  BP: 110/58  Pulse:   Temp: 98.5 F (36.9 C)  Resp: 20    Intake/Output Summary (Last 24 hours) at 09/03/14 1835 Last data filed at 09/03/14 1300  Gross per 24 hour  Intake  723.5 ml  Output    650 ml  Net   73.5 ml   Filed Weights   09/01/14 1844  Weight: 64.411 kg (142 lb)    Exam:   General:  Alert afebrile in no distress.   Cardiovascular: s1s2, slight systolic murmer  Respiratory: ctab, no wheezing or rhonchi  Abdomen: soft non tender non distended bowel sounds  Musculoskeletal: no pedal edema.   Data Reviewed: Basic Metabolic Panel:  Recent Labs Lab 09/01/14 1853 09/02/14 0751  NA 138 135  K 4.6 4.1  CL 104 103  CO2 25 26   GLUCOSE 94 97  BUN 14 12  CREATININE 0.77 0.75  CALCIUM 9.8 9.2   Liver Function Tests:  Recent Labs Lab 09/02/14 0751  AST 23  ALT 13  ALKPHOS 57  BILITOT 0.8  PROT 6.1  ALBUMIN 3.5   No results for input(s): LIPASE, AMYLASE in the last 168 hours. No results for input(s): AMMONIA in the last 168 hours. CBC:  Recent Labs Lab 09/01/14 1853 09/02/14 0751 09/03/14 0318  WBC 7.3 6.4 6.8  HGB 14.0 13.3 12.8  HCT 42.1 40.7 39.3  MCV 98.4 96.4 95.4  PLT 147* 136* 149*   Cardiac Enzymes:  Recent Labs Lab 09/02/14 0312 09/02/14 0751 09/02/14 1440  TROPONINI <0.03 <0.03 <0.03   BNP (last 3 results) No results for input(s): BNP in the last 8760 hours.  ProBNP (last 3 results) No results for input(s): PROBNP in the last 8760 hours.  CBG: No results for input(s): GLUCAP in the last 168 hours.  Recent Results (from the past 240 hour(s))  MRSA PCR Screening     Status: None   Collection Time: 09/02/14  2:02 AM  Result Value Ref Range Status   MRSA by PCR NEGATIVE NEGATIVE Final    Comment:        The GeneXpert MRSA Assay (FDA approved for NASAL specimens only), is one component of a comprehensive MRSA colonization surveillance program. It is not intended to diagnose MRSA infection nor to guide or monitor  treatment for MRSA infections.      Studies: Dg Chest 2 View  09/01/2014   CLINICAL DATA:  Midchest pain radiating into throat.  EXAM: CHEST  2 VIEW  COMPARISON:  None.  FINDINGS: Mild linear right base opacities may be scarring or atelectasis. There is no confluent airspace opacity. Emphysematous changes are present in the upper lobes. Hilar, mediastinal and cardiac contours appear unremarkable. There are no pleural effusions.  IMPRESSION: Mild linear right base opacity due to scarring or atelectasis.   Electronically Signed   By: Ellery Plunk M.D.   On: 09/01/2014 21:11   Nm Myocar Multi W/spect W/wall Motion / Ef  09/02/2014   CLINICAL DATA:  Acute  chest pain  EXAM: MYOCARDIAL IMAGING WITH SPECT (REST AND PHARMACOLOGIC-STRESS)  GATED LEFT VENTRICULAR WALL MOTION STUDY  LEFT VENTRICULAR EJECTION FRACTION  TECHNIQUE: Standard myocardial SPECT imaging was performed after resting intravenous injection of 10 mCi Tc-85m sestamibi. Subsequently, intravenous infusion of Lexiscan was performed under the supervision of the Cardiology staff. At peak effect of the drug, 30 mCi Tc-61m sestamibi was injected intravenously and standard myocardial SPECT imaging was performed. Quantitative gated imaging was also performed to evaluate left ventricular wall motion, and estimate left ventricular ejection fraction.  COMPARISON:  None.  FINDINGS: Perfusion: Decreased activity of the inferior lateral wall extending into the apex on the stress views when compared to the rest, compatible with inducible ischemia with pharmacologic stress.  Wall Motion: Normal left ventricular wall motion. No left ventricular dilation.  Left Ventricular Ejection Fraction: 87 %  End diastolic volume 47 ml  End systolic volume 6 ml  IMPRESSION: 1. Positive exam for inferior lateral wall inducible ischemia extending into the apex with pharmacologic stress.  2. Normal left ventricular wall motion.  3. Left ventricular ejection fraction 87%  4. Intermediate-risk stress test findings*.  *2012 Appropriate Use Criteria for Coronary Revascularization Focused Update: J Am Coll Cardiol. 2012;59(9):857-881. http://content.dementiazones.com.aspx?articleid=1201161   Electronically Signed   By: Judie Petit.  Shick M.D.   On: 09/02/2014 13:36    Scheduled Meds: . aspirin  81 mg Oral Daily  . [START ON 09/04/2014] aspirin  81 mg Oral Pre-Cath  . atorvastatin  10 mg Oral q1800  . buPROPion  200 mg Oral BID  . clonazePAM  1 mg Oral BID  . lamoTRIgine  100 mg Oral BID  . levothyroxine  50 mcg Oral QAC breakfast  . metoprolol tartrate  12.5 mg Oral BID WC  . nitroGLYCERIN  0.5 inch Topical 3 times per day  .  pantoprazole  40 mg Oral Daily  . sodium chloride  3 mL Intravenous Q12H   Continuous Infusions: . [START ON 09/04/2014] sodium chloride    . sodium chloride 10 mL/hr at 09/03/14 0800  . heparin 650 Units/hr (09/03/14 0800)    Active Problems:   Chest pain   Hypertension   Hyperlipidemia    Time spent: 25 min    [facility]  [facility] Pager [phone]. If 7PM-7AM, please contact night-coverage at [email or web address], password [facility] 09/03/2014, 6:35 PM  LOS: 0 days

## 2014-09-04 ENCOUNTER — Telehealth: Payer: Self-pay

## 2014-09-04 DIAGNOSIS — E785 Hyperlipidemia, unspecified: Secondary | ICD-10-CM

## 2014-09-04 DIAGNOSIS — I200 Unstable angina: Secondary | ICD-10-CM | POA: Diagnosis present

## 2014-09-04 DIAGNOSIS — R9439 Abnormal result of other cardiovascular function study: Secondary | ICD-10-CM | POA: Diagnosis not present

## 2014-09-04 LAB — CBC
HCT: 35.5 % — ABNORMAL LOW (ref 36.0–46.0)
HEMOGLOBIN: 11.7 g/dL — AB (ref 12.0–15.0)
MCH: 31.5 pg (ref 26.0–34.0)
MCHC: 33 g/dL (ref 30.0–36.0)
MCV: 95.7 fL (ref 78.0–100.0)
PLATELETS: 145 10*3/uL — AB (ref 150–400)
RBC: 3.71 MIL/uL — AB (ref 3.87–5.11)
RDW: 13.1 % (ref 11.5–15.5)
WBC: 5.5 10*3/uL (ref 4.0–10.5)

## 2014-09-04 LAB — BASIC METABOLIC PANEL
ANION GAP: 4 — AB (ref 5–15)
BUN: 13 mg/dL (ref 6–23)
CO2: 24 mmol/L (ref 19–32)
Calcium: 8.4 mg/dL (ref 8.4–10.5)
Chloride: 110 mmol/L (ref 96–112)
Creatinine, Ser: 0.72 mg/dL (ref 0.50–1.10)
Creatinine: 0.7 mg/dL (ref <=1.1)
GFR, EST NON AFRICAN AMERICAN: 86 mL/min — AB (ref >90)
Glucose, Bld: 91 mg/dL (ref 70–99)
Glucose: 91 mg/dL
Potassium: 3.7 mmol/L (ref 3.4–5.3)
Potassium: 3.7 mmol/L (ref 3.5–5.1)
Sodium: 138 mmol/L (ref 135–145)
Sodium: 138 mmol/L (ref 137–147)

## 2014-09-04 LAB — HEPARIN LEVEL (UNFRACTIONATED)
HEPARIN UNFRACTIONATED: 0.99 [IU]/mL — AB (ref 0.30–0.70)
Heparin Unfractionated: 0.81 IU/mL — ABNORMAL HIGH (ref 0.30–0.70)
Heparin Unfractionated: 0.61 IU/mL (ref 0.30–0.70)

## 2014-09-04 LAB — PROTIME-INR
INR: 1.85 — ABNORMAL HIGH (ref 0.00–1.49)
Prothrombin Time: 21.5 seconds — ABNORMAL HIGH (ref 11.6–15.2)

## 2014-09-04 LAB — TSH: TSH: 1.476 u[IU]/mL (ref 0.350–4.500)

## 2014-09-04 MED ORDER — SODIUM CHLORIDE 0.9 % IJ SOLN
3.0000 mL | Freq: Two times a day (BID) | INTRAMUSCULAR | Status: DC
Start: 2014-09-04 — End: 2014-09-05

## 2014-09-04 MED ORDER — ATORVASTATIN CALCIUM 80 MG PO TABS
80.0000 mg | ORAL_TABLET | Freq: Every day | ORAL | Status: DC
  Administered 2014-09-04: 80 mg via ORAL
  Filled 2014-09-04 (×2): qty 1

## 2014-09-04 MED ORDER — SODIUM CHLORIDE 0.9 % IV SOLN
1.0000 mL/kg/h | INTRAVENOUS | Status: DC
  Administered 2014-09-05: 1 mL/kg/h via INTRAVENOUS

## 2014-09-04 MED ORDER — SODIUM CHLORIDE 0.9 % IV SOLN
250.0000 mL | INTRAVENOUS | Status: DC | PRN
Start: 2014-09-04 — End: 2014-09-05

## 2014-09-04 MED ORDER — SODIUM CHLORIDE 0.9 % IJ SOLN
3.0000 mL | INTRAMUSCULAR | Status: DC | PRN
Start: 2014-09-04 — End: 2014-09-05

## 2014-09-04 MED FILL — Perflutren Lipid Microsphere IV Susp 1.1 MG/ML: INTRAVENOUS | Qty: 10 | Status: AC

## 2014-09-04 NOTE — Progress Notes (Signed)
ANTICOAGULATION CONSULT NOTE - Follow Up Consult  Pharmacy Consult for heparin Indication: chest pain/ACS  Allergies  Allergen Reactions  . Codeine Nausea And Vomiting    Patient Measurements: Height: 4\' 9"  (144.8 cm) Weight: 142 lb (64.411 kg) IBW/kg (Calculated) : 38.6 Heparin Dosing Weight: 53 kg  Vital Signs: Temp: 98.1 F (36.7 C) (02/22 1900) Temp Source: Oral (02/22 1900) BP: 91/69 mmHg (02/22 1900) Pulse Rate: 54 (02/22 1900)  Labs:  Recent Labs  09/02/14 0312  09/02/14 0751 09/02/14 1440  09/03/14 0318 09/04/14 0333 09/04/14 1205 09/04/14 1853  HGB  --   < > 13.3  --   --  12.8 11.7*  --   --   HCT  --   --  40.7  --   --  39.3 35.5*  --   --   PLT  --   --  136*  --   --  149* 145*  --   --   APTT 47*  --   --   --   --   --   --   --   --   LABPROT 19.0*  --   --   --   --  23.2* 21.5*  --   --   INR 1.58*  --   --   --   --  2.03* 1.85*  --   --   HEPARINUNFRC  --   --   --   --   < > 0.42 0.99* 0.81* 0.61  CREATININE  --   --  0.75  --   --   --  0.72  --   --   TROPONINI <0.03  --  <0.03 <0.03  --   --   --   --   --   < > = values in this interval not displayed.  Estimated Creatinine Clearance: 52 mL/min (by C-G formula based on Cr of 0.72).   Medications:  Scheduled:  . aspirin  81 mg Oral Daily  . atorvastatin  80 mg Oral q1800  . buPROPion  200 mg Oral BID  . clonazePAM  1 mg Oral BID  . lamoTRIgine  100 mg Oral BID  . levothyroxine  50 mcg Oral QAC breakfast  . metoprolol tartrate  12.5 mg Oral BID WC  . nitroGLYCERIN  0.5 inch Topical 3 times per day  . pantoprazole  40 mg Oral Daily  . sodium chloride  3 mL Intravenous Q12H  . sodium chloride  3 mL Intravenous Q12H   Infusions:  . sodium chloride 10 mL (09/03/14 2003)  . [START ON 09/05/2014] sodium chloride    . heparin 450 Units/hr (09/04/14 1500)    Assessment: 69 yo f admitted on 2/20 for CP. 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  INR this AM 1.85.  Warfarin  on hold right now and heparin was started for CP/ACS.   No bleeding noted.  Heparin drip 450 uts/hr HL 0.61  - at goal.    Goal of Therapy:  Heparin level 0.3-0.7 units/ml Monitor platelets by anticoagulation protocol: Yes   Plan: Continue heparin infusion 450 units/hr Monitor daily HL, CBC, and s/s of bleeding Cath planned for tomorrow AM   Leota SauersLisa Allena Pietila Pharm.D. CPP, BCPS Clinical Pharmacist [phone] 09/04/2014 8:45 PM

## 2014-09-04 NOTE — Telephone Encounter (Signed)
PLEASE NOTE: All timestamps contained within this report are represented as [place] Time. CONFIDENTIALTY NOTICE: This fax transmission is intended only for the addressee. It contains information that is legally privileged, confidential or otherwise protected from use or disclosure. If you are not the intended recipient, you are strictly prohibited from reviewing, disclosing, copying using or disseminating any of this information or taking any action in reliance on or regarding this information. If you have received this fax in error, please notify us immediately by telephone so that we can arrange for its return to us. Phone: [phone], Toll-Free: [phone], Fax: [phone] Page: 1 of 2 Call Id: [id number] [facility] Client TELEPHONE ADVICE RECORD [facility] Patient Name: Kathryn Mathews Gender: Female DOB: 02/19/1946 Age: [age] Y 10 M 7 D Return Phone Number: [phone] (Primary), [phone] (Secondary) Address: City/State/Zip: [place] Client [facility] Physician Eustaquio BoydenGutierrez, Javier Contact Type Call Call Type Triage / Clinical Relationship To Patient Self Appointment Disposition EMR Appointment Not Necessary Return Phone Number [phone] (Primary) Chief Complaint Indigestion/Acid Reflux Initial Comment Caller states she is having indigestion and pain widespread. PreDisposition Did not know what to do Info pasted into Epic No Nurse Assessment Nurse: Dannielle HuhBlevins, RN, Ivin BootyJoshua Date/Time (Eastern Time): 09/01/2014 5:05:58 PM Confirm and document reason for call. If symptomatic, describe symptoms. ---Caller states she is having pain in chest, throat, and right side of the face. Has the patient traveled out of the country within the last 30 days? ---Not Applicable Does the patient require triage? ---Yes Related visit to physician within the  last 2 weeks? ---No Does the PT have any chronic conditions? (i.e. diabetes, asthma, etc.) ---Yes List chronic conditions. ---Anxiety History of DVT in the left leg Guidelines Guideline Title Affirmed Question Affirmed Notes Nurse Date/Time Lamount Cohen(Eastern Time) Chest Pain [1] Chest pain lasts > 5 minutes AND [2] age > 650 Dannielle HuhBlevins, RN, Ivin BootyJoshua 09/01/2014 5:11:36 PM Disp. Time Lamount Cohen(Eastern Time) Disposition Final User 09/01/2014 5:21:36 PM Send To RN Personal Dannielle HuhBlevins, RN, Ivin BootyJoshua 09/01/2014 5:28:10 PM Attempt made - message left Dannielle HuhBlevins, RN, Ivin BootyJoshua 09/01/2014 5:33:18 PM Call Completed Dannielle HuhBlevins, RN, Ivin BootyJoshua 09/01/2014 5:19:51 PM Call EMS 911 Now Yes Blevins, RN, Ivin BootyJoshua PLEASE NOTE: All timestamps contained within this report are represented as [place] Standard Time. CONFIDENTIALTY NOTICE: This fax transmission is intended only for the addressee. It contains information that is legally privileged, confidential or otherwise protected from use or disclosure. If you are not the intended recipient, you are strictly prohibited from reviewing, disclosing, copying using or disseminating any of this information or taking any action in reliance on or regarding this information. If you have received this fax in error, please notify us immediately by telephone so that we can arrange for its return to us. Phone: [phone], Toll-Free: [phone], Fax: [phone] Page: 2 of 2 Call Id: [id number] Caller Understands: Yes Disagree/Comply: Comply Care Advice Given Per Guideline CALL EMS 911 NOW: Immediate medical attention is needed. You need to hang up and call 911 (or an ambulance). Probation officer([facility] Discretion: I'll call you back in a few minutes to be sure you were able to reach them.) IF CALLER ASKS ABOUT ASPIRIN: * Call EMS 911 first. CARE ADVICE given per Chest Pain (Adult) guideline. After Care Instructions Given Call Event Type User Date / Time Description Comments User: Elly ModenaJoshua, Blevins, RN Date/Time Lamount Cohen(Eastern Time):  09/01/2014 5:33:03 PM Caller was able to reach 911, states  that they are on there way.

## 2014-09-04 NOTE — Progress Notes (Signed)
[facility] PROGRESS NOTE  Kathryn Mathews NUU:[id number] DOB: April 08, 1946 DOA: 09/01/2014 PCP: Eustaquio Boyden, MD Interim summary: 69 yo female admitted for chest pain. She underwent stress test , was positive for inducible ischemia. She also reported some persistent chest pressure and discomfort in her jaw on the day of the stress test, nitro patch ordered. Her coumadin was stopped and she was started on IV heparin and Cardiology consulted and plan for cath when INR is less than 1.6.  Assessment/Plan: Unstable angina: Admitted to 2h. Stress test done  abnormal with decreased activity of the  inferior lateral wall . Stopped coumadin and started IV heparin and called cardiology. Her VSS. Cardiology recommended cardiac cath possibly when INR is less than 2, . She is on nitro patch and her pain is much better. Family at bedside and answered all questions.    Hypertension Controlled.    Hypothyroidism: get tsh.  Resume synthroid.   Mild anemia: Normocytic, continue to monitor.      Code Status: full code.  Family Communication: family at bedside Disposition Plan: pending. Further management.    Consultants:  Cardiology.   Procedures:  Stress test  Echocardiogram.  Antibiotics:  none  HPI/Subjective: Chest pain is improved. No cough or nausea or vomiting. She is cheerful.   Objective: Filed Vitals:   09/04/14 1111  BP: 119/59  Pulse:   Temp: 97.8 F (36.6 C)  Resp:     Intake/Output Summary (Last 24 hours) at 09/04/14 1601 Last data filed at 09/04/14 0700  Gross per 24 hour  Intake 505.62 ml  Output      0 ml  Net 505.62 ml   Filed Weights   09/01/14 1844  Weight: 64.411 kg (142 lb)    Exam:   General:  Alert afebrile in no distress.   Cardiovascular: s1s2, slight systolic murmer  Respiratory: ctab, no wheezing or rhonchi  Abdomen: soft non tender non distended bowel sounds  Musculoskeletal: no pedal edema.   Data Reviewed: Basic  Metabolic Panel:  Recent Labs Lab 09/01/14 1853 09/02/14 0751 09/04/14 0333  NA 138 135 138  K 4.6 4.1 3.7  CL 104 103 110  CO2 25 26 24   GLUCOSE 94 97 91  BUN 14 12 13   CREATININE 0.77 0.75 0.72  CALCIUM 9.8 9.2 8.4   Liver Function Tests:  Recent Labs Lab 09/02/14 0751  AST 23  ALT 13  ALKPHOS 57  BILITOT 0.8  PROT 6.1  ALBUMIN 3.5   No results for input(s): LIPASE, AMYLASE in the last 168 hours. No results for input(s): AMMONIA in the last 168 hours. CBC:  Recent Labs Lab 09/01/14 1853 09/02/14 0751 09/03/14 0318 09/04/14 0333  WBC 7.3 6.4 6.8 5.5  HGB 14.0 13.3 12.8 11.7*  HCT 42.1 40.7 39.3 35.5*  MCV 98.4 96.4 95.4 95.7  PLT 147* 136* 149* 145*   Cardiac Enzymes:  Recent Labs Lab 09/02/14 0312 09/02/14 0751 09/02/14 1440  TROPONINI <0.03 <0.03 <0.03   BNP (last 3 results) No results for input(s): BNP in the last 8760 hours.  ProBNP (last 3 results) No results for input(s): PROBNP in the last 8760 hours.  CBG: No results for input(s): GLUCAP in the last 168 hours.  Recent Results (from the past 240 hour(s))  MRSA PCR Screening     Status: None   Collection Time: 09/02/14  2:02 AM  Result Value Ref Range Status   MRSA by PCR NEGATIVE NEGATIVE Final    Comment:  The GeneXpert MRSA Assay (FDA approved for NASAL specimens only), is one component of a comprehensive MRSA colonization surveillance program. It is not intended to diagnose MRSA infection nor to guide or monitor treatment for MRSA infections.      Studies: No results found.  Scheduled Meds: . aspirin  81 mg Oral Daily  . atorvastatin  80 mg Oral q1800  . buPROPion  200 mg Oral BID  . clonazePAM  1 mg Oral BID  . lamoTRIgine  100 mg Oral BID  . levothyroxine  50 mcg Oral QAC breakfast  . metoprolol tartrate  12.5 mg Oral BID WC  . nitroGLYCERIN  0.5 inch Topical 3 times per day  . pantoprazole  40 mg Oral Daily  . sodium chloride  3 mL Intravenous Q12H    Continuous Infusions: . sodium chloride 10 mL (09/03/14 2003)  . heparin 550 Units/hr (09/04/14 0507)    Active Problems:   Chest pain   Hypertension   Hyperlipidemia   Pain in the chest    Time spent: 25 min    [facility]  [facility] Pager [phone]. If 7PM-7AM, please contact night-coverage at [email or web address], password [facility] 09/04/2014, 4:01 PM  LOS: 1 day

## 2014-09-04 NOTE — Telephone Encounter (Signed)
hospitalized

## 2014-09-04 NOTE — Progress Notes (Signed)
ANTICOAGULATION CONSULT NOTE - Follow Up Consult  Pharmacy Consult for heparin Indication: chest pain/ACS  Allergies  Allergen Reactions  . Codeine Nausea And Vomiting    Patient Measurements: Height: 4\' 9"  (144.8 cm) Weight: 142 lb (64.411 kg) IBW/kg (Calculated) : 38.6 Heparin Dosing Weight: 53 kg  Vital Signs: Temp: 97.8 F (36.6 C) (02/22 1111) Temp Source: Oral (02/22 1111) BP: 119/59 mmHg (02/22 1111)  Labs:  Recent Labs  09/01/14 1853  09/02/14 0312 09/02/14 0751 09/02/14 1440  09/03/14 0318 09/04/14 0333 09/04/14 1205  HGB 14.0  --   --  13.3  --   --  12.8 11.7*  --   HCT 42.1  --   --  40.7  --   --  39.3 35.5*  --   PLT 147*  --   --  136*  --   --  149* 145*  --   APTT  --   --  47*  --   --   --   --   --   --   LABPROT  --   < > 19.0*  --   --   --  23.2* 21.5*  --   INR  --   < > 1.58*  --   --   --  2.03* 1.85*  --   HEPARINUNFRC  --   --   --   --   --   < > 0.42 0.99* 0.81*  CREATININE 0.77  --   --  0.75  --   --   --  0.72  --   TROPONINI  --   --  <0.03 <0.03 <0.03  --   --   --   --   < > = values in this interval not displayed.  Estimated Creatinine Clearance: 52 mL/min (by C-G formula based on Cr of 0.72).   Medications:  Scheduled:  . aspirin  81 mg Oral Daily  . atorvastatin  80 mg Oral q1800  . buPROPion  200 mg Oral BID  . clonazePAM  1 mg Oral BID  . lamoTRIgine  100 mg Oral BID  . levothyroxine  50 mcg Oral QAC breakfast  . metoprolol tartrate  12.5 mg Oral BID WC  . nitroGLYCERIN  0.5 inch Topical 3 times per day  . pantoprazole  40 mg Oral Daily  . sodium chloride  3 mL Intravenous Q12H   Infusions:  . sodium chloride 10 mL (09/03/14 2003)  . heparin 550 Units/hr (09/04/14 0507)    Assessment: 69 yo f admitted on 2/20 for CP. 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  INR this AM 1.85.  Warfarin on hold right now and heparin was started for CP/ACS.  HL remains supratherapeutic on 550 units/hr.  No bleeding  noted  Goal of Therapy:  Heparin level 0.3-0.7 units/ml Monitor platelets by anticoagulation protocol: Yes   Plan: Decrease heparin infusion 450 units/hr Check 6 hr HL Monitor daily HL, CBC, and s/s of bleeding Cath planned for tomorrow AM   Vinnie LevelBenjamin Tykeria Wawrzyniak, PharmD., BCPS Clinical Pharmacist Pager [phone]

## 2014-09-04 NOTE — Progress Notes (Signed)
TELEMETRY: Reviewed telemetry pt in NSR: Filed Vitals:   09/04/14 0006 09/04/14 0300 09/04/14 0312 09/04/14 0723  BP:   90/49 110/49  Pulse:      Temp:    97.8 F (36.6 C)  TempSrc:   Oral Oral  Resp: 16 20  18   Height:      Weight:      SpO2:  96%  99%    Intake/Output Summary (Last 24 hours) at 09/04/14 0753 Last data filed at 09/04/14 0600  Gross per 24 hour  Intake 1398.62 ml  Output    450 ml  Net 948.62 ml   Filed Weights   09/01/14 1844  Weight: 142 lb (64.411 kg)    Subjective Feels tired. No recurrent chest pain. No SOB.  Marland Kitchen aspirin  81 mg Oral Daily  . atorvastatin  80 mg Oral q1800  . buPROPion  200 mg Oral BID  . clonazePAM  1 mg Oral BID  . lamoTRIgine  100 mg Oral BID  . levothyroxine  50 mcg Oral QAC breakfast  . metoprolol tartrate  12.5 mg Oral BID WC  . nitroGLYCERIN  0.5 inch Topical 3 times per day  . pantoprazole  40 mg Oral Daily  . sodium chloride  3 mL Intravenous Q12H   . sodium chloride 10 mL (09/03/14 2003)  . heparin 550 Units/hr (09/04/14 0507)    LABS: Basic Metabolic Panel:  Recent Labs  04/54/09 0751 09/04/14 0333  NA 135 138  K 4.1 3.7  CL 103 110  CO2 26 24  GLUCOSE 97 91  BUN 12 13  CREATININE 0.75 0.72  CALCIUM 9.2 8.4   Liver Function Tests:  Recent Labs  09/02/14 0751  AST 23  ALT 13  ALKPHOS 57  BILITOT 0.8  PROT 6.1  ALBUMIN 3.5   No results for input(s): LIPASE, AMYLASE in the last 72 hours. CBC:  Recent Labs  09/03/14 0318 09/04/14 0333  WBC 6.8 5.5  HGB 12.8 11.7*  HCT 39.3 35.5*  MCV 95.4 95.7  PLT 149* 145*   Cardiac Enzymes:  Recent Labs  09/02/14 0312 09/02/14 0751 09/02/14 1440  TROPONINI <0.03 <0.03 <0.03   BNP: No results for input(s): PROBNP in the last 72 hours. D-Dimer: No results for input(s): DDIMER in the last 72 hours. Hemoglobin A1C: No results for input(s): HGBA1C in the last 72 hours. Fasting Lipid Panel:  Recent Labs  09/02/14 0751  CHOL 153  HDL 41   LDLCALC 84  TRIG 811  CHOLHDL 3.7   Thyroid Function Tests:  Recent Labs  09/04/14 0333  TSH 1.476     Radiology/Studies:  Nm Myocar Multi W/spect W/wall Motion / Ef  09/02/2014   CLINICAL DATA:  Acute chest pain  EXAM: MYOCARDIAL IMAGING WITH SPECT (REST AND PHARMACOLOGIC-STRESS)  GATED LEFT VENTRICULAR WALL MOTION STUDY  LEFT VENTRICULAR EJECTION FRACTION  TECHNIQUE: Standard myocardial SPECT imaging was performed after resting intravenous injection of 10 mCi Tc-59m sestamibi. Subsequently, intravenous infusion of Lexiscan was performed under the supervision of the Cardiology staff. At peak effect of the drug, 30 mCi Tc-62m sestamibi was injected intravenously and standard myocardial SPECT imaging was performed. Quantitative gated imaging was also performed to evaluate left ventricular wall motion, and estimate left ventricular ejection fraction.  COMPARISON:  None.  FINDINGS: Perfusion: Decreased activity of the inferior lateral wall extending into the apex on the stress views when compared to the rest, compatible with inducible ischemia with pharmacologic stress.  Wall Motion: Normal  left ventricular wall motion. No left ventricular dilation.  Left Ventricular Ejection Fraction: 87 %  End diastolic volume 47 ml  End systolic volume 6 ml  IMPRESSION: 1. Positive exam for inferior lateral wall inducible ischemia extending into the apex with pharmacologic stress.  2. Normal left ventricular wall motion.  3. Left ventricular ejection fraction 87%  4. Intermediate-risk stress test findings*.  *2012 Appropriate Use Criteria for Coronary Revascularization Focused Update: J Am Coll Cardiol. 2012;59(9):857-881. http://content.dementiazones.com.aspx?articleid=1201161   Electronically Signed   By: Judie Petit.  Shick M.D.   On: 09/02/2014 13:36   Ecg: NSR with LVH. No acute change.  Transthoracic Echocardiography  Patient:  Kathryn Mathews, Kathryn Mathews MR #:    [id number] Study Date: 09/02/2014 Gender:    F Age:    69 Height:   144.8 cm Weight:   64.4 kg BSA:    1.64 m^2 Pt. Status: Room:    2H24C  ADMITTING  [facility] [address] [id number] Orma Flaming [id number] Randon Goldsmith, Edson Snowball [id number] PERFORMING  [facility], Inpatient SONOGRAPHER Sheralyn Boatman  cc:  ------------------------------------------------------------------- LV EF: 60% -  65%  ------------------------------------------------------------------- Indications:   Murmur 785.2.  ------------------------------------------------------------------- History:  PMH:  Angina pectoris. Risk factors: Hypertension. Dyslipidemia.  ------------------------------------------------------------------- Study Conclusions  - Left ventricle: The cavity size was normal. Systolic function was normal. The estimated ejection fraction was in the range of 60% to 65%. Wall motion was normal; there were no regional wall motion abnormalities. There was an increased relative contribution of atrial contraction to ventricular filling. Doppler parameters are consistent with abnormal left ventricular relaxation (grade 1 diastolic dysfunction). - Aortic valve: Mildly calcified annulus. Trileaflet; normal thickness leaflets. Valve area (VTI): 1.49 cm^2. Valve area (Vmax): 1.36 cm^2. Valve area (Vmean): 1.39 cm^2. - Pulmonic valve: There was no regurgitation.  Transthoracic echocardiography. M-mode, complete 2D, spectral Doppler, and color Doppler. Birthdate: Patient birthdate: 1946-02-26. Age: Patient is 69 yr old. Sex: Gender: female. BMI: 30.7 kg/m^2. Blood pressure:   94/72 Patient status: Inpatient. Study date: Study date: 09/02/2014. Study time: 03:19 PM. Location: ICU/CCU  -------------------------------------------------------------------  ------------------------------------------------------------------- Left ventricle: The cavity size was normal.  Systolic function was normal. The estimated ejection fraction was in the range of 60% to 65%. Wall motion was normal; there were no regional wall motion abnormalities. There was an increased relative contribution of atrial contraction to ventricular filling. Doppler parameters are consistent with abnormal left ventricular relaxation (grade 1 diastolic dysfunction).  ------------------------------------------------------------------- Aortic valve:  Mildly calcified annulus. Trileaflet; normal thickness leaflets. Mobility was not restricted. Doppler: Transvalvular velocity was within the normal range. There was no stenosis. There was no regurgitation.  VTI ratio of LVOT to aortic valve: 0.59. Valve area (VTI): 1.49 cm^2. Indexed valve area (VTI): 0.91 cm^2/m^2. Valve area (Vmax): 1.36 cm^2. Indexed valve area (Vmax): 0.83 cm^2/m^2. Mean velocity ratio of LVOT to aortic valve: 0.55. Valve area (Vmean): 1.39 cm^2. Indexed valve area (Vmean): 0.85 cm^2/m^2.  Mean gradient (S): 6 mm Hg. Peak gradient (S): 15 mm Hg.  ------------------------------------------------------------------- Aorta: Aortic root: The aortic root was normal in size.  ------------------------------------------------------------------- Mitral valve:  Structurally normal valve.  Mobility was not restricted. Doppler: Transvalvular velocity was within the normal range. There was no evidence for stenosis. There was no regurgitation.  Peak gradient (D): 3 mm Hg.  ------------------------------------------------------------------- Left atrium: The atrium was normal in size.  ------------------------------------------------------------------- Right ventricle: The cavity size was normal. Wall thickness was normal. Systolic function was normal.  ------------------------------------------------------------------- Pulmonic valve:  Structurally normal valve.  Cusp separation was normal. Doppler:  Transvalvular velocity was within the normal range. There was no evidence for stenosis. There was no regurgitation.  ------------------------------------------------------------------- Tricuspid valve:  Structurally normal valve.  Doppler: Transvalvular velocity was within the normal range. There was no regurgitation.  ------------------------------------------------------------------- Pulmonary artery:  The main pulmonary artery was normal-sized. Systolic pressure was within the normal range.  ------------------------------------------------------------------- Right atrium: The atrium was normal in size.  ------------------------------------------------------------------- Pericardium: There was no pericardial effusion.  ------------------------------------------------------------------- Systemic veins: Inferior vena cava: The vessel was normal in size.  ------------------------------------------------------------------- Measurements  Left ventricle              Value     Reference LV ID, ED, PLAX chordal      (L)   30.2 mm    43 - 52 LV ID, ES, PLAX chordal      (L)   16.7 mm    23 - 38 LV fx shortening, PLAX chordal      45  %    >=29 LV PW thickness, ED            9.66 mm    --------- IVS/LV PW ratio, ED            0.88      <=1.3 Stroke volume, 2D             49  ml    --------- Stroke volume/bsa, 2D           30  ml/m^2  --------- LV e&', lateral              8.58 cm/s   --------- LV E/e&', lateral             9.56      --------- LV e&', medial               7.4  cm/s   --------- LV E/e&', medial              11.08     --------- LV e&', average              7.99 cm/s   --------- LV E/e&', average             10.26      ---------  Ventricular septum            Value     Reference IVS thickness, ED             8.49 mm    ---------  LVOT                   Value     Reference LVOT ID, S                18  mm    --------- LVOT area                 2.54 cm^2   --------- LVOT peak velocity, S           104  cm/s   --------- LVOT mean velocity, S           60  cm/s   --------- LVOT VTI, S                19.4 cm    --------- LVOT peak gradient, S           4   mm Hg  ---------  Aortic valve  Value     Reference Aortic valve mean velocity, S       110  cm/s   --------- Aortic valve VTI, S            33  cm    --------- Aortic mean gradient, S          6   mm Hg  --------- Aortic peak gradient, S          15  mm Hg  --------- VTI ratio, LVOT/AV            0.59      --------- Aortic valve area, VTI          1.49 cm^2   --------- Aortic valve area/bsa, VTI        0.91 cm^2/m^2 --------- Aortic valve area, peak velocity     1.36 cm^2   --------- Aortic valve area/bsa, peak        0.83 cm^2/m^2 --------- velocity Velocity ratio, mean, LVOT/AV       0.55      --------- Aortic valve area, mean velocity     1.39 cm^2   --------- Aortic valve area/bsa, mean        0.85 cm^2/m^2 --------- velocity  Aorta                   Value     Reference Aortic root ID, ED            29  mm    ---------  Left atrium                Value     Reference LA ID, A-P, ES              29  mm    --------- LA ID/bsa, A-P              1.77 cm/m^2  <=2.2 LA volume, S                28.1 ml    --------- LA volume/bsa, S             17.2 ml/m^2  --------- LA volume, ES, 1-p A4C          30.6 ml    --------- LA volume/bsa, ES, 1-p A4C        18.7 ml/m^2  --------- LA volume, ES, 1-p A2C          23.8 ml    --------- LA volume/bsa, ES, 1-p A2C        14.5 ml/m^2  ---------  Mitral valve               Value     Reference Mitral E-wave peak velocity        82  cm/s   --------- Mitral A-wave peak velocity        113  cm/s   --------- Mitral deceleration time     (H)   356  ms    150 - 230 Mitral peak gradient, D          3   mm Hg  --------- Mitral E/A ratio, peak          0.7      ---------  Systemic veins              Value     Reference Estimated CVP               3  mm Hg  ---------  Right ventricle              Value     Reference RV s&', lateral, S             12.9 cm/s   ---------  Legend: (L) and (H) mark values outside specified reference range.  ------------------------------------------------------------------- Prepared and Electronically Authenticated by  Armanda Magic, MD 2016-02-20T16:56:35  PHYSICAL EXAM General: Well developed, well nourished, in no acute distress. Head: Normocephalic, atraumatic, sclera non-icteric, oropharynx is clear Neck: Negative for carotid bruits. JVD not elevated. No adenopathy Lungs: Clear bilaterally to auscultation without wheezes, rales, or rhonchi. Breathing is unlabored. Heart: RRR S1 S2 with soft systolic ejection murmur.  Abdomen: Soft, non-tender, non-distended with normoactive bowel sounds. No hepatomegaly. No rebound/guarding. No obvious abdominal masses. Msk:  Strength and tone appears normal for age. Extremities: No clubbing, cyanosis or edema.  Distal pedal pulses are 2+ and  equal bilaterally. Neuro: Alert and oriented X 3. Moves all extremities spontaneously. Psych:  Responds to questions appropriately with a normal affect.  ASSESSMENT AND PLAN: 1. Unstable angina. Myoview study positive for inferolateral ischemia. INR today still elevated at 1.85. Will plan cardiac cath but postpone until tomorrow am. Continue beta blocker, nitrates, and IV heparin. Patient could be transferred to telemetry today. Will go ahead and let her eat today. Cardiac cath procedure reviewed in detail. The procedure and risks were reviewed including but not limited to death, myocardial infarction, stroke, arrythmias, bleeding, transfusion, emergency surgery, dye allergy, or renal dysfunction. The patient voices understanding and is agreeable to proceed.  2. Hyperlipidemia. Will increase to high dose statin with ACS.  3. Remote history of DVT/PE  4. Hypothyroidism.   Present on Admission:  . Chest pain  Signed, Peter [place], MDFACC 09/04/2014 7:53 AM

## 2014-09-04 NOTE — Progress Notes (Signed)
ANTICOAGULATION CONSULT NOTE - Follow Up Consult  Pharmacy Consult for heparin Indication: chest pain/ACS  Allergies  Allergen Reactions  . Codeine Nausea And Vomiting    Patient Measurements: Height: 4\' 9"  (144.8 cm) Weight: 142 lb (64.411 kg) IBW/kg (Calculated) : 38.6 Heparin Dosing Weight: 53 kg  Vital Signs: Temp: 98.6 F (37 C) (02/22 0312) Temp Source: Oral (02/22 0312) BP: 90/49 mmHg (02/22 0312)  Labs:  Recent Labs  09/01/14 1853  09/02/14 [id number] 09/02/14 0751 09/02/14 1440 09/02/14 2108 09/03/14 0318 09/04/14 0333  HGB 14.0  --   --  13.3  --   --  12.8 11.7*  HCT 42.1  --   --  40.7  --   --  39.3 35.5*  PLT 147*  --   --  136*  --   --  149* 145*  APTT  --   --  47*  --   --   --   --   --   LABPROT  --   < > 19.0*  --   --   --  23.2* 21.5*  INR  --   < > 1.58*  --   --   --  2.03* 1.85*  HEPARINUNFRC  --   --   --   --   --  0.55 0.42 0.99*  CREATININE 0.77  --   --  0.75  --   --   --  0.72  TROPONINI  --   --  <0.03 <0.03 <0.03  --   --   --   < > = values in this interval not displayed.  Estimated Creatinine Clearance: 52 mL/min (by C-G formula based on Cr of 0.72).   Medications:  Scheduled:  . aspirin  81 mg Oral Daily  . atorvastatin  10 mg Oral q1800  . buPROPion  200 mg Oral BID  . clonazePAM  1 mg Oral BID  . lamoTRIgine  100 mg Oral BID  . levothyroxine  50 mcg Oral QAC breakfast  . metoprolol tartrate  12.5 mg Oral BID WC  . nitroGLYCERIN  0.5 inch Topical 3 times per day  . pantoprazole  40 mg Oral Daily  . sodium chloride  3 mL Intravenous Q12H   Infusions:  . sodium chloride 1 mL/kg/hr (09/04/14 0008)  . sodium chloride 10 mL (09/03/14 2003)  . heparin 650 Units/hr (09/03/14 2003)    Assessment: 69 yo f admitted on 2/20 for CP. 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  INR this AM 1.85.  Warfarin on hold right now and heparin is started for CP/ACS.  HL supratherapeutic this am.  No bleeding noted  Goal of  Therapy:  Heparin level 0.3-0.7 units/ml Monitor platelets by anticoagulation protocol: Yes   Plan: Decrease heparin infusion 550 units/hr Check 6 hr HL Monitor plans for cath, bleeding  Thanks for allowing pharmacy to be a part of this patient's care.  Talbert CageLora Lateasha Breuer, PharmD Clinical Pharmacist, [phone] 09/04/2014 5:08 AM

## 2014-09-05 ENCOUNTER — Encounter (HOSPITAL_COMMUNITY)
Admission: EM | Disposition: A | Discharge status: Home / Self Care | Payer: Self-pay | Source: Home / Self Care | Attending: Internal Medicine

## 2014-09-05 ENCOUNTER — Encounter (HOSPITAL_COMMUNITY): Payer: Self-pay | Admitting: Cardiology

## 2014-09-05 DIAGNOSIS — R0602 Shortness of breath: Secondary | ICD-10-CM | POA: Insufficient documentation

## 2014-09-05 HISTORY — PX: LEFT HEART CATHETERIZATION WITH CORONARY ANGIOGRAM: SHX5451

## 2014-09-05 LAB — CBC
HEMATOCRIT: 36.7 % (ref 36.0–46.0)
HEMOGLOBIN: 12.1 g/dL (ref 12.0–15.0)
MCH: 32.1 pg (ref 26.0–34.0)
MCHC: 33 g/dL (ref 30.0–36.0)
MCV: 97.3 fL (ref 78.0–100.0)
Platelets: 137 10*3/uL — ABNORMAL LOW (ref 150–400)
RBC: 3.77 MIL/uL — AB (ref 3.87–5.11)
RDW: 13 % (ref 11.5–15.5)
WBC: 5.8 10*3/uL (ref 4.0–10.5)

## 2014-09-05 LAB — HEPARIN LEVEL (UNFRACTIONATED): Heparin Unfractionated: 0.52 IU/mL (ref 0.30–0.70)

## 2014-09-05 LAB — CBC AND DIFFERENTIAL: Hemoglobin: 12.1 g/dL (ref 12.0–16.0)

## 2014-09-05 LAB — PROTIME-INR
INR: 1.3 (ref 0.00–1.49)
Prothrombin Time: 16.3 seconds — ABNORMAL HIGH (ref 11.6–15.2)

## 2014-09-05 SURGERY — LEFT HEART CATHETERIZATION WITH CORONARY ANGIOGRAM
Anesthesia: LOCAL

## 2014-09-05 MED ORDER — SODIUM CHLORIDE 0.9 % IV SOLN: 1.0000 mL/kg/h | INTRAVENOUS | Status: AC

## 2014-09-05 MED ORDER — ONDANSETRON HCL 4 MG/2ML IJ SOLN: 4.0000 mg | Freq: Four times a day (QID) | INTRAMUSCULAR | Status: DC | PRN

## 2014-09-05 MED ORDER — HEPARIN (PORCINE) IN NACL 2-0.9 UNIT/ML-% IJ SOLN
INTRAMUSCULAR | Status: AC
  Filled 2014-09-05: qty 1000

## 2014-09-05 MED ORDER — SODIUM CHLORIDE 0.9 % IV BOLUS (SEPSIS)
500.0000 mL | Freq: Once | INTRAVENOUS | Status: AC
  Administered 2014-09-05: 18:00:00 500 mL via INTRAVENOUS

## 2014-09-05 MED ORDER — ASPIRIN 81 MG PO CHEW: 81.0000 mg | CHEWABLE_TABLET | Freq: Every day | ORAL | Status: DC

## 2014-09-05 MED ORDER — HEPARIN SODIUM (PORCINE) 1000 UNIT/ML IJ SOLN
INTRAMUSCULAR | Status: AC
  Filled 2014-09-05: qty 1

## 2014-09-05 MED ORDER — ACETAMINOPHEN 325 MG PO TABS
ORAL_TABLET | ORAL | Status: AC
  Filled 2014-09-05: qty 2

## 2014-09-05 MED ORDER — NITROGLYCERIN 1 MG/10 ML FOR IR/CATH LAB
INTRA_ARTERIAL | Status: AC
  Filled 2014-09-05: qty 10

## 2014-09-05 MED ORDER — WARFARIN SODIUM 7.5 MG PO TABS
7.5000 mg | ORAL_TABLET | Freq: Once | ORAL | Status: AC
  Administered 2014-09-05: 7.5 mg via ORAL
  Filled 2014-09-05: qty 1

## 2014-09-05 MED ORDER — LIDOCAINE HCL (PF) 1 % IJ SOLN
INTRAMUSCULAR | Status: AC
  Filled 2014-09-05: qty 30

## 2014-09-05 MED ORDER — FENTANYL CITRATE 0.05 MG/ML IJ SOLN
INTRAMUSCULAR | Status: AC
  Filled 2014-09-05: qty 2

## 2014-09-05 MED ORDER — VERAPAMIL HCL 2.5 MG/ML IV SOLN
INTRAVENOUS | Status: AC
  Filled 2014-09-05: qty 2

## 2014-09-05 MED ORDER — MIDAZOLAM HCL 2 MG/2ML IJ SOLN
INTRAMUSCULAR | Status: AC
Start: 2014-09-05 — End: 2014-09-05
  Filled 2014-09-05: qty 2

## 2014-09-05 MED ORDER — ACETAMINOPHEN 325 MG PO TABS
650.0000 mg | ORAL_TABLET | ORAL | Status: DC | PRN
  Administered 2014-09-05: 650 mg via ORAL

## 2014-09-05 MED ORDER — METOPROLOL TARTRATE 25 MG PO TABS: 12.5000 mg | ORAL_TABLET | Freq: Two times a day (BID) | ORAL | Status: DC

## 2014-09-05 MED ORDER — WARFARIN - PHARMACIST DOSING INPATIENT: Freq: Every day | Status: DC

## 2014-09-05 NOTE — Interval H&P Note (Signed)
History and Physical Interval Note:  09/05/2014 8:46 AM  Kathrynn DuckingSharon J Gillespie  has presented today for surgery, with the diagnosis of shortness of breath/unstable angina.  The various methods of treatment have been discussed with the patient and family. After consideration of risks, benefits and other options for treatment, the patient has consented to  Procedure(s): LEFT HEART CATHETERIZATION WITH CORONARY ANGIOGRAM (N/A) +/- PCI as a surgical intervention .  The patient's history has been reviewed, patient examined, no change in status, stable for surgery.  I have reviewed the patient's chart and labs.  Questions were answered to the patient's satisfaction.    Cath Lab Visit (complete for each Cath Lab visit)  Clinical Evaluation Leading to the Procedure:   ACS: Yes.    Non-ACS:    Anginal Classification: CCS IV  Anti-ischemic medical therapy: Maximal Therapy (2 or more classes of medications)  Non-Invasive Test Results: No non-invasive testing performed  Prior CABG: No previous CABG  HARDING, DAVID W  TIMI SCORE  Patient Information:  TIMI Score is 3  UA/NSTEMI and intermediate-risk features (e.g., TIMI score 3?4) for short-term risk of death or nonfatal MI  Revascularization of the presumed culprit artery   A (9)  Indication: 10; Score: 9  HARDING, Piedad ClimesAVID W, M.D., M.S. Interventional Cardiologist   Pager # [phone]

## 2014-09-05 NOTE — Progress Notes (Signed)
ANTICOAGULATION CONSULT NOTE - Follow Up Consult  Pharmacy Consult for Coumadin Indication: DVT (history)  Allergies  Allergen Reactions  . Codeine Nausea And Vomiting    Patient Measurements: Height: 4\' 9"  (144.8 cm) Weight: 142 lb (64.411 kg) IBW/kg (Calculated) : 38.6 Heparin Dosing Weight: 53 kg  Vital Signs: Temp: 97.8 F (36.6 C) (02/23 0814) Temp Source: Oral (02/23 0814) BP: 108/39 mmHg (02/23 1215) Pulse Rate: 58 (02/23 1215)  Labs:  Recent Labs  09/02/14 1440  09/03/14 0318 09/04/14 0333 09/04/14 1205 09/04/14 1853 09/05/14 0327  HGB  --   < > 12.8 11.7*  --   --  12.1  HCT  --   --  39.3 35.5*  --   --  36.7  PLT  --   --  149* 145*  --   --  137*  LABPROT  --   --  23.2* 21.5*  --   --  16.3*  INR  --   --  2.03* 1.85*  --   --  1.30  HEPARINUNFRC  --   < > 0.42 0.99* 0.81* 0.61 0.52  CREATININE  --   --   --  0.72  --   --   --   TROPONINI <0.03  --   --   --   --   --   --   < > = values in this interval not displayed.  Estimated Creatinine Clearance: 52 mL/min (by C-G formula based on Cr of 0.72).   Medications:  Scheduled:  . aspirin  81 mg Oral Daily  . atorvastatin  80 mg Oral q1800  . buPROPion  200 mg Oral BID  . clonazePAM  1 mg Oral BID  . lamoTRIgine  100 mg Oral BID  . levothyroxine  50 mcg Oral QAC breakfast  . metoprolol tartrate  12.5 mg Oral BID WC  . nitroGLYCERIN  0.5 inch Topical 3 times per day  . pantoprazole  40 mg Oral Daily   Infusions:  . sodium chloride 10 mL (09/03/14 2003)  . sodium chloride 1 mL/kg/hr (09/05/14 1006)    Assessment: 69 yo F admitted on 2/20 for CP. 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  INR this AM 1.3.  Pt s/p cath with noted small vessel disease, not amenable to PCI.  Restart Coumadin per pharmacy.  INR 1.3.  Goal of Therapy:  INR 2-3 Monitor platelets by anticoagulation protocol: Yes   Plan: Coumadin 7.5 mg PO x 1 tonight. Daily INR.  Toys 'R' UsKimberly Keondra Haydu, Pharm.D.,  BCPS Clinical Pharmacist Pager [phone] 09/05/2014 1:26 PM

## 2014-09-05 NOTE — Progress Notes (Signed)
TELEMETRY: Reviewed telemetry pt in NSR: Filed Vitals:   09/04/14 1611 09/04/14 1900 09/05/14 0002 09/05/14 0318  BP: 100/72 91/69 93/43  113/70  Pulse: 61 54 56   Temp: 98.3 F (36.8 C) 98.1 F (36.7 C) 98.1 F (36.7 C) 98.2 F (36.8 C)  TempSrc: Oral Oral Oral Oral  Resp: 18 16 18 16   Height:      Weight:      SpO2: 100% 97% 97% 94%    Intake/Output Summary (Last 24 hours) at 09/05/14 0751 Last data filed at 09/05/14 0700  Gross per 24 hour  Intake 1141.72 ml  Output      0 ml  Net 1141.72 ml   Filed Weights   09/01/14 1844  Weight: 142 lb (64.411 kg)    Subjective No recurrent chest pain. No SOB. Feels well.  . aspirin  81 mg Oral Daily  . atorvastatin  80 mg Oral q1800  . buPROPion  200 mg Oral BID  . clonazePAM  1 mg Oral BID  . lamoTRIgine  100 mg Oral BID  . levothyroxine  50 mcg Oral QAC breakfast  . metoprolol tartrate  12.5 mg Oral BID WC  . nitroGLYCERIN  0.5 inch Topical 3 times per day  . pantoprazole  40 mg Oral Daily  . sodium chloride  3 mL Intravenous Q12H  . sodium chloride  3 mL Intravenous Q12H   . sodium chloride 10 mL (09/03/14 2003)  . sodium chloride 1 mL/kg/hr (09/05/14 0312)  . heparin 450 Units/hr (09/04/14 1500)    LABS: Basic Metabolic Panel:  Recent Labs  96/29/52 0333  NA 138  K 3.7  CL 110  CO2 24  GLUCOSE 91  BUN 13  CREATININE 0.72  CALCIUM 8.4   Liver Function Tests: No results for input(s): AST, ALT, ALKPHOS, BILITOT, PROT, ALBUMIN in the last 72 hours. No results for input(s): LIPASE, AMYLASE in the last 72 hours. CBC:  Recent Labs  09/04/14 0333 09/05/14 0327  WBC 5.5 5.8  HGB 11.7* 12.1  HCT 35.5* 36.7  MCV 95.7 97.3  PLT 145* 137*   Cardiac Enzymes:  Recent Labs  09/02/14 1440  TROPONINI <0.03   BNP: No results for input(s): PROBNP in the last 72 hours. D-Dimer: No results for input(s): DDIMER in the last 72 hours. Hemoglobin A1C: No results for input(s): HGBA1C in the last 72  hours. Fasting Lipid Panel: No results for input(s): CHOL, HDL, LDLCALC, TRIG, CHOLHDL, LDLDIRECT in the last 72 hours. Thyroid Function Tests:  Recent Labs  09/04/14 0333  TSH 1.476     Radiology/Studies:  No results found. Ecg: NSR with LVH. No acute change.  Transthoracic Echocardiography  Patient:  Kathryn Mathews, Kathryn Mathews MR #:    [id number] Study Date: 09/02/2014 Gender:   F Age:    69 Height:   144.8 cm Weight:   64.4 kg BSA:    1.64 m^2 Pt. Status: Room:    2H24C  ADMITTING  Pearson Grippe [address] [id number] [place] [id number] Randon Goldsmith, Edson Snowball [id number] PERFORMING  [facility], Inpatient SONOGRAPHER Sheralyn Boatman  cc:  ------------------------------------------------------------------- LV EF: 60% -  65%  ------------------------------------------------------------------- Indications:   Murmur 785.2.  ------------------------------------------------------------------- History:  PMH:  Angina pectoris. Risk factors: Hypertension. Dyslipidemia.  ------------------------------------------------------------------- Study Conclusions  - Left ventricle: The cavity size was normal. Systolic function was normal. The estimated ejection fraction was in the range of 60% to 65%. Wall motion was normal; there were  no regional wall motion abnormalities. There was an increased relative contribution of atrial contraction to ventricular filling. Doppler parameters are consistent with abnormal left ventricular relaxation (grade 1 diastolic dysfunction). - Aortic valve: Mildly calcified annulus. Trileaflet; normal thickness leaflets. Valve area (VTI): 1.49 cm^2. Valve area (Vmax): 1.36 cm^2. Valve area (Vmean): 1.39 cm^2. - Pulmonic valve: There was no regurgitation.  Transthoracic echocardiography. M-mode, complete 2D, spectral Doppler, and color Doppler. Birthdate: Patient  birthdate: 1946-02-15. Age: Patient is 68 yr old. Sex: Gender: female. BMI: 30.7 kg/m^2. Blood pressure:   94/72 Patient status: Inpatient. Study date: Study date: 09/02/2014. Study time: 03:19 PM. Location: ICU/CCU  -------------------------------------------------------------------  ------------------------------------------------------------------- Left ventricle: The cavity size was normal. Systolic function was normal. The estimated ejection fraction was in the range of 60% to 65%. Wall motion was normal; there were no regional wall motion abnormalities. There was an increased relative contribution of atrial contraction to ventricular filling. Doppler parameters are consistent with abnormal left ventricular relaxation (grade 1 diastolic dysfunction).  ------------------------------------------------------------------- Aortic valve:  Mildly calcified annulus. Trileaflet; normal thickness leaflets. Mobility was not restricted. Doppler: Transvalvular velocity was within the normal range. There was no stenosis. There was no regurgitation.  VTI ratio of LVOT to aortic valve: 0.59. Valve area (VTI): 1.49 cm^2. Indexed valve area (VTI): 0.91 cm^2/m^2. Valve area (Vmax): 1.36 cm^2. Indexed valve area (Vmax): 0.83 cm^2/m^2. Mean velocity ratio of LVOT to aortic valve: 0.55. Valve area (Vmean): 1.39 cm^2. Indexed valve area (Vmean): 0.85 cm^2/m^2.  Mean gradient (S): 6 mm Hg. Peak gradient (S): 15 mm Hg.  ------------------------------------------------------------------- Aorta: Aortic root: The aortic root was normal in size.  ------------------------------------------------------------------- Mitral valve:  Structurally normal valve.  Mobility was not restricted. Doppler: Transvalvular velocity was within the normal range. There was no evidence for stenosis. There was no regurgitation.  Peak gradient (D): 3 mm  Hg.  ------------------------------------------------------------------- Left atrium: The atrium was normal in size.  ------------------------------------------------------------------- Right ventricle: The cavity size was normal. Wall thickness was normal. Systolic function was normal.  ------------------------------------------------------------------- Pulmonic valve:  Structurally normal valve.  Cusp separation was normal. Doppler: Transvalvular velocity was within the normal range. There was no evidence for stenosis. There was no regurgitation.  ------------------------------------------------------------------- Tricuspid valve:  Structurally normal valve.  Doppler: Transvalvular velocity was within the normal range. There was no regurgitation.  ------------------------------------------------------------------- Pulmonary artery:  The main pulmonary artery was normal-sized. Systolic pressure was within the normal range.  ------------------------------------------------------------------- Right atrium: The atrium was normal in size.  ------------------------------------------------------------------- Pericardium: There was no pericardial effusion.  ------------------------------------------------------------------- Systemic veins: Inferior vena cava: The vessel was normal in size.  ------------------------------------------------------------------- Measurements  Left ventricle              Value     Reference LV ID, ED, PLAX chordal      (L)   30.2 mm    43 - 52 LV ID, ES, PLAX chordal      (L)   16.7 mm    23 - 38 LV fx shortening, PLAX chordal      45  %    >=29 LV PW thickness, ED            9.66 mm    --------- IVS/LV PW ratio, ED            0.88      <=1.3 Stroke volume, 2D             49  ml    --------- Stroke volume/bsa, 2D  30  ml/m^2  --------- LV e&', lateral              8.58 cm/s   --------- LV E/e&', lateral             9.56      --------- LV e&', medial               7.4  cm/s   --------- LV E/e&', medial              11.08     --------- LV e&', average              7.99 cm/s   --------- LV E/e&', average             10.26     ---------  Ventricular septum            Value     Reference IVS thickness, ED             8.49 mm    ---------  LVOT                   Value     Reference LVOT ID, S                18  mm    --------- LVOT area                 2.54 cm^2   --------- LVOT peak velocity, S           104  cm/s   --------- LVOT mean velocity, S           60  cm/s   --------- LVOT VTI, S                19.4 cm    --------- LVOT peak gradient, S           4   mm Hg  ---------  Aortic valve               Value     Reference Aortic valve mean velocity, S       110  cm/s   --------- Aortic valve VTI, S            33  cm    --------- Aortic mean gradient, S          6   mm Hg  --------- Aortic peak gradient, S          15  mm Hg  --------- VTI ratio, LVOT/AV            0.59      --------- Aortic valve area, VTI          1.49 cm^2   --------- Aortic valve area/bsa, VTI        0.91 cm^2/m^2 --------- Aortic valve area, peak velocity     1.36 cm^2   --------- Aortic valve area/bsa, peak        0.83 cm^2/m^2 --------- velocity Velocity ratio, mean, LVOT/AV       0.55      --------- Aortic valve area, mean velocity     1.39 cm^2   --------- Aortic valve area/bsa, mean         0.85 cm^2/m^2 --------- velocity  Aorta                   Value     Reference Aortic root ID, ED  29  mm    ---------  Left atrium                Value     Reference LA ID, A-P, ES              29  mm    --------- LA ID/bsa, A-P              1.77 cm/m^2  <=2.2 LA volume, S               28.1 ml    --------- LA volume/bsa, S             17.2 ml/m^2  --------- LA volume, ES, 1-p A4C          30.6 ml    --------- LA volume/bsa, ES, 1-p A4C        18.7 ml/m^2  --------- LA volume, ES, 1-p A2C          23.8 ml    --------- LA volume/bsa, ES, 1-p A2C        14.5 ml/m^2  ---------  Mitral valve               Value     Reference Mitral E-wave peak velocity        82  cm/s   --------- Mitral A-wave peak velocity        113  cm/s   --------- Mitral deceleration time     (H)   356  ms    150 - 230 Mitral peak gradient, D          3   mm Hg  --------- Mitral E/A ratio, peak          0.7      ---------  Systemic veins              Value     Reference Estimated CVP               3   mm Hg  ---------  Right ventricle              Value     Reference RV s&', lateral, S             12.9 cm/s   ---------  Legend: (L) and (H) mark values outside specified reference range.  ------------------------------------------------------------------- Prepared and Electronically Authenticated by  Armanda Magic, MD 2016-02-20T16:56:35  PHYSICAL EXAM General: Well developed, well nourished, in no acute distress. Head: Normocephalic, atraumatic, sclera non-icteric, oropharynx is clear Neck: Negative for carotid bruits. JVD not elevated.  No adenopathy Lungs: Clear bilaterally to auscultation without wheezes, rales, or rhonchi. Breathing is unlabored. Heart: RRR S1 S2 with soft systolic ejection murmur.  Abdomen: Soft, non-tender, non-distended with normoactive bowel sounds. No hepatomegaly. No rebound/guarding. No obvious abdominal masses. Msk:  Strength and tone appears normal for age. Extremities: No clubbing, cyanosis or edema.  Distal pedal pulses are 2+ and equal bilaterally. Neuro: Alert and oriented X 3. Moves all extremities spontaneously. Psych:  Responds to questions appropriately with a normal affect.  ASSESSMENT AND PLAN: 1. Unstable angina. Myoview study positive for inferolateral ischemia. INR today down to 1.3. Will proceed with cardiac cath today.  Continue beta blocker and IV heparin. Will DC topical nitrates given low BP.  Disposition pending results of cardiac cath today. Patient will need to resume anticoagulation with coumadin post procedure.   2. Hyperlipidemia. On high dose statin with ACS.  3. Remote history  of DVT/PE  4. Hypothyroidism. TSh is normal.  Present on Admission:  . Essential hypertension . Hyperlipidemia . Unstable angina  Signed, Gabbi Whetstone [place], MDFACC 09/05/2014 7:51 AM

## 2014-09-05 NOTE — Progress Notes (Signed)
Right radial site re-bleed.  Band removed and 15 min pressure held to site.  Small bruise at site.  Site is soft.  Patient states pain at a 5 out of 10 at the right wrist and hand.  Tegagerm and dressing applied to site.  Positive radial waveform and pulse to hand is palpable. Vitals stable, Bp 113/66, sr 58 BPM, spo2 99%  Sandi Eddins, RTR, RCIS

## 2014-09-05 NOTE — H&P (View-Only) (Signed)
TELEMETRY: Reviewed telemetry pt in NSR: Filed Vitals:   09/04/14 1611 09/04/14 1900 09/05/14 0002 09/05/14 0318  BP: 100/72 91/69 93/43  113/70  Pulse: 61 54 56   Temp: 98.3 F (36.8 C) 98.1 F (36.7 C) 98.1 F (36.7 C) 98.2 F (36.8 C)  TempSrc: Oral Oral Oral Oral  Resp: 18 16 18 16   Height:      Weight:      SpO2: 100% 97% 97% 94%    Intake/Output Summary (Last 24 hours) at 09/05/14 0751 Last data filed at 09/05/14 0700  Gross per 24 hour  Intake 1141.72 ml  Output      0 ml  Net 1141.72 ml   Filed Weights   09/01/14 1844  Weight: 142 lb (64.411 kg)    Subjective No recurrent chest pain. No SOB. Feels well.  . aspirin  81 mg Oral Daily  . atorvastatin  80 mg Oral q1800  . buPROPion  200 mg Oral BID  . clonazePAM  1 mg Oral BID  . lamoTRIgine  100 mg Oral BID  . levothyroxine  50 mcg Oral QAC breakfast  . metoprolol tartrate  12.5 mg Oral BID WC  . nitroGLYCERIN  0.5 inch Topical 3 times per day  . pantoprazole  40 mg Oral Daily  . sodium chloride  3 mL Intravenous Q12H  . sodium chloride  3 mL Intravenous Q12H   . sodium chloride 10 mL (09/03/14 2003)  . sodium chloride 1 mL/kg/hr (09/05/14 0312)  . heparin 450 Units/hr (09/04/14 1500)    LABS: Basic Metabolic Panel:  Recent Labs  96/29/52 0333  NA 138  K 3.7  CL 110  CO2 24  GLUCOSE 91  BUN 13  CREATININE 0.72  CALCIUM 8.4   Liver Function Tests: No results for input(s): AST, ALT, ALKPHOS, BILITOT, PROT, ALBUMIN in the last 72 hours. No results for input(s): LIPASE, AMYLASE in the last 72 hours. CBC:  Recent Labs  09/04/14 0333 09/05/14 0327  WBC 5.5 5.8  HGB 11.7* 12.1  HCT 35.5* 36.7  MCV 95.7 97.3  PLT 145* 137*   Cardiac Enzymes:  Recent Labs  09/02/14 1440  TROPONINI <0.03   BNP: No results for input(s): PROBNP in the last 72 hours. D-Dimer: No results for input(s): DDIMER in the last 72 hours. Hemoglobin A1C: No results for input(s): HGBA1C in the last 72  hours. Fasting Lipid Panel: No results for input(s): CHOL, HDL, LDLCALC, TRIG, CHOLHDL, LDLDIRECT in the last 72 hours. Thyroid Function Tests:  Recent Labs  09/04/14 0333  TSH 1.476     Radiology/Studies:  No results found. Ecg: NSR with LVH. No acute change.  Transthoracic Echocardiography  Patient:  Taleigh, Sepe MR #:    [id number] Study Date: 09/02/2014 Gender:   F Age:    69 Height:   144.8 cm Weight:   64.4 kg BSA:    1.64 m^2 Pt. Status: Room:    2H24C  ADMITTING  Pearson Grippe [address] [id number] [place] [id number] Randon Goldsmith, Edson Snowball [id number] PERFORMING  [facility], Inpatient SONOGRAPHER Sheralyn Boatman  cc:  ------------------------------------------------------------------- LV EF: 60% -  65%  ------------------------------------------------------------------- Indications:   Murmur 785.2.  ------------------------------------------------------------------- History:  PMH:  Angina pectoris. Risk factors: Hypertension. Dyslipidemia.  ------------------------------------------------------------------- Study Conclusions  - Left ventricle: The cavity size was normal. Systolic function was normal. The estimated ejection fraction was in the range of 60% to 65%. Wall motion was normal; there were  no regional wall motion abnormalities. There was an increased relative contribution of atrial contraction to ventricular filling. Doppler parameters are consistent with abnormal left ventricular relaxation (grade 1 diastolic dysfunction). - Aortic valve: Mildly calcified annulus. Trileaflet; normal thickness leaflets. Valve area (VTI): 1.49 cm^2. Valve area (Vmax): 1.36 cm^2. Valve area (Vmean): 1.39 cm^2. - Pulmonic valve: There was no regurgitation.  Transthoracic echocardiography. M-mode, complete 2D, spectral Doppler, and color Doppler. Birthdate: Patient  birthdate: 1946-02-15. Age: Patient is 68 yr old. Sex: Gender: female. BMI: 30.7 kg/m^2. Blood pressure:   94/72 Patient status: Inpatient. Study date: Study date: 09/02/2014. Study time: 03:19 PM. Location: ICU/CCU  -------------------------------------------------------------------  ------------------------------------------------------------------- Left ventricle: The cavity size was normal. Systolic function was normal. The estimated ejection fraction was in the range of 60% to 65%. Wall motion was normal; there were no regional wall motion abnormalities. There was an increased relative contribution of atrial contraction to ventricular filling. Doppler parameters are consistent with abnormal left ventricular relaxation (grade 1 diastolic dysfunction).  ------------------------------------------------------------------- Aortic valve:  Mildly calcified annulus. Trileaflet; normal thickness leaflets. Mobility was not restricted. Doppler: Transvalvular velocity was within the normal range. There was no stenosis. There was no regurgitation.  VTI ratio of LVOT to aortic valve: 0.59. Valve area (VTI): 1.49 cm^2. Indexed valve area (VTI): 0.91 cm^2/m^2. Valve area (Vmax): 1.36 cm^2. Indexed valve area (Vmax): 0.83 cm^2/m^2. Mean velocity ratio of LVOT to aortic valve: 0.55. Valve area (Vmean): 1.39 cm^2. Indexed valve area (Vmean): 0.85 cm^2/m^2.  Mean gradient (S): 6 mm Hg. Peak gradient (S): 15 mm Hg.  ------------------------------------------------------------------- Aorta: Aortic root: The aortic root was normal in size.  ------------------------------------------------------------------- Mitral valve:  Structurally normal valve.  Mobility was not restricted. Doppler: Transvalvular velocity was within the normal range. There was no evidence for stenosis. There was no regurgitation.  Peak gradient (D): 3 mm  Hg.  ------------------------------------------------------------------- Left atrium: The atrium was normal in size.  ------------------------------------------------------------------- Right ventricle: The cavity size was normal. Wall thickness was normal. Systolic function was normal.  ------------------------------------------------------------------- Pulmonic valve:  Structurally normal valve.  Cusp separation was normal. Doppler: Transvalvular velocity was within the normal range. There was no evidence for stenosis. There was no regurgitation.  ------------------------------------------------------------------- Tricuspid valve:  Structurally normal valve.  Doppler: Transvalvular velocity was within the normal range. There was no regurgitation.  ------------------------------------------------------------------- Pulmonary artery:  The main pulmonary artery was normal-sized. Systolic pressure was within the normal range.  ------------------------------------------------------------------- Right atrium: The atrium was normal in size.  ------------------------------------------------------------------- Pericardium: There was no pericardial effusion.  ------------------------------------------------------------------- Systemic veins: Inferior vena cava: The vessel was normal in size.  ------------------------------------------------------------------- Measurements  Left ventricle              Value     Reference LV ID, ED, PLAX chordal      (L)   30.2 mm    43 - 52 LV ID, ES, PLAX chordal      (L)   16.7 mm    23 - 38 LV fx shortening, PLAX chordal      45  %    >=29 LV PW thickness, ED            9.66 mm    --------- IVS/LV PW ratio, ED            0.88      <=1.3 Stroke volume, 2D             49  ml    --------- Stroke volume/bsa, 2D  30  ml/m^2  --------- LV e&', lateral              8.58 cm/s   --------- LV E/e&', lateral             9.56      --------- LV e&', medial               7.4  cm/s   --------- LV E/e&', medial              11.08     --------- LV e&', average              7.99 cm/s   --------- LV E/e&', average             10.26     ---------  Ventricular septum            Value     Reference IVS thickness, ED             8.49 mm    ---------  LVOT                   Value     Reference LVOT ID, S                18  mm    --------- LVOT area                 2.54 cm^2   --------- LVOT peak velocity, S           104  cm/s   --------- LVOT mean velocity, S           60  cm/s   --------- LVOT VTI, S                19.4 cm    --------- LVOT peak gradient, S           4   mm Hg  ---------  Aortic valve               Value     Reference Aortic valve mean velocity, S       110  cm/s   --------- Aortic valve VTI, S            33  cm    --------- Aortic mean gradient, S          6   mm Hg  --------- Aortic peak gradient, S          15  mm Hg  --------- VTI ratio, LVOT/AV            0.59      --------- Aortic valve area, VTI          1.49 cm^2   --------- Aortic valve area/bsa, VTI        0.91 cm^2/m^2 --------- Aortic valve area, peak velocity     1.36 cm^2   --------- Aortic valve area/bsa, peak        0.83 cm^2/m^2 --------- velocity Velocity ratio, mean, LVOT/AV       0.55      --------- Aortic valve area, mean velocity     1.39 cm^2   --------- Aortic valve area/bsa, mean         0.85 cm^2/m^2 --------- velocity  Aorta                   Value     Reference Aortic root ID, ED  29  mm    ---------  Left atrium                Value     Reference LA ID, A-P, ES              29  mm    --------- LA ID/bsa, A-P              1.77 cm/m^2  <=2.2 LA volume, S               28.1 ml    --------- LA volume/bsa, S             17.2 ml/m^2  --------- LA volume, ES, 1-p A4C          30.6 ml    --------- LA volume/bsa, ES, 1-p A4C        18.7 ml/m^2  --------- LA volume, ES, 1-p A2C          23.8 ml    --------- LA volume/bsa, ES, 1-p A2C        14.5 ml/m^2  ---------  Mitral valve               Value     Reference Mitral E-wave peak velocity        82  cm/s   --------- Mitral A-wave peak velocity        113  cm/s   --------- Mitral deceleration time     (H)   356  ms    150 - 230 Mitral peak gradient, D          3   mm Hg  --------- Mitral E/A ratio, peak          0.7      ---------  Systemic veins              Value     Reference Estimated CVP               3   mm Hg  ---------  Right ventricle              Value     Reference RV s&', lateral, S             12.9 cm/s   ---------  Legend: (L) and (H) mark values outside specified reference range.  ------------------------------------------------------------------- Prepared and Electronically Authenticated by  Armanda Magic, MD 2016-02-20T16:56:35  PHYSICAL EXAM General: Well developed, well nourished, in no acute distress. Head: Normocephalic, atraumatic, sclera non-icteric, oropharynx is clear Neck: Negative for carotid bruits. JVD not elevated.  No adenopathy Lungs: Clear bilaterally to auscultation without wheezes, rales, or rhonchi. Breathing is unlabored. Heart: RRR S1 S2 with soft systolic ejection murmur.  Abdomen: Soft, non-tender, non-distended with normoactive bowel sounds. No hepatomegaly. No rebound/guarding. No obvious abdominal masses. Msk:  Strength and tone appears normal for age. Extremities: No clubbing, cyanosis or edema.  Distal pedal pulses are 2+ and equal bilaterally. Neuro: Alert and oriented X 3. Moves all extremities spontaneously. Psych:  Responds to questions appropriately with a normal affect.  ASSESSMENT AND PLAN: 1. Unstable angina. Myoview study positive for inferolateral ischemia. INR today down to 1.3. Will proceed with cardiac cath today.  Continue beta blocker and IV heparin. Will DC topical nitrates given low BP.  Disposition pending results of cardiac cath today. Patient will need to resume anticoagulation with coumadin post procedure.   2. Hyperlipidemia. On high dose statin with ACS.  3. Remote history  of DVT/PE  4. Hypothyroidism. TSh is normal.  Present on Admission:  . Essential hypertension . Hyperlipidemia . Unstable angina  Signed, Gabbi Whetstone [place], MDFACC 09/05/2014 7:51 AM

## 2014-09-05 NOTE — CV Procedure (Signed)
CARDIAC CATHETERIZATION REPORT  NAME:  Kathryn Mathews   MRN: [id number] DOB:  [birth year]   ADMIT DATE: 09/01/2014 Procedure Date: 09/05/2014  INTERVENTIONAL CARDIOLOGIST: Leonie Man, M.D., MS PRIMARY CARE PROVIDER: Ria Bush, MD PRIMARY CARDIOLOGIST: Dr. Martinique  PATIENT:  Kathryn Mathews is a 69 y.o. 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 She presented on February 19 with chest pain concerning for stable angina. She was initially admitted to the internal medicine service and underwent nuclear stress testing that demonstrated possible inferolateral ischemia with normal EF.  PRE-OPERATIVE DIAGNOSIS:    Chest Pain concerning for Unstable Angina  Abnormal Nuclear Stress Test - INTERMEDIATE RISK  PROCEDURES PERFORMED:    Left Heart Catheterization with Native Coronary Angiography  via Right Radial Artery   Left Ventriculography  PROCEDURE: The patient was brought to the [facility] in the fasting state and prepped and draped in the usual sterile fashion for Right Radial artery access. A modified Allen's test was performed on the right wrist demonstrating excellent collateral flow for radial access.   Sterile technique was used including antiseptics, cap, gloves, gown, hand hygiene, mask and sheet. Skin prep: Chlorhexidine.   Consent: Risks of procedure as well as the alternatives and risks of each were explained to the (patient/caregiver). Consent for procedure obtained.   Time Out: Verified patient identification, verified procedure, site/side was marked, verified correct patient position, special equipment/implants available, medications/allergies/relevent history reviewed, required imaging and test results available. Performed.  Access:   Right Radial Artery: 6 Fr Sheath -  Seldinger Technique (Angiocath Micropuncture Kit)  Radial Cocktail - 10 mL; IV Heparin 3500 Units   Left  Heart Catheterization: 5 Fr Catheters advanced over a Versicore wire and exchanged over a  long exchange safety J-wire;  TIG 4.0 catheter advanced first.  Left Coronary Artery Cineangiography:  TIG 4.0 Catheter  Right Coronary Artery Cineangiography:  JR4 Catheter   LV Hemodynamics (LV Gram - hand injection):  TIG 4.0  Sheath removed in the  cardiac catheterization lab with  TR band placement for hemostasis.  TR Band:  0940  Hours;  15 mL air  FINDINGS:  Hemodynamics:   Central Aortic Pressure / Mean:  96/51/70 mmHg  Left Ventricular Pressure / LVEDP:  102/0/4 mmHg  Left Ventriculography:  EF:  60-65 %  Wall Motion:  Normal  Coronary Anatomy:  Dominance:  Right  Left Main:  Normal caliber vessel that Trifurcates into the LAD, Circumflex and Ramus Intermedius. Angiographically normal. LAD: Normal caliber vessel that courses down around the apex perfusing the inferoapex. Several small diagonal branches but none that are significant. Mild intramyocardial bridging in the distal mid section otherwise angiographically normal.  Left Circumflex: Normal caliber, nondominant vessel it gives rise to a very small caliber marginal branch that has diffuse disease. (This is the only vessel that potentially would be a culprit lesion but is <1 mm diameter). The vessel then bifurcates into the AV groove circumflex and a lateral OM. The AV groove circumflex continues down as a small caliber vessel gives rise to a small posterior lateral branch.  Lateral OM1:  Normal caliber somewhat tortuous vessel that bifurcates into 2 small caliber branches distally. Tortuous but angiographically normal.  Ramus intermedius:  Large-caliber vessel (at least as big as the LAD) that courses more like a high diagonal branch with a small proximal branch that is like an OM. As it reaches almost all the way  to the apex and is relatively free of disease.   RCA:  Normal caliber, dominant vessel with perhaps 20% mid  stenosis prior to the crux. The vessel then bifurcates distally into the  Right Posterior AV Groove Branch (RPAV) and the Right Posterior Descending Artery (RPDA). Minimal luminal irregularities.  RPDA:  Moderate caliber vessel that reaches two thirds the way to the apex and tapered fashion. Tortuous but free of disease.  RPL Sysytem:The RPAV  begins as a moderate caliber vessel that gives off a small caliber RPL 1 and RPL 3 before terminating as a moderate caliber RPL 2 it covers a major portion of the posterior lateral wall. Minimal luminal irregularities.  MEDICATIONS:  Anesthesia:  Local Lidocaine  2 ml  Sedation:  2 mg IV Versed,  25 mcg IV fentanyl ;   Omnipaque Contrast:  60 ml  Anticoagulation:  IV Heparin  3500 Units Radial Cocktail: 5 mg Verapamil, 400 mcg NTG, 2 ml 2% Lidocaine in 10 ml NS  PATIENT DISPOSITION:    The patient was transferred to the PACU holding area in a hemodynamicaly stable, chest pain free condition.  The patient tolerated the procedure well, and there were no complications.  EBL:   < 5 ml  The patient was stable before, during, and after the procedure.  POST-OPERATIVE DIAGNOSIS:    Angiographically minimal coronary disease with only a very small caliber OM branch that service a culprit lesion for symptoms. Not PCI amenable.  Well-preserved LVEF with normal LVEDP  PLAN OF CARE:  The patient likely be transferred to a telemetry unit for post radial cath care.   We'll restart oral anticoagulation with warfarin.  Anticipate that she can likely be discharged soon.    Leonie Man, M.D., M.S. Interventional Cardiologist   Pager # [phone]

## 2014-09-06 NOTE — Discharge Summary (Signed)
Physician Discharge Summary  Kathryn DuckingSharon J Mathews NWG:[id number] DOB: 1946-05-19 DOA: 09/01/2014  PCP: Eustaquio BoydenJavier Gutierrez, MD  Admit date: 09/01/2014 Discharge date: 09/05/2014  Time spent: 30 minutes  Recommendations for Outpatient Follow-up:  1. Follow up with PCP in 1 to 2 days.   Discharge Diagnoses:  Principal Problem:   Unstable angina Active Problems:   Essential hypertension   Hyperlipidemia   Abnormal nuclear stress test   Shortness of breath   Discharge Condition: improved  Diet recommendation: low sodium diet.   Filed Weights   09/01/14 1844  Weight: 64.411 kg (142 lb)    History of present illness:  69 yo female admitted for chest pain. She underwent stress test , was positive for inducible ischemia. She also reported some persistent chest pressure and discomfort in her jaw on the day of the stress test, nitro patch ordered. Her coumadin was stopped and she was started on IV heparin and Cardiology consulted and scheduled  for cath when INR is less than 1.6. Cardiac catheterization showed minimal coronary artery disease with small caliber OM branch. Well preserved LVEF with normal LVEDP. Patient wanted to go home after cardiac cath, she wanted to   Hospital Course:  Unstable angina: Admitted to 2h. Stress test done abnormal with decreased activity of the inferior lateral wall . Stopped coumadin and started IV heparin and called cardiology. Cardiology recommended cardiac cath possibly when INR is less than 2, . Cardiac cath showed minimal CAD and recommended medical management. Her iNR on discharge was sub therapeutic, but pateint wanted to leave AMA if we didn't discharge her. She was discharged home to follow up with PCP in 2 days. She was also recommended to follow up with cardiology as needed.    Hypertension Controlled.    Hypothyroidism: .  Resume synthroid.   Mild anemia: Normocytic, continue to monitor.    Procedures:    Consultations:  cardiology  Discharge Exam: Filed Vitals:   09/05/14 1845  BP: 95/52  Pulse:   Temp:   Resp:     General: alert afebrile comfortable Cardiovascular: s1s2 Respiratory: ctab  Discharge Instructions   Discharge Instructions    Diet - low sodium heart healthy    Complete by:  As directed      Discharge instructions    Complete by:  As directed   Follow up with PCP in 2 to 3 days and check INR .  Follow up with [place] [facility] as needed/ as recommended.          Discharge Medication List as of 09/05/2014  7:39 PM    START taking these medications   Details  aspirin 81 MG chewable tablet Chew 1 tablet (81 mg total) by mouth daily., Starting 09/05/2014, Until Discontinued, Print    metoprolol tartrate (LOPRESSOR) 25 MG tablet Take 0.5 tablets (12.5 mg total) by mouth 2 (two) times daily with a meal., Starting 09/05/2014, Until Discontinued, Print      CONTINUE these medications which have NOT CHANGED   Details  acetaminophen (TYLENOL) 500 MG tablet Take 500 mg by mouth every 8 (eight) hours as needed for moderate pain., Until Discontinued, Historical Med    atorvastatin (LIPITOR) 10 MG tablet Take 10 mg by mouth daily at 6 PM., Until Discontinued, Historical Med    Biotin 1000 MCG tablet Take 1,000 mcg by mouth 2 (two) times daily., Until Discontinued, Historical Med    buPROPion (WELLBUTRIN SR) 200 MG 12 hr tablet Take 200 mg by mouth 2 (two) times daily.,  Until Discontinued, Historical Med    clonazePAM (KLONOPIN) 1 MG tablet Take 1 mg by mouth 2 (two) times daily., Until Discontinued, Historical Med    lamoTRIgine (LAMICTAL) 100 MG tablet Take 100 mg by mouth 2 (two) times daily., Until Discontinued, Historical Med    levothyroxine (SYNTHROID, LEVOTHROID) 50 MCG tablet Take 50 mcg by mouth daily before breakfast., Until Discontinued, Historical Med    warfarin (COUMADIN) 5 MG tablet Take 5 mg by mouth daily at 6 PM. , Until  Discontinued, Historical Med       Allergies  Allergen Reactions  . Codeine Nausea And Vomiting   Follow-up Information    Please follow up.   Why:  Please follow up with your regular doctor as soon as possible.      Follow up with Eustaquio Boyden, MD. Schedule an appointment as soon as possible for a visit in 1 week.   Specialty:  Family Medicine   Contact information:   [address] [place] [address] [phone]       Follow up with [facility].   Specialty:  Cardiology   Why:  As needed   Contact information:   [address] [place], [place] [address] [place] [address] [phone]       The results of significant diagnostics from this hospitalization (including imaging, microbiology, ancillary and laboratory) are listed below for reference.    Significant Diagnostic Studies: Dg Chest 2 View  09/01/2014   CLINICAL DATA:  Midchest pain radiating into throat.  EXAM: CHEST  2 VIEW  COMPARISON:  None.  FINDINGS: Mild linear right base opacities may be scarring or atelectasis. There is no confluent airspace opacity. Emphysematous changes are present in the upper lobes. Hilar, mediastinal and cardiac contours appear unremarkable. There are no pleural effusions.  IMPRESSION: Mild linear right base opacity due to scarring or atelectasis.   Electronically Signed   By: Ellery Plunk M.D.   On: 09/01/2014 21:11   Nm Myocar Multi W/spect W/wall Motion / Ef  09/02/2014   CLINICAL DATA:  Acute chest pain  EXAM: MYOCARDIAL IMAGING WITH SPECT (REST AND PHARMACOLOGIC-STRESS)  GATED LEFT VENTRICULAR WALL MOTION STUDY  LEFT VENTRICULAR EJECTION FRACTION  TECHNIQUE: Standard myocardial SPECT imaging was performed after resting intravenous injection of 10 mCi Tc-11m sestamibi. Subsequently, intravenous infusion of Lexiscan was performed under the supervision of the Cardiology staff. At peak effect of the drug, 30 mCi Tc-33m sestamibi was injected intravenously and  standard myocardial SPECT imaging was performed. Quantitative gated imaging was also performed to evaluate left ventricular wall motion, and estimate left ventricular ejection fraction.  COMPARISON:  None.  FINDINGS: Perfusion: Decreased activity of the inferior lateral wall extending into the apex on the stress views when compared to the rest, compatible with inducible ischemia with pharmacologic stress.  Wall Motion: Normal left ventricular wall motion. No left ventricular dilation.  Left Ventricular Ejection Fraction: 87 %  End diastolic volume 47 ml  End systolic volume 6 ml  IMPRESSION: 1. Positive exam for inferior lateral wall inducible ischemia extending into the apex with pharmacologic stress.  2. Normal left ventricular wall motion.  3. Left ventricular ejection fraction 87%  4. Intermediate-risk stress test findings*.  *2012 Appropriate Use Criteria for Coronary Revascularization Focused Update: J Am Coll Cardiol. 2012;59(9):857-881. http://content.dementiazones.com.aspx?articleid=1201161   Electronically Signed   By: Judie Petit.  Shick M.D.   On: 09/02/2014 13:36    Microbiology: Recent Results (from the past 240 hour(s))  MRSA PCR Screening  Status: None   Collection Time: 09/02/14  2:02 AM  Result Value Ref Range Status   MRSA by PCR NEGATIVE NEGATIVE Final    Comment:        The GeneXpert MRSA Assay (FDA approved for NASAL specimens only), is one component of a comprehensive MRSA colonization surveillance program. It is not intended to diagnose MRSA infection nor to guide or monitor treatment for MRSA infections.      Labs: Basic Metabolic Panel:  Recent Labs Lab 09/01/14 1853 09/02/14 0751 09/04/14 0333  NA 138 135 138  K 4.6 4.1 3.7  CL 104 103 110  CO2 GLUCOSE 94 97 91  BUN CREATININE 0.77 0.75 0.72  CALCIUM 9.8 9.2 8.4   Liver Function Tests:  Recent Labs Lab 09/02/14 0751  AST 23  ALT 13  ALKPHOS 57  BILITOT 0.8  PROT 6.1   ALBUMIN 3.5   No results for input(s): LIPASE, AMYLASE in the last 168 hours. No results for input(s): AMMONIA in the last 168 hours. CBC:  Recent Labs Lab 09/01/14 1853 09/02/14 0751 09/03/14 0318 09/04/14 0333 09/05/14 0327  WBC 7.3 6.4 6.8 5.5 5.8  HGB 14.0 13.3 12.8 11.7* 12.1  HCT 42.1 40.7 39.3 35.5* 36.7  MCV 98.4 96.4 95.4 95.7 97.3  PLT 147* 136* 149* 145* 137*   Cardiac Enzymes:  Recent Labs Lab 09/02/14 0312 09/02/14 0751 09/02/14 1440  TROPONINI <0.03 <0.03 <0.03   BNP: BNP (last 3 results) No results for input(s): BNP in the last 8760 hours.  ProBNP (last 3 results) No results for input(s): PROBNP in the last 8760 hours.  CBG: No results for input(s): GLUCAP in the last 168 hours.     SignedKathlen Mody  [facility] 09/06/2014, 11:20 PM

## 2014-09-07 ENCOUNTER — Telehealth: Payer: Self-pay | Admitting: Family Medicine

## 2014-09-07 NOTE — Telephone Encounter (Signed)
Pt discharged yesterday after chest pain admission with reassuring cath.  plz call today or tomorrow for f/u phone call and update on condition and schedule office visit next week.

## 2014-09-07 NOTE — Telephone Encounter (Signed)
Spoke with patient. She said she is doing fairly well. She is just tired. She already has follow up scheduled for tomorrow. She wants to have PT/INR checked at tomorrow's appt instead of waiting until Monday if possible.

## 2014-09-08 ENCOUNTER — Ambulatory Visit (INDEPENDENT_AMBULATORY_CARE_PROVIDER_SITE_OTHER): Payer: Commercial Managed Care - HMO | Admitting: Family Medicine

## 2014-09-08 ENCOUNTER — Encounter: Payer: Self-pay | Admitting: Family Medicine

## 2014-09-08 VITALS — BP 136/76 | HR 68 | Temp 98.2°F | Wt 141.2 lb

## 2014-09-08 DIAGNOSIS — F3160 Bipolar disorder, current episode mixed, unspecified: Secondary | ICD-10-CM | POA: Diagnosis not present

## 2014-09-08 DIAGNOSIS — Z5181 Encounter for therapeutic drug level monitoring: Secondary | ICD-10-CM

## 2014-09-08 DIAGNOSIS — E785 Hyperlipidemia, unspecified: Secondary | ICD-10-CM

## 2014-09-08 DIAGNOSIS — E039 Hypothyroidism, unspecified: Secondary | ICD-10-CM

## 2014-09-08 DIAGNOSIS — Z86718 Personal history of other venous thrombosis and embolism: Secondary | ICD-10-CM | POA: Diagnosis not present

## 2014-09-08 DIAGNOSIS — R110 Nausea: Secondary | ICD-10-CM

## 2014-09-08 DIAGNOSIS — I208 Other forms of angina pectoris: Secondary | ICD-10-CM

## 2014-09-08 LAB — POCT INR: INR: 1.4

## 2014-09-08 NOTE — Patient Instructions (Addendum)
Try gasX first thing when you wake up.  Let me know if any recurrent chest pain episodes Continue metoprolol 1/2 tablet twice daily (12.5mg ) Coumadin check today (it was 1.3 on 09/05/2014).  Good to see you today, call us with questions.

## 2014-09-08 NOTE — Progress Notes (Signed)
Pre visit review using our clinic review tool, if applicable. No additional management support is needed unless otherwise documented below in the visit note. 

## 2014-09-08 NOTE — Progress Notes (Signed)
BP 136/76 mmHg  Pulse 68  Temp(Src) 98.2 F (36.8 C) (Oral)  Wt 141 lb 4 oz (64.071 kg)  SpO2 97%   CC: hosp f/u visit  Subjective:    Patient ID: Kathryn Mathews, female    DOB: 06/21/1946, 69 y.o.   MRN: [id number]  HPI: Kathryn Mathews is a 69 y.o. female presenting on 09/08/2014 for Hospitalization Follow-up   Kathryn Mathews is here today for hospital follow up visit after hospitalization last week at [facility] with chest pain. Concern for stable angina. Treated with IV heparin drip. Stress test returned concerning for inducible reversible ischemia so she underwent cardiac catheterization which revealed minimal coronary artery disease with small caliber OM branch that could have caused sxs but was not amenable to PCI, as well as well preserved LVEF with normal LVEDP. 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 Cardiology f/u was left open ended.  Since home denies chest pain. Overall feeling well. Requests INR checked today.  Endorses longstanding persistent nausea first thing in the morning right when she wakes up that lasts 15 min. Associated with gassiness as well. Also feels hot flash. No fevers/chills, vomiting or diarrhea, abd pain. No weight changes that she's noted. Lab Results  Component Value Date   TSH 1.47 09/02/2014   Admit date: 09/01/2014 Discharge date: 09/05/2014 F/u phone call: completed 09/07/2014  Recommendations for Outpatient Follow-up:  1. Follow up with PCP in 1 to 2 days. Discharge Diagnoses:  Principal Problem:  Unstable angina Active Problems:  Essential hypertension  Hyperlipidemia  Abnormal nuclear stress test  Shortness of breath  Discharge Condition: improved Diet recommendation: low sodium diet.   Relevant past medical, surgical, family and social history reviewed and updated as indicated. Interim medical history since our last visit reviewed. Allergies and  medications reviewed and updated. Current Outpatient Prescriptions on File Prior to Visit  Medication Sig  . acetaminophen (TYLENOL) 500 MG tablet Take 1,000 mg by mouth 2 (two) times daily.  Marland Kitchen atorvastatin (LIPITOR) 10 MG tablet Take 1 tablet (10 mg total) by mouth at bedtime.  . Biotin 1000 MCG tablet Take 1,000 mcg by mouth 2 (two) times daily.    Marland Kitchen buPROPion (WELLBUTRIN SR) 150 MG 12 hr tablet Take 1 tablet (150 mg total) by mouth 2 (two) times daily.  . Calcium Carb-Cholecalciferol (CALCIUM-VITAMIN D) 600-400 MG-UNIT TABS Take 1 tablet by mouth daily.  . Cholecalciferol (VITAMIN D) 2000 UNITS CAPS Take 1 capsule (2,000 Units total) by mouth daily.  . clotrimazole-betamethasone (LOTRISONE) cream Apply 1 application topically 2 (two) times daily.  Marland Kitchen docusate sodium (COLACE) 100 MG capsule Take 100 mg by mouth 2 (two) times daily as needed for constipation.  Marland Kitchen doxepin (SINEQUAN) 50 MG capsule Take 1 capsule (50 mg total) by mouth at bedtime.  . lamoTRIgine (LAMICTAL) 25 MG tablet Take 2 tablets (50 mg total) by mouth 2 (two) times daily.  Marland Kitchen levothyroxine (SYNTHROID, LEVOTHROID) 50 MCG tablet TAKE 1 TABLET (50 MCG TOTAL) BY MOUTH DAILY.  Marland Kitchen polyethylene glycol powder (GLYCOLAX/MIRALAX) powder Take 17 g by mouth 2 (two) times daily as needed.  . warfarin (COUMADIN) 5 MG tablet Take daily as directed by [facility].   No current facility-administered medications on file prior to visit.    Review of Systems Per HPI unless specifically indicated above     Objective:    BP 136/76 mmHg  Pulse 68  Temp(Src)  98.2 F (36.8 C) (Oral)  Wt 141 lb 4 oz (64.071 kg)  SpO2 97%  Wt Readings from Last 3 Encounters:  09/08/14 141 lb 4 oz (64.071 kg)  05/29/14 140 lb 8 oz (63.73 kg)  05/23/14 140 lb 12 oz (63.844 kg)    Physical Exam  Constitutional: She appears well-developed and well-nourished. No distress.  HENT:  Mouth/Throat: Oropharynx is clear and moist. No oropharyngeal exudate.    Cardiovascular: Normal rate and intact distal pulses.   Murmur (3/6 SEM best at LUSB) heard. Pulmonary/Chest: Effort normal and breath sounds normal. No respiratory distress. She has no wheezes. She has no rales.  Musculoskeletal: She exhibits no edema.  Skin: Skin is warm and dry.  significant ecchymoses bilateral forearms  Nursing note and vitals reviewed.  Results for orders placed or performed in visit on 09/08/14  Basic metabolic panel  Result Value Ref Range   Potassium 3.7 3.4 - 5.3 mmol/L   Sodium 138 137 - 147 mmol/L  Basic metabolic panel  Result Value Ref Range   Glucose 91 mg/dL   Creatinine 0.7 .5 - 1.1 mg/dL  Lipid panel  Result Value Ref Range   Triglycerides 138 40 - 160 mg/dL   Cholesterol 284153 0 - 132200 mg/dL   LDL Cholesterol 84 mg/dL  CBC and differential  Result Value Ref Range   Hemoglobin 12.1 12.0 - 16.0 g/dL  Lipid panel  Result Value Ref Range   HDL 41 35 - 70 mg/dL  TSH  Result Value Ref Range   TSH 1.47 .41 - 5.90 uIU/mL  POCT INR  Result Value Ref Range   INR 1.4       Assessment & Plan:  Pt hospitalization records under different MRN - labs update in chart.  Problem List Items Addressed This Visit    Hypothyroid    TSH stable on recent check.      HLD (hyperlipidemia)    Continue lipitor 10mg  daily. Lab Results  Component Value Date   LDLCALC 84 09/02/2014        Relevant Medications   aspirin EC 81 MG tablet   Encounter for therapeutic drug monitoring    Check INR today, dose accordingly      Chronic nausea    Chronic issue, associated with AM gassiness. rec take gas X first thing in the morning to see if any improvement. Recent labwork all stable. No unexpected weight loss noted.      Bipolar disorder    Stable on current regimen. Continue f/u with Dr Toni Amendlapacs      Anginal chest pain at rest - Primary    Chest pain resolved during hospitalization, denies further trouble. Small branch of OM thought to be possible culprit  of sxs, but not amenable to intervention. Will continue working towards minimizing cardiovascular risks, continue tight bp, glycemic and lipid control. Remains asxs. Consider prescribing SL nitro vs imdur if recurrent pain.      Relevant Medications   aspirin EC 81 MG tablet    Other Visit Diagnoses    Personal history of venous thrombosis and embolism        Relevant Orders    POCT INR        Follow up plan: Return if symptoms worsen or fail to improve.

## 2014-09-09 ENCOUNTER — Encounter: Payer: Self-pay | Admitting: Family Medicine

## 2014-09-09 DIAGNOSIS — I208 Other forms of angina pectoris: Secondary | ICD-10-CM | POA: Insufficient documentation

## 2014-09-09 DIAGNOSIS — R110 Nausea: Secondary | ICD-10-CM | POA: Insufficient documentation

## 2014-09-09 NOTE — Assessment & Plan Note (Signed)
Continue lipitor 10mg  daily. Lab Results  Component Value Date   LDLCALC 84 09/02/2014

## 2014-09-09 NOTE — Assessment & Plan Note (Addendum)
Chest pain resolved during hospitalization, denies further trouble. Small branch of OM thought to be possible culprit of sxs, but not amenable to intervention. Will continue working towards minimizing cardiovascular risks, continue tight bp, glycemic and lipid control. Remains asxs. Consider prescribing SL nitro vs imdur if recurrent pain.

## 2014-09-09 NOTE — Assessment & Plan Note (Signed)
TSH stable on recent check. 

## 2014-09-09 NOTE — Assessment & Plan Note (Signed)
Chronic issue, associated with AM gassiness. rec take gas X first thing in the morning to see if any improvement. Recent labwork all stable. No unexpected weight loss noted.

## 2014-09-09 NOTE — Assessment & Plan Note (Signed)
Check INR today, dose accordingly. 

## 2014-09-09 NOTE — Assessment & Plan Note (Signed)
Stable on current regimen. Continue f/u with Dr Toni Amendlapacs

## 2014-09-11 ENCOUNTER — Other Ambulatory Visit: Payer: Commercial Managed Care - HMO

## 2014-09-14 ENCOUNTER — Other Ambulatory Visit: Payer: Self-pay | Admitting: Family Medicine

## 2014-09-14 ENCOUNTER — Other Ambulatory Visit (INDEPENDENT_AMBULATORY_CARE_PROVIDER_SITE_OTHER): Payer: Commercial Managed Care - HMO

## 2014-09-14 DIAGNOSIS — Z86718 Personal history of other venous thrombosis and embolism: Secondary | ICD-10-CM | POA: Diagnosis not present

## 2014-09-14 LAB — POCT INR: INR: 1.5

## 2014-09-14 MED ORDER — WARFARIN SODIUM 2.5 MG PO TABS: 2.5000 mg | ORAL_TABLET | Freq: Every day | ORAL | Status: DC

## 2014-09-19 DIAGNOSIS — F339 Major depressive disorder, recurrent, unspecified: Secondary | ICD-10-CM | POA: Diagnosis not present

## 2014-09-21 DIAGNOSIS — F339 Major depressive disorder, recurrent, unspecified: Secondary | ICD-10-CM | POA: Diagnosis not present

## 2014-09-28 ENCOUNTER — Other Ambulatory Visit (INDEPENDENT_AMBULATORY_CARE_PROVIDER_SITE_OTHER): Payer: Commercial Managed Care - HMO

## 2014-09-28 DIAGNOSIS — Z86718 Personal history of other venous thrombosis and embolism: Secondary | ICD-10-CM | POA: Diagnosis not present

## 2014-09-28 LAB — POCT INR: INR: 2.2

## 2014-10-05 DIAGNOSIS — F339 Major depressive disorder, recurrent, unspecified: Secondary | ICD-10-CM | POA: Diagnosis not present

## 2014-10-19 ENCOUNTER — Other Ambulatory Visit (INDEPENDENT_AMBULATORY_CARE_PROVIDER_SITE_OTHER): Payer: Commercial Managed Care - HMO

## 2014-10-19 ENCOUNTER — Other Ambulatory Visit: Payer: Commercial Managed Care - HMO

## 2014-10-19 DIAGNOSIS — Z86718 Personal history of other venous thrombosis and embolism: Secondary | ICD-10-CM | POA: Diagnosis not present

## 2014-10-19 LAB — POCT INR: INR: 2.1

## 2014-10-20 DIAGNOSIS — F339 Major depressive disorder, recurrent, unspecified: Secondary | ICD-10-CM | POA: Diagnosis not present

## 2014-10-24 DIAGNOSIS — F339 Major depressive disorder, recurrent, unspecified: Secondary | ICD-10-CM | POA: Diagnosis not present

## 2014-10-26 ENCOUNTER — Telehealth: Payer: Self-pay | Admitting: Family Medicine

## 2014-10-26 NOTE — Telephone Encounter (Signed)
Pt's [facility] came in [facility] and [facility].  Called [facility] to set up referrals they will contact pt.  Because [facility] does not provide for retroactive referrals, we are unable to complete for any visits prior to today.  / lt

## 2014-11-07 DIAGNOSIS — F339 Major depressive disorder, recurrent, unspecified: Secondary | ICD-10-CM | POA: Diagnosis not present

## 2014-11-13 ENCOUNTER — Other Ambulatory Visit: Payer: Self-pay | Admitting: *Deleted

## 2014-11-13 MED ORDER — ATORVASTATIN CALCIUM 10 MG PO TABS: 10.0000 mg | ORAL_TABLET | Freq: Every day | ORAL | Status: DC

## 2014-11-13 MED ORDER — LEVOTHYROXINE SODIUM 50 MCG PO TABS: ORAL_TABLET | ORAL | Status: DC

## 2014-11-13 NOTE — Telephone Encounter (Signed)
Patient came by the office and requested refills on Atorvastatin and Levothyroxine. [facility].  Patient also requested that [facility]. Is it okay to refill Warfarin? Last PT/INR  10/19/14.

## 2014-11-15 MED ORDER — WARFARIN SODIUM 2.5 MG PO TABS: 2.5000 mg | ORAL_TABLET | ORAL | Status: DC

## 2014-11-15 MED ORDER — WARFARIN SODIUM 5 MG PO TABS: ORAL_TABLET | ORAL | Status: DC

## 2014-11-15 NOTE — Telephone Encounter (Signed)
Warfarin sent in.

## 2014-11-16 ENCOUNTER — Other Ambulatory Visit (INDEPENDENT_AMBULATORY_CARE_PROVIDER_SITE_OTHER): Payer: Commercial Managed Care - HMO

## 2014-11-16 ENCOUNTER — Encounter: Payer: Self-pay | Admitting: *Deleted

## 2014-11-16 DIAGNOSIS — Z86718 Personal history of other venous thrombosis and embolism: Secondary | ICD-10-CM

## 2014-11-16 DIAGNOSIS — Z79899 Other long term (current) drug therapy: Secondary | ICD-10-CM | POA: Diagnosis not present

## 2014-11-16 DIAGNOSIS — Z5181 Encounter for therapeutic drug level monitoring: Secondary | ICD-10-CM

## 2014-11-16 LAB — POCT INR: INR: 2

## 2014-11-17 ENCOUNTER — Ambulatory Visit (INDEPENDENT_AMBULATORY_CARE_PROVIDER_SITE_OTHER): Payer: Commercial Managed Care - HMO | Admitting: Family Medicine

## 2014-11-17 ENCOUNTER — Encounter: Payer: Self-pay | Admitting: Family Medicine

## 2014-11-17 VITALS — BP 140/82 | HR 72 | Temp 98.8°F | Wt 142.5 lb

## 2014-11-17 DIAGNOSIS — R2689 Other abnormalities of gait and mobility: Secondary | ICD-10-CM

## 2014-11-17 DIAGNOSIS — Z1239 Encounter for other screening for malignant neoplasm of breast: Secondary | ICD-10-CM | POA: Diagnosis not present

## 2014-11-17 LAB — COMPREHENSIVE METABOLIC PANEL
ALBUMIN: 4.2 g/dL (ref 3.5–5.2)
ALK PHOS: 65 U/L (ref 39–117)
ALT: 12 U/L (ref 0–35)
AST: 22 U/L (ref 0–37)
BUN: 13 mg/dL (ref 6–23)
CO2: 31 mEq/L (ref 19–32)
CREATININE: 0.82 mg/dL (ref 0.40–1.20)
Calcium: 9.9 mg/dL (ref 8.4–10.5)
Chloride: 103 mEq/L (ref 96–112)
GFR: 73.45 mL/min (ref >60.00)
Glucose, Bld: 92 mg/dL (ref 70–99)
POTASSIUM: 4.8 meq/L (ref 3.5–5.1)
SODIUM: 138 meq/L (ref 135–145)
Total Bilirubin: 0.4 mg/dL (ref 0.2–1.2)
Total Protein: 7.3 g/dL (ref 6.0–8.3)

## 2014-11-17 LAB — CBC WITH DIFFERENTIAL/PLATELET
BASOS PCT: 0.3 % (ref 0.0–3.0)
Basophils Absolute: 0 10*3/uL (ref 0.0–0.1)
Eosinophils Absolute: 0.1 10*3/uL (ref 0.0–0.7)
Eosinophils Relative: 2.4 % (ref 0.0–5.0)
HCT: 42.1 % (ref 36.0–46.0)
Hemoglobin: 14.3 g/dL (ref 12.0–15.0)
Lymphocytes Relative: 23.9 % (ref 12.0–46.0)
Lymphs Abs: 1.3 10*3/uL (ref 0.7–4.0)
MCHC: 33.9 g/dL (ref 30.0–36.0)
MCV: 93.2 fl (ref 78.0–100.0)
MONO ABS: 0.3 10*3/uL (ref 0.1–1.0)
Monocytes Relative: 4.8 % (ref 3.0–12.0)
Neutro Abs: 3.8 10*3/uL (ref 1.4–7.7)
Neutrophils Relative %: 68.6 % (ref 43.0–77.0)
Platelets: 203 10*3/uL (ref 150.0–400.0)
RBC: 4.51 Mil/uL (ref 3.87–5.11)
RDW: 13.6 % (ref 11.5–15.5)
WBC: 5.5 10*3/uL (ref 4.0–10.5)

## 2014-11-17 LAB — TSH: TSH: 1.78 u[IU]/mL (ref 0.35–4.50)

## 2014-11-17 NOTE — Assessment & Plan Note (Addendum)
Describes episodes of sudden onset ataxia that last several seconds. ?med related - decrease lamictal to 25mg  bid (ataxia listed as side effect). Check head CT. Check labs today. Will ask for Dr Toni Amendlapacs input as well.

## 2014-11-17 NOTE — Patient Instructions (Addendum)
Pass by Marion's office to schedule CT scan of head for imbalance issues and decrease lamictal to 25mg  twice daily while you get in to see Dr Toni Amendlapacs to see if any improvement in imbalance.  Continue other medicines as up to now.  If persistent imbalance and Dr Toni Amendlapacs doesn't think this is medicine related we may refer you to neurologist.  Use cane to walk - extra support

## 2014-11-17 NOTE — Progress Notes (Signed)
Pre visit review using our clinic review tool, if applicable. No additional management support is needed unless otherwise documented below in the visit note. 

## 2014-11-17 NOTE — Progress Notes (Signed)
BP 140/82 mmHg  Pulse 72  Temp(Src) 98.8 F (37.1 C) (Oral)  Wt 142 lb 8 oz (64.638 kg)   CC: imbalance "my balance is awful" Subjective:    Patient ID: Kathryn Mathews, female    DOB: 30-Sep-1945, 69 y.o.   MRN: [id number]  HPI: Kathryn Mathews is a 69 y.o. female presenting on 11/17/2014 for Balance issues   Ongoing issue with imbalance over >1 year leading to multiple falls. Denies dizziness, vertigo, presyncope or syncope. No palpitations, headaches. No leg pains or weakness. No urinary incontinence. No significant memory trouble noted recently.   Endorses mild memory troubles. Husband drives her. Pt has stopped driving.   Reviewed meds - she's on klonopin 0.5mg  bid and lamictal 50mg  bid, both prescribed by Dr Toni Amendlapacs. Reviewing med list both of these medications can cause falls or ataxia.   Falls in the past led to stopping hydrocodone use.  For imbalance - sent 2013 to HHPT, refused outpatient PT. Physical therapy had recommended HEP and cane use - she did not do this.  H/o bilateral humeral fractures, osteopenia, on coumadin all making her at high risk for complications from fall.  ?drop attacks.   Relevant past medical, surgical, family and social history reviewed and updated as indicated. Interim medical history since our last visit reviewed. Allergies and medications reviewed and updated. Current Outpatient Prescriptions on File Prior to Visit  Medication Sig  . acetaminophen (TYLENOL) 500 MG tablet Take 1,000 mg by mouth 2 (two) times daily.  Marland Kitchen. aspirin EC 81 MG tablet Take 81 mg by mouth daily.  Marland Kitchen. atorvastatin (LIPITOR) 10 MG tablet Take 1 tablet (10 mg total) by mouth at bedtime.  . Biotin 1000 MCG tablet Take 1,000 mcg by mouth 2 (two) times daily.    Marland Kitchen. buPROPion (WELLBUTRIN SR) 150 MG 12 hr tablet Take 1 tablet (150 mg total) by mouth 2 (two) times daily.  . Calcium Carb-Cholecalciferol (CALCIUM-VITAMIN D) 600-400 MG-UNIT TABS Take 1 tablet by mouth daily.  .  Cholecalciferol (VITAMIN D) 2000 UNITS CAPS Take 1 capsule (2,000 Units total) by mouth daily.  . clonazePAM (KLONOPIN) 0.5 MG tablet Take 1 tablet (0.5 mg total) by mouth 2 (two) times daily.  . clotrimazole-betamethasone (LOTRISONE) cream Apply 1 application topically 2 (two) times daily.  Marland Kitchen. docusate sodium (COLACE) 100 MG capsule Take 100 mg by mouth 2 (two) times daily as needed for constipation.  Marland Kitchen. lamoTRIgine (LAMICTAL) 25 MG tablet Take 2 tablets (50 mg total) by mouth 2 (two) times daily.  Marland Kitchen. levothyroxine (SYNTHROID, LEVOTHROID) 50 MCG tablet TAKE 1 TABLET (50 MCG TOTAL) BY MOUTH DAILY.  Marland Kitchen. polyethylene glycol powder (GLYCOLAX/MIRALAX) powder Take 17 g by mouth 2 (two) times daily as needed.  . warfarin (COUMADIN) 2.5 MG tablet Take 1 tablet (2.5 mg total) by mouth every Monday, Wednesday, and Friday.  . warfarin (COUMADIN) 5 MG tablet Take daily as directed by [facility].   No current facility-administered medications on file prior to visit.    Review of Systems Per HPI unless specifically indicated above     Objective:    BP 140/82 mmHg  Pulse 72  Temp(Src) 98.8 F (37.1 C) (Oral)  Wt 142 lb 8 oz (64.638 kg)  Wt Readings from Last 3 Encounters:  11/17/14 142 lb 8 oz (64.638 kg)  09/08/14 141 lb 4 oz (64.071 kg)  05/29/14 140 lb 8 oz (63.73 kg)    Physical Exam  Constitutional: She appears well-developed and well-nourished. No distress.  HENT:  Mouth/Throat: Oropharynx is clear and moist. No oropharyngeal exudate.  Eyes: Conjunctivae and EOM are normal. Pupils are equal, round, and reactive to light. No scleral icterus.  Neck: Normal range of motion. Neck supple.  Cardiovascular: Normal rate, regular rhythm and intact distal pulses.   Murmur (3/6 SEM LUSB) heard. Pulmonary/Chest: Effort normal and breath sounds normal. No respiratory distress. She has no wheezes. She has no rales.  Musculoskeletal: She exhibits no edema.  Lymphadenopathy:    She has no cervical  adenopathy.  Neurological: She has normal strength. No cranial nerve deficit or sensory deficit. She displays a negative Romberg sign. Coordination and gait abnormal.  5/5 strength BU and LE CN2-12 intact Intact FTN Episode of marked ataxia when coming off exam table Somewhat wide based gait with unsteadiness noted.   Skin: Skin is warm and dry. No rash noted.  Psychiatric: She has a normal mood and affect.  Nursing note and vitals reviewed.  Results for orders placed or performed in visit on 11/16/14  POCT INR  Result Value Ref Range   INR 2.0       Assessment & Plan:   Problem List Items Addressed This Visit    Imbalance - Primary    Describes episodes of sudden onset ataxia that last several seconds. ?med related - decrease lamictal to 25mg  bid (ataxia listed as side effect). Check head CT. Check labs today. Will ask for Dr Toni Amendlapacs input as well.      Relevant Orders   Comprehensive metabolic panel   TSH   CBC with Differential/Platelet   CT Head Wo Contrast    Other Visit Diagnoses    Encounter for breast cancer screening other than mammogram        Relevant Orders    MM DIGITAL SCREENING BILATERAL        Follow up plan: Return if symptoms worsen or fail to improve.

## 2014-11-23 DIAGNOSIS — F339 Major depressive disorder, recurrent, unspecified: Secondary | ICD-10-CM | POA: Diagnosis not present

## 2014-11-30 ENCOUNTER — Telehealth: Payer: Self-pay | Admitting: Family Medicine

## 2014-11-30 ENCOUNTER — Ambulatory Visit
Admission: RE | Admit: 2014-11-30 | Discharge: 2014-11-30 | Disposition: A | Discharge status: Home / Self Care | Payer: Commercial Managed Care - HMO | Source: Ambulatory Visit | Attending: Family Medicine | Admitting: Family Medicine

## 2014-11-30 DIAGNOSIS — R2689 Other abnormalities of gait and mobility: Secondary | ICD-10-CM | POA: Diagnosis not present

## 2014-11-30 DIAGNOSIS — R2681 Unsteadiness on feet: Secondary | ICD-10-CM | POA: Diagnosis not present

## 2014-11-30 DIAGNOSIS — G319 Degenerative disease of nervous system, unspecified: Secondary | ICD-10-CM | POA: Insufficient documentation

## 2014-11-30 DIAGNOSIS — R270 Ataxia, unspecified: Secondary | ICD-10-CM | POA: Diagnosis not present

## 2014-11-30 NOTE — Telephone Encounter (Signed)
Spoke with patient and she said that psych had her on a taper. She is no longer on this med since she finished it today actually. Did not add to med list.

## 2014-11-30 NOTE — Telephone Encounter (Signed)
Plz notify urine test returned positive for prozac - has psych placed her on this med and if so what dose? plz update her med list.

## 2014-12-07 ENCOUNTER — Encounter: Payer: Self-pay | Admitting: Family Medicine

## 2014-12-07 ENCOUNTER — Other Ambulatory Visit (INDEPENDENT_AMBULATORY_CARE_PROVIDER_SITE_OTHER): Payer: Commercial Managed Care - HMO

## 2014-12-07 ENCOUNTER — Ambulatory Visit (INDEPENDENT_AMBULATORY_CARE_PROVIDER_SITE_OTHER): Payer: Commercial Managed Care - HMO | Admitting: Family Medicine

## 2014-12-07 VITALS — BP 112/78 | HR 68 | Temp 98.7°F | Ht <= 58 in | Wt 143.8 lb

## 2014-12-07 DIAGNOSIS — Z5181 Encounter for therapeutic drug level monitoring: Secondary | ICD-10-CM | POA: Diagnosis not present

## 2014-12-07 DIAGNOSIS — R2689 Other abnormalities of gait and mobility: Secondary | ICD-10-CM | POA: Diagnosis not present

## 2014-12-07 DIAGNOSIS — Z86718 Personal history of other venous thrombosis and embolism: Secondary | ICD-10-CM

## 2014-12-07 LAB — POCT INR: INR: 1.9

## 2014-12-07 NOTE — Assessment & Plan Note (Signed)
INR today. Lab Results  Component Value Date   INR 2.0 11/16/2014   INR 2.1 10/19/2014   INR 2.2 09/28/2014

## 2014-12-07 NOTE — Progress Notes (Signed)
Pre visit review using our clinic review tool, if applicable. No additional management support is needed unless otherwise documented below in the visit note. 

## 2014-12-07 NOTE — Progress Notes (Signed)
BP 112/78 mmHg  Pulse 68  Temp(Src) 98.7 F (37.1 C) (Oral)  Ht 4' 8.75" (1.441 m)  Wt 143 lb 12 oz (65.205 kg)  BMI 31.40 kg/m2  SpO2 98%   CC: f/u visit  Subjective:    Patient ID: Kathryn Mathews, female    DOB: [birth year], 69 y.o.   MRN: [id number]  HPI: WINDSOR ZIRKELBACH is a 69 y.o. female presenting on 12/07/2014 for Follow-up   See prior note for details. Briefly, seen here 3 wks ago with chronic and worsening imbalance - thought med related vs central cause, workup included stable head CT and labwork. We decreased lamictal to  bid and I asked her to check with Dr Toni Amend for his input on etiology. She did not check.  Actually over last 2 days no more imbalance, dizziness, near falls or falls.  Pt had told me she was taking lamictal  2 tablets twice daily at last visit, so we decreased to  bid. Today husband endorses prior dose was  bid.   Relevant past medical, surgical, family and social history reviewed and updated as indicated. Interim medical history since our last visit reviewed. Allergies and medications reviewed and updated. Current Outpatient Prescriptions on File Prior to Visit  Medication Sig  . acetaminophen (TYLENOL) 500 MG tablet Take 500 mg by mouth every 8 (eight) hours as needed for moderate pain.  Marland Kitchen aspirin 81 MG chewable tablet Chew 1 tablet (81 mg total) by mouth daily.  Marland Kitchen aspirin EC 81 MG tablet Take 81 mg by mouth daily.  Marland Kitchen atorvastatin (LIPITOR) 10 MG tablet Take 1 tablet (10 mg total) by mouth at bedtime.  . Biotin 1000 MCG tablet Take 1,000 mcg by mouth 2 (two) times daily.    Marland Kitchen buPROPion (WELLBUTRIN SR) 200 MG 12 hr tablet Take 200 mg by mouth 2 (two) times daily.  . Calcium Carb-Cholecalciferol (CALCIUM-VITAMIN D) 600-400 MG-UNIT TABS Take 1 tablet by mouth daily.  . Cholecalciferol (VITAMIN D) 2000 UNITS CAPS Take 1 capsule (2,000 Units total) by mouth daily.  . clonazePAM (KLONOPIN) 1 MG tablet Take 1 mg by mouth 2 (two) times  daily.  . clotrimazole-betamethasone (LOTRISONE) cream Apply 1 application topically 2 (two) times daily.  Marland Kitchen docusate sodium (COLACE) 100 MG capsule Take 100 mg by mouth 2 (two) times daily as needed for constipation.  Marland Kitchen levothyroxine (SYNTHROID, LEVOTHROID) 50 MCG tablet TAKE 1 TABLET (50 MCG TOTAL) BY MOUTH DAILY.  . metoprolol tartrate (LOPRESSOR) 25 MG tablet Take 0.5 tablets (12.5 mg total) by mouth 2 (two) times daily with a meal.  . polyethylene glycol powder (GLYCOLAX/MIRALAX) powder Take 17 g by mouth 2 (two) times daily as needed.  . warfarin (COUMADIN) 2.5 MG tablet Take 1 tablet (2.5 mg total) by mouth every Monday, Wednesday, and Friday.  . warfarin (COUMADIN) 5 MG tablet Take daily as directed by [facility].   No current facility-administered medications on file prior to visit.    Review of Systems Per HPI unless specifically indicated above     Objective:    BP 112/78 mmHg  Pulse 68  Temp(Src) 98.7 F (37.1 C) (Oral)  Ht 4' 8.75" (1.441 m)  Wt 143 lb 12 oz (65.205 kg)  BMI 31.40 kg/m2  SpO2 98%  Wt Readings from Last 3 Encounters:  12/07/14 143 lb 12 oz (65.205 kg)  11/17/14 142 lb 8 oz (64.638 kg)  09/08/14 141 lb 4 oz (64.071 kg)    Physical Exam  Constitutional: She  is oriented to person, place, and time. She appears well-developed and well-nourished. No distress.  Neurological: She is alert and oriented to person, place, and time. She displays a negative Romberg sign. Coordination and gait normal.  Nursing note and vitals reviewed.  Results for orders placed or performed in visit on 11/17/14  Comprehensive metabolic panel  Result Value Ref Range   Sodium 138 135 - 145 mEq/L   Potassium 4.8 3.5 - 5.1 mEq/L   Chloride 103 96 - 112 mEq/L   CO2 31 19 - 32 mEq/L   Glucose, Bld 92 70 - 99 mg/dL   BUN 13 6 - 23 mg/dL   Creatinine, Ser 1.020.82 0.40 - 1.20 mg/dL   Total Bilirubin 0.4 0.2 - 1.2 mg/dL   Alkaline Phosphatase 65 39 - 117 U/L   AST 22 0 - 37 U/L    ALT 12 0 - 35 U/L   Total Protein 7.3 6.0 - 8.3 g/dL   Albumin 4.2 3.5 - 5.2 g/dL   Calcium 9.9 8.4 - 72.510.5 mg/dL   GFR 36.6473.45 >40.34>60.00 mL/min  TSH  Result Value Ref Range   TSH 1.78 0.35 - 4.50 uIU/mL  CBC with Differential/Platelet  Result Value Ref Range   WBC 5.5 4.0 - 10.5 K/uL   RBC 4.51 3.87 - 5.11 Mil/uL   Hemoglobin 14.3 12.0 - 15.0 g/dL   HCT 74.242.1 59.536.0 - 63.846.0 %   MCV 93.2 78.0 - 100.0 fl   MCHC 33.9 30.0 - 36.0 g/dL   RDW 75.613.6 43.311.5 - 29.515.5 %   Platelets 203.0 150.0 - 400.0 K/uL   Neutrophils Relative % 68.6 43.0 - 77.0 %   Lymphocytes Relative 23.9 12.0 - 46.0 %   Monocytes Relative 4.8 3.0 - 12.0 %   Eosinophils Relative 2.4 0.0 - 5.0 %   Basophils Relative 0.3 0.0 - 3.0 %   Neutro Abs 3.8 1.4 - 7.7 K/uL   Lymphs Abs 1.3 0.7 - 4.0 K/uL   Monocytes Absolute 0.3 0.1 - 1.0 K/uL   Eosinophils Absolute 0.1 0.0 - 0.7 K/uL   Basophils Absolute 0.0 0.0 - 0.1 K/uL   CT HEAD WITHOUT CONTRAST TECHNIQUE: Contiguous axial images were obtained from the base of the skull through the vertex without intravenous contrast.  COMPARISON: None.  FINDINGS: Generalized atrophy. Negative for hydrocephalus.  Negative for acute or chronic infarction. Negative for hemorrhage or mass lesion.  Calvarium is normal.  IMPRESSION: Generalized atrophy. No acute or focal abnormality.   Electronically Signed  By: Marlan Palauharles Clark M.D.  On: 11/30/2014 17:11    Assessment & Plan:   Problem List Items Addressed This Visit    Encounter for therapeutic drug monitoring    INR today. Lab Results  Component Value Date   INR 2.0 11/16/2014   INR 2.1 10/19/2014   INR 2.2 09/28/2014        Imbalance - Primary    Resolution of imbalance with decreased lamictal dose to 25mg  bid. However I thought pt was prior on 50mg  bid but actually she was on 100mg  bid. Will increase lamictal to 50mg  bid (1/2 tablet of 100mg  dose) and monitor for return of imbalance. I will send today's note to  Dr Toni Amendlapacs to keep him informed. Pt agrees with plan.          Follow up plan: Return as needed, for follow up visit.

## 2014-12-07 NOTE — Assessment & Plan Note (Signed)
Resolution of imbalance with decreased lamictal dose to 25mg  bid. However I thought pt was prior on 50mg  bid but actually she was on 100mg  bid. Will increase lamictal to 50mg  bid (1/2 tablet of 100mg  dose) and monitor for return of imbalance. I will send today's note to Dr Toni Amendlapacs to keep him informed. Pt agrees with plan.

## 2014-12-07 NOTE — Patient Instructions (Addendum)
Let's take lamictal 50mg  twice daily (1/2 tablet). I'll send today's note to Dr Toni Amendlapacs. Watch for return of imbalance, and if it happens let us know. CT scan was looking ok.

## 2014-12-15 ENCOUNTER — Encounter: Payer: Self-pay | Admitting: Family Medicine

## 2014-12-26 ENCOUNTER — Encounter: Payer: Self-pay | Admitting: *Deleted

## 2015-01-01 ENCOUNTER — Ambulatory Visit (INDEPENDENT_AMBULATORY_CARE_PROVIDER_SITE_OTHER): Payer: Commercial Managed Care - HMO | Admitting: *Deleted

## 2015-01-01 DIAGNOSIS — I739 Peripheral vascular disease, unspecified: Secondary | ICD-10-CM

## 2015-01-01 DIAGNOSIS — Z86718 Personal history of other venous thrombosis and embolism: Secondary | ICD-10-CM

## 2015-01-01 DIAGNOSIS — Z5181 Encounter for therapeutic drug level monitoring: Secondary | ICD-10-CM | POA: Diagnosis not present

## 2015-01-01 LAB — POCT INR: INR: 1.5

## 2015-01-01 NOTE — Progress Notes (Signed)
Pre visit review using our clinic review tool, if applicable. No additional management support is needed unless otherwise documented below in the visit note. 

## 2015-01-04 ENCOUNTER — Other Ambulatory Visit: Payer: Self-pay

## 2015-01-18 ENCOUNTER — Ambulatory Visit (INDEPENDENT_AMBULATORY_CARE_PROVIDER_SITE_OTHER): Payer: Commercial Managed Care - HMO | Admitting: *Deleted

## 2015-01-18 DIAGNOSIS — I739 Peripheral vascular disease, unspecified: Secondary | ICD-10-CM | POA: Diagnosis not present

## 2015-01-18 DIAGNOSIS — Z86718 Personal history of other venous thrombosis and embolism: Secondary | ICD-10-CM

## 2015-01-18 DIAGNOSIS — Z5181 Encounter for therapeutic drug level monitoring: Secondary | ICD-10-CM | POA: Diagnosis not present

## 2015-01-18 LAB — POCT INR: INR: 1

## 2015-01-18 NOTE — Progress Notes (Signed)
Pre visit review using our clinic review tool, if applicable. No additional management support is needed unless otherwise documented below in the visit note. 

## 2015-01-29 ENCOUNTER — Ambulatory Visit (INDEPENDENT_AMBULATORY_CARE_PROVIDER_SITE_OTHER): Payer: Commercial Managed Care - HMO | Admitting: *Deleted

## 2015-01-29 DIAGNOSIS — Z86718 Personal history of other venous thrombosis and embolism: Secondary | ICD-10-CM

## 2015-01-29 DIAGNOSIS — I739 Peripheral vascular disease, unspecified: Secondary | ICD-10-CM

## 2015-01-29 DIAGNOSIS — Z5181 Encounter for therapeutic drug level monitoring: Secondary | ICD-10-CM

## 2015-01-29 LAB — POCT INR: INR: 2.5

## 2015-01-29 NOTE — Progress Notes (Signed)
Pre visit review using our clinic review tool, if applicable. No additional management support is needed unless otherwise documented below in the visit note. 

## 2015-02-01 ENCOUNTER — Encounter: Payer: Self-pay | Admitting: Psychiatry

## 2015-02-01 ENCOUNTER — Ambulatory Visit (INDEPENDENT_AMBULATORY_CARE_PROVIDER_SITE_OTHER): Payer: Commercial Managed Care - HMO | Admitting: Psychiatry

## 2015-02-01 VITALS — BP 118/78 | HR 74 | Temp 98.0°F | Ht <= 58 in | Wt 143.4 lb

## 2015-02-01 DIAGNOSIS — F333 Major depressive disorder, recurrent, severe with psychotic symptoms: Secondary | ICD-10-CM

## 2015-02-01 DIAGNOSIS — F411 Generalized anxiety disorder: Secondary | ICD-10-CM | POA: Diagnosis not present

## 2015-02-01 MED ORDER — BUPROPION HCL ER (SR) 200 MG PO TB12: 200.0000 mg | ORAL_TABLET | Freq: Two times a day (BID) | ORAL | Status: DC

## 2015-02-01 MED ORDER — MIRTAZAPINE 15 MG PO TABS: 15.0000 mg | ORAL_TABLET | Freq: Every day | ORAL | Status: DC

## 2015-02-01 MED ORDER — CLONAZEPAM 1 MG PO TABS: 1.0000 mg | ORAL_TABLET | Freq: Two times a day (BID) | ORAL | Status: DC

## 2015-02-01 MED ORDER — LAMOTRIGINE 100 MG PO TABS: 50.0000 mg | ORAL_TABLET | Freq: Two times a day (BID) | ORAL | Status: DC

## 2015-02-02 NOTE — Progress Notes (Signed)
[facility] MD/PA/NP OP Progress Note  02/02/2015 5:08 PM Kathryn Mathews  MRN:  [id number]  Subjective:  "I guess I'm getting along" patient reports that she is still having chronic anxiety but is not as hopeless as she was before. Chief Complaint: Chronic depression and anxiety specifically stressed about relationship with her family Chief Complaint    Follow-up; Medication Refill; Anxiety; Depression     Visit Diagnosis:     ICD-9-CM ICD-10-CM   1. Major psychotic depression, recurrent 296.34 F33.3   2. Generalized anxiety disorder 300.02 F41.1     Past Medical History:  Past Medical History  Diagnosis Date  . High cholesterol   . Anxiety   . Depression   . Humerus fracture   . Hypothyroidism   . Pulmonary embolism   . History of chicken pox   . Bipolar disorder     no psych  . GERD (gastroesophageal reflux disease)   . HLD (hyperlipidemia)   . Hypothyroid     previously hyperthyroid  . History of DVT (deep vein thrombosis)     on chronic coumadin since 1973, h/o blood clots on coumadin 2011, added ASA QD, but due to fall risk stopped and changed goal INR to 2.5-3.0  . Phlebitis     left leg stays swollen  . PAD (peripheral artery disease)   . Alopecia     well controlled per previous PCP - improved with biotin  . Chronic sinusitis   . Glucose intolerance (impaired glucose tolerance)   . Closed right humeral fracture 2011    coracoid nondisplaced, conservative therapy (Dr. Cleon Gustin)  . Closed left humeral fracture 2010    closed left humeral neck fracture, also h/o L wrist fx  . Hyperactivity of bladder   . Osteopenia 2010    DEXA 2010: T score -1.3 L spine, -2.1 femoral neck  . Falls     completed HHPT 10/2011, declined outpt PT  . Anxiety     when around ppl  . Diverticulosis     by colonoscopy    Past Surgical History  Procedure Laterality Date  . Cesarean section    . Left heart catheterization with coronary angiogram N/A 09/05/2014    Procedure: LEFT HEART  CATHETERIZATION WITH CORONARY ANGIOGRAM;  Surgeon: Marykay Lex, MD;  Location: [facility] CATH LAB;  Service: Cardiovascular;  Laterality: N/A;  . Cholecystectomy    . Cesarean section  1973  . Arterial thrombectomy  07/2009    with R mid popliteal artery angioplasty [balloon] (2011)[[[[  . Hospitalization  01/2007    depression, ECT  . Inguinal hernia repair  1980s    x2 on right  . Colonoscopy  08/2008    int/ext hem, severe diverticulosis with evidence of itis, rec rpt 10 yrs  . Cardiac catheterization  08/2014    nl EF, small branch OM not amenable to PCI [name])   Family History:  Family History  Problem Relation Age of Onset  . Heart attack Mother 56  . Cancer Father   . Coronary artery disease Mother 33  . Hyperlipidemia Mother   . Heart disease Mother   . Hypertension Mother   . Breast cancer Maternal Aunt 54  . Coronary artery disease Maternal Uncle   . Coronary artery disease Maternal Grandmother 70  . Stroke Neg Hx   . Diabetes Neg Hx   . Cancer Neg Hx    Social History:  History   Social History  . Marital Status: Married    Spouse  Name: N/A  . Number of Children: N/A  . Years of Education: N/A   Occupational History  .      retired   Social History Main Topics  . Smoking status: Never Smoker   . Smokeless tobacco: Never Used  . Alcohol Use: No  . Drug Use: No  . Sexual Activity: No   Other Topics Concern  . None   Social History Narrative   ** Merged History Encounter **       ** Data from: 11/17/14 Enc Dept: [facility]   Caffeine: 2 diet coke/day   Lives with husband, 1 cat.   1 grown son - lives nearby.   Moved from [place].   Activity: no regular exercise   Diet: good water, fruits/vegetables daily      Advanced directive - states "if I can't think, just let me go". Thinks husband knows, would want husband then son to make decisions, but nothing formal yet. Requests packet today.       ** Data from: 09/05/14 Enc Dept: [facility]    Born: [place], [place]    Additional History: Patient presented for follow-up of chronic anxiety and depression. Appears to be tolerating medicine and functioning a little bit better. Mood is stated as being not so bad. Sleep is adequate. Still has stressful interactions with her family. Denies suicidal ideation.  Assessment:  Stable with slight improvement. Musculoskeletal: Strength & Muscle Tone: within normal limits Gait & Station: normal Patient leans: N/A  Psychiatric Specialty Exam: HPI  ROS  Blood pressure 118/78, pulse 74, temperature 98 F (36.7 C), temperature source Tympanic, height 4\' 8"  (1.422 m), weight 143 lb 6.4 oz (65.046 kg), SpO2 95 %.Body mass index is 32.17 kg/(m^2).  General Appearance: Fairly Groomed  Eye Contact:  Good  Speech:  Clear and Coherent  Volume:  Normal  Mood:  Anxious  Affect:  Appropriate  Thought Process:  Goal Directed  Orientation:  Full (Time, Place, and Person)  Thought Content:  Negative  Suicidal Thoughts:  No  Homicidal Thoughts:  No  Memory:  Immediate;   Good Recent;   Good Remote;   Good  Judgement:  Intact  Insight:  Present  Psychomotor Activity:  Normal  Concentration:  Fair  Recall:  [employer] of Knowledge: Fair  Language: Fair  Akathisia:  No  Handed:  Right  AIMS (if indicated):     Assets:  Communication Skills Desire for Improvement Financial Resources/Insurance Housing Physical Health Social Support  ADL's:  Intact  Cognition: WNL  Sleep:  ok   Is the patient at risk to self?  No. Has the patient been a risk to self in the past 6 months?  No. Has the patient been a risk to self within the distant past?  No. Is the patient a risk to others?  No. Has the patient been a risk to others in the past 6 months?  No. Has the patient been a risk to others within the distant past?  No.  Current Medications: Current Outpatient Prescriptions  Medication Sig Dispense Refill  . acetaminophen (TYLENOL) 500 MG tablet  Take 500 mg by mouth every 8 (eight) hours as needed for moderate pain.    Marland Kitchen atorvastatin (LIPITOR) 10 MG tablet Take 1 tablet (10 mg total) by mouth at bedtime. 90 tablet 2  . Biotin 1000 MCG tablet Take 1,000 mcg by mouth 2 (two) times daily.      Marland Kitchen buPROPion (WELLBUTRIN SR) 200 MG 12 hr  tablet Take 1 tablet (200 mg total) by mouth 2 (two) times daily. 180 tablet 1  . Calcium Carb-Cholecalciferol (CALCIUM-VITAMIN D) 600-400 MG-UNIT TABS Take 1 tablet by mouth daily.    . Cholecalciferol (VITAMIN D) 2000 UNITS CAPS Take 1 capsule (2,000 Units total) by mouth daily.    . clonazePAM (KLONOPIN) 1 MG tablet Take 1 tablet (1 mg total) by mouth 2 (two) times daily. 180 tablet 1  . clotrimazole-betamethasone (LOTRISONE) cream Apply 1 application topically 2 (two) times daily. 45 g 0  . lamoTRIgine (LAMICTAL) 100 MG tablet Take 0.5 tablets (50 mg total) by mouth 2 (two) times daily. 90 tablet 1  . levothyroxine (SYNTHROID, LEVOTHROID) 50 MCG tablet TAKE 1 TABLET (50 MCG TOTAL) BY MOUTH DAILY. 90 tablet 2  . metoprolol tartrate (LOPRESSOR) 25 MG tablet Take 0.5 tablets (12.5 mg total) by mouth 2 (two) times daily with a meal. 60 tablet 0  . mirtazapine (REMERON) 15 MG tablet Take 1 tablet (15 mg total) by mouth at bedtime. 90 tablet 1  . warfarin (COUMADIN) 2.5 MG tablet Take 1 tablet (2.5 mg total) by mouth every Monday, Wednesday, and Friday. 45 tablet 1  . warfarin (COUMADIN) 5 MG tablet Take daily as directed by [facility]. 90 tablet 1   No current facility-administered medications for this visit.    Medical Decision Making:  Established Problem, Stable/Improving (1), Review of Psycho-Social Stressors (1), Review of Medication Regimen & Side Effects (2) and Review of New Medication or Change in Dosage (2)  Treatment Plan Summary:Medication management and Plan Continue current medication management with lamotrigine bupropion. Add mirtazapine 15 mg at night to assist with mood and chronic anxiety.  Follow-up in 3 months.   Sheena Donegan 02/02/2015, 5:08 PM

## 2015-02-06 ENCOUNTER — Telehealth: Payer: Self-pay

## 2015-02-06 NOTE — Telephone Encounter (Signed)
Called to notify patient of being due for a Mammogram. Patient would like to schedule an appointment with [facility]. I provided them with the contact information, and patient stated that she would call and set up an appointment.  

## 2015-02-26 ENCOUNTER — Ambulatory Visit (INDEPENDENT_AMBULATORY_CARE_PROVIDER_SITE_OTHER): Payer: Commercial Managed Care - HMO | Admitting: *Deleted

## 2015-02-26 DIAGNOSIS — I739 Peripheral vascular disease, unspecified: Secondary | ICD-10-CM | POA: Diagnosis not present

## 2015-02-26 DIAGNOSIS — Z5181 Encounter for therapeutic drug level monitoring: Secondary | ICD-10-CM | POA: Diagnosis not present

## 2015-02-26 DIAGNOSIS — Z86718 Personal history of other venous thrombosis and embolism: Secondary | ICD-10-CM | POA: Diagnosis not present

## 2015-02-26 LAB — POCT INR: INR: 4.1

## 2015-02-26 NOTE — Progress Notes (Signed)
Pre visit review using our clinic review tool, if applicable. No additional management support is needed unless otherwise documented below in the visit note. 

## 2015-03-12 ENCOUNTER — Ambulatory Visit (INDEPENDENT_AMBULATORY_CARE_PROVIDER_SITE_OTHER): Payer: Commercial Managed Care - HMO | Admitting: *Deleted

## 2015-03-12 DIAGNOSIS — Z86718 Personal history of other venous thrombosis and embolism: Secondary | ICD-10-CM | POA: Diagnosis not present

## 2015-03-12 DIAGNOSIS — I739 Peripheral vascular disease, unspecified: Secondary | ICD-10-CM

## 2015-03-12 DIAGNOSIS — Z5181 Encounter for therapeutic drug level monitoring: Secondary | ICD-10-CM

## 2015-03-12 LAB — POCT INR: INR: 1.2

## 2015-03-12 NOTE — Progress Notes (Signed)
Pre visit review using our clinic review tool, if applicable. No additional management support is needed unless otherwise documented below in the visit note. 

## 2015-03-22 ENCOUNTER — Ambulatory Visit (INDEPENDENT_AMBULATORY_CARE_PROVIDER_SITE_OTHER): Payer: Commercial Managed Care - HMO | Admitting: *Deleted

## 2015-03-22 DIAGNOSIS — I739 Peripheral vascular disease, unspecified: Secondary | ICD-10-CM | POA: Diagnosis not present

## 2015-03-22 DIAGNOSIS — Z5181 Encounter for therapeutic drug level monitoring: Secondary | ICD-10-CM

## 2015-03-22 DIAGNOSIS — Z86718 Personal history of other venous thrombosis and embolism: Secondary | ICD-10-CM

## 2015-03-22 LAB — POCT INR: INR: 1.7

## 2015-03-22 NOTE — Progress Notes (Signed)
Pre visit review using our clinic review tool, if applicable. No additional management support is needed unless otherwise documented below in the visit note. 

## 2015-04-01 ENCOUNTER — Other Ambulatory Visit: Payer: Self-pay | Admitting: Family Medicine

## 2015-04-01 DIAGNOSIS — E785 Hyperlipidemia, unspecified: Secondary | ICD-10-CM

## 2015-04-03 ENCOUNTER — Other Ambulatory Visit (INDEPENDENT_AMBULATORY_CARE_PROVIDER_SITE_OTHER): Payer: Commercial Managed Care - HMO

## 2015-04-03 DIAGNOSIS — E785 Hyperlipidemia, unspecified: Secondary | ICD-10-CM | POA: Diagnosis not present

## 2015-04-03 LAB — BASIC METABOLIC PANEL
BUN: 14 mg/dL (ref 6–23)
CALCIUM: 9.2 mg/dL (ref 8.4–10.5)
CHLORIDE: 109 meq/L (ref 96–112)
CO2: 25 meq/L (ref 19–32)
Creatinine, Ser: 0.86 mg/dL (ref 0.40–1.20)
GFR: 69.45 mL/min (ref >60.00)
GLUCOSE: 74 mg/dL (ref 70–99)
POTASSIUM: 4.1 meq/L (ref 3.5–5.1)
SODIUM: 141 meq/L (ref 135–145)

## 2015-04-03 LAB — LDL CHOLESTEROL, DIRECT: Direct LDL: 81 mg/dL

## 2015-04-04 DIAGNOSIS — Z0100 Encounter for examination of eyes and vision without abnormal findings: Secondary | ICD-10-CM | POA: Diagnosis not present

## 2015-04-11 ENCOUNTER — Telehealth: Payer: Self-pay | Admitting: Psychiatry

## 2015-04-12 ENCOUNTER — Ambulatory Visit (INDEPENDENT_AMBULATORY_CARE_PROVIDER_SITE_OTHER): Payer: Commercial Managed Care - HMO | Admitting: *Deleted

## 2015-04-12 ENCOUNTER — Encounter: Payer: Self-pay | Admitting: Family Medicine

## 2015-04-12 ENCOUNTER — Ambulatory Visit (INDEPENDENT_AMBULATORY_CARE_PROVIDER_SITE_OTHER): Payer: Commercial Managed Care - HMO | Admitting: Family Medicine

## 2015-04-12 VITALS — BP 122/74 | HR 72 | Temp 97.9°F | Ht <= 58 in | Wt 146.0 lb

## 2015-04-12 DIAGNOSIS — Z86718 Personal history of other venous thrombosis and embolism: Secondary | ICD-10-CM

## 2015-04-12 DIAGNOSIS — Z1239 Encounter for other screening for malignant neoplasm of breast: Secondary | ICD-10-CM

## 2015-04-12 DIAGNOSIS — M8580 Other specified disorders of bone density and structure, unspecified site: Secondary | ICD-10-CM

## 2015-04-12 DIAGNOSIS — E785 Hyperlipidemia, unspecified: Secondary | ICD-10-CM

## 2015-04-12 DIAGNOSIS — Z23 Encounter for immunization: Secondary | ICD-10-CM | POA: Diagnosis not present

## 2015-04-12 DIAGNOSIS — Z0000 Encounter for general adult medical examination without abnormal findings: Secondary | ICD-10-CM | POA: Diagnosis not present

## 2015-04-12 DIAGNOSIS — S32010D Wedge compression fracture of first lumbar vertebra, subsequent encounter for fracture with routine healing: Secondary | ICD-10-CM

## 2015-04-12 DIAGNOSIS — E039 Hypothyroidism, unspecified: Secondary | ICD-10-CM

## 2015-04-12 DIAGNOSIS — R110 Nausea: Secondary | ICD-10-CM

## 2015-04-12 DIAGNOSIS — R2689 Other abnormalities of gait and mobility: Secondary | ICD-10-CM

## 2015-04-12 DIAGNOSIS — G4700 Insomnia, unspecified: Secondary | ICD-10-CM

## 2015-04-12 DIAGNOSIS — F3160 Bipolar disorder, current episode mixed, unspecified: Secondary | ICD-10-CM

## 2015-04-12 DIAGNOSIS — Z7189 Other specified counseling: Secondary | ICD-10-CM | POA: Diagnosis not present

## 2015-04-12 DIAGNOSIS — Z5181 Encounter for therapeutic drug level monitoring: Secondary | ICD-10-CM

## 2015-04-12 DIAGNOSIS — I739 Peripheral vascular disease, unspecified: Secondary | ICD-10-CM

## 2015-04-12 LAB — POCT INR: INR: 3

## 2015-04-12 NOTE — Assessment & Plan Note (Signed)
Improved on lower lamictal dose of  BID.

## 2015-04-12 NOTE — Assessment & Plan Note (Signed)
Preventative protocols reviewed and updated unless pt declined. Discussed healthy diet and lifestyle.  

## 2015-04-12 NOTE — Assessment & Plan Note (Signed)
Continue coumadin.  

## 2015-04-12 NOTE — Progress Notes (Signed)
BP 122/74 mmHg  Pulse 72  Temp(Src) 97.9 F (36.6 C) (Oral)  Ht 4' 9.25" (1.454 m)  Wt 146 lb (66.225 kg)  BMI 31.33 kg/m2   CC: medicare wellness visit  Subjective:    Patient ID: Kathryn Mathews, female    DOB: 08/03/1945, 69 y.o.   MRN: [id number]  HPI: Kathryn Mathews is a 69 y.o. female presenting on 04/12/2015 for Annual Exam   Bedtime 10pm - reads until 12am then stays awake until 4-5 am, then stays asleep into the afternoon. No daytime naps. Remeron  prescribed by Dr Toni Amend   Passes vision screen.  Passes hearing screen today.  No falls this year PHQ9 = 13. Known bipolar under treatment.  Preventative: COLONOSCOPY Date: 08/2008 int/ext hem, severe diverticulosis with evidence of itis, rec rpt 10 yrs Mammogram - normal 03/2012. Did not keep last year's referral for rpt mammogram. Declines breast exam today, does not do breast exams at home.  Pap smear - Always normal in past. No problems with spotting, not sexually active. Decided to age out.  DEXA 2010 - osteopenia, femoral -2.1. Compliant with calcium and vit D. Flu shot - today Tetanus - 07/2011  Pneumovax 02/2011, prevnar 03/2014 zostavax 08/2011  Advanced directive - states "if I can't think, just let me go". Thinks husband knows, would want husband then son to make decisions, but nothing formal yet. Requests packet today. Seat belt use discussed Sunscreen use discussed, no changing moles on skin  Caffeine: 2 diet coke/day Lives with husband, 1 cat. 1 grown son - lives nearby. [place]oved from [place]. Activity: no regular exercise Diet: good water, fruits/vegetables daily  Relevant past medical, surgical, family and social history reviewed and updated as indicated. Interim medical history since our last visit reviewed. Allergies and medications reviewed and updated. Current Outpatient Prescriptions on File Prior to Visit  Medication Sig  . acetaminophen (TYLENOL) 500 MG tablet Take 500 mg by mouth every 8  (eight) hours as needed for moderate pain.  Marland Kitchen atorvastatin (LIPITOR) 10 MG tablet Take 1 tablet (10 mg total) by mouth at bedtime.  . Biotin 1000 MCG tablet Take 1,000 mcg by mouth 2 (two) times daily.    Marland Kitchen buPROPion (WELLBUTRIN SR) 200 MG 12 hr tablet Take 1 tablet (200 mg total) by mouth 2 (two) times daily.  . Calcium Carb-Cholecalciferol (CALCIUM-VITAMIN D) 600-400 MG-UNIT TABS Take 1 tablet by mouth daily.  . Cholecalciferol (VITAMIN D) 2000 UNITS CAPS Take 1 capsule (2,000 Units total) by mouth daily.  . clonazePAM (KLONOPIN) 1 MG tablet Take 1 tablet (1 mg total) by mouth 2 (two) times daily.  . clotrimazole-betamethasone (LOTRISONE) cream Apply 1 application topically 2 (two) times daily.  Marland Kitchen lamoTRIgine (LAMICTAL) 100 MG tablet Take 0.5 tablets (50 mg total) by mouth 2 (two) times daily.  Marland Kitchen levothyroxine (SYNTHROID, LEVOTHROID) 50 MCG tablet TAKE 1 TABLET (50 MCG TOTAL) BY MOUTH DAILY.  . metoprolol tartrate (LOPRESSOR) 25 MG tablet Take 0.5 tablets (12.5 mg total) by mouth 2 (two) times daily with a meal.  . mirtazapine (REMERON) 15 MG tablet Take 1 tablet (15 mg total) by mouth at bedtime.  Marland Kitchen warfarin (COUMADIN) 2.5 MG tablet Take 1 tablet (2.5 mg total) by mouth every Monday, Wednesday, and Friday.  . warfarin (COUMADIN) 5 MG tablet Take daily as directed by [facility].   No current facility-administered medications on file prior to visit.    Review of Systems  Constitutional: Negative for fever, chills, activity change, appetite  change, fatigue and unexpected weight change.  HENT: Negative for hearing loss.   Eyes: Negative for visual disturbance.  Respiratory: Negative for cough, chest tightness, shortness of breath and wheezing.   Cardiovascular: Negative for chest pain, palpitations and leg swelling.  Gastrointestinal: Positive for abdominal pain (improved with gas x). Negative for nausea, vomiting, diarrhea, constipation, blood in stool and abdominal distention.    Genitourinary: Negative for hematuria and difficulty urinating.  Musculoskeletal: Negative for myalgias, arthralgias and neck pain.  Skin: Negative for rash.  Neurological: Negative for dizziness, seizures, syncope and headaches.  Hematological: Negative for adenopathy. Does not bruise/bleed easily.  Psychiatric/Behavioral: Positive for dysphoric mood. The patient is not nervous/anxious.    Per HPI unless specifically indicated above     Objective:    BP 122/74 mmHg  Pulse 72  Temp(Src) 97.9 F (36.6 C) (Oral)  Ht 4' 9.25" (1.454 m)  Wt 146 lb (66.225 kg)  BMI 31.33 kg/m2  Wt Readings from Last 3 Encounters:  04/12/15 146 lb (66.225 kg)  02/01/15 143 lb 6.4 oz (65.046 kg)  12/07/14 143 lb 12 oz (65.205 kg)    Physical Exam  Constitutional: She is oriented to person, place, and time. She appears well-developed and well-nourished. No distress.  HENT:  Head: Normocephalic and atraumatic.  Right Ear: Hearing, tympanic membrane, external ear and ear canal normal.  Left Ear: Hearing, tympanic membrane, external ear and ear canal normal.  Nose: Nose normal.  Mouth/Throat: Uvula is midline, oropharynx is clear and moist and mucous membranes are normal. No oropharyngeal exudate, posterior oropharyngeal edema or posterior oropharyngeal erythema.  Eyes: Conjunctivae and EOM are normal. Pupils are equal, round, and reactive to light. No scleral icterus.  Neck: Normal range of motion. Neck supple. Carotid bruit is not present. No thyromegaly present.  Cardiovascular: Normal rate, regular rhythm and intact distal pulses.   Murmur (3/6 SEM) heard. Pulses:      Radial pulses are 2+ on the right side, and 2+ on the left side.  Pulmonary/Chest: Effort normal and breath sounds normal. No respiratory distress. She has no wheezes. She has no rales.  Declines breast exam  Abdominal: Soft. Bowel sounds are normal. She exhibits no distension and no mass. There is tenderness (mild) in the epigastric  area and suprapubic area. There is no rebound and no guarding.  Musculoskeletal: Normal range of motion. She exhibits no edema.  Lymphadenopathy:    She has no cervical adenopathy.  Neurological: She is alert and oriented to person, place, and time.  CN grossly intact, station and gait intact Recall 3/3 Calculation 5/5 serial 3s  Skin: Skin is warm and dry. No rash noted.  Psychiatric: She has a normal mood and affect. Her behavior is normal. Judgment and thought content normal.  Nursing note and vitals reviewed.  Results for orders placed or performed in visit on 04/12/15  POCT INR  Result Value Ref Range   INR 3.0       Assessment & Plan:   Problem List Items Addressed This Visit    PAD (peripheral artery disease)    Continue coumadin.      Osteopenia    Discussed with patient - update DEXA.      Relevant Orders   DG Bone Density   Medicare annual wellness visit, subsequent - Primary    I have personally reviewed the Medicare Annual Wellness questionnaire and have noted 1. The patient's medical and social history 2. Their use of alcohol, tobacco or illicit drugs 3.  Their current medications and supplements 4. The patient's functional ability including ADL's, fall risks, home safety risks and hearing or visual impairment. Cognitive function has been assessed and addressed as indicated.  5. Diet and physical activity 6. Evidence for depression or mood disorders The patients weight, height, BMI have been recorded in the chart. I have made referrals, counseling and provided education to the patient based on review of the above and I have provided the pt with a written personalized care plan for preventive services. Provider list updated.. See scanned questionairre as needed for further documentation. Reviewed preventative protocols and updated unless pt declined.       Insomnia    Regularly sleeping 4am-2pm, tossing and turning from 12-4am. Suggested she check with Dr  Toni Amend about lower remeron dose for increased somnolence side effect. In interim, start melatonin  nightly.      Imbalance    Improved on lower lamictal dose of  BID.      Hypothyroid    Chronic, stable. Continue current regimen.  Lab Results  Component Value Date   TSH 1.78 11/17/2014         HLD (hyperlipidemia)    Chronic, stable. Continue atorvastatin  daily.      Health maintenance examination    Preventative protocols reviewed and updated unless pt declined. Discussed healthy diet and lifestyle.       Compression fracture of L1 lumbar vertebra    Update DEXA.      Chronic nausea    Improving with gasX regularly.      Bipolar disorder    Appreciate psych care of patient.      Advanced care planning/counseling discussion    Advanced directive - states "if I can't think, just let me go". Thinks husband knows, would want husband then son to make decisions, but nothing formal yet. Requests packet today.       Other Visit Diagnoses    Need for influenza vaccination        Relevant Orders    Flu Vaccine QUAD 36+ mos PF IM (Fluarix & Fluzone Quad PF) (Completed)    Breast cancer screening        Relevant Orders    MM DIGITAL SCREENING BILATERAL        Follow up plan: Return in about 6 months (around 10/10/2015), or as needed, for follow up visit.

## 2015-04-12 NOTE — Assessment & Plan Note (Signed)
Update DEXA.  

## 2015-04-12 NOTE — Assessment & Plan Note (Signed)
Chronic, stable. Continue current regimen.  Lab Results  Component Value Date   TSH 1.78 11/17/2014

## 2015-04-12 NOTE — Assessment & Plan Note (Signed)
Advanced directive - states "if I can't think, just let me go". Thinks husband knows, would want husband then son to make decisions, but nothing formal yet. Requests packet today.

## 2015-04-12 NOTE — Progress Notes (Signed)
Pre visit review using our clinic review tool, if applicable. No additional management support is needed unless otherwise documented below in the visit note. 

## 2015-04-12 NOTE — Assessment & Plan Note (Signed)
Chronic, stable. Continue atorvastatin  daily.

## 2015-04-12 NOTE — Assessment & Plan Note (Signed)
Discussed with patient - update DEXA.

## 2015-04-12 NOTE — Assessment & Plan Note (Signed)
Regularly sleeping 4am-2pm, tossing and turning from 12-4am. Suggested she check with Dr Toni Amend about lower remeron dose for increased somnolence side effect. In interim, start melatonin  nightly.

## 2015-04-12 NOTE — Patient Instructions (Addendum)
Ask Dr Toni Amend about decreasing remeron to 7.5mg  nightly (will make you more sleepy). Start melatonin over the counter  nightly for sleep.  Call and schedule mammogram at breast center [phone] Schedule bone density scan - pass by Marion's office to schedule bone density scan. Advanced directive packet provided today Nice to see you today, call us with questions. Return in 6 months for follow up visit.

## 2015-04-12 NOTE — Assessment & Plan Note (Signed)

## 2015-04-12 NOTE — Assessment & Plan Note (Signed)
Improving with gasX regularly.

## 2015-04-12 NOTE — Assessment & Plan Note (Signed)
Appreciate psych care of patient.  

## 2015-04-23 NOTE — Telephone Encounter (Signed)
Will call in

## 2015-04-24 ENCOUNTER — Ambulatory Visit
Admission: RE | Admit: 2015-04-24 | Discharge: 2015-04-24 | Disposition: A | Discharge status: Home / Self Care | Payer: Commercial Managed Care - HMO | Source: Ambulatory Visit | Attending: Family Medicine | Admitting: Family Medicine

## 2015-04-24 DIAGNOSIS — Z1231 Encounter for screening mammogram for malignant neoplasm of breast: Secondary | ICD-10-CM | POA: Diagnosis not present

## 2015-04-24 DIAGNOSIS — M8580 Other specified disorders of bone density and structure, unspecified site: Secondary | ICD-10-CM

## 2015-04-24 DIAGNOSIS — Z1239 Encounter for other screening for malignant neoplasm of breast: Secondary | ICD-10-CM

## 2015-04-24 DIAGNOSIS — M85851 Other specified disorders of bone density and structure, right thigh: Secondary | ICD-10-CM | POA: Diagnosis not present

## 2015-04-25 LAB — HM MAMMOGRAPHY: HM Mammogram: NORMAL

## 2015-04-26 ENCOUNTER — Encounter: Payer: Self-pay | Admitting: *Deleted

## 2015-04-28 ENCOUNTER — Encounter: Payer: Self-pay | Admitting: Family Medicine

## 2015-05-07 ENCOUNTER — Encounter: Payer: Self-pay | Admitting: Psychiatry

## 2015-05-07 ENCOUNTER — Ambulatory Visit (INDEPENDENT_AMBULATORY_CARE_PROVIDER_SITE_OTHER): Payer: Commercial Managed Care - HMO | Admitting: Psychiatry

## 2015-05-07 VITALS — BP 118/82 | HR 82 | Temp 97.2°F | Ht 58.5 in | Wt 145.6 lb

## 2015-05-07 DIAGNOSIS — F333 Major depressive disorder, recurrent, severe with psychotic symptoms: Secondary | ICD-10-CM | POA: Diagnosis not present

## 2015-05-07 NOTE — Progress Notes (Signed)
[facility] MD Progress Note  05/07/2015 7:57 PM Kathryn Mathews  MRN:  [id number] Subjective:  Follow-up 69 year old woman with chronic depression and anxiety. No new complaints. Sleep continues to be a complaint. Mirtazapine did not help. Neither did melatonin given by her primary care doctor. She still has a lot of complaints about how her family treats her. No suicidal thoughts. No evidence of psychosis. Principal Problem: @ Diagnosis:   Patient Active Problem List   Diagnosis Date Noted  . Health maintenance examination [Z00.00] 04/12/2015  . Insomnia [G47.00] 04/12/2015  . Chronic nausea [R11.0] 09/09/2014  . Anginal chest pain at rest [facility]) [I20.8] 09/09/2014  . Shortness of breath [R06.02]   . Abnormal nuclear stress test [R93.1] 09/04/2014  . Essential hypertension [I10] 09/02/2014  . Skin rash [R21] 05/29/2014  . Facial rash [R21] 05/23/2014  . Advanced care planning/counseling discussion [Z71.89] 04/10/2014  . Falls [phone]] 03/28/2014  . Encounter for therapeutic drug monitoring [Z51.81] 08/22/2013  . Urinary urgency [R39.15] 03/12/2013  . Ventral hernia [K43.9] 09/07/2012  . Unspecified constipation [K59.00] 09/07/2012  . Abdominal discomfort [R10.9] 08/09/2012  . Spondylolisthesis, grade 3 [M43.19] 07/15/2012  . Compression fracture of L1 lumbar vertebra (HCC) [S32.010A] 07/15/2012  . Loss of taste [R43.2] 03/09/2012  . Imbalance [R26.89] 09/18/2011  . Dysphagia [787.2] 07/21/2011  . Medicare annual wellness visit, subsequent [Z00.00] 02/18/2011  . Vitamin D deficiency [E55.9] 02/18/2011  . Osteopenia [M85.80]   . Bipolar disorder (HCC) [F31.9]   . GERD (gastroesophageal reflux disease) [K21.9]   . HLD (hyperlipidemia) [E78.5]   . Hypothyroid [E03.9]   . History of DVT (deep vein thrombosis) [Z86.718]   . PAD (peripheral artery disease) (HCC) [I73.9]    Total Time spent with patient: 20 minutes  Past Psychiatric History: past history of recurrent major  depression. Partial response to medication. No history of mania.  Past Medical History:  Past Medical History  Diagnosis Date  . High cholesterol   . Anxiety   . Depression   . Humerus fracture   . Hypothyroidism   . Pulmonary embolism (HCC)   . History of chicken pox   . Bipolar disorder (HCC)     no psych  . GERD (gastroesophageal reflux disease)   . HLD (hyperlipidemia)   . Hypothyroid     previously hyperthyroid  . History of DVT (deep vein thrombosis)     on chronic coumadin since 1973, h/o blood clots on coumadin 2011, added ASA QD, but due to fall risk stopped and changed goal INR to 2.5-3.0  . Phlebitis     left leg stays swollen  . PAD (peripheral artery disease) (HCC)   . Alopecia     well controlled per previous PCP - improved with biotin  . Chronic sinusitis   . Glucose intolerance (impaired glucose tolerance)   . Closed right humeral fracture 2011    coracoid nondisplaced, conservative therapy (Dr. Cleon Gustin)  . Closed left humeral fracture 2010    closed left humeral neck fracture, also h/o L wrist fx  . Hyperactivity of bladder   . Osteopenia 2010, 2016    DEXA 2010: T -1.3 L spine, -2.1 femoral neck. DEXA 2016: T -2.4 hip, T -0.5 spine; rpt 2 yrs  . Falls     completed HHPT 10/2011, declined outpt PT  . Anxiety     when around ppl  . Diverticulosis     by colonoscopy    Past Surgical History  Procedure Laterality Date  . Cesarean section    .  Left heart catheterization with coronary angiogram N/A 09/05/2014    Procedure: LEFT HEART CATHETERIZATION WITH CORONARY ANGIOGRAM;  Surgeon: Marykay Lexavid W [facility], MD;  [facility];  Service: Cardiovascular;  Laterality: N/A;  . Cholecystectomy    . Cesarean section  1973  . Arterial thrombectomy  07/2009    with R mid popliteal artery angioplasty [balloon] (2011)[[[[  . Hospitalization  01/2007    depression, ECT  . Inguinal hernia repair  1980s    x2 on right  . Colonoscopy  08/2008    int/ext hem, severe  diverticulosis with evidence of itis, rec rpt 10 yrs  . Cardiac catheterization  08/2014    nl EF, small branch OM not amenable to PCI [name]([facility])   Family History:  Family History  Problem Relation Age of Onset  . Heart attack Mother [id number]  . Cancer Father   . Coronary artery disease Mother [id number]  . Hyperlipidemia Mother   . Heart disease Mother   . Hypertension Mother   . Breast cancer Maternal Aunt 54  . Coronary artery disease Maternal Uncle   . Coronary artery disease Maternal Grandmother 70  . Stroke Neg Hx   . Diabetes Neg Hx   . Cancer Neg Hx    Family Psychiatric  History: family history of anxiety symptoms Social History:  History  Alcohol Use No     History  Drug Use No    Social History   Social History  . Marital Status: Married    Spouse Name: N/A  . Number of Children: N/A  . Years of Education: N/A   Occupational History  .      retired   Social History Main Topics  . Smoking status: Never Smoker   . Smokeless tobacco: Never Used  . Alcohol Use: No  . Drug Use: No  . Sexual Activity: No   Other Topics Concern  . None   Social History Narrative   Caffeine: 2 diet coke/day   Born: [place], [place]   Lives with husband, 1 cat.   1 grown son - lives nearby.   Moved from [place].   Activity: no regular exercise   Diet: good water, fruits/vegetables daily   Additional Social History:                         Sleep: Poor  Appetite:  Good  Current Medications: Current Outpatient Prescriptions  Medication Sig Dispense Refill  . acetaminophen (TYLENOL) 500 MG tablet Take 500 mg by mouth every 8 (eight) hours as needed for moderate pain.    Marland Kitchen. atorvastatin (LIPITOR) 10 MG tablet Take 1 tablet (10 mg total) by mouth at bedtime. 90 tablet 2  . Biotin 1000 MCG tablet Take 1,000 mcg by mouth 2 (two) times daily.      Marland Kitchen. buPROPion (WELLBUTRIN SR) 200 MG 12 hr tablet Take 1 tablet (200 mg total) by mouth 2 (two) times daily. 180 tablet 1  . Calcium  Carb-Cholecalciferol (CALCIUM-VITAMIN D) 600-400 MG-UNIT TABS Take 1 tablet by mouth daily.    . Cholecalciferol (VITAMIN D) 2000 UNITS CAPS Take 1 capsule (2,000 Units total) by mouth daily.    . clonazePAM (KLONOPIN) 1 MG tablet Take 1 tablet (1 mg total) by mouth 2 (two) times daily. 180 tablet 1  . clotrimazole-betamethasone (LOTRISONE) cream Apply 1 application topically 2 (two) times daily. 45 g 0  . lamoTRIgine (LAMICTAL) 100 MG tablet Take 0.5 tablets (50 mg total) by mouth  2 (two) times daily. 90 tablet 1  . levothyroxine (SYNTHROID, LEVOTHROID) 50 MCG tablet TAKE 1 TABLET (50 MCG TOTAL) BY MOUTH DAILY. 90 tablet 2  . Melatonin 1 MG CAPS Take 1 capsule by mouth daily.    . metoprolol tartrate (LOPRESSOR) 25 MG tablet Take 0.5 tablets (12.5 mg total) by mouth 2 (two) times daily with a meal. 60 tablet 0  . warfarin (COUMADIN) 2.5 MG tablet Take 1 tablet (2.5 mg total) by mouth every Monday, Wednesday, and Friday. 45 tablet 1  . warfarin (COUMADIN) 5 MG tablet Take daily as directed by [facility]. 90 tablet 1   No current facility-administered medications for this visit.    Lab Results: No results found for this or any previous visit (from the past 48 hour(s)).  Physical Findings: AIMS:  , ,  ,  ,    CIWA:    COWS:     Musculoskeletal: Strength & Muscle Tone: within normal limits Gait & Station: normal Patient leans: N/A  Psychiatric Specialty Exam: ROS  Blood pressure 118/82, pulse 82, temperature 97.2 F (36.2 C), temperature source Tympanic, height 4' 10.5" (1.486 m), weight 66.044 kg (145 lb 9.6 oz), SpO2 95 %.Body mass index is 29.91 kg/(m^2).  General Appearance: Casual  Eye Contact::  Good  Speech:  Clear and Coherent  Volume:  Normal  Mood:  Dysphoric  Affect:  Restricted  Thought Process:  Logical  Orientation:  Full (Time, Place, and Person)  Thought Content:  Negative  Suicidal Thoughts:  No  Homicidal Thoughts:  No  Memory:  Immediate;   Fair Recent;    Fair Remote;   Fair  Judgement:  Fair  Insight:  Fair  Psychomotor Activity:  Normal  Concentration:  Good  Recall:  Good  Fund of Knowledge:Good  Language: Good  Akathisia:  No  Handed:  Right  AIMS (if indicated):     Assets:  Architect Leisure Time Physical Health  ADL's:  Intact  Cognition: WNL  Sleep:      Treatment Plan Summary: Medication management and Plan no change to prescription for bupropion. Patient does not think the mirtazapine was of any benefit so we will discontinue it at her request. No indication that I can see to add anything else for a sleep medicine. Supportive counseling done. Follow-up 6 months.  Chryl Holten 05/07/2015, 7:57 PM

## 2015-05-10 ENCOUNTER — Ambulatory Visit (INDEPENDENT_AMBULATORY_CARE_PROVIDER_SITE_OTHER): Payer: Commercial Managed Care - HMO | Admitting: *Deleted

## 2015-05-10 DIAGNOSIS — Z86718 Personal history of other venous thrombosis and embolism: Secondary | ICD-10-CM | POA: Diagnosis not present

## 2015-05-10 DIAGNOSIS — I739 Peripheral vascular disease, unspecified: Secondary | ICD-10-CM

## 2015-05-10 DIAGNOSIS — Z5181 Encounter for therapeutic drug level monitoring: Secondary | ICD-10-CM | POA: Diagnosis not present

## 2015-05-10 LAB — POCT INR: INR: 4.8

## 2015-05-10 NOTE — Progress Notes (Signed)
Pre visit review using our clinic review tool, if applicable. No additional management support is needed unless otherwise documented below in the visit note. 

## 2015-05-24 ENCOUNTER — Ambulatory Visit: Payer: Commercial Managed Care - HMO

## 2015-05-24 ENCOUNTER — Encounter: Payer: Self-pay | Admitting: Family Medicine

## 2015-05-24 ENCOUNTER — Ambulatory Visit (INDEPENDENT_AMBULATORY_CARE_PROVIDER_SITE_OTHER): Payer: Commercial Managed Care - HMO | Admitting: *Deleted

## 2015-05-24 ENCOUNTER — Ambulatory Visit (INDEPENDENT_AMBULATORY_CARE_PROVIDER_SITE_OTHER): Payer: Commercial Managed Care - HMO | Admitting: Family Medicine

## 2015-05-24 VITALS — BP 128/78 | HR 67 | Temp 97.5°F | Wt 147.0 lb

## 2015-05-24 DIAGNOSIS — Z86718 Personal history of other venous thrombosis and embolism: Secondary | ICD-10-CM

## 2015-05-24 DIAGNOSIS — R300 Dysuria: Secondary | ICD-10-CM | POA: Diagnosis not present

## 2015-05-24 DIAGNOSIS — I739 Peripheral vascular disease, unspecified: Secondary | ICD-10-CM

## 2015-05-24 DIAGNOSIS — Z5181 Encounter for therapeutic drug level monitoring: Secondary | ICD-10-CM | POA: Diagnosis not present

## 2015-05-24 LAB — POCT URINALYSIS DIPSTICK
GLUCOSE UA: NEGATIVE
Ketones, UA: NEGATIVE
Leukocytes, UA: NEGATIVE
NITRITE UA: NEGATIVE
Protein, UA: NEGATIVE
RBC UA: NEGATIVE
Spec Grav, UA: >=1.03
UROBILINOGEN UA: 0.2
pH, UA: 6

## 2015-05-24 LAB — POCT INR: INR: 2.1

## 2015-05-24 NOTE — Progress Notes (Signed)
Pre visit review using our clinic review tool, if applicable. No additional management support is needed unless otherwise documented below in the visit note. 

## 2015-05-24 NOTE — Progress Notes (Signed)
BP 128/78 mmHg  Pulse 67  Temp(Src) 97.5 F (36.4 C) (Oral)  Wt 147 lb (66.679 kg)  SpO2 98%   CC: ?UTI  Subjective:    Patient ID: Kathryn Mathews, female    DOB: 01/06/1946, 69 y.o.   MRN: [id number]  HPI: Kathryn Mathews is a 69 y.o. female presenting on 05/24/2015 for Possible UTI and Dysuria   Several day h/o dysuria with urination and burning with wiping. + incomplete emptying (chronic issue). Some dribbling accidents. Has not noticed any rashes. No urgency, frequency, hematuria. No fevers, abd pain, nausea/vomiting, flank pain. No diarrhea. No blood in stool. + abd discomfort attributed to constipation (improved with 6 stool softeners).  Actually today sxs have resolved.  Melatonin has not helped sleep. Now off remeron per psych.    Relevant past medical, surgical, family and social history reviewed and updated as indicated. Interim medical history since our last visit reviewed. Allergies and medications reviewed and updated. Current Outpatient Prescriptions on File Prior to Visit  Medication Sig  . acetaminophen (TYLENOL) 500 MG tablet Take 500 mg by mouth every 8 (eight) hours as needed for moderate pain.  Marland Kitchen atorvastatin (LIPITOR) 10 MG tablet Take 1 tablet (10 mg total) by mouth at bedtime.  . Biotin 1000 MCG tablet Take 1,000 mcg by mouth 2 (two) times daily.    Marland Kitchen buPROPion (WELLBUTRIN SR) 200 MG 12 hr tablet Take 1 tablet (200 mg total) by mouth 2 (two) times daily.  . Calcium Carb-Cholecalciferol (CALCIUM-VITAMIN D) 600-400 MG-UNIT TABS Take 1 tablet by mouth daily.  . Cholecalciferol (VITAMIN D) 2000 UNITS CAPS Take 1 capsule (2,000 Units total) by mouth daily.  . clonazePAM (KLONOPIN) 1 MG tablet Take 1 tablet (1 mg total) by mouth 2 (two) times daily.  . clotrimazole-betamethasone (LOTRISONE) cream Apply 1 application topically 2 (two) times daily.  Marland Kitchen lamoTRIgine (LAMICTAL) 100 MG tablet Take 0.5 tablets (50 mg total) by mouth 2 (two) times daily.  Marland Kitchen levothyroxine  (SYNTHROID, LEVOTHROID) 50 MCG tablet TAKE 1 TABLET (50 MCG TOTAL) BY MOUTH DAILY.  . Melatonin 1 MG CAPS Take 1 capsule by mouth at bedtime.   . metoprolol tartrate (LOPRESSOR) 25 MG tablet Take 0.5 tablets (12.5 mg total) by mouth 2 (two) times daily with a meal.  . warfarin (COUMADIN) 2.5 MG tablet Take 1 tablet (2.5 mg total) by mouth every Monday, Wednesday, and Friday.  . warfarin (COUMADIN) 5 MG tablet Take daily as directed by [facility].   No current facility-administered medications on file prior to visit.    Review of Systems Per HPI unless specifically indicated in ROS section     Objective:    BP 128/78 mmHg  Pulse 67  Temp(Src) 97.5 F (36.4 C) (Oral)  Wt 147 lb (66.679 kg)  SpO2 98%  Wt Readings from Last 3 Encounters:  05/24/15 147 lb (66.679 kg)  05/07/15 145 lb 9.6 oz (66.044 kg)  04/12/15 146 lb (66.225 kg)    Physical Exam  Constitutional: She appears well-developed and well-nourished. No distress.  Abdominal: Soft. Normal appearance and bowel sounds are normal. She exhibits no distension and no mass. There is no hepatosplenomegaly. There is tenderness (mild) in the right lower quadrant. There is no rigidity, no rebound, no guarding, no CVA tenderness and negative Murphy's sign.  Musculoskeletal: She exhibits no edema.  Skin: Skin is warm and dry. No rash noted.  Nursing note and vitals reviewed.  Results for orders placed or performed in visit on  05/24/15  POCT urinalysis dipstick  Result Value Ref Range   Color, UA Dark Yellow    Clarity, UA Hazy    Glucose, UA Negative    Bilirubin, UA Small    Ketones, UA Negative    Spec Grav, UA >=1.030    Blood, UA Negative    pH, UA 6.0    Protein, UA Negative    Urobilinogen, UA 0.2    Nitrite, UA Negative    Leukocytes, UA Negative Negative      Assessment & Plan:   Problem List Items Addressed This Visit    Dysuria - Primary    sxs have cleared up. UA clear but concentrated today with  bilirubin. Encouraged avoiding bladder irritants and increased water intake. Update if persistent or recurrent sxs. Lab Results  Component Value Date   ALT 12 11/17/2014   AST 22 11/17/2014   ALKPHOS 65 11/17/2014   BILITOT 0.4 11/17/2014        Relevant Orders   POCT urinalysis dipstick (Completed)       Follow up plan: Return if symptoms worsen or fail to improve.

## 2015-05-24 NOTE — Patient Instructions (Addendum)
Urine looking ok today - but concentrated. Possibly just bladder irritation - avoid spicy foods like you're doing and dark sodas (caffeinated beverages). Increase plain water intake throughout the day. Let us know if recurrent symptoms.

## 2015-05-24 NOTE — Assessment & Plan Note (Signed)
sxs have cleared up. UA clear but concentrated today with bilirubin. Encouraged avoiding bladder irritants and increased water intake. Update if persistent or recurrent sxs. Lab Results  Component Value Date   ALT 12 11/17/2014   AST 22 11/17/2014   ALKPHOS 65 11/17/2014   BILITOT 0.4 11/17/2014

## 2015-06-14 ENCOUNTER — Other Ambulatory Visit: Payer: Self-pay | Admitting: *Deleted

## 2015-06-14 MED ORDER — WARFARIN SODIUM 5 MG PO TABS: ORAL_TABLET | ORAL | Status: DC

## 2015-06-16 NOTE — Assessment & Plan Note (Addendum)
PAD was managed during 2015. Pt continued statin therapy. Not on aspirin due to bleed risk (already on coumadin)

## 2015-06-16 NOTE — Addendum Note (Signed)
Addended by: Eustaquio BoydenGUTIERREZ, Charly Hunton on: 06/16/2015 11:16 AM   Modules accepted: Kipp BroodSmartSet

## 2015-06-21 ENCOUNTER — Ambulatory Visit (INDEPENDENT_AMBULATORY_CARE_PROVIDER_SITE_OTHER): Payer: Commercial Managed Care - HMO | Admitting: *Deleted

## 2015-06-21 DIAGNOSIS — Z5181 Encounter for therapeutic drug level monitoring: Secondary | ICD-10-CM | POA: Diagnosis not present

## 2015-06-21 DIAGNOSIS — I739 Peripheral vascular disease, unspecified: Secondary | ICD-10-CM | POA: Diagnosis not present

## 2015-06-21 DIAGNOSIS — Z86718 Personal history of other venous thrombosis and embolism: Secondary | ICD-10-CM

## 2015-06-21 LAB — POCT INR: INR: 3.3

## 2015-06-21 NOTE — Progress Notes (Signed)
Pre visit review using our clinic review tool, if applicable. No additional management support is needed unless otherwise documented below in the visit note. 

## 2015-07-19 ENCOUNTER — Ambulatory Visit (INDEPENDENT_AMBULATORY_CARE_PROVIDER_SITE_OTHER): Payer: Commercial Managed Care - HMO | Admitting: *Deleted

## 2015-07-19 DIAGNOSIS — I739 Peripheral vascular disease, unspecified: Secondary | ICD-10-CM | POA: Diagnosis not present

## 2015-07-19 DIAGNOSIS — Z5181 Encounter for therapeutic drug level monitoring: Secondary | ICD-10-CM | POA: Diagnosis not present

## 2015-07-19 DIAGNOSIS — Z86718 Personal history of other venous thrombosis and embolism: Secondary | ICD-10-CM | POA: Diagnosis not present

## 2015-07-19 LAB — POCT INR: INR: 4.1

## 2015-07-19 NOTE — Progress Notes (Signed)
Pre visit review using our clinic review tool, if applicable. No additional management support is needed unless otherwise documented below in the visit note. 

## 2015-08-09 ENCOUNTER — Ambulatory Visit (INDEPENDENT_AMBULATORY_CARE_PROVIDER_SITE_OTHER): Payer: Commercial Managed Care - HMO | Admitting: *Deleted

## 2015-08-09 DIAGNOSIS — I739 Peripheral vascular disease, unspecified: Secondary | ICD-10-CM

## 2015-08-09 DIAGNOSIS — Z5181 Encounter for therapeutic drug level monitoring: Secondary | ICD-10-CM

## 2015-08-09 DIAGNOSIS — Z86718 Personal history of other venous thrombosis and embolism: Secondary | ICD-10-CM

## 2015-08-09 LAB — POCT INR: INR: 2.9

## 2015-08-09 NOTE — Progress Notes (Signed)
INR is back to goal today.  Will continue on new dose, 1 tablet daily except 1.5 tablets on Mondays and Fridays.  Patient verbalizes understanding.

## 2015-08-09 NOTE — Progress Notes (Signed)
Pre visit review using our clinic review tool, if applicable. No additional management support is needed unless otherwise documented below in the visit note. 

## 2015-08-19 ENCOUNTER — Other Ambulatory Visit: Payer: Self-pay | Admitting: Family Medicine

## 2015-08-19 ENCOUNTER — Other Ambulatory Visit: Payer: Self-pay | Admitting: Psychiatry

## 2015-09-06 ENCOUNTER — Ambulatory Visit (INDEPENDENT_AMBULATORY_CARE_PROVIDER_SITE_OTHER): Payer: Commercial Managed Care - HMO | Admitting: *Deleted

## 2015-09-06 DIAGNOSIS — I739 Peripheral vascular disease, unspecified: Secondary | ICD-10-CM | POA: Diagnosis not present

## 2015-09-06 DIAGNOSIS — Z5181 Encounter for therapeutic drug level monitoring: Secondary | ICD-10-CM

## 2015-09-06 DIAGNOSIS — Z86718 Personal history of other venous thrombosis and embolism: Secondary | ICD-10-CM | POA: Diagnosis not present

## 2015-09-06 LAB — POCT INR: INR: 2.3

## 2015-09-06 NOTE — Progress Notes (Signed)
Pre visit review using our clinic review tool, if applicable. No additional management support is needed unless otherwise documented below in the visit note. 

## 2015-10-01 ENCOUNTER — Other Ambulatory Visit: Payer: Self-pay | Admitting: Family Medicine

## 2015-10-01 ENCOUNTER — Telehealth: Payer: Self-pay

## 2015-10-01 NOTE — Telephone Encounter (Signed)
Pt left v/m requesting cb about labs. Left v/m requesting cb.

## 2015-10-01 NOTE — Telephone Encounter (Signed)
Ok to refill 

## 2015-10-04 ENCOUNTER — Ambulatory Visit (INDEPENDENT_AMBULATORY_CARE_PROVIDER_SITE_OTHER): Payer: Commercial Managed Care - HMO | Admitting: *Deleted

## 2015-10-04 DIAGNOSIS — I739 Peripheral vascular disease, unspecified: Secondary | ICD-10-CM | POA: Diagnosis not present

## 2015-10-04 DIAGNOSIS — Z5181 Encounter for therapeutic drug level monitoring: Secondary | ICD-10-CM

## 2015-10-04 DIAGNOSIS — Z86718 Personal history of other venous thrombosis and embolism: Secondary | ICD-10-CM | POA: Diagnosis not present

## 2015-10-04 LAB — POCT INR: INR: 2.5

## 2015-10-04 NOTE — Progress Notes (Signed)
Pre visit review using our clinic review tool, if applicable. No additional management support is needed unless otherwise documented below in the visit note. 

## 2015-10-11 ENCOUNTER — Ambulatory Visit (INDEPENDENT_AMBULATORY_CARE_PROVIDER_SITE_OTHER)
Admission: RE | Admit: 2015-10-11 | Discharge: 2015-10-11 | Disposition: A | Discharge status: Home / Self Care | Payer: Commercial Managed Care - HMO | Source: Ambulatory Visit | Attending: Family Medicine | Admitting: Family Medicine

## 2015-10-11 ENCOUNTER — Encounter: Payer: Self-pay | Admitting: Family Medicine

## 2015-10-11 ENCOUNTER — Ambulatory Visit (INDEPENDENT_AMBULATORY_CARE_PROVIDER_SITE_OTHER): Payer: Commercial Managed Care - HMO | Admitting: Family Medicine

## 2015-10-11 ENCOUNTER — Other Ambulatory Visit: Payer: Self-pay | Admitting: Family Medicine

## 2015-10-11 VITALS — BP 124/80 | HR 70 | Temp 98.0°F | Wt 143.8 lb

## 2015-10-11 DIAGNOSIS — Z1159 Encounter for screening for other viral diseases: Secondary | ICD-10-CM

## 2015-10-11 DIAGNOSIS — G4700 Insomnia, unspecified: Secondary | ICD-10-CM

## 2015-10-11 DIAGNOSIS — M25552 Pain in left hip: Secondary | ICD-10-CM | POA: Insufficient documentation

## 2015-10-11 DIAGNOSIS — M8580 Other specified disorders of bone density and structure, unspecified site: Secondary | ICD-10-CM | POA: Diagnosis not present

## 2015-10-11 NOTE — Progress Notes (Signed)
Pre visit review using our clinic review tool, if applicable. No additional management support is needed unless otherwise documented below in the visit note. 

## 2015-10-11 NOTE — Assessment & Plan Note (Addendum)
Discussed latest dexa and cal/vit D use.

## 2015-10-11 NOTE — Assessment & Plan Note (Signed)
Reviewed poor sleep hygiene with patient - she reads mystery novels for several hours at bedtime. Discussed this is mentally stimulating and counterproductive to resting at night time. Encouraged she read at other time other than bedtime

## 2015-10-11 NOTE — Patient Instructions (Addendum)
labwork today. Work on bedtime routine as we discussed.  Work on posture and straight back with walking. For hip pain - may continue tylenol as up to now.  Try exercises provided today for left hip bursitis. Xray of hips today.

## 2015-10-11 NOTE — Progress Notes (Signed)
BP 124/80 mmHg  Pulse 70  Temp(Src) 98 F (36.7 C) (Oral)  Wt 143 lb 12 oz (65.205 kg)  SpO2 95%   CC:  6 mo f/u visit Subjective:    Patient ID: Kathryn Mathews Mcdowell, female    DOB: 1945/09/30, 70 y.o.   MRN: [id number]  HPI: Kathryn Mathews Goethe is a 70 y.o. female presenting on 10/11/2015 for Follow-up   Main concern is sleep trouble. Continues struggling to sleep at night. Bedtime 10pm, reads mystery novels for several hours, falls asleep at 3am. Wakes up at 1pm. Uses sound machine. Sleeps in lazy boy recliner.   Worsening hip pain with walking - points to inner groin. Takes tylenol for pain. Ongoing for months. No pain if remains inactive.   Requests hep c screen today.   Relevant past medical, surgical, family and social history reviewed and updated as indicated. Interim medical history since our last visit reviewed. Allergies and medications reviewed and updated. Current Outpatient Prescriptions on File Prior to Visit  Medication Sig  . acetaminophen (TYLENOL) 500 MG tablet Take 500 mg by mouth every 8 (eight) hours as needed for moderate pain.  Marland Kitchen. atorvastatin (LIPITOR) 10 MG tablet TAKE 1 TABLET AT BEDTIME  . Biotin 1000 MCG tablet Take 1,000 mcg by mouth 2 (two) times daily.    Marland Kitchen. buPROPion (WELLBUTRIN SR) 200 MG 12 hr tablet TAKE 1 TABLET TWICE DAILY  . Calcium Carb-Cholecalciferol (CALCIUM-VITAMIN D) 600-400 MG-UNIT TABS Take 1 tablet by mouth daily.  . Cholecalciferol (VITAMIN D) 2000 UNITS CAPS Take 1 capsule (2,000 Units total) by mouth daily.  . clonazePAM (KLONOPIN) 1 MG tablet Take 1 tablet (1 mg total) by mouth 2 (two) times daily.  . clotrimazole-betamethasone (LOTRISONE) cream Apply 1 application topically 2 (two) times daily.  Tery Sanfilippo. Docusate Calcium (STOOL SOFTENER PO) Take by mouth daily as needed.  . lamoTRIgine (LAMICTAL) 100 MG tablet Take 0.5 tablets (50 mg total) by mouth 2 (two) times daily.  Marland Kitchen. levothyroxine (SYNTHROID, LEVOTHROID) 50 MCG tablet TAKE 1 TABLET EVERY  DAY  . Melatonin 1 MG CAPS Take 1 capsule by mouth at bedtime.   . metoprolol tartrate (LOPRESSOR) 25 MG tablet Take 0.5 tablets (12.5 mg total) by mouth 2 (two) times daily with a meal.  . warfarin (COUMADIN) 5 MG tablet TAKE DAILY AS DIRECTED BY COUMADIN CLINIC.   No current facility-administered medications on file prior to visit.    Review of Systems Per HPI unless specifically indicated in ROS section     Objective:    BP 124/80 mmHg  Pulse 70  Temp(Src) 98 F (36.7 C) (Oral)  Wt 143 lb 12 oz (65.205 kg)  SpO2 95%  Wt Readings from Last 3 Encounters:  10/11/15 143 lb 12 oz (65.205 kg)  05/24/15 147 lb (66.679 kg)  05/07/15 145 lb 9.6 oz (66.044 kg)    Physical Exam  Constitutional: She appears well-developed and well-nourished. No distress.  Musculoskeletal: She exhibits no edema.  Pain at lateral left hip with internal rotation  Tender to palpation at GTB bilaterally  Skin: Skin is warm and dry. No rash noted.  Psychiatric: She has a normal mood and affect.  Nursing note and vitals reviewed.  Results for orders placed or performed in visit on 10/04/15  POCT INR  Result Value Ref Range   INR 2.5       Assessment & Plan:   Problem List Items Addressed This Visit    Osteopenia    Discussed latest dexa  and cal/vit D use.      Insomnia    Reviewed poor sleep hygiene with patient - she reads mystery novels for several hours at bedtime. Discussed this is mentally stimulating and counterproductive to resting at night time. Encouraged she read at other time other than bedtime      Left hip pain - Primary    Anticipate trochanteric bursitis - provided with exercises for this. Continue tylenol (avoid NSAIDs due to coumadin use) She also points to inner groin as site of hip pain - ?joint arthritis. Check baseline hip xrays today.      Relevant Orders   DG HIP UNILAT W OR W/O PELVIS 2-3 VIEWS LEFT    Other Visit Diagnoses    Need for hepatitis C screening test         Relevant Orders    Hepatitis C antibody, reflex        Follow up plan: Return if symptoms worsen or fail to improve.

## 2015-10-11 NOTE — Assessment & Plan Note (Signed)
Anticipate trochanteric bursitis - provided with exercises for this. Continue tylenol (avoid NSAIDs due to coumadin use) She also points to inner groin as site of hip pain - ?joint arthritis. Check baseline hip xrays today.

## 2015-10-12 LAB — HEPATITIS C ANTIBODY: HCV Ab: NEGATIVE

## 2015-10-25 ENCOUNTER — Other Ambulatory Visit: Payer: Self-pay | Admitting: Psychiatry

## 2015-10-25 ENCOUNTER — Other Ambulatory Visit: Payer: Self-pay

## 2015-10-25 NOTE — Telephone Encounter (Signed)
pt called states she almost out of her medication klonopin1mg  pt next appt is 11-05-15

## 2015-10-27 ENCOUNTER — Observation Stay (HOSPITAL_BASED_OUTPATIENT_CLINIC_OR_DEPARTMENT_OTHER): Payer: Commercial Managed Care - HMO

## 2015-10-27 ENCOUNTER — Encounter (HOSPITAL_COMMUNITY): Payer: Self-pay | Admitting: *Deleted

## 2015-10-27 ENCOUNTER — Emergency Department (HOSPITAL_COMMUNITY): Payer: Commercial Managed Care - HMO

## 2015-10-27 ENCOUNTER — Observation Stay (HOSPITAL_COMMUNITY): Payer: Commercial Managed Care - HMO

## 2015-10-27 ENCOUNTER — Observation Stay (HOSPITAL_COMMUNITY)
Admission: EM | Admit: 2015-10-27 | Discharge: 2015-10-27 | Disposition: A | Discharge status: Home / Self Care | Payer: Commercial Managed Care - HMO | Attending: Family Medicine | Admitting: Family Medicine

## 2015-10-27 DIAGNOSIS — R2990 Unspecified symptoms and signs involving the nervous system: Secondary | ICD-10-CM

## 2015-10-27 DIAGNOSIS — Z7901 Long term (current) use of anticoagulants: Secondary | ICD-10-CM | POA: Diagnosis not present

## 2015-10-27 DIAGNOSIS — Z8673 Personal history of transient ischemic attack (TIA), and cerebral infarction without residual deficits: Secondary | ICD-10-CM | POA: Diagnosis present

## 2015-10-27 DIAGNOSIS — M7989 Other specified soft tissue disorders: Secondary | ICD-10-CM | POA: Insufficient documentation

## 2015-10-27 DIAGNOSIS — Z86711 Personal history of pulmonary embolism: Secondary | ICD-10-CM | POA: Diagnosis not present

## 2015-10-27 DIAGNOSIS — I639 Cerebral infarction, unspecified: Secondary | ICD-10-CM

## 2015-10-27 DIAGNOSIS — Z86718 Personal history of other venous thrombosis and embolism: Secondary | ICD-10-CM | POA: Diagnosis not present

## 2015-10-27 DIAGNOSIS — F419 Anxiety disorder, unspecified: Secondary | ICD-10-CM | POA: Insufficient documentation

## 2015-10-27 DIAGNOSIS — R5381 Other malaise: Secondary | ICD-10-CM | POA: Insufficient documentation

## 2015-10-27 DIAGNOSIS — G459 Transient cerebral ischemic attack, unspecified: Secondary | ICD-10-CM | POA: Diagnosis not present

## 2015-10-27 DIAGNOSIS — E039 Hypothyroidism, unspecified: Secondary | ICD-10-CM | POA: Diagnosis not present

## 2015-10-27 DIAGNOSIS — Z6831 Body mass index (BMI) 31.0-31.9, adult: Secondary | ICD-10-CM | POA: Insufficient documentation

## 2015-10-27 DIAGNOSIS — E669 Obesity, unspecified: Secondary | ICD-10-CM | POA: Insufficient documentation

## 2015-10-27 DIAGNOSIS — R202 Paresthesia of skin: Secondary | ICD-10-CM | POA: Diagnosis not present

## 2015-10-27 DIAGNOSIS — I6340 Cerebral infarction due to embolism of unspecified cerebral artery: Secondary | ICD-10-CM | POA: Insufficient documentation

## 2015-10-27 DIAGNOSIS — I10 Essential (primary) hypertension: Secondary | ICD-10-CM | POA: Diagnosis present

## 2015-10-27 DIAGNOSIS — I6330 Cerebral infarction due to thrombosis of unspecified cerebral artery: Secondary | ICD-10-CM | POA: Insufficient documentation

## 2015-10-27 DIAGNOSIS — F319 Bipolar disorder, unspecified: Secondary | ICD-10-CM | POA: Diagnosis not present

## 2015-10-27 DIAGNOSIS — I609 Nontraumatic subarachnoid hemorrhage, unspecified: Secondary | ICD-10-CM | POA: Insufficient documentation

## 2015-10-27 DIAGNOSIS — E785 Hyperlipidemia, unspecified: Secondary | ICD-10-CM | POA: Diagnosis not present

## 2015-10-27 DIAGNOSIS — K219 Gastro-esophageal reflux disease without esophagitis: Secondary | ICD-10-CM | POA: Diagnosis not present

## 2015-10-27 DIAGNOSIS — R29818 Other symptoms and signs involving the nervous system: Secondary | ICD-10-CM | POA: Diagnosis not present

## 2015-10-27 DIAGNOSIS — R200 Anesthesia of skin: Secondary | ICD-10-CM | POA: Diagnosis not present

## 2015-10-27 HISTORY — DX: Other specified abnormal immunological findings in serum: R76.8

## 2015-10-27 LAB — ETHANOL: Alcohol, Ethyl (B): <5 mg/dL (ref <5)

## 2015-10-27 LAB — APTT
APTT: 59 s — AB (ref 24–37)
aPTT: 55 seconds — ABNORMAL HIGH (ref 24–37)

## 2015-10-27 LAB — COMPREHENSIVE METABOLIC PANEL
ALT: 14 U/L (ref 14–54)
AST: 24 U/L (ref 15–41)
Albumin: 3.7 g/dL (ref 3.5–5.0)
Alkaline Phosphatase: 54 U/L (ref 38–126)
Anion gap: 11 (ref 5–15)
BILIRUBIN TOTAL: 0.4 mg/dL (ref 0.3–1.2)
BUN: 17 mg/dL (ref 6–20)
CALCIUM: 9.5 mg/dL (ref 8.9–10.3)
CHLORIDE: 106 mmol/L (ref 101–111)
CO2: 25 mmol/L (ref 22–32)
CREATININE: 0.9 mg/dL (ref 0.44–1.00)
Glucose, Bld: 103 mg/dL — ABNORMAL HIGH (ref 65–99)
Potassium: 4.4 mmol/L (ref 3.5–5.1)
Sodium: 142 mmol/L (ref 135–145)
TOTAL PROTEIN: 6.7 g/dL (ref 6.5–8.1)

## 2015-10-27 LAB — DIFFERENTIAL
BASOS ABS: 0 10*3/uL (ref 0.0–0.1)
BASOS PCT: 0 %
Eosinophils Absolute: 0.1 10*3/uL (ref 0.0–0.7)
Eosinophils Relative: 1 %
LYMPHS ABS: 2.1 10*3/uL (ref 0.7–4.0)
Lymphocytes Relative: 32 %
MONO ABS: 0.4 10*3/uL (ref 0.1–1.0)
MONOS PCT: 7 %
NEUTROS ABS: 3.9 10*3/uL (ref 1.7–7.7)
Neutrophils Relative %: 60 %

## 2015-10-27 LAB — RAPID URINE DRUG SCREEN, HOSP PERFORMED
Amphetamines: NOT DETECTED
BARBITURATES: NOT DETECTED
Benzodiazepines: NOT DETECTED
Cocaine: NOT DETECTED
Opiates: NOT DETECTED
Tetrahydrocannabinol: NOT DETECTED

## 2015-10-27 LAB — CBC
HEMATOCRIT: 41.8 % (ref 36.0–46.0)
HEMOGLOBIN: 13.4 g/dL (ref 12.0–15.0)
MCH: 30.9 pg (ref 26.0–34.0)
MCHC: 32.1 g/dL (ref 30.0–36.0)
MCV: 96.3 fL (ref 78.0–100.0)
Platelets: 152 10*3/uL (ref 150–400)
RBC: 4.34 MIL/uL (ref 3.87–5.11)
RDW: 13.3 % (ref 11.5–15.5)
WBC: 6.5 10*3/uL (ref 4.0–10.5)

## 2015-10-27 LAB — I-STAT CHEM 8, ED
BUN: 20 mg/dL (ref 6–20)
CALCIUM ION: 1.19 mmol/L (ref 1.13–1.30)
CREATININE: 0.7 mg/dL (ref 0.44–1.00)
Chloride: 106 mmol/L (ref 101–111)
GLUCOSE: 101 mg/dL — AB (ref 65–99)
HEMATOCRIT: 46 % (ref 36.0–46.0)
Hemoglobin: 15.6 g/dL — ABNORMAL HIGH (ref 12.0–15.0)
Potassium: 4.5 mmol/L (ref 3.5–5.1)
Sodium: 143 mmol/L (ref 135–145)
TCO2: 23 mmol/L (ref 0–100)

## 2015-10-27 LAB — URINALYSIS, ROUTINE W REFLEX MICROSCOPIC
Bilirubin Urine: NEGATIVE
Glucose, UA: NEGATIVE mg/dL
Ketones, ur: NEGATIVE mg/dL
LEUKOCYTES UA: NEGATIVE
NITRITE: NEGATIVE
PROTEIN: NEGATIVE mg/dL
SPECIFIC GRAVITY, URINE: 1.008 (ref 1.005–1.030)
pH: 5.5 (ref 5.0–8.0)

## 2015-10-27 LAB — URINE MICROSCOPIC-ADD ON
Bacteria, UA: NONE SEEN
WBC UA: NONE SEEN WBC/hpf (ref 0–5)

## 2015-10-27 LAB — PROTIME-INR
INR: 3.28 — ABNORMAL HIGH (ref 0.00–1.49)
INR: 3.75 — AB (ref 0.00–1.49)
PROTHROMBIN TIME: 36.2 s — AB (ref 11.6–15.2)
Prothrombin Time: 32.8 seconds — ABNORMAL HIGH (ref 11.6–15.2)

## 2015-10-27 LAB — VITAMIN B12: Vitamin B-12: 518 pg/mL (ref 180–914)

## 2015-10-27 LAB — ECHOCARDIOGRAM COMPLETE
HEIGHTINCHES: 57 in
WEIGHTICAEL: 2310.4 [oz_av]

## 2015-10-27 LAB — TSH: TSH: 3.112 u[IU]/mL (ref 0.350–4.500)

## 2015-10-27 LAB — CBG MONITORING, ED: Glucose-Capillary: 88 mg/dL (ref 65–99)

## 2015-10-27 LAB — I-STAT TROPONIN, ED: TROPONIN I, POC: 0 ng/mL (ref 0.00–0.08)

## 2015-10-27 LAB — TROPONIN I

## 2015-10-27 MED ORDER — CLONAZEPAM 1 MG PO TABS
1.0000 mg | ORAL_TABLET | Freq: Two times a day (BID) | ORAL | Status: DC
  Administered 2015-10-27: 1 mg via ORAL
  Filled 2015-10-27: qty 1

## 2015-10-27 MED ORDER — BUPROPION HCL ER (SR) 100 MG PO TB12
200.0000 mg | ORAL_TABLET | Freq: Two times a day (BID) | ORAL | Status: DC
  Administered 2015-10-27: 200 mg via ORAL
  Filled 2015-10-27 (×2): qty 2

## 2015-10-27 MED ORDER — SODIUM CHLORIDE 0.9 % IV SOLN
INTRAVENOUS | Status: DC
  Administered 2015-10-27: 09:00:00 via INTRAVENOUS

## 2015-10-27 MED ORDER — WARFARIN - PHARMACIST DOSING INPATIENT: Freq: Every day | Status: DC

## 2015-10-27 MED ORDER — LAMOTRIGINE 100 MG PO TABS
50.0000 mg | ORAL_TABLET | Freq: Two times a day (BID) | ORAL | Status: DC
  Administered 2015-10-27: 50 mg via ORAL
  Filled 2015-10-27: qty 1

## 2015-10-27 MED ORDER — ACETAMINOPHEN 650 MG RE SUPP: 650.0000 mg | RECTAL | Status: DC | PRN

## 2015-10-27 MED ORDER — ATORVASTATIN CALCIUM 40 MG PO TABS: 40.0000 mg | ORAL_TABLET | Freq: Every day | ORAL | Status: DC

## 2015-10-27 MED ORDER — ACETAMINOPHEN 325 MG PO TABS
650.0000 mg | ORAL_TABLET | ORAL | Status: DC | PRN
  Administered 2015-10-27: 650 mg via ORAL
  Filled 2015-10-27: qty 2

## 2015-10-27 MED ORDER — SENNOSIDES-DOCUSATE SODIUM 8.6-50 MG PO TABS: 1.0000 | ORAL_TABLET | Freq: Every evening | ORAL | Status: DC | PRN

## 2015-10-27 MED ORDER — ATORVASTATIN CALCIUM 10 MG PO TABS: 10.0000 mg | ORAL_TABLET | Freq: Every day | ORAL | Status: DC

## 2015-10-27 MED ORDER — ASPIRIN 300 MG RE SUPP: 300.0000 mg | Freq: Every day | RECTAL | Status: DC

## 2015-10-27 MED ORDER — ASPIRIN 325 MG PO TABS: 325.0000 mg | ORAL_TABLET | Freq: Every day | ORAL | Status: DC

## 2015-10-27 NOTE — Discharge Instructions (Addendum)
Follow up with [facility] As Scheduled  Transient Ischemic Attack A transient ischemic attack (TIA) is a "warning stroke" that causes stroke-like symptoms. A TIA does not cause lasting damage to the brain. The symptoms of a TIA can happen fast and do not last long. It is important to know the symptoms of a TIA and what to do. This can help prevent stroke or death.  HOME CARE   Take medicines only as told by your doctor. Make sure you understand all of the instructions.  You may need to take aspirin or warfarin medicine. Warfarin needs to be taken exactly as told.  Taking too much or too little warfarin is dangerous. Blood tests must be done as often as told by your doctor. A PT blood test measures how long it takes for blood to clot. Your PT is used to calculate another value called an INR. Your PT and INR help your doctor adjust your warfarin dosage. He or she will make sure you are taking the right amount.  Food can cause problems with warfarin and affect the results of your blood tests. This is true for foods high in vitamin K. Eat the same amount of foods high in vitamin K each day. Foods high in vitamin K include spinach, kale, broccoli, cabbage, collard and turnip greens, Brussels sprouts, peas, cauliflower, seaweed, and parsley. Other foods high in vitamin K include beef and pork liver, green tea, and soybean oil. Eat the same amount of foods high in vitamin K each day. Avoid big changes in your diet. Tell your doctor before changing your diet. Talk to a food specialist (dietitian) if you have questions.  Many medicines can cause problems with warfarin and affect your PT and INR. Tell your doctor about all medicines you take. This includes vitamins and dietary pills (supplements). Do not take or stop taking any prescribed or over-the-counter medicines unless your doctor tells you to.  Warfarin can cause more bruising or bleeding. Hold pressure over any cuts for longer  than normal. Talk to your doctor about other side effects of warfarin.  Avoid sports or activities that may cause injury or bleeding.  Be careful when you shave, floss, or use sharp objects.  Avoid or drink very little alcohol while taking warfarin. Tell your doctor if you change how much alcohol you drink.  Tell your dentist and other doctors that you take warfarin before any procedures.  Follow your diet program as told, if you are given one.  Keep a healthy weight.  Stay active. Try to get at least 30 minutes of activity on all or most days.  Do not use any tobacco products, including cigarettes, chewing tobacco, or electronic cigarettes. If you need help quitting, ask your doctor.  Limit alcohol intake to no more than 1 drink per day for nonpregnant women and 2 drinks per day for men. One drink equals 12 ounces of beer, 5 ounces of wine, or 1 ounces of hard liquor.  Do not abuse drugs.  Keep your home safe so you do not fall. You can do this by:  Putting grab bars in the bedroom and bathroom.  Raising toilet seats.  Putting a seat in the shower.  Keep all follow-up visits as told by your doctor. This is important. GET HELP IF:  Your personality changes.  You have trouble swallowing.  You have double vision.  You are dizzy.  You have a fever. GET HELP RIGHT AWAY IF:  These symptoms may  be an emergency. Do not wait to see if the symptoms will go away. Get medical help right away. Call your local emergency services (911 in the [place] Do not drive yourself to the hospital.  You have sudden weakness or lose feeling (go numb), especially on one side of the body. This can affect your:  Face.  Arm.  Leg.  You have sudden trouble walking.  You have sudden trouble moving your arms or legs.  You have sudden confusion.  You have trouble talking.  You have trouble understanding.  You have sudden trouble seeing in one or both eyes.  You lose your  balance.  Your movements are not smooth.  You have a sudden, very bad headache with no known cause.  You have new chest pain.  Your heartbeat is unsteady.  You are partly or totally unaware of what is going on around you. MAKE SURE YOU:   Understand these instructions.  Will watch your condition.  Will get help right away if you are not doing well or get worse.   This information is not intended to replace advice given to you by your health care provider. Make sure you discuss any questions you have with your health care provider.   Document Released: 04/08/2008 Document Revised: 07/21/2014 Document Reviewed: 10/05/2013 Elsevier Interactive Patient Education [employer]2016 [employer]vier Inc.

## 2015-10-27 NOTE — Code Documentation (Signed)
Code stroke called at 0603 for this 70 y/o female who was LSW at 0400.A few minutes later she noted LLE, LUE numbness and numbness to tongue and lips. [facility].  She was cleared for CT by Dr Mora Bellmanni at [phone] and arrived in CT scan at 0606.  CBG 88,   BP 158/91.    No acute findings on head CT were called to Dr Mora Bellmanni at [phone].  Pt scored a 1 on the NIHSS  Given for partial sensory loss.  With pt's symptoms mild and improving she remains in the TPA window until 0830 hrs.  She will be admitted by triad.

## 2015-10-27 NOTE — Progress Notes (Signed)
Ready for discharge to home; discharge instructions given and reviewed; discharged home accompanied by her spouse; she will go to her lab for PT/PTT follow up labs on Monday, April 17th.; coumadin on hold at discharge; patient verbalizes her instructions and follow up appointments.

## 2015-10-27 NOTE — ED Provider Notes (Signed)
CSN: [id number]     Arrival date & time 10/27/15  0544 History   First MD Initiated Contact with Patient 10/27/15 [phone]     Chief Complaint  Patient presents with  . Code Stroke    An emergency department physician performed an initial assessment on this suspected stroke patient at 0604. (Consider location/radiation/quality/duration/timing/severity/associated sxs/prior Treatment) HPI   Kathryn Mathews is a 70 y.o. female with past medical history of pulmonary embolism on warfarin, hyperlipidemia, presenting today with sudden onset numbness after waking up around 4 AM. Patient states she lost feeling in her left upper and lower extremity. She also has numbness in her tongue and lips. She denies any weakness. She states her gait has been at her normal baseline. She denies any fevers or recent infections, patient has no further complaints.  10 Systems reviewed and are negative for acute change except as noted in the HPI.     Past Medical History  Diagnosis Date  . High cholesterol   . Anxiety   . Depression   . Humerus fracture   . Hypothyroidism   . Pulmonary embolism (HCC)   . History of chicken pox   . Bipolar disorder (HCC)     no psych  . GERD (gastroesophageal reflux disease)   . HLD (hyperlipidemia)   . Hypothyroid     previously hyperthyroid  . History of DVT (deep vein thrombosis)     on chronic coumadin since 1973, h/o blood clots on coumadin 2011, added ASA QD, but due to fall risk stopped and changed goal INR to 2.5-3.0  . Phlebitis     left leg stays swollen  . PAD (peripheral artery disease) (HCC)   . Alopecia     well controlled per previous PCP - improved with biotin  . Chronic sinusitis   . Glucose intolerance (impaired glucose tolerance)   . Closed right humeral fracture 2011    coracoid nondisplaced, conservative therapy (Dr. Cleon Gustin)  . Closed left humeral fracture 2010    closed left humeral neck fracture, also h/o L wrist fx  . Hyperactivity of bladder    . Osteopenia 2010, 2016    DEXA 2010: T -1.3 L spine, -2.1 femoral neck. DEXA 2016: T -2.4 hip, T -0.5 spine; rpt 2 yrs  . Falls     completed HHPT 10/2011, declined outpt PT  . Anxiety     when around ppl  . Diverticulosis     by colonoscopy   Past Surgical History  Procedure Laterality Date  . Cesarean section    . Left heart catheterization with coronary angiogram N/A 09/05/2014    Procedure: LEFT HEART CATHETERIZATION WITH CORONARY ANGIOGRAM;  Surgeon: Marykay Lex, MD;  Location: [facility] CATH LAB;  Service: Cardiovascular;  Laterality: N/A;  . Cholecystectomy    . Cesarean section  1973  . Arterial thrombectomy  07/2009    with R mid popliteal artery angioplasty [balloon] (2011)[[[[  . Hospitalization  01/2007    depression, ECT  . Inguinal hernia repair  1980s    x2 on right  . Colonoscopy  08/2008    int/ext hem, severe diverticulosis with evidence of itis, rec rpt 10 yrs  . Cardiac catheterization  08/2014    nl EF, small branch OM not amenable to PCI [name])   Family History  Problem Relation Age of Onset  . Heart attack Mother 29  . Cancer Father   . Coronary artery disease Mother 74  . Hyperlipidemia Mother   . Heart  disease Mother   . Hypertension Mother   . Breast cancer Maternal Aunt 54  . Coronary artery disease Maternal Uncle   . Coronary artery disease Maternal Grandmother 70  . Stroke Neg Hx   . Diabetes Neg Hx   . Cancer Neg Hx    Social History  Substance Use Topics  . Smoking status: Never Smoker   . Smokeless tobacco: Never Used  . Alcohol Use: No   OB History    Gravida Para Term Preterm AB TAB SAB Ectopic Multiple Living   0 0 0 0 0 0 0 0       Review of Systems    Allergies  Codeine; Acyclovir and related; and Hydrocodone  Home Medications   Prior to Admission medications   Medication Sig Start Date End Date Taking? Authorizing Provider  acetaminophen (TYLENOL) 500 MG tablet Take 500 mg by mouth every 8 (eight) hours as needed for  moderate pain.   Yes Historical Provider, MD  atorvastatin (LIPITOR) 10 MG tablet TAKE 1 TABLET AT BEDTIME 08/20/15  Yes Eustaquio BoydenJavier Gutierrez, MD  Biotin 1000 MCG tablet Take 1,000 mcg by mouth 2 (two) times daily.     Yes Historical Provider, MD  buPROPion (WELLBUTRIN SR) 200 MG 12 hr tablet TAKE 1 TABLET TWICE DAILY 08/20/15  Yes Audery AmelJohn T Clapacs, MD  Calcium Carb-Cholecalciferol (CALCIUM-VITAMIN D) 600-400 MG-UNIT TABS Take 1 tablet by mouth daily.   Yes Historical Provider, MD  Cholecalciferol (VITAMIN D) 2000 UNITS CAPS Take 1 capsule (2,000 Units total) by mouth daily. 08/05/11  Yes Eustaquio BoydenJavier Gutierrez, MD  clonazePAM (KLONOPIN) 1 MG tablet Take 1 tablet (1 mg total) by mouth 2 (two) times daily. 02/01/15  Yes Audery AmelJohn T Clapacs, MD  Docusate Calcium (STOOL SOFTENER PO) Take 1 tablet by mouth daily as needed (constipation).    Yes Historical Provider, MD  lamoTRIgine (LAMICTAL) 100 MG tablet Take 0.5 tablets (50 mg total) by mouth 2 (two) times daily. 02/01/15  Yes Audery AmelJohn T Clapacs, MD  levothyroxine (SYNTHROID, LEVOTHROID) 50 MCG tablet TAKE 1 TABLET EVERY DAY 10/01/15  Yes Eustaquio BoydenJavier Gutierrez, MD  PRESCRIPTION MEDICATION Place 2 drops into both eyes daily as needed (dryness).   Yes Historical Provider, MD  warfarin (COUMADIN) 5 MG tablet TAKE DAILY AS DIRECTED BY COUMADIN CLINIC. Patient taking differently: Take 7.5mg  on Monday and Friday, all other days take 5mg . 10/03/15  Yes Eustaquio BoydenJavier Gutierrez, MD   BP 130/56 mmHg  Pulse 74  Temp(Src) 98.7 F (37.1 C) (Oral)  Resp 17  SpO2 100% Physical Exam  Constitutional: She is oriented to person, place, and time. She appears well-developed and well-nourished. No distress.  HENT:  Head: Normocephalic and atraumatic.  Nose: Nose normal.  Mouth/Throat: Oropharynx is clear and moist. No oropharyngeal exudate.  Eyes: Conjunctivae and EOM are normal. Pupils are equal, round, and reactive to light. No scleral icterus.  Neck: Normal range of motion. Neck supple. No JVD  present. No tracheal deviation present. No thyromegaly present.  Cardiovascular: Normal rate, regular rhythm and normal heart sounds.  Exam reveals no gallop and no friction rub.   No murmur heard. Pulmonary/Chest: Effort normal and breath sounds normal. No respiratory distress. She has no wheezes. She exhibits no tenderness.  Abdominal: Soft. Bowel sounds are normal. She exhibits no distension and no mass. There is no tenderness. There is no rebound and no guarding.  Musculoskeletal: Normal range of motion. She exhibits edema. She exhibits no tenderness.  Left lower extremity edema  Lymphadenopathy:  She has no cervical adenopathy.  Neurological: She is alert and oriented to person, place, and time. A cranial nerve deficit is present. She exhibits normal muscle tone.  Decreased sensation in left upper and lower extremity. Normal strength in all 4 extremities. Normal cerebellar testing.  Skin: Skin is warm and dry. No rash noted. No erythema. No pallor.  Nursing note and vitals reviewed.   ED Course  Procedures (including critical care time) Labs Review Labs Reviewed  PROTIME-INR - Abnormal; Notable for the following:    Prothrombin Time 32.8 (*)    INR 3.28 (*)    All other components within normal limits  APTT - Abnormal; Notable for the following:    aPTT 55 (*)    All other components within normal limits  COMPREHENSIVE METABOLIC PANEL - Abnormal; Notable for the following:    Glucose, Bld 103 (*)    All other components within normal limits  I-STAT CHEM 8, ED - Abnormal; Notable for the following:    Glucose, Bld 101 (*)    Hemoglobin 15.6 (*)    All other components within normal limits  ETHANOL  CBC  DIFFERENTIAL  URINE RAPID DRUG SCREEN, HOSP PERFORMED  URINALYSIS, ROUTINE W REFLEX MICROSCOPIC (NOT AT [facility])  I-STAT TROPOININ, ED  CBG MONITORING, ED    Imaging Review Ct Head Wo Contrast  10/27/2015  CLINICAL DATA:  Left arm and leg weakness and paresthesias. EXAM:  CT HEAD WITHOUT CONTRAST TECHNIQUE: Contiguous axial images were obtained from the base of the skull through the vertex without intravenous contrast. COMPARISON:  None. FINDINGS: There is no intracranial hemorrhage, mass or evidence of acute infarction. There is no extra-axial fluid collection. There is moderate generalized atrophy. Gray matter and white matter are unremarkable, with normal differentiation. No bone abnormality is evident. The visible paranasal sinuses are clear. Visible orbits are intact. IMPRESSION: No acute findings. Moderate generalized atrophy. Critical Value/emergent results were called by telephone at the time of interpretation on 10/27/2015 at 6:25 am to Dr. Tomasita Crumble , who verbally acknowledged these results. Electronically Signed   By: Ellery Plunk M.D.   On: 10/27/2015 06:26   I have personally reviewed and evaluated these images and lab results as part of my medical decision-making.   EKG Interpretation   Date/Time:  Saturday October 27 2015 06:24:50 EDT Ventricular Rate:  76 PR Interval:  175 QRS Duration: 98 QT Interval:  420 QTC Calculation: 472 R Axis:   -14 Text Interpretation:  Sinus rhythm No old tracing to compare Confirmed by  Erroll Luna [phone]) on 10/27/2015 7:02:40 AM      MDM   Final diagnoses:  None  Patient presents to emergency department for possible code stroke. This activated, radiologist informed me that the CT scan is negative. Dr. Roseanne Reno has evaluated the patient and his concern for stroke versus TIA. He recommends for MRI and inpatient evaluation. Patient continues to have numbness on my evaluation. I spoke with Dr. Maryfrances Bunnell who accepted the patient to telemetry for further care. Patient currently denies any pain. She appears well and is in no acute distress laying in the bed.  Tomasita Crumble, MD 10/27/15 [phone]

## 2015-10-27 NOTE — Progress Notes (Signed)
OT Cancellation Note  Patient Details Name: Kathryn Mathews MRN: [id number] DOB: 1946/04/14   Cancelled Treatment:    Reason Eval/Treat Not Completed: OT screened, no needs identified, will sign off. Per PT, pt with no acute OT needs; will sign off at this time. Please re-consult if needs change. Thank you for this referral.  Gaye AlkenBailey A Shigeo Baugh M.S., OTR/L Pager: [phone]  10/27/2015, 4:31 PM

## 2015-10-27 NOTE — Progress Notes (Signed)
VASCULAR LAB PRELIMINARY  PRELIMINARY  PRELIMINARY  PRELIMINARY  Carotid duplex completed.    Preliminary report:  1-39% ICA plaquing.  Vertebral artery flow is antegrade.   Thatcher Doberstein, RVT 10/27/2015, 11:41 AM

## 2015-10-27 NOTE — Discharge Summary (Signed)
Physician Discharge Summary  Patient ID: Kathryn Mathews MRN: [id number] DOB/AGE: [birth year] 70 y.o.  Admit date: 10/27/2015 Discharge date: 10/27/2015  Admission Diagnoses:Transient ischemic attack  Discharge Diagnoses:  Principal Problem:   TIA (transient ischemic attack) Active Problems:   Bipolar disorder (HCC)   GERD (gastroesophageal reflux disease)   HLD (hyperlipidemia)   Hypothyroid   History of DVT (deep vein thrombosis)   Essential hypertension   Stroke-like symptoms   Extremity numbness   Discharged Condition: Good  Hospital Course:   Case discussed with neurology PA on call for neurology. He had discussed the case with his attending Dr. Earl Lites. Patient was screened for physical therapy needs and had none. Physical therapy felt patient had no occupational therapy needs. Patient passed bedside swallow had no speech-language pathology needs. All were in agreement that patient could be cleared to go home after negative echo. Echo negative. Patient asking to go home for Easter holiday. Patient discharged. Outstanding lab includes 1 lipid panel. This can be done at PCP follow-up for neurology follow-up.  Consults: neurology  Significant Diagnostic Studies:   Dg Chest 2 View  10/27/2015  CLINICAL DATA:  70 year old female with tingling sensation in the left foot since 4 a.m. this morning, now with tingling sensation in the left arm. EXAM: CHEST  2 VIEW COMPARISON:  No priors. FINDINGS: Lung volumes are normal. No consolidative airspace disease. No pleural effusions. No pneumothorax. No pulmonary nodule or mass noted. Pulmonary vasculature and the cardiomediastinal silhouette are within normal limits. Atherosclerosis in the thoracic aorta. IMPRESSION: 1.  No radiographic evidence of acute cardiopulmonary disease. 2. Atherosclerosis. Electronically Signed   By: Trudie Reed M.D.   On: 10/27/2015 13:01   Ct Head Wo Contrast  10/27/2015  CLINICAL DATA:  Left arm and leg  weakness and paresthesias. EXAM: CT HEAD WITHOUT CONTRAST TECHNIQUE: Contiguous axial images were obtained from the base of the skull through the vertex without intravenous contrast. COMPARISON:  None. FINDINGS: There is no intracranial hemorrhage, mass or evidence of acute infarction. There is no extra-axial fluid collection. There is moderate generalized atrophy. Gray matter and white matter are unremarkable, with normal differentiation. No bone abnormality is evident. The visible paranasal sinuses are clear. Visible orbits are intact. IMPRESSION: No acute findings. Moderate generalized atrophy. Critical Value/emergent results were called by telephone at the time of interpretation on 10/27/2015 at 6:25 am to Dr. Tomasita Crumble , who verbally acknowledged these results. Electronically Signed   By: Ellery Plunk M.D.   On: 10/27/2015 06:26   Mr Maxine Glenn Head Wo Contrast  10/27/2015  CLINICAL DATA:  New onset of LEFT sided sensory deficit beginning earlier today. Patient is chronically anticoagulated. EXAM: MRI HEAD WITHOUT CONTRAST MRA HEAD WITHOUT CONTRAST TECHNIQUE: Multiplanar, multiecho pulse sequences of the brain and surrounding structures were obtained without intravenous contrast. Angiographic images of the head were obtained using MRA technique without contrast. COMPARISON:  CT head performed a few hours earlier. FINDINGS: MRI HEAD FINDINGS No evidence for acute infarction, hemorrhage, mass lesion, or hydrocephalus. Global atrophy. Minor white matter disease, nonspecific. Prominence of the frontal interhemispheric and convexity CSF spaces, likely incidental hygromas. Partial empty sella. No tonsillar herniation. Cervical spondylosis. Extracranial soft tissues unremarkable. MRA HEAD FINDINGS Internal carotid arteries show moderate dolichoectasia but are widely patent. No anterior circulation stenosis or occlusion. Basilar artery widely patent with vertebrals both contributing, LEFT dominant. No posterior  circulation disease. No intracranial aneurysm. IMPRESSION: No acute stroke is documented. Mild atrophy with incidental BILATERAL subdural hygromas  and minor nonspecific white matter disease. No significant finding on MRA intracranial. Electronically Signed   By: Elsie StainJohn T Curnes M.D.   On: 10/27/2015 13:02   Mr Brain Wo Contrast  10/27/2015  CLINICAL DATA:  New onset of LEFT sided sensory deficit beginning earlier today. Patient is chronically anticoagulated. EXAM: MRI HEAD WITHOUT CONTRAST MRA HEAD WITHOUT CONTRAST TECHNIQUE: Multiplanar, multiecho pulse sequences of the brain and surrounding structures were obtained without intravenous contrast. Angiographic images of the head were obtained using MRA technique without contrast. COMPARISON:  CT head performed a few hours earlier. FINDINGS: MRI HEAD FINDINGS No evidence for acute infarction, hemorrhage, mass lesion, or hydrocephalus. Global atrophy. Minor white matter disease, nonspecific. Prominence of the frontal interhemispheric and convexity CSF spaces, likely incidental hygromas. Partial empty sella. No tonsillar herniation. Cervical spondylosis. Extracranial soft tissues unremarkable. MRA HEAD FINDINGS Internal carotid arteries show moderate dolichoectasia but are widely patent. No anterior circulation stenosis or occlusion. Basilar artery widely patent with vertebrals both contributing, LEFT dominant. No posterior circulation disease. No intracranial aneurysm. IMPRESSION: No acute stroke is documented. Mild atrophy with incidental BILATERAL subdural hygromas and minor nonspecific white matter disease. No significant finding on MRA intracranial. Electronically Signed   By: Elsie StainJohn T Curnes M.D.   On: 10/27/2015 13:02   Dg Hip Unilat W Or W/o Pelvis 2-3 Views Left  10/11/2015  CLINICAL DATA:  Left hip pain. EXAM: DG HIP (WITH OR WITHOUT PELVIS) 2-3V LEFT COMPARISON:  None. FINDINGS: No fracture.  No bone lesion. Hip joint is normally spaced and aligned.  No  arthropathic changes. Right hip joint, SI joints and symphysis pubis are normally aligned. Soft tissues are unremarkable. IMPRESSION: 1. No fracture or bone lesion. 2. No hip joint abnormality. Electronically Signed   By: Amie Portlandavid  Ormond M.D.   On: 10/11/2015 16:00   ECHO Study Conclusions  - Left ventricle: The cavity size was normal. Systolic function was  normal. The estimated ejection fraction was in the range of 55%  to 60%. Wall motion was normal; there were no regional wall  motion abnormalities. There was an increased relative  contribution of atrial contraction to ventricular filling, which  may be seen with aging. Left ventricular diastolic function  parameters were normal. - Aortic valve: Valve area (VTI): 1.62 cm^2. Valve area (Vmax):  1.69 cm^2. Valve area (Vmean): 1.75 cm^2. - Mitral valve: Mildly calcified annulus.  Vascular Carotid Artery Duplex Bilateral Study information:  Study status: Routine. Procedure: The right CCA, right ECA, right ICA, right vertebral, left CCA, left ECA, left ICA, and left vertebral arteries were examined. A vascular evaluation was performed with the patient in the supine position. The study was technically limited due to movement. Carotid duplex study. Carotid Duplex exam including 2D imaging, color and spectral Doppler were performed on the extracranial carotid and vertebral arteries using standard established protocols. Birthdate: Patient birthdate: Dec 03, 1945. Age: Patient is 70 yr old. Sex: Gender: female. Height: Height: 144.8 cm. Height: 57 in. Weight: Weight: 65.5 kg. Weight: 144.1 lb. Body mass index: BMI: 31.2 kg/m^2. Body surface area: BSA: 1.65 m^2. Study date: Study date: 10/27/2015. Study time: 11:13 AM. Location: Vascular laboratory. Patient status: Inpatient.  Vascular U/S Venous Doppler Duplex Bil Study information:  Study status: Routine. Procedure: A vascular evaluation was performed  with the patient in the supine position. The right common femoral, right femoral, right greater saphenous, right lesser saphenous, right profunda femoral, right popliteal, right peroneal, right posterior tibial, left common femoral, left femoral, left greater saphenous, left  lesser saphenous, left profunda femoral, left popliteal, left peroneal, and left posterior tibial veins were studied. Image quality was adequate. Bilateral lower extremity venous duplex evaluation. Doppler flow study including B-mode compression maneuvers of all visualized segments, color flow Doppler and selected views of pulsed wave Doppler. Birthdate: Patient birthdate: 1946/03/13. Age: Patient is 70 yr old. Sex: Gender: female. Study date: Study date: 10/27/2015. Study time: 10:56 AM. Location: Vascular laboratory. Patient status: Inpatient.   Discharge Exam: Blood pressure 97/47, pulse 81, temperature 98.5 F (36.9 C), temperature source Oral, resp. rate 18, height  (1.448 m), weight 65.499 kg (144 lb 6.4 oz), SpO2 100 %.   General: Appears calm and comfortable. Flat affect  Eyes: PERRL, EOMI, normal lids, iris.   ENT: grossly normal hearing, lips & tongue  Neck: no lymphadenopathy, masses or thyromegaly  Cardiovascular: regular rate and rythm, 2/6 systolic murmur, rubs or gallops. No lower extremity edema   Respiratory: clear to auscultation bilaterally, no wheezing, rhonchi or rales. Normal respiratory effort.  Abdomen: soft,non-tender, normal bowel sounds  Skin: no rash or induration seen on limited exam. No open lesions.  Musculoskeletal: grossly normal tone in both upper and lower extremities  Psychiatric: grossly depressed mood and affect, speech fluent and appropriate  Neurologic: CN 2-12 grossly nl sensation throughout, moves all extremities in coordinated fashion.  Disposition: 01-Home or Self Care  Discharge Instructions    Ambulatory referral to Neurology     Complete by:  As directed   Dr. Lucia Gaskins requests follow up for this patient in 1 month.            Medication List    ASK your doctor about these medications        acetaminophen 500 MG tablet  Commonly known as:  TYLENOL  Take 500 mg by mouth every 8 (eight) hours as needed for moderate pain.     atorvastatin 10 MG tablet  Commonly known as:  LIPITOR  TAKE 1 TABLET AT BEDTIME     Biotin 1000 MCG tablet  Take 1,000 mcg by mouth 2 (two) times daily.     buPROPion 200 MG 12 hr tablet  Commonly known as:  WELLBUTRIN SR  TAKE 1 TABLET TWICE DAILY     Calcium-Vitamin D 600-400 MG-UNIT Tabs  Take 1 tablet by mouth daily.     clonazePAM 1 MG tablet  Commonly known as:  KLONOPIN  Take 1 tablet (1 mg total) by mouth 2 (two) times daily.     lamoTRIgine 100 MG tablet  Commonly known as:  LAMICTAL  Take 0.5 tablets (50 mg total) by mouth 2 (two) times daily.     levothyroxine 50 MCG tablet  Commonly known as:  SYNTHROID, LEVOTHROID  TAKE 1 TABLET EVERY DAY     PRESCRIPTION MEDICATION  Place 2 drops into both eyes daily as needed (dryness).     STOOL SOFTENER PO  Take 1 tablet by mouth daily as needed (constipation).     Vitamin D 2000 units Caps  Take 1 capsule (2,000 Units total) by mouth daily.     warfarin 5 MG tablet  Commonly known as:  COUMADIN  TAKE DAILY AS DIRECTED BY COUMADIN CLINIC.           Follow-up Information    Follow up with Anson Fret, MD. Schedule an appointment as soon as possible for a visit in 1 month.   Specialty:  Neurology   Why:  TIA follow-up   Contact information:   [address] [place] [address] [phone]       Follow up with Eustaquio Boyden, MD In 2 weeks.   Specialty:  Family Medicine   Why:  TIA, admitted to hospital   Contact information:   [address] [place] [address] [phone]       Signed: Haydee Salter 10/27/2015, 5:51 PM

## 2015-10-27 NOTE — ED Notes (Signed)
Attempted to call report to 5M.  

## 2015-10-27 NOTE — Progress Notes (Signed)
VASCULAR LAB PRELIMINARY  PRELIMINARY  PRELIMINARY  PRELIMINARY  Bilateral lower extremity venous duplex completed.    Preliminary report:  There is no DVT or SVT noted in the bilateral lower extremities.   Sargun Rummell, RVT 10/27/2015, 11:12 AM

## 2015-10-27 NOTE — ED Notes (Signed)
The pt woke up 2 hours ago with loss of feeling in her lt leg  umbness in the top of her lt arm and numbness in her tiongue and her lips  Code stroke was called.  Pt alert steady gait skin warm and dry.  To c-t

## 2015-10-27 NOTE — Evaluation (Signed)
Physical Therapy Evaluation Patient Details Name: Kathryn Mathews MRN: [id number] DOB: [birth year] Today's Date: 10/27/2015   History of Present Illness  Pt adm with lt arm and foot numbness. MRI and CT negative. PMH - HTN, DVT, PE  Clinical Impression  Pt doing well with mobility and no further PT needed.  Ready for dc from PT standpoint. No OT needed.      Follow Up Recommendations No PT follow up    Equipment Recommendations  None recommended by PT    Recommendations for Other Services       Precautions / Restrictions Precautions Precautions: None      Mobility  Bed Mobility Overal bed mobility: Modified Independent                Transfers Overall transfer level: Modified independent Equipment used: None                Ambulation/Gait Ambulation/Gait assistance: Modified independent (Device/Increase time) Ambulation Distance (Feet): 250 Feet Assistive device: None Gait Pattern/deviations: WFL(Within Functional Limits)   Gait velocity interpretation: at or above normal speed for age/gender    Stairs            Wheelchair Mobility    Modified Rankin (Stroke Patients Only) Modified Rankin (Stroke Patients Only) Pre-Morbid Rankin Score: No symptoms Modified Rankin: No significant disability     Balance Overall balance assessment: Modified Independent                                           Pertinent Vitals/Pain Pain Assessment: No/denies pain    Home Living Family/patient expects to be discharged to:: Private residence Living Arrangements: Spouse/significant other Available Help at Discharge: Family Type of Home: Apartment Home Access: Level entry     Home Layout: One level Home Equipment: None      Prior Function Level of Independence: Independent               Hand Dominance        Extremity/Trunk Assessment   Upper Extremity Assessment: Overall WFL for tasks assessed            Lower Extremity Assessment: Overall WFL for tasks assessed (numbness in dosum of lt foot)         Communication   Communication: No difficulties  Cognition Arousal/Alertness: Awake/alert Behavior During Therapy: WFL for tasks assessed/performed Overall Cognitive Status: Within Functional Limits for tasks assessed                      General Comments      Exercises        Assessment/Plan    PT Assessment Patent does not need any further PT services  PT Diagnosis Other (comment) (numbness in lt foot)   PT Problem List    PT Treatment Interventions     PT Goals (Current goals can be found in the Care Plan section) Acute Rehab PT Goals PT Goal Formulation: All assessment and education complete, DC therapy    Frequency     Barriers to discharge        Co-evaluation               End of Session   Activity Tolerance: Patient tolerated treatment well Patient left: in bed;with call bell/phone within reach;with family/visitor present Nurse Communication: Mobility status    Functional Assessment Tool Used: clinical  judgement Functional Limitation: Mobility: Walking and moving around Mobility: Walking and Moving Around Current Status [phone]): 0 percent impaired, limited or restricted Mobility: Walking and Moving Around Goal Status [phone]): 0 percent impaired, limited or restricted Mobility: Walking and Moving Around Discharge Status [phone]): 0 percent impaired, limited or restricted    Time: 3244-0102 PT Time Calculation (min) (ACUTE ONLY): 10 min   Charges:   PT Evaluation $PT Eval Low Complexity: 1 Procedure     PT G Codes:   PT G-Codes **NOT FOR INPATIENT CLASS** Functional Assessment Tool Used: clinical judgement Functional Limitation: Mobility: Walking and moving around Mobility: Walking and Moving Around Current Status (V2536): 0 percent impaired, limited or restricted Mobility: Walking and Moving Around Goal Status (U4403): 0 percent  impaired, limited or restricted Mobility: Walking and Moving Around Discharge Status [phone]): 0 percent impaired, limited or restricted    [facility] 10/27/2015, 4:59 PM [facility] PT [phone]

## 2015-10-27 NOTE — Consult Note (Signed)
Admission H&P    Chief Complaint: New onset numbness involving left side.  HPI: Kathryn Mathews is an 70 y.o. female with a history of hypertension, hyperlipidemia and DVT with pulmonary embolism, presenting with new onset numbness involving her left lip, arm and leg. She was last known well at 4 AM today. He had no changes in speech and no facial droop. She also had no weakness of left extremities. She has no previous history of stroke nor TIA. She's on chronic anticoagulation with Coumadin. INR was 3.28. CT scan of her head showed no acute intracranial abnormality. NIH stroke score was 1.  LSN: 4 AM on 10/27/2015 tPA Given: No: Minimal deficits mRankin:  Past Medical History  Diagnosis Date  . High cholesterol   . Anxiety   . Depression   . Humerus fracture   . Hypothyroidism   . Pulmonary embolism (HCC)   . History of chicken pox   . Bipolar disorder (HCC)     no psych  . GERD (gastroesophageal reflux disease)   . HLD (hyperlipidemia)   . Hypothyroid     previously hyperthyroid  . History of DVT (deep vein thrombosis)     on chronic coumadin since 1973, h/o blood clots on coumadin 2011, added ASA QD, but due to fall risk stopped and changed goal INR to 2.5-3.0  . Phlebitis     left leg stays swollen  . PAD (peripheral artery disease) (HCC)   . Alopecia     well controlled per previous PCP - improved with biotin  . Chronic sinusitis   . Glucose intolerance (impaired glucose tolerance)   . Closed right humeral fracture 2011    coracoid nondisplaced, conservative therapy (Dr. Cleon Gustin)  . Closed left humeral fracture 2010    closed left humeral neck fracture, also h/o L wrist fx  . Hyperactivity of bladder   . Osteopenia 2010, 2016    DEXA 2010: T -1.3 L spine, -2.1 femoral neck. DEXA 2016: T -2.4 hip, T -0.5 spine; rpt 2 yrs  . Falls     completed HHPT 10/2011, declined outpt PT  . Anxiety     when around ppl  . Diverticulosis     by colonoscopy    Past Surgical  History  Procedure Laterality Date  . Cesarean section    . Left heart catheterization with coronary angiogram N/A 09/05/2014    Procedure: LEFT HEART CATHETERIZATION WITH CORONARY ANGIOGRAM;  Surgeon: Marykay Lex, MD;  Location: [facility] CATH LAB;  Service: Cardiovascular;  Laterality: N/A;  . Cholecystectomy    . Cesarean section  1973  . Arterial thrombectomy  07/2009    with R mid popliteal artery angioplasty [balloon] (2011)[[[[  . Hospitalization  01/2007    depression, ECT  . Inguinal hernia repair  1980s    x2 on right  . Colonoscopy  08/2008    int/ext hem, severe diverticulosis with evidence of itis, rec rpt 10 yrs  . Cardiac catheterization  08/2014    nl EF, small branch OM not amenable to PCI [name])    Family History  Problem Relation Age of Onset  . Heart attack Mother 58  . Cancer Father   . Coronary artery disease Mother 24  . Hyperlipidemia Mother   . Heart disease Mother   . Hypertension Mother   . Breast cancer Maternal Aunt 54  . Coronary artery disease Maternal Uncle   . Coronary artery disease Maternal Grandmother 70  . Stroke Neg Hx   .  Diabetes Neg Hx   . Cancer Neg Hx    Social History:  reports that she has never smoked. She has never used smokeless tobacco. She reports that she does not drink alcohol or use illicit drugs.  Allergies:  Allergies  Allergen Reactions  . Codeine Nausea And Vomiting  . Acyclovir And Related Nausea Only  . Hydrocodone Other (See Comments)    Imbalance/falls    Medications: Patient's per admission medications were reviewed by me.  ROS: History obtained from the patient  General ROS: negative for - chills, fatigue, fever, night sweats, weight gain or weight loss Psychological ROS: negative for - behavioral disorder, hallucinations, memory difficulties, mood swings or suicidal ideation Ophthalmic ROS: negative for - blurry vision, double vision, eye pain or loss of vision ENT ROS: negative for - epistaxis, nasal  discharge, oral lesions, sore throat, tinnitus or vertigo Allergy and Immunology ROS: negative for - hives or itchy/watery eyes Hematological and Lymphatic ROS: negative for - bleeding problems, bruising or swollen lymph nodes Endocrine ROS: negative for - galactorrhea, hair pattern changes, polydipsia/polyuria or temperature intolerance Respiratory ROS: negative for - cough, hemoptysis, shortness of breath or wheezing Cardiovascular ROS: negative for - chest pain, dyspnea on exertion, edema or irregular heartbeat Gastrointestinal ROS: negative for - abdominal pain, diarrhea, hematemesis, nausea/vomiting or stool incontinence Genito-Urinary ROS: negative for - dysuria, hematuria, incontinence or urinary frequency/urgency Musculoskeletal ROS: Chronic low back pain Neurological ROS: as noted in HPI Dermatological ROS: negative for rash and skin lesion changes  Physical Examination: Blood pressure 130/56, pulse 74, temperature 98.7 F (37.1 C), temperature source Oral, resp. rate 17, SpO2 100 %.  HEENT-  Normocephalic, no lesions, without obvious abnormality.  Normal external eye and conjunctiva.  Normal TM's bilaterally.  Normal auditory canals and external ears. Normal external nose, mucus membranes and septum.  Normal pharynx. Neck supple with no masses, nodes, nodules or enlargement. Cardiovascular - regular rate and rhythm, S1, S2 normal, no murmur, click, rub or gallop Lungs - chest clear, no wheezing, rales, normal symmetric air entry Abdomen - soft, non-tender; bowel sounds normal; no masses,  no organomegaly Extremities - no joint deformities, effusion, or inflammation  Neurologic Examination: Mental Status: Alert, oriented, thought content appropriate.  Speech fluent without evidence of aphasia. Able to follow commands without difficulty. Cranial Nerves: II-Visual fields were normal. III/IV/VI-Pupils were equal and reacted normally to light. Extraocular movements were full and  conjugate.    V/VII-reduced perception of tactile sensation of the left side of the face compared to the right; no facial weakness. VIII-normal. X-normal speech and symmetrical palatal movement. XI: trapezius strength/neck flexion strength normal bilaterally XII-midline tongue extension with normal strength. Motor: 5/5 bilaterally with normal tone and bulk Sensory: Reduced perception of tactile sensation over left extremities compared to right extremities. Deep Tendon Reflexes: 2+ and symmetric. Plantars: Flexor bilaterally Cerebellar: Normal finger-to-nose testing. Carotid auscultation: Normal  Results for orders placed or performed during the hospital encounter of 10/27/15 (from the past 48 hour(s))  I-stat troponin, ED (not at [facility])     Status: None   Collection Time: 10/27/15  6:08 AM  Result Value Ref Range   Troponin i, poc 0.00 0.00 - 0.08 ng/mL   Comment 3            Comment: Due to the release kinetics of cTnI, a negative result within the first hours of the onset of symptoms does not rule out myocardial infarction with certainty. If myocardial infarction is still suspected, repeat  the test at appropriate intervals.   I-Stat Chem 8, ED  (not at [facility] [place], [facility])     Status: Abnormal   Collection Time: 10/27/15  6:11 AM  Result Value Ref Range   Sodium 143 135 - 145 mmol/L   Potassium 4.5 3.5 - 5.1 mmol/L   Chloride 106 101 - 111 mmol/L   BUN 20 6 - 20 mg/dL   Creatinine, Ser 1.61 0.44 - 1.00 mg/dL   Glucose, Bld 096 (H) 65 - 99 mg/dL   Calcium, Ion 0.45 4.09 - 1.30 mmol/L   TCO2 23 0 - 100 mmol/L   Hemoglobin 15.6 (H) 12.0 - 15.0 g/dL   HCT 81.1 91.4 - 78.2 %  Protime-INR     Status: Abnormal   Collection Time: 10/27/15  6:15 AM  Result Value Ref Range   Prothrombin Time 32.8 (H) 11.6 - 15.2 seconds   INR 3.28 (H) 0.00 - 1.49  APTT     Status: Abnormal   Collection Time: 10/27/15  6:15 AM  Result Value Ref Range   aPTT 55 (H) 24 - 37 seconds    Comment:         IF BASELINE aPTT IS ELEVATED, SUGGEST PATIENT RISK ASSESSMENT BE USED TO DETERMINE APPROPRIATE ANTICOAGULANT THERAPY.   CBC     Status: None   Collection Time: 10/27/15  6:15 AM  Result Value Ref Range   WBC 6.5 4.0 - 10.5 K/uL   RBC 4.34 3.87 - 5.11 MIL/uL   Hemoglobin 13.4 12.0 - 15.0 g/dL   HCT 95.6 21.3 - 08.6 %   MCV 96.3 78.0 - 100.0 fL   MCH 30.9 26.0 - 34.0 pg   MCHC 32.1 30.0 - 36.0 g/dL   RDW 57.8 46.9 - 62.9 %   Platelets 152 150 - 400 K/uL  Differential     Status: None   Collection Time: 10/27/15  6:15 AM  Result Value Ref Range   Neutrophils Relative % 60 %   Neutro Abs 3.9 1.7 - 7.7 K/uL   Lymphocytes Relative 32 %   Lymphs Abs 2.1 0.7 - 4.0 K/uL   Monocytes Relative 7 %   Monocytes Absolute 0.4 0.1 - 1.0 K/uL   Eosinophils Relative 1 %   Eosinophils Absolute 0.1 0.0 - 0.7 K/uL   Basophils Relative 0 %   Basophils Absolute 0.0 0.0 - 0.1 K/uL  CBG monitoring, ED     Status: None   Collection Time: 10/27/15  6:22 AM  Result Value Ref Range   Glucose-Capillary 88 65 - 99 mg/dL   Ct Head Wo Contrast  10/27/2015  CLINICAL DATA:  Left arm and leg weakness and paresthesias. EXAM: CT HEAD WITHOUT CONTRAST TECHNIQUE: Contiguous axial images were obtained from the base of the skull through the vertex without intravenous contrast. COMPARISON:  None. FINDINGS: There is no intracranial hemorrhage, mass or evidence of acute infarction. There is no extra-axial fluid collection. There is moderate generalized atrophy. Gray matter and white matter are unremarkable, with normal differentiation. No bone abnormality is evident. The visible paranasal sinuses are clear. Visible orbits are intact. IMPRESSION: No acute findings. Moderate generalized atrophy. Critical Value/emergent results were called by telephone at the time of interpretation on 10/27/2015 at 6:25 am to Dr. Tomasita Crumble , who verbally acknowledged these results. Electronically Signed   By: Ellery Plunk M.D.   On:  10/27/2015 06:26    Assessment: 70 y.o. female with multiple risk factors for stroke is entering with probable right subcortical  TIA. However, an acute small vessel cerebral infarction, particularly involving thalamus, cannot be ruled out.  Stroke Risk Factors - hyperlipidemia and hypertension  Plan: 1. HgbA1c, fasting lipid panel 2. MRI, MRA  of the brain without contrast 3. PT consult, OT consult, Speech consult 4. Echocardiogram 5. Carotid dopplers 6. Prophylactic therapy-Anticoagulation: Coumadin 7. Risk factor modification 8. Telemetry monitoring  C.R. Roseanne RenoStewart, MD [facility] [phone]  10/27/2015, 6:48 AM

## 2015-10-27 NOTE — H&P (Signed)
[facility] History and Physical  PHIL CORTI MVH:[id number] DOB: 1945/08/13 DOA: 10/27/2015  Referring physician:  PCP: Eustaquio Boyden, MD   Chief Complaint: New onset of Left sided numbness  HPI: Kathryn Mathews is a 70 y.o. female history of hypertension, hyperlipidemia, and history of DVT with PE chronic Coumadin since 1973, presenting today with new onset of numbness involving the left leg and left arm and left lip. She was last noted well at 4 AM. Denies any speech changes, or facial droop, or dysphagia. No motor deficiencies.No truouble finding words. No headaches, dizziness, vertigo, nausea or vomiting. Denies seizures, bowel or urine incontinence. She denies any prior strokes or TIAs. Denies fevers, chills, night sweats, vision changes, or mucositis. Denies any respiratory complaints. Denies any chest pain or palpitations.Reports recent Left lower extremity swelling. Denies abdominal pain. Appetite is normal.  Denies abnormal skin rashes, or neuropathy. Denies any bleeding issues such as epistaxis, hematemesis, hematuria or hematochezia. Ambulating without difficulty. No recent falls.   Does not smoke, drink or partake recreational drugs. No new meds or hormonal supplements. Denies any recent long distance trips. No recent infections.  At the ER, Neurology consulted the patient, recommending MRI MRA of the brain without contrast, 2-D echo, carotid Dopplers. She will be admitted for telemetry. INR 3.28. CT of the head showed no acute intracranial abnormality. NIH stroke score was 1. No TPA was given due to minimum deficits. CMET and CBC are unremarkable.  Review of Systems:   See HPI for significant positives. All other systems were reviewed and are negative.  Past Medical History  Diagnosis Date  . High cholesterol   . Anxiety   . Depression   . Humerus fracture   . Hypothyroidism   . Pulmonary embolism (HCC)   . History of chicken pox   . Bipolar disorder (HCC)    no psych  . GERD (gastroesophageal reflux disease)   . HLD (hyperlipidemia)   . Hypothyroid     previously hyperthyroid  . History of DVT (deep vein thrombosis)     on chronic coumadin since 1973, h/o blood clots on coumadin 2011, added ASA QD, but due to fall risk stopped and changed goal INR to 2.5-3.0  . Phlebitis     left leg stays swollen  . PAD (peripheral artery disease) (HCC)   . Alopecia     well controlled per previous PCP - improved with biotin  . Chronic sinusitis   . Glucose intolerance (impaired glucose tolerance)   . Closed right humeral fracture 2011    coracoid nondisplaced, conservative therapy (Dr. Cleon Gustin)  . Closed left humeral fracture 2010    closed left humeral neck fracture, also h/o L wrist fx  . Hyperactivity of bladder   . Osteopenia 2010, 2016    DEXA 2010: T -1.3 L spine, -2.1 femoral neck. DEXA 2016: T -2.4 hip, T -0.5 spine; rpt 2 yrs  . Falls     completed HHPT 10/2011, declined outpt PT  . Anxiety     when around ppl  . Diverticulosis     by colonoscopy   Past Surgical History  Procedure Laterality Date  . Cesarean section    . Left heart catheterization with coronary angiogram N/A 09/05/2014    Procedure: LEFT HEART CATHETERIZATION WITH CORONARY ANGIOGRAM;  Surgeon: Marykay Lex, MD;  Location: [facility] CATH LAB;  Service: Cardiovascular;  Laterality: N/A;  . Cholecystectomy    . Cesarean section  1973  . Arterial thrombectomy  07/2009  with R mid popliteal artery angioplasty [balloon] (2011)[[[[  . Hospitalization  01/2007    depression, ECT  . Inguinal hernia repair  1980s    x2 on right  . Colonoscopy  08/2008    int/ext hem, severe diverticulosis with evidence of itis, rec rpt 10 yrs  . Cardiac catheterization  08/2014    nl EF, small branch OM not amenable to PCI [name])   Social History:  reports that she has never smoked. She has never used smokeless tobacco. She reports that she does not drink alcohol or use illicit  drugs.  Allergies  Allergen Reactions  . Codeine Nausea And Vomiting  . Acyclovir And Related Nausea Only  . Hydrocodone Other (See Comments)    Imbalance/falls    Family History  Problem Relation Age of Onset  . Heart attack Mother 77  . Cancer Father   . Coronary artery disease Mother 24  . Hyperlipidemia Mother   . Heart disease Mother   . Hypertension Mother   . Breast cancer Maternal Aunt 54  . Coronary artery disease Maternal Uncle   . Coronary artery disease Maternal Grandmother 70  . Stroke Neg Hx   . Diabetes Neg Hx   . Cancer Neg Hx      Prior to Admission medications   Medication Sig Start Date End Date Taking? Authorizing Provider  acetaminophen (TYLENOL) 500 MG tablet Take 500 mg by mouth every 8 (eight) hours as needed for moderate pain.   Yes Historical Provider, MD  atorvastatin (LIPITOR) 10 MG tablet TAKE 1 TABLET AT BEDTIME 08/20/15  Yes Eustaquio Boyden, MD  Biotin 1000 MCG tablet Take 1,000 mcg by mouth 2 (two) times daily.     Yes Historical Provider, MD  buPROPion (WELLBUTRIN SR) 200 MG 12 hr tablet TAKE 1 TABLET TWICE DAILY 08/20/15  Yes Audery Amel, MD  Calcium Carb-Cholecalciferol (CALCIUM-VITAMIN D) 600-400 MG-UNIT TABS Take 1 tablet by mouth daily.   Yes Historical Provider, MD  Cholecalciferol (VITAMIN D) 2000 UNITS CAPS Take 1 capsule (2,000 Units total) by mouth daily. 08/05/11  Yes Eustaquio Boyden, MD  clonazePAM (KLONOPIN) 1 MG tablet Take 1 tablet (1 mg total) by mouth 2 (two) times daily. 02/01/15  Yes Audery Amel, MD  Docusate Calcium (STOOL SOFTENER PO) Take 1 tablet by mouth daily as needed (constipation).    Yes Historical Provider, MD  lamoTRIgine (LAMICTAL) 100 MG tablet Take 0.5 tablets (50 mg total) by mouth 2 (two) times daily. 02/01/15  Yes Audery Amel, MD  levothyroxine (SYNTHROID, LEVOTHROID) 50 MCG tablet TAKE 1 TABLET EVERY DAY 10/01/15  Yes Eustaquio Boyden, MD  PRESCRIPTION MEDICATION Place 2 drops into both eyes daily as  needed (dryness).   Yes Historical Provider, MD  warfarin (COUMADIN) 5 MG tablet TAKE DAILY AS DIRECTED BY COUMADIN CLINIC. Patient taking differently: Take 7.5mg  on Monday and Friday, all other days take 5mg . 10/03/15  Yes Eustaquio Boyden, MD   Physical Exam: Filed Vitals:   10/27/15 0645 10/27/15 0700 10/27/15 0715 10/27/15 0730  BP: 122/47 109/52 113/48 110/55  Pulse: 74 71 71 70  Temp:      TempSrc:      Resp: 25 17 23 22   SpO2: 99% 98% 98% 98%    Wt Readings from Last 3 Encounters:  10/11/15 65.205 kg (143 lb 12 oz)  05/24/15 66.679 kg (147 lb)  05/07/15 66.044 kg (145 lb 9.6 oz)    General: Appears calm and comfortable. Flat affect Eyes:  PERRL,  EOMI, normal lids, iris.  ENT: grossly normal hearing, lips & tongue Neck: no lymphadenopathy, masses or thyromegaly Cardiovascular: regular rate and rythm, 2/6 systolic murmur, rubs or gallops. No lower extremity edema   Respiratory: clear to auscultation bilaterally, no wheezing, rhonchi or rales. Normal respiratory effort. Abdomen: soft,non-tender, normal bowel sounds Skin: no rash or induration seen on limited exam. No open lesions. Musculoskeletal:  grossly normal tone in both upper and lower extremities Psychiatric: grossly depressed mood and affect, speech fluent and appropriate Neurologic: CN 2-12 grossly intact except for decreased sensation in her left lips and left tricept area, moves all extremities in coordinated fashion.          Labs on Admission:  Basic Metabolic Panel:  Recent Labs Lab 10/27/15 0611 10/27/15 0615  NA 143 142  K 4.5 4.4  CL 106 106  CO2  --  25  GLUCOSE 101* 103*  BUN 20 17  CREATININE 0.70 0.90  CALCIUM  --  9.5    Liver Function Tests:  Recent Labs Lab 10/27/15 0615  AST 24  ALT 14  ALKPHOS 54  BILITOT 0.4  PROT 6.7  ALBUMIN 3.7   No results for input(s): LIPASE, AMYLASE in the last 168 hours. No results for input(s): AMMONIA in the last 168 hours.  CBC:  Recent  Labs Lab 10/27/15 0611 10/27/15 0615  WBC  --  6.5  NEUTROABS  --  3.9  HGB 15.6* 13.4  HCT 46.0 41.8  MCV  --  96.3  PLT  --  152    CBG:  Recent Labs Lab 10/27/15 0622  GLUCAP 88    Radiological Exams on Admission: Ct Head Wo Contrast  10/27/2015  CLINICAL DATA:  Left arm and leg weakness and paresthesias. EXAM: CT HEAD WITHOUT CONTRAST TECHNIQUE: Contiguous axial images were obtained from the base of the skull through the vertex without intravenous contrast. COMPARISON:  None. FINDINGS: There is no intracranial hemorrhage, mass or evidence of acute infarction. There is no extra-axial fluid collection. There is moderate generalized atrophy. Gray matter and white matter are unremarkable, with normal differentiation. No bone abnormality is evident. The visible paranasal sinuses are clear. Visible orbits are intact. IMPRESSION: No acute findings. Moderate generalized atrophy. Critical Value/emergent results were called by telephone at the time of interpretation on 10/27/2015 at 6:25 am to Dr. Tomasita CrumbleADELEKE ONI , who verbally acknowledged these results. Electronically Signed   By: Ellery Plunkaniel R Mitchell M.D.   On: 10/27/2015 06:26    EKG: Independently reviewed.    Assessment/Plan Active Problems:   Bipolar disorder (HCC)   GERD (gastroesophageal reflux disease)   HLD (hyperlipidemia)   Hypothyroid   History of DVT (deep vein thrombosis)   Essential hypertension   Strokelike symptoms/ remote PE and DVT on chronic Coumadin.  Not TPA candidate as symptoms improved. CT without acute abnormality. EKG within limits of normal. Chest xray unremarkable.Her neuro deficits improved in the ED except for mils left arm numbness and left lip decreased sensation. NIH stroke score was 1 Admit to tele blood pressure control pending on the MRI results, for now allow permissive hypertension  2 D Echo  with carotid dopplers   MRI of the brain  with contrast MRA neck Bilateral LE US to rule out  DVT Aspirin and high-dose statin Coumadin on hold due to supratherapeutic INR. SCDs for now  lipid panel Hemoglobin A1c Check B12 PT/ OT/ speech consult Bedside swallow eval and if passes heart healthy diet Continue Aspirin and high-dose statin  Cycle troponin   Hypertension BP 110/55 mmHg  Pulse 70  Temp(Src) 98.7 F (37.1 C) (Oral)  Resp 22  SpO2 98%  Currently controlled Hold anti-hypertensive medications  as patient's symptoms have not completely resolved to create permissive hypertension to help prevent further TIA events  May use hydralazine for greater than 210 systolic or greater than 110 diastolic   Hyperlipidemia Continue home statins  Hypothyroidism: Chronic. Last TSH was 1.78 in 11/2014 Recheck TSH  -Continue home Synthroid  Bipolar disorder/Anxiety/Depression Continue home meds with Lamictal and Wellbutrin  Deconditioning  PT/OT   Code Status: Full Code  DVT Prophylaxis: Coumadin on hold as  Per Pharmacy recs, Goal 2-3. In the interim, SCDs Family Communication:  Family at bedside Disposition Plan: Pending Improvement. Admitted for observation in tele bed. Expected LOS 24-48 hrs    [facility] E,PA-C [facility] [email or web address] Password [email or web address]

## 2015-10-27 NOTE — Progress Notes (Signed)
ANTICOAGULATION CONSULT NOTE - Initial Consult  Pharmacy Consult for Coumadin Indication: Hx of DVT  Allergies  Allergen Reactions  . Codeine Nausea And Vomiting  . Acyclovir And Related Nausea Only  . Hydrocodone Other (See Comments)    Imbalance/falls    Patient Measurements: TBW 65 kg  Vital Signs: Temp: 98.7 F (37.1 C) (04/15 0557) Temp Source: Oral (04/15 0557) BP: 110/55 mmHg (04/15 0730) Pulse Rate: 70 (04/15 0730)  Labs:  Recent Labs  10/27/15 0611 10/27/15 0615  HGB 15.6* 13.4  HCT 46.0 41.8  PLT  --  152  APTT  --  55*  LABPROT  --  32.8*  INR  --  3.28*  CREATININE 0.70 0.90    CrCl cannot be calculated (Unknown ideal weight.).   Medical History: Past Medical History  Diagnosis Date  . High cholesterol   . Anxiety   . Depression   . Humerus fracture   . Hypothyroidism   . Pulmonary embolism (HCC)   . History of chicken pox   . Bipolar disorder (HCC)     no psych  . GERD (gastroesophageal reflux disease)   . HLD (hyperlipidemia)   . Hypothyroid     previously hyperthyroid  . History of DVT (deep vein thrombosis)     on chronic coumadin since 1973, h/o blood clots on coumadin 2011, added ASA QD, but due to fall risk stopped and changed goal INR to 2.5-3.0  . Phlebitis     left leg stays swollen  . PAD (peripheral artery disease) (HCC)   . Alopecia     well controlled per previous PCP - improved with biotin  . Chronic sinusitis   . Glucose intolerance (impaired glucose tolerance)   . Closed right humeral fracture 2011    coracoid nondisplaced, conservative therapy (Dr. Cleon Gustinorning)  . Closed left humeral fracture 2010    closed left humeral neck fracture, also h/o L wrist fx  . Hyperactivity of bladder   . Osteopenia 2010, 2016    DEXA 2010: T -1.3 L spine, -2.1 femoral neck. DEXA 2016: T -2.4 hip, T -0.5 spine; rpt 2 yrs  . Falls     completed HHPT 10/2011, declined outpt PT  . Anxiety     when around ppl  . Diverticulosis     by  colonoscopy    Assessment: 70 yo F presents on 4/15 with new onset of L sided numbness. PMH includes DVT with PE. Has been on coumadin for a while. Takes coumadin 5mg  daily exc 7.5mg  on Mon/Fri. Last dose was on 4/14. INR on admit was supratherapeutic at 3.28. CBC stable, no s/s of bleed.  Goal of Therapy:  INR 2-3 Monitor platelets by anticoagulation protocol: Yes   Plan:  Hold coumadin tonight Monitor daily INR, CBC, s/s of bleed  Enzo BiNathan Maurissa Ambrose, PharmD, [facility] LLCBCPS Clinical Pharmacist Pager [phone] 10/27/2015 8:34 AM

## 2015-10-27 NOTE — Progress Notes (Signed)
Received from ED via stretcher; patient is alert and oriented; no acute distress; husband at bedside; oriented to room and unit routine.

## 2015-10-27 NOTE — Progress Notes (Signed)
*  PRELIMINARY RESULTS* Echocardiogram 2D Echocardiogram has been performed.  Doristine SectionKristy H Victorious Kundinger 10/27/2015, 3:41 PM

## 2015-10-27 NOTE — Progress Notes (Signed)
STROKE TEAM PROGRESS NOTE   HISTORY OF PRESENT ILLNESS Kathryn Mathews is an [age] y.o. female with a history of hypertension, hyperlipidemia and DVT with pulmonary embolism, presenting with new onset numbness involving her left lip, arm and leg. She was last known well at 4 AM today. She had no changes in speech and no facial droop. She also had no weakness of left extremities. She has no previous history of stroke nor TIA. She's on chronic anticoagulation with Coumadin. INR was 3.28. CT scan of her head showed no acute intracranial abnormality. NIH stroke score was 1.  LSN: 4 AM on 10/27/2015 tPA Given: No: Minimal deficits mRankin:   SUBJECTIVE (INTERVAL HISTORY) The patient's husband is at the bedside. The patient is very anxious to go home. She feels she is back to baseline. I explained to the patient and her husband that I would need to discuss with Dr. Earl LitesGregory and the patient would need early follow-up with regards to her Coumadin dosing. She would also need follow-up with neurology. Her 2-D echo is currently pending.   OBJECTIVE Temp:  [98 F (36.7 C)-98.7 F (37.1 C)] 98 F (36.7 C) (04/15 0900) Pulse Rate:  [70-80] 70 (04/15 0844) Cardiac Rhythm:  [-]  Resp:  [17-25] 20 (04/15 0844) BP: (109-158)/(47-91) 131/50 mmHg (04/15 0844) SpO2:  [98 %-100 %] 98 % (04/15 0844) Weight:  [65.499 kg (144 lb 6.4 oz)] 65.499 kg (144 lb 6.4 oz) (04/15 0844)  CBC:  Recent Labs Lab 10/27/15 0611 10/27/15 0615  WBC  --  6.5  NEUTROABS  --  3.9  HGB 15.6* 13.4  HCT 46.0 41.8  MCV  --  96.3  PLT  --  152    Basic Metabolic Panel:  Recent Labs Lab 10/27/15 0611 10/27/15 0615  NA 143 142  K 4.5 4.4  CL 106 106  CO2  --  25  GLUCOSE 101* 103*  BUN 20 17  CREATININE 0.70 0.90  CALCIUM  --  9.5    Lipid Panel:    Component Value Date/Time   CHOL 153 09/02/2014 0751   CHOL 157 04/15/2010   TRIG 138 09/02/2014 0751   TRIG 114 04/15/2010   HDL 41 09/02/2014 0751   CHOLHDL 3.7  09/02/2014 0751   VLDL 28 09/02/2014 0751   LDLCALC 84 09/02/2014 0751   HgbA1c:  Lab Results  Component Value Date   HGBA1C 5.5 08/11/2011   Urine Drug Screen: No results found for: LABOPIA, COCAINSCRNUR, LABBENZ, AMPHETMU, THCU, LABBARB    IMAGING  Ct Head Wo Contrast 10/27/2015   No acute findings. Moderate generalized atrophy.    MRI / MRA Head 10/27/2015 No acute stroke is documented. Mild atrophy with incidental BILATERAL subdural hygromas and minor nonspecific white matter disease. No significant finding on MRA intracranial.    PHYSICAL EXAM Mental Status: Alert, oriented, thought content appropriate.  Speech fluent without evidence of aphasia.  Able to follow 3 step commands without difficulty. Cranial Nerves: II: Discs not visualized; Visual fields grossly normal, pupils equal, round, reactive to light and accommodation III,IV, VI: ptosis not present, extra-ocular motions intact bilaterally V,VII: smile symmetric, facial light touch sensation normal bilaterally VIII: hearing normal bilaterally IX,X: gag reflex present XI: bilateral shoulder shrug XII: midline tongue extension Motor: Right : Upper extremity   5/5    Left:     Upper extremity   5/5  Lower extremity   5/5     Lower extremity   5/5 Tone and bulk:normal tone  throughout; no atrophy noted Sensory: Light touch intact throughout, bilaterally Deep Tendon Reflexes: 2+ and symmetric throughout Plantars: Right: downgoing   Left: downgoing Cerebellar: normal finger-to-nose, normal rapid alternating movements and normal heel-to-shin test Gait: Deferred    ASSESSMENT/PLAN [name] is a 70 y.o. female with history of previous DVT with pulmonary embolus, chronic Coumadin therapy (INR 3.28 on admission), bipolar disorder, hyperlipidemia, and history of falls presenting with new onset numbness involving the left lip, arm and leg. She did not receive IV t-PA due to anticoagulation with  Coumadin.  Suspected stroke:  Non-dominant infarct felt to be embolic from an unknown source.  Resultant  the patient is back to baseline.  MRI - no acute findings  MRA - normal MRA  Carotid Doppler - 1-39% ICA plaquing. Vertebral artery flow is antegrade.   2D Echo - EF 55-60%. No cardiac source of emboli identified.  LDL not drawn this a.m. as ordered. Not really ordered as the patient had already eaten.  HgbA1c pending  VTE prophylaxis - warfarin  Diet Heart Room service appropriate?: Yes; Fluid consistency:: Thin  warfarin daily prior to admission, now on warfarin daily  Ongoing aggressive stroke risk factor management  Therapy recommendations: Deficits resolved  Disposition: Pending  Hypertension  Stable - mildly low at times  Permissive hypertension (OK if < 220/120) but gradually normalize in 5-7 days  Hyperlipidemia  Home meds:  Lipitor 10 mg daily prior to admission  LDL pending, goal < 70  Based on possible TIA will increase Lipitor to 40 mg daily per Dr. Earl Lites  Continue statin at discharge    Other Stroke Risk Factors  Advanced age  Obesity, Body mass index is 31.24 kg/(m^2).    Other Active Problems  History of DVT and pulmonary emboli.  Patient states she has been on warfarin since age 53  Elevated INR. Patient instructed to have a pro time blood test Monday for further adjustment of Coumadin as indicated. We will need early follow-up with neurology. Dr Lucia Gaskins 1 month.  PLAN  As discussed with Dr. Earl Lites and Dr. Melynda Ripple. Patient anxious for discharge since tomorrow's Easter. MRI and MRA of the head negative. Carotid Dopplers unremarkable. If 2-D echo is normal OK from stroke standpoint to proceed with discharge. Spoke with the lab. Hemoglobin A1c and lipid profile not drawn. Will order hemoglobin A1c stat now and repeat a pro time as well. I will not order a lipid profile as patient is already eaten.   Hospital day # 0  Delton See [facility] Pager [phone] 10/27/2015, 4:29 PM  ATTENDING NOTE: Patient was seen and examined by me personally. Documentation reflects findings. The laboratory and radiographic studies reviewed by me. ROS completed by me personally and pertinent positives fully documented  Condition:  Stable  Assessment and plan completed by me personally and fully documented above. Plans/Recommendations include:     Discussed case with APC.  I expressed concern regarding early discharge  Clear instructions were given regarding the need for very careful and early follow-up  Discharge decision deferred to medicine team.   SIGNED BY: Dr. Sula Soda     To contact Stroke Continuity provider, please refer to [email or web address]. After hours, contact General Neurology

## 2015-10-29 ENCOUNTER — Telehealth: Payer: Self-pay | Admitting: *Deleted

## 2015-10-29 ENCOUNTER — Ambulatory Visit (INDEPENDENT_AMBULATORY_CARE_PROVIDER_SITE_OTHER): Payer: Commercial Managed Care - HMO | Admitting: *Deleted

## 2015-10-29 DIAGNOSIS — I739 Peripheral vascular disease, unspecified: Secondary | ICD-10-CM | POA: Diagnosis not present

## 2015-10-29 DIAGNOSIS — Z86718 Personal history of other venous thrombosis and embolism: Secondary | ICD-10-CM | POA: Diagnosis not present

## 2015-10-29 DIAGNOSIS — Z5181 Encounter for therapeutic drug level monitoring: Secondary | ICD-10-CM

## 2015-10-29 LAB — HEMOGLOBIN A1C
Hgb A1c MFr Bld: 5.4 % (ref 4.8–5.6)
Mean Plasma Glucose: 108 mg/dL

## 2015-10-29 LAB — POCT INR: INR: 1.8

## 2015-10-29 NOTE — Telephone Encounter (Signed)
Transition Care Management Follow-up Telephone Call   Date discharged?   How have you been since you were released from the hospital? Doing well, much improved.   Do you understand why you were in the hospital? yes   Do you understand the discharge instructions? yes   Where were you discharged to? home   Items Reviewed:  Medications reviewed: yes  Allergies reviewed: yes  Dietary changes reviewed: yes, low vit K  Referrals reviewed: neurology   Functional Questionnaire:   Activities of Daily Living (ADLs):   She states they are independent in the following: ambulation, bathing and hygiene, feeding, continence, grooming, toileting and dressing States they require assistance with the following: none   Any transportation issues/concerns?: no   Any patient concerns? no   Confirmed importance and date/time of follow-up visits scheduled yes, 11/15/15 @ 1245  Provider Appointment booked with Eustaquio BoydenJavier Gutierrez, MD  Confirmed with patient if condition begins to worsen call PCP or go to the ER.  Patient was given the office number and encouraged to call back with question or concerns.  : yes

## 2015-10-29 NOTE — Progress Notes (Signed)
[facility] for [facility].  INR was 3.75 and she was instructed to hold all doses until she was rechecked in our clinic.  She has skipped the last two days.  INR is sub therapeutic today.  Patient instructed to restart Coumadin today and 5 mg daily.  Will recheck in 2-3 weeks and make additional adjustments at that time if needed.

## 2015-10-29 NOTE — Progress Notes (Signed)
Pre visit review using our clinic review tool, if applicable. No additional management support is needed unless otherwise documented below in the visit note. 

## 2015-10-30 NOTE — Telephone Encounter (Signed)
2nd request for medication for clonazepam 1mg  pt has appt on the 11-05-15

## 2015-11-01 ENCOUNTER — Encounter (HOSPITAL_COMMUNITY): Payer: Self-pay | Admitting: *Deleted

## 2015-11-01 ENCOUNTER — Emergency Department (HOSPITAL_COMMUNITY): Payer: Commercial Managed Care - HMO

## 2015-11-01 ENCOUNTER — Emergency Department (HOSPITAL_COMMUNITY)
Admission: EM | Admit: 2015-11-01 | Discharge: 2015-11-01 | Disposition: A | Discharge status: Home / Self Care | Payer: Commercial Managed Care - HMO | Attending: Emergency Medicine | Admitting: Emergency Medicine

## 2015-11-01 DIAGNOSIS — Z8719 Personal history of other diseases of the digestive system: Secondary | ICD-10-CM | POA: Diagnosis not present

## 2015-11-01 DIAGNOSIS — Z86718 Personal history of other venous thrombosis and embolism: Secondary | ICD-10-CM | POA: Insufficient documentation

## 2015-11-01 DIAGNOSIS — F13930 Sedative, hypnotic or anxiolytic use, unspecified with withdrawal, uncomplicated: Secondary | ICD-10-CM

## 2015-11-01 DIAGNOSIS — E785 Hyperlipidemia, unspecified: Secondary | ICD-10-CM | POA: Insufficient documentation

## 2015-11-01 DIAGNOSIS — Z79899 Other long term (current) drug therapy: Secondary | ICD-10-CM | POA: Insufficient documentation

## 2015-11-01 DIAGNOSIS — Z8781 Personal history of (healed) traumatic fracture: Secondary | ICD-10-CM | POA: Insufficient documentation

## 2015-11-01 DIAGNOSIS — R202 Paresthesia of skin: Secondary | ICD-10-CM | POA: Diagnosis not present

## 2015-11-01 DIAGNOSIS — Z8619 Personal history of other infectious and parasitic diseases: Secondary | ICD-10-CM | POA: Insufficient documentation

## 2015-11-01 DIAGNOSIS — Z9889 Other specified postprocedural states: Secondary | ICD-10-CM | POA: Diagnosis not present

## 2015-11-01 DIAGNOSIS — Z86711 Personal history of pulmonary embolism: Secondary | ICD-10-CM | POA: Insufficient documentation

## 2015-11-01 DIAGNOSIS — F13239 Sedative, hypnotic or anxiolytic dependence with withdrawal, unspecified: Secondary | ICD-10-CM | POA: Diagnosis not present

## 2015-11-01 DIAGNOSIS — Z7901 Long term (current) use of anticoagulants: Secondary | ICD-10-CM | POA: Insufficient documentation

## 2015-11-01 DIAGNOSIS — F319 Bipolar disorder, unspecified: Secondary | ICD-10-CM | POA: Insufficient documentation

## 2015-11-01 DIAGNOSIS — E7800 Pure hypercholesterolemia, unspecified: Secondary | ICD-10-CM | POA: Diagnosis not present

## 2015-11-01 DIAGNOSIS — R200 Anesthesia of skin: Secondary | ICD-10-CM | POA: Diagnosis present

## 2015-11-01 DIAGNOSIS — F13939 Sedative, hypnotic or anxiolytic use, unspecified with withdrawal, unspecified: Secondary | ICD-10-CM | POA: Diagnosis not present

## 2015-11-01 DIAGNOSIS — R531 Weakness: Secondary | ICD-10-CM | POA: Diagnosis not present

## 2015-11-01 DIAGNOSIS — E039 Hypothyroidism, unspecified: Secondary | ICD-10-CM | POA: Diagnosis not present

## 2015-11-01 DIAGNOSIS — F419 Anxiety disorder, unspecified: Secondary | ICD-10-CM | POA: Diagnosis not present

## 2015-11-01 DIAGNOSIS — F13230 Sedative, hypnotic or anxiolytic dependence with withdrawal, uncomplicated: Secondary | ICD-10-CM

## 2015-11-01 DIAGNOSIS — R51 Headache: Secondary | ICD-10-CM | POA: Diagnosis not present

## 2015-11-01 LAB — CBC WITH DIFFERENTIAL/PLATELET
BASOS ABS: 0 10*3/uL (ref 0.0–0.1)
BASOS PCT: 0 %
EOS PCT: 1 %
Eosinophils Absolute: 0.1 10*3/uL (ref 0.0–0.7)
HEMATOCRIT: 44.6 % (ref 36.0–46.0)
Hemoglobin: 14.3 g/dL (ref 12.0–15.0)
LYMPHS PCT: 26 %
Lymphs Abs: 1.9 10*3/uL (ref 0.7–4.0)
MCH: 30.8 pg (ref 26.0–34.0)
MCHC: 32.1 g/dL (ref 30.0–36.0)
MCV: 95.9 fL (ref 78.0–100.0)
MONO ABS: 0.4 10*3/uL (ref 0.1–1.0)
Monocytes Relative: 5 %
NEUTROS ABS: 4.8 10*3/uL (ref 1.7–7.7)
Neutrophils Relative %: 68 %
PLATELETS: 180 10*3/uL (ref 150–400)
RBC: 4.65 MIL/uL (ref 3.87–5.11)
RDW: 13.4 % (ref 11.5–15.5)
WBC: 7.1 10*3/uL (ref 4.0–10.5)

## 2015-11-01 LAB — BASIC METABOLIC PANEL
ANION GAP: 11 (ref 5–15)
BUN: 10 mg/dL (ref 6–20)
CALCIUM: 9.8 mg/dL (ref 8.9–10.3)
CO2: 23 mmol/L (ref 22–32)
Chloride: 106 mmol/L (ref 101–111)
Creatinine, Ser: 0.82 mg/dL (ref 0.44–1.00)
GLUCOSE: 98 mg/dL (ref 65–99)
POTASSIUM: 4.3 mmol/L (ref 3.5–5.1)
SODIUM: 140 mmol/L (ref 135–145)

## 2015-11-01 LAB — I-STAT TROPONIN, ED: Troponin i, poc: 0 ng/mL (ref 0.00–0.08)

## 2015-11-01 LAB — PROTIME-INR
INR: 1.8 — AB (ref 0.00–1.49)
PROTHROMBIN TIME: 20.8 s — AB (ref 11.6–15.2)

## 2015-11-01 MED ORDER — CLONAZEPAM 1 MG PO TABS: 1.0000 mg | ORAL_TABLET | Freq: Two times a day (BID) | ORAL | Status: DC

## 2015-11-01 MED ORDER — CLONAZEPAM 0.5 MG PO TABS
1.0000 mg | ORAL_TABLET | Freq: Once | ORAL | Status: AC
  Administered 2015-11-01: 1 mg via ORAL
  Filled 2015-11-01: qty 2

## 2015-11-01 NOTE — Telephone Encounter (Signed)
pt called states that she been off her klonopin for 5 days now and she is having withdrawals.

## 2015-11-01 NOTE — Discharge Instructions (Signed)
Benzodiazepine Withdrawal  °Benzodiazepines are a group of drugs that are prescribed for both short-term and long-term treatment of a variety of medical conditions. For some of these conditions, such as seizures and sudden and severe muscle spasms, they are used only for a few hours or a few days. For other conditions, such as anxiety, sleep problems, or frequent muscle spasms or to help prevent seizures, they are used for an extended period, usually weeks or months. °Benzodiazepines work by changing the way your brain functions. Normally, chemicals in your brain called neurotransmitters send messages between your brain cells. The neurotransmitter that benzodiazepines affect is called gamma-aminobutyric acid (GABA). GABA sends out messages that have a calming effect on many of the functions of your brain. Benzodiazepines make these messages stronger and increase this calming effect. °Short-term use of benzodiazepines usually does not cause problems when you stop taking the drugs. However, if you take benzodiazepines for a long time, your body can adjust to the drug and require more of it to produce the same effect (drug tolerance). Eventually, you can develop physical dependence on benzodiazepines, which is when you experience negative effects if your dosage of benzodiazepines is reduced or stopped too quickly. These negative effects are called symptoms of withdrawal. °SYMPTOMS °Symptoms of withdrawal may begin anytime within the first 10 days after you stop taking the benzodiazepine. They can last from several weeks up to a few months but usually are the worst between the first 10 to 14 days.  °The actual symptoms also vary, depending on the type of benzodiazepine you take. Possible symptoms include: °· Anxiety. °· Excitability. °· Irritability. °· Depression. °· Mood swings. °· Trouble sleeping. °· Confusion. °· Uncontrollable shaking (tremors). °· Muscle weakness. °· Seizures. °DIAGNOSIS °To diagnose  benzodiazepine withdrawal, your caregiver will examine you for certain signs, such as: °· Rapid heartbeat. °· Rapid breathing. °· Tremors. °· High blood pressure. °· Fever. °· Mood changes. °Your caregiver also may ask the following questions about your use of benzodiazepines: °· What type of benzodiazepine did you take? °· How much did you take each day? °· How long did you take the drug? °· When was the last time you took the drug? °· Do you take any other drugs? °· Have you had alcohol recently? °· Have you had a seizure recently? °· Have you lost consciousness recently? °· Have you had trouble remembering recent events? °· Have you had a recent increase in anxiety, irritability, or trouble sleeping? °A drug test also may be administered. °TREATMENT °The treatment for benzodiazepine withdrawal can vary, depending on the type and severity of your symptoms, what type of benzodiazepine you have been taking, and how long you have been taking the benzodiazepine. Sometimes it is necessary for you to be treated in a hospital, especially if you are at risk of seizures.  °Often, treatment includes a prescription for a long-acting benzodiazepine, the dosage of which is reduced slowly over a long period. This period could be several weeks or months. Eventually, your dosage will be reduced to a point that you can stop taking the drug, without experiencing withdrawal symptoms. This is called tapered withdrawal. Occasionally, minor symptoms of withdrawal continue for a few days or weeks after you have completed a tapered withdrawal. °SEEK IMMEDIATE MEDICAL CARE IF: °· You have a seizure. °· You develop a craving for drugs or alcohol. °· You begin to experience symptoms of withdrawal during your tapered withdrawal. °· You become very confused. °· You lose consciousness. °· You   have trouble breathing. °· You think about hurting yourself or someone else. °  °This information is not intended to replace advice given to you by your  health care provider. Make sure you discuss any questions you have with your health care provider. °  °Document Released: 06/19/2011 Document Revised: 07/21/2014 Document Reviewed: 12/20/2014 °Elsevier Interactive Patient Education ©2016 [employer] ° °

## 2015-11-01 NOTE — ED Provider Notes (Signed)
CSN: [id number]     Arrival date & time 11/01/15  1643 History   First MD Initiated Contact with Patient 11/01/15 2011     Chief Complaint  Patient presents with  . Numbness      HPI  Expand All Collapse All   Pt states she was discharged on Saturday with left sided weakness. States she went home and the symptoms started to get better and then she reports that since yesterday her eyes have felt tired, she feels groggy, cant taste and has forehead numbness. States that her psychiatrist prescribes Clonazepam and she hasnt been able to take it since Thursday because she ran out        Past Medical History  Diagnosis Date  . High cholesterol   . Anxiety   . Depression   . Humerus fracture   . Hypothyroidism   . Pulmonary embolism (HCC)   . History of chicken pox   . Bipolar disorder (HCC)     no psych  . GERD (gastroesophageal reflux disease)   . HLD (hyperlipidemia)   . Hypothyroid     previously hyperthyroid  . History of DVT (deep vein thrombosis)     on chronic coumadin since 1973, h/o blood clots on coumadin 2011, added ASA QD, but due to fall risk stopped and changed goal INR to 2.5-3.0  . Phlebitis     left leg stays swollen  . PAD (peripheral artery disease) (HCC)   . Alopecia     well controlled per previous PCP - improved with biotin  . Chronic sinusitis   . Glucose intolerance (impaired glucose tolerance)   . Closed right humeral fracture 2011    coracoid nondisplaced, conservative therapy (Dr. Cleon Gustin)  . Closed left humeral fracture 2010    closed left humeral neck fracture, also h/o L wrist fx  . Hyperactivity of bladder   . Osteopenia 2010, 2016    DEXA 2010: T -1.3 L spine, -2.1 femoral neck. DEXA 2016: T -2.4 hip, T -0.5 spine; rpt 2 yrs  . Falls     completed HHPT 10/2011, declined outpt PT  . Anxiety     when around ppl  . Diverticulosis     by colonoscopy  . Positive ANA (antinuclear antibody)    Past Surgical History  Procedure Laterality Date   . Cesarean section    . Left heart catheterization with coronary angiogram N/A 09/05/2014    Procedure: LEFT HEART CATHETERIZATION WITH CORONARY ANGIOGRAM;  Surgeon: Marykay Lex, MD;  Location: [facility] CATH LAB;  Service: Cardiovascular;  Laterality: N/A;  . Cholecystectomy    . Cesarean section  1973  . Arterial thrombectomy  07/2009    with R mid popliteal artery angioplasty [balloon] (2011)[[[[  . Hospitalization  01/2007    depression, ECT  . Inguinal hernia repair  1980s    x2 on right  . Colonoscopy  08/2008    int/ext hem, severe diverticulosis with evidence of itis, rec rpt 10 yrs  . Cardiac catheterization  08/2014    nl EF, small branch OM not amenable to PCI [name])   Family History  Problem Relation Age of Onset  . Heart attack Mother 32  . Cancer Father   . Coronary artery disease Mother 37  . Hyperlipidemia Mother   . Heart disease Mother   . Hypertension Mother   . Breast cancer Maternal Aunt 54  . Coronary artery disease Maternal Uncle   . Coronary artery disease Maternal Grandmother 70  .  Stroke Neg Hx   . Diabetes Neg Hx   . Cancer Neg Hx    Social History  Substance Use Topics  . Smoking status: Never Smoker   . Smokeless tobacco: Never Used  . Alcohol Use: No   OB History    Gravida Para Term Preterm AB TAB SAB Ectopic Multiple Living   0 0 0 0 0 0 0 0       Review of Systems  All other systems reviewed and are negative.     Allergies  Codeine; Acyclovir and related; and Hydrocodone  Home Medications   Prior to Admission medications   Medication Sig Start Date End Date Taking? Authorizing Provider  acetaminophen (TYLENOL) 500 MG tablet Take 500 mg by mouth every 8 (eight) hours as needed for moderate pain.   Yes Historical Provider, MD  atorvastatin (LIPITOR) 10 MG tablet TAKE 1 TABLET AT BEDTIME 08/20/15  Yes Eustaquio Boyden, MD  Biotin 1000 MCG tablet Take 1,000 mcg by mouth 2 (two) times daily.     Yes Historical Provider, MD  buPROPion  (WELLBUTRIN SR) 200 MG 12 hr tablet TAKE 1 TABLET TWICE DAILY 08/20/15  Yes Audery Amel, MD  Calcium Carb-Cholecalciferol (CALCIUM-VITAMIN D) 600-400 MG-UNIT TABS Take 1 tablet by mouth daily.   Yes Historical Provider, MD  Cholecalciferol (VITAMIN D) 2000 UNITS CAPS Take 1 capsule (2,000 Units total) by mouth daily. 08/05/11  Yes Eustaquio Boyden, MD  Docusate Calcium (STOOL SOFTENER PO) Take 1 tablet by mouth daily as needed (constipation).    Yes Historical Provider, MD  lamoTRIgine (LAMICTAL) 100 MG tablet Take 0.5 tablets (50 mg total) by mouth 2 (two) times daily. 02/01/15  Yes Audery Amel, MD  levothyroxine (SYNTHROID, LEVOTHROID) 50 MCG tablet TAKE 1 TABLET EVERY DAY 10/01/15  Yes Eustaquio Boyden, MD  PRESCRIPTION MEDICATION Place 2 drops into both eyes daily as needed (dryness).   Yes Historical Provider, MD  warfarin (COUMADIN) 5 MG tablet Take 5 mg by mouth daily.   Yes Historical Provider, MD  clonazePAM (KLONOPIN) 1 MG tablet Take 1 tablet (1 mg total) by mouth 2 (two) times daily. 11/01/15   Nelva Nay, MD   BP 136/86 mmHg  Pulse 80  Temp(Src) 98.8 F (37.1 C) (Oral)  Resp 20  SpO2 97% Physical Exam  Constitutional: She is oriented to person, place, and time. She appears well-developed and well-nourished. No distress.  HENT:  Head: Normocephalic and atraumatic.  Eyes: Pupils are equal, round, and reactive to light.  Neck: Normal range of motion.  Cardiovascular: Normal rate and intact distal pulses.   Pulmonary/Chest: No respiratory distress.  Abdominal: Normal appearance. She exhibits no distension.  Musculoskeletal: Normal range of motion.  Neurological: She is alert and oriented to person, place, and time. A sensory deficit (bilateral face.  No other deficits) is present. No cranial nerve deficit. GCS eye subscore is 4. GCS verbal subscore is 5. GCS motor subscore is 6.  Skin: Skin is warm and dry. No rash noted.  Psychiatric: She has a normal mood and affect. Her  behavior is normal.  Nursing note and vitals reviewed.   ED Course  Procedures (including critical care time) Medications  clonazePAM (KLONOPIN) tablet 1 mg (not administered)    Labs Review Labs Reviewed  PROTIME-INR - Abnormal; Notable for the following:    Prothrombin Time 20.8 (*)    INR 1.80 (*)    All other components within normal limits  BASIC METABOLIC PANEL  CBC WITH DIFFERENTIAL/PLATELET  Rosezena SensorI-STAT TROPOININ, ED    Imaging Review Ct Head Wo Contrast  11/01/2015  CLINICAL DATA:  Headache at back of head beginning yesterday or today V 4 per patient, on blood thinners, chronic sinusitis EXAM: CT HEAD WITHOUT CONTRAST TECHNIQUE: Contiguous axial images were obtained from the base of the skull through the vertex without intravenous contrast. COMPARISON:  10/27/2015 FINDINGS: Generalized atrophy. Normal ventricular morphology. No midline shift or mass effect. Otherwise normal appearance of brain parenchyma. No intracranial hemorrhage, mass lesion or evidence acute infarction. No extra-axial fluid collections. Visualized paranasal sinuses and mastoid air cells clear. Suspected incomplete posterior arch C1. Bones otherwise unremarkable. Atherosclerotic calcifications at the carotid siphons bilaterally. IMPRESSION: Generalized atrophy. No acute intracranial abnormalities. Electronically Signed   By: Ulyses SouthwardMark  Boles M.D.   On: 11/01/2015 21:43   I have personally reviewed and evaluated these images and lab results as part of my medical decision-making.   EKG Interpretation   Date/Time:  Thursday November 01 2015 16:49:56 EDT Ventricular Rate:  80 PR Interval:  244 QRS Duration: 84 QT Interval:  368 QTC Calculation: 424 R Axis:   -26 Text Interpretation:  Sinus rhythm with 1st degree A-V block Minimal  voltage criteria for LVH, may be normal variant Possible Anterior infarct  , age undetermined Abnormal ECG No significant change since last tracing  Confirmed by Saniya Tranchina  MD, Vaishnav Demartin  [phone]([id number]) on 11/01/2015 8:12:29 PM      MDM   Final diagnoses:  Benzodiazepine withdrawal, uncomplicated (HCC)        Nelva Nayobert Kirk Sampley, MD 11/01/15 2256

## 2015-11-01 NOTE — Telephone Encounter (Signed)
Pt was seen [facility] on 10/11/15.

## 2015-11-01 NOTE — ED Notes (Signed)
Spoke to Dr Judd Lienelo regarding pt, no new orders at this time.

## 2015-11-01 NOTE — ED Notes (Signed)
Pt states she was discharged on Saturday with left sided weakness. States she went home and the symptoms started to get better and then she reports that since yesterday her eyes have felt tired, she feels groggy, cant taste and has forehead numbness. States that her psychiatrist prescribes Clonazepam and she hasnt been able to take it since Thursday because she ran out.

## 2015-11-05 ENCOUNTER — Ambulatory Visit (INDEPENDENT_AMBULATORY_CARE_PROVIDER_SITE_OTHER): Payer: Commercial Managed Care - HMO | Admitting: Psychiatry

## 2015-11-05 ENCOUNTER — Encounter: Payer: Self-pay | Admitting: Psychiatry

## 2015-11-05 VITALS — BP 122/86 | HR 85 | Temp 97.3°F | Ht <= 58 in | Wt 145.6 lb

## 2015-11-05 DIAGNOSIS — F411 Generalized anxiety disorder: Secondary | ICD-10-CM

## 2015-11-05 DIAGNOSIS — F333 Major depressive disorder, recurrent, severe with psychotic symptoms: Secondary | ICD-10-CM | POA: Diagnosis not present

## 2015-11-05 MED ORDER — BUPROPION HCL ER (SR) 200 MG PO TB12: 200.0000 mg | ORAL_TABLET | Freq: Two times a day (BID) | ORAL | Status: DC

## 2015-11-05 MED ORDER — LAMOTRIGINE 100 MG PO TABS: 100.0000 mg | ORAL_TABLET | Freq: Two times a day (BID) | ORAL | Status: DC

## 2015-11-05 MED ORDER — CLONAZEPAM 1 MG PO TABS: 1.0000 mg | ORAL_TABLET | Freq: Two times a day (BID) | ORAL | Status: DC

## 2015-11-05 NOTE — Progress Notes (Signed)
BH MD/PA/NP OP Progress Note  11/05/2015 5:44 PM Kathryn Mathews  MRN:  [id number]  Chief Complaint:  Chief Complaint    Follow-up; Medication Refill     Subjective:  Patient reports she has been anxious recently. Had a spell of running out of her medicine. Also a little bit more down. Similar complaints to usual. Complaining about her daughter-in-law. Denies suicidal thoughts. Does not appear to be psychotic. HPI: Long-standing problems with anxiety and depression. No suicidal behavior no evidence psychosis. Partial response to medication. Lots of psychosocial complaints often about her family. Tends to be rather avoidant. Visit Diagnosis:    ICD-9-CM ICD-10-CM   1. Major psychotic depression, recurrent (HCC) 296.34 F33.3   2. Generalized anxiety disorder 300.02 F41.1     Past Psychiatric History: Tree of chronic anxiety and depression partial response to medication remains modestly on functional  Past Medical History:  Past Medical History  Diagnosis Date  . High cholesterol   . Anxiety   . Depression   . Humerus fracture   . Hypothyroidism   . Pulmonary embolism (HCC)   . History of chicken pox   . Bipolar disorder (HCC)     no psych  . GERD (gastroesophageal reflux disease)   . HLD (hyperlipidemia)   . Hypothyroid     previously hyperthyroid  . History of DVT (deep vein thrombosis)     on chronic coumadin since 1973, h/o blood clots on coumadin 2011, added ASA QD, but due to fall risk stopped and changed goal INR to 2.5-3.0  . Phlebitis     left leg stays swollen  . PAD (peripheral artery disease) (HCC)   . Alopecia     well controlled per previous PCP - improved with biotin  . Chronic sinusitis   . Glucose intolerance (impaired glucose tolerance)   . Closed right humeral fracture 2011    coracoid nondisplaced, conservative therapy (Dr. Cleon Gustin)  . Closed left humeral fracture 2010    closed left humeral neck fracture, also h/o L wrist fx  . Hyperactivity of  bladder   . Osteopenia 2010, 2016    DEXA 2010: T -1.3 L spine, -2.1 femoral neck. DEXA 2016: T -2.4 hip, T -0.5 spine; rpt 2 yrs  . Falls     completed HHPT 10/2011, declined outpt PT  . Anxiety     when around ppl  . Diverticulosis     by colonoscopy  . Positive ANA (antinuclear antibody)     Past Surgical History  Procedure Laterality Date  . Cesarean section    . Left heart catheterization with coronary angiogram N/A 09/05/2014    Procedure: LEFT HEART CATHETERIZATION WITH CORONARY ANGIOGRAM;  Surgeon: Marykay Lex, MD;  Location: [facility] CATH LAB;  Service: Cardiovascular;  Laterality: N/A;  . Cholecystectomy    . Cesarean section  1973  . Arterial thrombectomy  07/2009    with R mid popliteal artery angioplasty [balloon] (2011)[[[[  . Hospitalization  01/2007    depression, ECT  . Inguinal hernia repair  1980s    x2 on right  . Colonoscopy  08/2008    int/ext hem, severe diverticulosis with evidence of itis, rec rpt 10 yrs  . Cardiac catheterization  08/2014    nl EF, small branch OM not amenable to PCI [name])    Family Psychiatric History: No known family history identified  Family History:  Family History  Problem Relation Age of Onset  . Heart attack Mother 13  . Cancer Father   .  Coronary artery disease Mother 60  . Hyperlipidemia Mother   . Heart disease Mother   . Hypertension Mother   . Breast cancer Maternal Aunt 54  . Coronary artery disease Maternal Uncle   . Coronary artery disease Maternal Grandmother 70  . Stroke Neg Hx   . Diabetes Neg Hx   . Cancer Neg Hx     Social History:  Social History   Social History  . Marital Status: Married    Spouse Name: N/A  . Number of Children: N/A  . Years of Education: N/A   Occupational History  .      retired   Social History Main Topics  . Smoking status: Never Smoker   . Smokeless tobacco: Never Used  . Alcohol Use: No  . Drug Use: No  . Sexual Activity: No   Other Topics Concern  . None    Social History Narrative   Caffeine: 2 diet coke/day   Born: [place], [place]   Lives with husband, 1 cat.   1 grown son - lives nearby.   [place]oved from [place].   Activity: no regular exercise   Diet: good water, fruits/vegetables daily    Allergies:  Allergies  Allergen Reactions  . Codeine Nausea And Vomiting  . Acyclovir And Related Nausea Only  . Hydrocodone Other (See Comments)    Imbalance/falls    Metabolic Disorder Labs: Lab Results  Component Value Date   HGBA1C 5.4 10/27/2015   MPG 108 10/27/2015   No results found for: PROLACTIN Lab Results  Component Value Date   CHOL 153 09/02/2014   TRIG 138 09/02/2014   HDL 41 09/02/2014   CHOLHDL 3.7 09/02/2014   VLDL 28 09/02/2014   LDLCALC 84 09/02/2014   LDLCALC 84 09/02/2014     Current Medications: Current Outpatient Prescriptions  Medication Sig Dispense Refill  . acetaminophen (TYLENOL) 500 MG tablet Take 500 mg by mouth every 8 (eight) hours as needed for moderate pain.    Marland Kitchen atorvastatin (LIPITOR) 10 MG tablet TAKE 1 TABLET AT BEDTIME 90 tablet 3  . Biotin 1000 MCG tablet Take 1,000 mcg by mouth 2 (two) times daily.      Marland Kitchen buPROPion (WELLBUTRIN SR) 200 MG 12 hr tablet Take 1 tablet (200 mg total) by mouth 2 (two) times daily. 180 tablet 1  . Calcium Carb-Cholecalciferol (CALCIUM-VITAMIN D) 600-400 MG-UNIT TABS Take 1 tablet by mouth daily.    . Cholecalciferol (VITAMIN D) 2000 UNITS CAPS Take 1 capsule (2,000 Units total) by mouth daily.    . clonazePAM (KLONOPIN) 1 MG tablet Take 1 tablet (1 mg total) by mouth 2 (two) times daily. 180 tablet 1  . lamoTRIgine (LAMICTAL) 100 MG tablet Take 1 tablet (100 mg total) by mouth 2 (two) times daily. 180 tablet 1  . levothyroxine (SYNTHROID, LEVOTHROID) 50 MCG tablet TAKE 1 TABLET EVERY DAY 90 tablet 2  . PRESCRIPTION MEDICATION Place 2 drops into both eyes daily as needed (dryness).    . warfarin (COUMADIN) 5 MG tablet Take 5 mg by mouth daily.    Tery Sanfilippo Calcium  (STOOL SOFTENER PO) Take 1 tablet by mouth daily as needed (constipation). Reported on 11/05/2015     No current facility-administered medications for this visit.    Neurologic: Headache: Negative Seizure: Negative Paresthesias: Negative  Musculoskeletal: Strength & Muscle Tone: decreased Gait & Station: normal Patient leans: N/A  Psychiatric Specialty Exam: ROS  Blood pressure 122/86, pulse 85, temperature 97.3 F (36.3 C), temperature source Tympanic,  height 4\' 8"  (1.422 m), weight 145 lb 9.6 oz (66.044 kg), SpO2 94 %.Body mass index is 32.66 kg/(m^2).  General Appearance: Fairly Groomed  Eye Contact:  Fair  Speech:  Normal Rate  Volume:  Decreased  Mood:  Depressed  Affect:  Constricted  Thought Process:  Goal Directed  Orientation:  Full (Time, Place, and Person)  Thought Content:  Negative  Suicidal Thoughts:  No  Homicidal Thoughts:  No  Memory:  Immediate;   Good Recent;   Good Remote;   Good  Judgement:  Fair  Insight:  Fair  Psychomotor Activity:  Decreased  Concentration:  Fair  Recall:  Fair  Fund of Knowledge: Fair  Language: Fair  Akathisia:  No  Handed:  Right  AIMS (if indicated):  None  Assets:  Communication Skills Desire for Improvement Financial Resources/Insurance Housing Resilience Social Support  ADL's:  Intact  Cognition: WNL  Sleep:  Good     Treatment Plan Summary:Medication management and Plan at her request we will increase the lamotrigine back to 100 mg twice a day. She insists that this dose was previously effective for her although I'm not sure that she is remembering that correctly. We discussed side effects of medicine. Continue the current Klonopin. I have called in prescriptions to her long-term pharmacy and try to make all the arrangements for that. I would like to see her back in 3 months to see if her mood is getting any better.   Mordecai RasmussenJohn Jullianna Gabor, MD 11/05/2015, 5:44 PM

## 2015-11-15 ENCOUNTER — Ambulatory Visit (INDEPENDENT_AMBULATORY_CARE_PROVIDER_SITE_OTHER): Payer: Commercial Managed Care - HMO | Admitting: *Deleted

## 2015-11-15 ENCOUNTER — Encounter: Payer: Self-pay | Admitting: Family Medicine

## 2015-11-15 ENCOUNTER — Ambulatory Visit (INDEPENDENT_AMBULATORY_CARE_PROVIDER_SITE_OTHER): Payer: Commercial Managed Care - HMO | Admitting: Family Medicine

## 2015-11-15 VITALS — BP 116/80 | HR 81 | Temp 98.0°F | Wt 144.0 lb

## 2015-11-15 DIAGNOSIS — Z5181 Encounter for therapeutic drug level monitoring: Secondary | ICD-10-CM

## 2015-11-15 DIAGNOSIS — E785 Hyperlipidemia, unspecified: Secondary | ICD-10-CM

## 2015-11-15 DIAGNOSIS — G459 Transient cerebral ischemic attack, unspecified: Secondary | ICD-10-CM | POA: Diagnosis not present

## 2015-11-15 DIAGNOSIS — I739 Peripheral vascular disease, unspecified: Secondary | ICD-10-CM | POA: Diagnosis not present

## 2015-11-15 DIAGNOSIS — Z86718 Personal history of other venous thrombosis and embolism: Secondary | ICD-10-CM | POA: Diagnosis not present

## 2015-11-15 LAB — POCT INR: INR: 2.3

## 2015-11-15 NOTE — Progress Notes (Signed)
Pre visit review using our clinic review tool, if applicable. No additional management support is needed unless otherwise documented below in the visit note. 

## 2015-11-15 NOTE — Progress Notes (Signed)
BP 116/80 mmHg  Pulse 81  Temp(Src) 98 F (36.7 C) (Oral)  Wt 144 lb (65.318 kg)  SpO2 98%   CC: hosp f/u visit  Subjective:    Patient ID: Kathryn Mathews, female    DOB: [birth year], 70 y.o.   MRN: [id number]  HPI: Kathryn ShownSharon J Ohnemus is a 70 y.o. female presenting on 11/15/2015 for Hospitalization Follow-up   Pt not fasting today. Last saw Dr Toni Amendlapacs last week - lamotrigine increased to 100mg  bid.   Recent hospitalization for TIA last month. This presented with L lip, arm and leg numbness. INR was 3.28. CT and MRI were normal (incidental bilateral subdural hygromas and nonspecific white matter disease). Carotid duplex showed 1-39% bilateral ICA plaque with normal vertebral artery flow. Echocardiogram was normal. Was able to be discharged in time to spend Easter at home. Neurology felt pt had embolic TIA with source not found.   Found to have no PT or OT needs at home. Passed bedside swallow.   Recommended f/u with neurology in 1 month (Dr Lucia GaskinsAhern). Due for FLP. New LDL goal <70. Lipitor increased from 10mg  to 40mg  daily.  Outside of window for TCM visit. Admit date: 10/27/2015 Discharge date: 10/27/2015 F/u phone call: 10/29/2015.  Admission Diagnoses:Transient ischemic attack Discharge Diagnoses:  Principal Problem:  TIA (transient ischemic attack) Active Problems:  Bipolar disorder (HCC)  GERD (gastroesophageal reflux disease)  HLD (hyperlipidemia)  Hypothyroid  History of DVT (deep vein thrombosis)  Essential hypertension  Stroke-like symptoms  Extremity numbness Discharged Condition: Good  Relevant past medical, surgical, family and social history reviewed and updated as indicated. Interim medical history since our last visit reviewed. Allergies and medications reviewed and updated. Current Outpatient Prescriptions on File Prior to Visit  Medication Sig  . acetaminophen (TYLENOL) 500 MG tablet Take 500 mg by mouth every 8 (eight) hours as needed for moderate  pain.  Marland Kitchen. atorvastatin (LIPITOR) 10 MG tablet TAKE 1 TABLET AT BEDTIME  . Biotin 1000 MCG tablet Take 1,000 mcg by mouth 2 (two) times daily.    Marland Kitchen. buPROPion (WELLBUTRIN SR) 200 MG 12 hr tablet Take 1 tablet (200 mg total) by mouth 2 (two) times daily.  . Calcium Carb-Cholecalciferol (CALCIUM-VITAMIN D) 600-400 MG-UNIT TABS Take 1 tablet by mouth daily.  . Cholecalciferol (VITAMIN D) 2000 UNITS CAPS Take 1 capsule (2,000 Units total) by mouth daily.  . clonazePAM (KLONOPIN) 1 MG tablet Take 1 tablet (1 mg total) by mouth 2 (two) times daily.  Tery Sanfilippo. Docusate Calcium (STOOL SOFTENER PO) Take 1 tablet by mouth daily as needed (constipation). Reported on 11/05/2015  . lamoTRIgine (LAMICTAL) 100 MG tablet Take 1 tablet (100 mg total) by mouth 2 (two) times daily.  Marland Kitchen. levothyroxine (SYNTHROID, LEVOTHROID) 50 MCG tablet TAKE 1 TABLET EVERY DAY  . PRESCRIPTION MEDICATION Place 2 drops into both eyes daily as needed (dryness).  . warfarin (COUMADIN) 5 MG tablet Take 5 mg by mouth daily.   No current facility-administered medications on file prior to visit.    Review of Systems Per HPI unless specifically indicated in ROS section     Objective:    BP 116/80 mmHg  Pulse 81  Temp(Src) 98 F (36.7 C) (Oral)  Wt 144 lb (65.318 kg)  SpO2 98%  Wt Readings from Last 3 Encounters:  11/15/15 144 lb (65.318 kg)  11/05/15 145 lb 9.6 oz (66.044 kg)  10/27/15 144 lb 6.4 oz (65.499 kg)    Physical Exam  Constitutional: She appears well-developed and well-nourished.  No distress.  HENT:  Mouth/Throat: Oropharynx is clear and moist. No oropharyngeal exudate.  Eyes: Conjunctivae and EOM are normal. Pupils are equal, round, and reactive to light.  Neck: Normal range of motion. Neck supple. No thyromegaly present.  Cardiovascular: Normal rate, regular rhythm, normal heart sounds and intact distal pulses.   No murmur heard. Pulmonary/Chest: Effort normal and breath sounds normal. No respiratory distress. She has  no wheezes. She has no rales.  Musculoskeletal: She exhibits no edema.  Lymphadenopathy:    She has no cervical adenopathy.  Skin: Skin is warm and dry. No rash noted.  Psychiatric: She has a normal mood and affect.  Nursing note and vitals reviewed.  Results for orders placed or performed during the hospital encounter of 11/01/15  Basic metabolic panel  Result Value Ref Range   Sodium 140 135 - 145 mmol/L   Potassium 4.3 3.5 - 5.1 mmol/L   Chloride 106 101 - 111 mmol/L   CO2 23 22 - 32 mmol/L   Glucose, Bld 98 65 - 99 mg/dL   BUN 10 6 - 20 mg/dL   Creatinine, Ser 9.14 0.44 - 1.00 mg/dL   Calcium 9.8 8.9 - 78.2 mg/dL   GFR calc non Af Amer >60 >60 mL/min   GFR calc Af Amer >60 >60 mL/min   Anion gap 11 5 - 15  CBC with Differential/Platelet  Result Value Ref Range   WBC 7.1 4.0 - 10.5 K/uL   RBC 4.65 3.87 - 5.11 MIL/uL   Hemoglobin 14.3 12.0 - 15.0 g/dL   HCT 95.6 21.3 - 08.6 %   MCV 95.9 78.0 - 100.0 fL   MCH 30.8 26.0 - 34.0 pg   MCHC 32.1 30.0 - 36.0 g/dL   RDW 57.8 46.9 - 62.9 %   Platelets 180 150 - 400 K/uL   Neutrophils Relative % 68 %   Neutro Abs 4.8 1.7 - 7.7 K/uL   Lymphocytes Relative 26 %   Lymphs Abs 1.9 0.7 - 4.0 K/uL   Monocytes Relative 5 %   Monocytes Absolute 0.4 0.1 - 1.0 K/uL   Eosinophils Relative 1 %   Eosinophils Absolute 0.1 0.0 - 0.7 K/uL   Basophils Relative 0 %   Basophils Absolute 0.0 0.0 - 0.1 K/uL  Protime-INR  Result Value Ref Range   Prothrombin Time 20.8 (H) 11.6 - 15.2 seconds   INR 1.80 (H) 0.00 - 1.49  I-stat troponin, ED  Result Value Ref Range   Troponin i, poc 0.00 0.00 - 0.08 ng/mL   Comment 3           Lab Results  Component Value Date   CHOL 153 09/02/2014   HDL 41 09/02/2014   LDLCALC 84 09/02/2014   LDLDIRECT 81.0 04/03/2015   TRIG 138 09/02/2014   CHOLHDL 3.7 09/02/2014    Lab Results  Component Value Date   HGBA1C 5.4 10/27/2015   Lab Results  Component Value Date   TSH 3.112 10/27/2015     Lab Results   Component Value Date   INR 2.3 11/15/2015   INR 1.80* 11/01/2015   INR 1.8 10/29/2015       Assessment & Plan:  Over 25 minutes were spent face-to-face with the patient during this encounter and >50% of that time was spent on counseling and coordination of care  Problem List Items Addressed This Visit    HLD (hyperlipidemia)    Patient will return tomorrow to update FLP. lipitor increased from  to   during recent hospitalization.      Relevant Orders   Lipid panel   TIA (transient ischemic attack) - Primary    Reviewed recent hospitalization. TIA presented with left sided leg, arm, facial numbness. Imaging overall unrevealing. Coumadin continued. To get INR checked today. Has f/u scheduled with neurology later this month. Will see if addition of ASA is indicated.           Follow up plan: Return in about 5 months (around 04/16/2016), or as needed, for medicare wellness visit.  Eustaquio Boyden, MD

## 2015-11-15 NOTE — Assessment & Plan Note (Signed)
Patient will return tomorrow to update FLP. lipitor increased from 10mg  to 40mg  during recent hospitalization.

## 2015-11-15 NOTE — Patient Instructions (Addendum)
Return tomorrow fasting after midnight to check cholesterol (lab visit only).  Keep appointment with neurology 5/17.  You are doing well today.  Return in 5 months for wellness visit

## 2015-11-15 NOTE — Assessment & Plan Note (Addendum)
Reviewed recent hospitalization. TIA presented with left sided leg, arm, facial numbness. Imaging overall unrevealing. Coumadin continued. To get INR checked today. Has f/u scheduled with neurology later this month. Will see if addition of ASA is indicated.

## 2015-11-16 ENCOUNTER — Other Ambulatory Visit (INDEPENDENT_AMBULATORY_CARE_PROVIDER_SITE_OTHER): Payer: Commercial Managed Care - HMO

## 2015-11-16 DIAGNOSIS — E785 Hyperlipidemia, unspecified: Secondary | ICD-10-CM | POA: Diagnosis not present

## 2015-11-16 LAB — LIPID PANEL
CHOL/HDL RATIO: 4
Cholesterol: 194 mg/dL (ref 0–200)
HDL: 54.8 mg/dL (ref >39.00)
LDL Cholesterol: 106 mg/dL — ABNORMAL HIGH (ref 0–99)
NONHDL: 139.42
Triglycerides: 165 mg/dL — ABNORMAL HIGH (ref 0.0–149.0)
VLDL: 33 mg/dL (ref 0.0–40.0)

## 2015-11-27 NOTE — Telephone Encounter (Signed)
PT HAD APPT 11-05-15 FOR MEDICATION REFILLS

## 2015-11-28 ENCOUNTER — Encounter: Payer: Self-pay | Admitting: Neurology

## 2015-11-28 ENCOUNTER — Ambulatory Visit (INDEPENDENT_AMBULATORY_CARE_PROVIDER_SITE_OTHER): Payer: Commercial Managed Care - HMO | Admitting: Neurology

## 2015-11-28 VITALS — BP 128/75 | HR 75 | Ht <= 58 in | Wt 145.2 lb

## 2015-11-28 DIAGNOSIS — G458 Other transient cerebral ischemic attacks and related syndromes: Secondary | ICD-10-CM | POA: Diagnosis not present

## 2015-11-28 MED ORDER — ATORVASTATIN CALCIUM 20 MG PO TABS: 20.0000 mg | ORAL_TABLET | Freq: Every day | ORAL | Status: DC

## 2015-11-28 NOTE — Patient Instructions (Signed)
Remember to drink plenty of fluid, eat healthy meals and do not skip any meals. Try to eat protein with a every meal and eat a healthy snack such as fruit or nuts in between meals. Try to keep a regular sleep-wake schedule and try to exercise daily, particularly in the form of walking, 20-30 minutes a day, if you can.   As far as your medications are concerned, I would like to suggest: Continue current medications, increased the Lipitor  I would like to see you back as needed, sooner if we need to. Please call us with any interim questions, concerns, problems, updates or refill requests.   Our phone number is [phone]. We also have an after hours call service for urgent matters and there is a physician on-call for urgent questions. For any emergencies you know to call 911 or go to the nearest emergency room

## 2015-11-28 NOTE — Progress Notes (Signed)
[facility]    Provider:  Dr Lucia Gaskins Referring Provider: Eustaquio Boyden, MD Primary Care Physician:  Eustaquio Boyden, MD  CC:  TIA  HPI:  Kathryn Mathews is a 70 y.o. female here as a referral from Dr. Sharen Hones for left-sided tingling involving the left leg and left arm and left lip. She's on chronic anticoagulation with Coumadin. [facility] was [facility] in [facility] and workup unremarkable including negative MRI of the brain. She was not given TPA.  She denies today it was numbness. She was in bed and noticed it, she was relaxing, it was late after 12am without any inciting events.  In the ED her NIHSS was 1 and then resolved quickly. She is here for follow up. She has not had the symptoms again. They were seen in the emergency room the second time on April 20th because she ran out of clonazepam on April 20th. She denies any repeat symptoms since then.  Denies any speech changes, or facial droop, or dysphagia, No motor deficiencies,  No headaches, dizziness, vertigo, nausea or vomiting. Denies seizures, bowel or urine incontinence. She denies any prior strokes or TIAs. Denies fevers, chills, night sweats, vision changes. Denies any respiratory complaints. Denies any chest pain or palpitations. No history of migraine or seizure.  Reviewed notes, labs and imaging from outside physicians, which showed:  Ct Head Wo Contrast 10/27/2015  No acute findings. Moderate generalized atrophy.    MRI / MRA Head 10/27/2015 No acute stroke is documented. Mild atrophy with incidental BILATERAL subdural hygromas and minor nonspecific white matter disease.  No significant finding on MRA intracranial.  LDL 106  HgbA1c 5.4  Carotid Dopplers: Bilateral: intimal wall thickening CCA. Mild soft plaque origin ICA. 1-39% ICA plaquing. Vertebral artery flow is antegrade.  Echo: Systolic function was  normal. The estimated ejection fraction was in the range of 55%  to 60%. Wall motion was  normal; there were no regional wall  motion abnormalities. There was an increased relative  contribution of atrial contraction to ventricular filling, which  may be seen with aging. Left ventricular diastolic function parameters were normal. No clot found.   Review of Systems: Patient complains of symptoms per HPI as well as the following symptoms: No CP, no SOB. Pertinent negatives per HPI. All others negative.   Social History   Social History  . Marital Status: Married    Spouse Name: N/A  . Number of Children: N/A  . Years of Education: N/A   Occupational History  .      retired   Social History Main Topics  . Smoking status: Never Smoker   . Smokeless tobacco: Never Used  . Alcohol Use: No  . Drug Use: No  . Sexual Activity: No   Other Topics Concern  . Not on file   Social History Narrative   Caffeine: 2 diet coke/day   Born: [place], [place]   Lives with husband, 1 cat.   1 grown son - lives nearby.   [place]oved from [place].   Activity: no regular exercise   Diet: good water, fruits/vegetables daily    Family History  Problem Relation Age of Onset  . Heart attack Mother 27  . Cancer Father   . Coronary artery disease Mother 50  . Hyperlipidemia Mother   . Heart disease Mother   . Hypertension Mother   . Breast cancer Maternal Aunt 54  . Coronary artery disease Maternal Uncle   . Coronary artery disease Maternal Grandmother 70  .  Stroke Neg Hx   . Diabetes Neg Hx   . Cancer Neg Hx     Past Medical History  Diagnosis Date  . High cholesterol   . Anxiety   . Depression   . Humerus fracture   . Hypothyroidism   . Pulmonary embolism (HCC)   . History of chicken pox   . Bipolar disorder (HCC)     no psych  . GERD (gastroesophageal reflux disease)   . HLD (hyperlipidemia)   . Hypothyroid     previously hyperthyroid  . History of DVT (deep vein thrombosis)     on chronic coumadin since 1973, h/o blood clots on coumadin 2011, added ASA QD, but due to  fall risk stopped and changed goal INR to 2.5-3.0  . Phlebitis     left leg stays swollen  . PAD (peripheral artery disease) (HCC)   . Alopecia     well controlled per previous PCP - improved with biotin  . Chronic sinusitis   . Glucose intolerance (impaired glucose tolerance)   . Closed right humeral fracture 2011    coracoid nondisplaced, conservative therapy (Dr. Cleon Gustin)  . Closed left humeral fracture 2010    closed left humeral neck fracture, also h/o L wrist fx  . Hyperactivity of bladder   . Osteopenia 2010, 2016    DEXA 2010: T -1.3 L spine, -2.1 femoral neck. DEXA 2016: T -2.4 hip, T -0.5 spine; rpt 2 yrs  . Falls     completed HHPT 10/2011, declined outpt PT  . Anxiety     when around ppl  . Diverticulosis     by colonoscopy  . Positive ANA (antinuclear antibody)     Past Surgical History  Procedure Laterality Date  . Cesarean section    . Left heart catheterization with coronary angiogram N/A 09/05/2014    Procedure: LEFT HEART CATHETERIZATION WITH CORONARY ANGIOGRAM;  Surgeon: Marykay Lex, MD;  Location: [facility] CATH LAB;  Service: Cardiovascular;  Laterality: N/A;  . Cholecystectomy    . Cesarean section  1973  . Arterial thrombectomy  07/2009    with R mid popliteal artery angioplasty [balloon] (2011)[[[[  . Hospitalization  01/2007    depression, ECT  . Inguinal hernia repair  1980s    x2 on right  . Colonoscopy  08/2008    int/ext hem, severe diverticulosis with evidence of itis, rec rpt 10 yrs  . Cardiac catheterization  08/2014    nl EF, small branch OM not amenable to PCI [name])    Current Outpatient Prescriptions  Medication Sig Dispense Refill  . acetaminophen (TYLENOL) 500 MG tablet Take 500 mg by mouth every 8 (eight) hours as needed for moderate pain.    Marland Kitchen atorvastatin (LIPITOR) 10 MG tablet TAKE 1 TABLET AT BEDTIME 90 tablet 3  . Biotin 1000 MCG tablet Take 1,000 mcg by mouth 2 (two) times daily.      Marland Kitchen buPROPion (WELLBUTRIN SR) 200 MG 12 hr  tablet Take 1 tablet (200 mg total) by mouth 2 (two) times daily. 180 tablet 1  . Calcium Carb-Cholecalciferol (CALCIUM-VITAMIN D) 600-400 MG-UNIT TABS Take 1 tablet by mouth daily.    . Cholecalciferol (VITAMIN D) 2000 UNITS CAPS Take 1 capsule (2,000 Units total) by mouth daily.    . clonazePAM (KLONOPIN) 1 MG tablet Take 1 tablet (1 mg total) by mouth 2 (two) times daily. 180 tablet 1  . Docusate Calcium (STOOL SOFTENER PO) Take 1 tablet by mouth daily as needed (constipation). Reported  on 11/05/2015    . lamoTRIgine (LAMICTAL) 100 MG tablet Take 1 tablet (100 mg total) by mouth 2 (two) times daily. 180 tablet 1  . levothyroxine (SYNTHROID, LEVOTHROID) 50 MCG tablet TAKE 1 TABLET EVERY DAY 90 tablet 2  . NAPROXEN PO     . PRESCRIPTION MEDICATION Place 2 drops into both eyes daily as needed (dryness).    . warfarin (COUMADIN) 5 MG tablet Take 5 mg by mouth daily.     No current facility-administered medications for this visit.    Allergies as of 11/28/2015 - Review Complete 11/28/2015  Allergen Reaction Noted  . Codeine Nausea And Vomiting 09/01/2014  . Acyclovir and related Nausea Only 12/07/2014  . Hydrocodone Other (See Comments) 03/28/2014    Vitals: BP 128/75 mmHg  Pulse 75  Ht 4\' 8"  (1.422 m)  Wt 145 lb 3.2 oz (65.862 kg)  BMI 32.57 kg/m2 Last Weight:  Wt Readings from Last 1 Encounters:  11/28/15 145 lb 3.2 oz (65.862 kg)   Last Height:   Ht Readings from Last 1 Encounters:  11/28/15 4\' 8"  (1.422 m)   Physical exam: Exam: Gen: NAD, conversant, well nourised, obese, well groomed                     CV: RRR, no MRG. No Carotid Bruits. No peripheral edema, warm, nontender Eyes: Conjunctivae clear without exudates or hemorrhage  Neuro: Detailed Neurologic Exam  Speech:    Speech is normal; fluent and spontaneous with normal comprehension.  Cognition:    The patient is oriented to person, place, and time;     recent and remote memory intact;     language fluent;      normal attention, concentration,     fund of knowledge Cranial Nerves:    The pupils are equal, round, and reactive to light. Attempted fundoscopic exam could not visualize.  Visual fields are full to finger confrontation. Extraocular movements are intact. Trigeminal sensation is intact and the muscles of mastication are normal. The face is symmetric. The palate elevates in the midline. Hearing intact. Voice is normal. Shoulder shrug is normal. The tongue has normal motion without fasciculations.   Coordination:    Normal finger to nose and heel to shin.  Gait:    Not ataxic  Motor Observation:    No asymmetry, no atrophy, and no involuntary movements noted. Tone:    Normal muscle tone.    Posture:    Posture is normal. normal erect    Strength:    Strength is V/V in the upper and lower limbs.      Sensation: intact to LT     Reflex Exam:  DTR's:    Deep tendon reflexes in the upper and lower extremities are normal bilaterally.   Toes:    The toes are downgoing bilaterally.   Clonus:    Clonus is absent.     ASSESSMENT/PLAN Ms. Frankey ShownSharon J Alan is a 70 y.o. female with history of previous DVT with pulmonary embolus, chronic Coumadin therapy (INR 3.28 on admission), bipolar disorder, hyperlipidemia, and history of falls presented with new onset numbness involving the left lip, arm and leg. She did not receive IV t-PA due to anticoagulation with Coumadin.  Suspected TIA  Resultant the patient is back to baseline.  MRI - no acute findings  MRA - normal MRA  Carotid Doppler - 1-39% ICA plaquing. Vertebral artery flow is antegrade.  2D Echo - EF 55-60%. No cardiac source of emboli  identified.  LDL 106  HgbA1c 5.4  Hyperlipidemia  Home meds: Lipitor 10 mg daily prior to admission   Increased to 20mg  LDL was 106 want it less than 70   I had a long d/w patient about her recent TIA, risk for recurrent stroke/TIAs, personally independently reviewed imaging  studies and stroke evaluation results and answered questions.Continue Warfarin and Lipitor for secondary stroke prevention and maintain strict control of hypertension with blood pressure goal below 130/90, diabetes with hemoglobin A1c goal below 6.5% and lipids with LDL cholesterol goal below 70 mg/dL.I also advised the patient to eat a healthy diet with plenty of whole grains, cereals, fruits and vegetables, exercise regularly and maintain ideal body weight .Followup in the future as necessary  Naomie Dean, MD [phone] [facility]

## 2015-12-13 ENCOUNTER — Ambulatory Visit (INDEPENDENT_AMBULATORY_CARE_PROVIDER_SITE_OTHER): Payer: Commercial Managed Care - HMO | Admitting: *Deleted

## 2015-12-13 DIAGNOSIS — Z86718 Personal history of other venous thrombosis and embolism: Secondary | ICD-10-CM

## 2015-12-13 DIAGNOSIS — I739 Peripheral vascular disease, unspecified: Secondary | ICD-10-CM | POA: Diagnosis not present

## 2015-12-13 DIAGNOSIS — Z5181 Encounter for therapeutic drug level monitoring: Secondary | ICD-10-CM | POA: Diagnosis not present

## 2015-12-13 LAB — POCT INR: INR: 2

## 2015-12-13 NOTE — Progress Notes (Signed)
Pre visit review using our clinic review tool, if applicable. No additional management support is needed unless otherwise documented below in the visit note. 

## 2016-01-09 NOTE — Telephone Encounter (Signed)
Done. Written out and faxed

## 2016-01-10 ENCOUNTER — Other Ambulatory Visit (INDEPENDENT_AMBULATORY_CARE_PROVIDER_SITE_OTHER): Payer: Commercial Managed Care - HMO

## 2016-01-10 DIAGNOSIS — Z86718 Personal history of other venous thrombosis and embolism: Secondary | ICD-10-CM | POA: Diagnosis not present

## 2016-01-10 LAB — POCT INR: INR: 1.5

## 2016-01-11 NOTE — Telephone Encounter (Signed)
Not sure what the problem is. Asking Shanda BumpsJessica to check on it. Thank you.

## 2016-01-24 ENCOUNTER — Other Ambulatory Visit (INDEPENDENT_AMBULATORY_CARE_PROVIDER_SITE_OTHER): Payer: Commercial Managed Care - HMO

## 2016-01-24 DIAGNOSIS — Z86718 Personal history of other venous thrombosis and embolism: Secondary | ICD-10-CM | POA: Diagnosis not present

## 2016-01-24 LAB — POCT INR: INR: 2.4

## 2016-02-04 ENCOUNTER — Ambulatory Visit (INDEPENDENT_AMBULATORY_CARE_PROVIDER_SITE_OTHER): Payer: Commercial Managed Care - HMO | Admitting: Psychiatry

## 2016-02-04 ENCOUNTER — Encounter: Payer: Self-pay | Admitting: Psychiatry

## 2016-02-04 VITALS — BP 134/82 | HR 81 | Temp 98.5°F | Ht <= 58 in | Wt 147.6 lb

## 2016-02-04 DIAGNOSIS — F333 Major depressive disorder, recurrent, severe with psychotic symptoms: Secondary | ICD-10-CM

## 2016-02-04 DIAGNOSIS — F411 Generalized anxiety disorder: Secondary | ICD-10-CM

## 2016-02-04 MED ORDER — BUPROPION HCL ER (SR) 200 MG PO TB12: 200.0000 mg | ORAL_TABLET | Freq: Two times a day (BID) | ORAL | 1 refills | Status: DC

## 2016-02-04 MED ORDER — SERTRALINE HCL 25 MG PO TABS: 50.0000 mg | ORAL_TABLET | Freq: Every day | ORAL | 1 refills | Status: DC

## 2016-02-04 MED ORDER — LAMOTRIGINE 100 MG PO TABS: 100.0000 mg | ORAL_TABLET | Freq: Two times a day (BID) | ORAL | 1 refills | Status: DC

## 2016-02-10 ENCOUNTER — Other Ambulatory Visit: Payer: Self-pay | Admitting: Family Medicine

## 2016-02-25 ENCOUNTER — Other Ambulatory Visit: Payer: Commercial Managed Care - HMO

## 2016-03-07 ENCOUNTER — Other Ambulatory Visit (INDEPENDENT_AMBULATORY_CARE_PROVIDER_SITE_OTHER): Payer: Commercial Managed Care - HMO

## 2016-03-07 DIAGNOSIS — Z23 Encounter for immunization: Secondary | ICD-10-CM

## 2016-03-07 DIAGNOSIS — Z86718 Personal history of other venous thrombosis and embolism: Secondary | ICD-10-CM

## 2016-03-07 LAB — POCT INR: INR: 2.1

## 2016-04-07 ENCOUNTER — Other Ambulatory Visit (INDEPENDENT_AMBULATORY_CARE_PROVIDER_SITE_OTHER): Payer: Commercial Managed Care - HMO

## 2016-04-07 DIAGNOSIS — Z86718 Personal history of other venous thrombosis and embolism: Secondary | ICD-10-CM

## 2016-04-07 LAB — POCT INR: INR: 2.1

## 2016-04-16 ENCOUNTER — Other Ambulatory Visit: Payer: Self-pay | Admitting: Family Medicine

## 2016-04-16 DIAGNOSIS — E559 Vitamin D deficiency, unspecified: Secondary | ICD-10-CM

## 2016-04-16 DIAGNOSIS — E039 Hypothyroidism, unspecified: Secondary | ICD-10-CM

## 2016-04-16 DIAGNOSIS — F3177 Bipolar disorder, in partial remission, most recent episode mixed: Secondary | ICD-10-CM

## 2016-04-16 DIAGNOSIS — E785 Hyperlipidemia, unspecified: Secondary | ICD-10-CM

## 2016-04-17 ENCOUNTER — Ambulatory Visit (INDEPENDENT_AMBULATORY_CARE_PROVIDER_SITE_OTHER): Payer: Commercial Managed Care - HMO

## 2016-04-17 VITALS — BP 112/70 | HR 67 | Temp 97.6°F | Ht <= 58 in | Wt 147.2 lb

## 2016-04-17 DIAGNOSIS — E785 Hyperlipidemia, unspecified: Secondary | ICD-10-CM | POA: Diagnosis not present

## 2016-04-17 DIAGNOSIS — F3177 Bipolar disorder, in partial remission, most recent episode mixed: Secondary | ICD-10-CM

## 2016-04-17 DIAGNOSIS — Z0000 Encounter for general adult medical examination without abnormal findings: Secondary | ICD-10-CM | POA: Diagnosis not present

## 2016-04-17 DIAGNOSIS — E559 Vitamin D deficiency, unspecified: Secondary | ICD-10-CM

## 2016-04-17 DIAGNOSIS — E039 Hypothyroidism, unspecified: Secondary | ICD-10-CM

## 2016-04-17 LAB — CBC WITH DIFFERENTIAL/PLATELET
BASOS PCT: 0.2 % (ref 0.0–3.0)
Basophils Absolute: 0 10*3/uL (ref 0.0–0.1)
EOS ABS: 0.2 10*3/uL (ref 0.0–0.7)
Eosinophils Relative: 3.4 % (ref 0.0–5.0)
HEMATOCRIT: 43.7 % (ref 36.0–46.0)
Hemoglobin: 14.7 g/dL (ref 12.0–15.0)
Lymphocytes Relative: 35.5 % (ref 12.0–46.0)
Lymphs Abs: 2.4 10*3/uL (ref 0.7–4.0)
MCHC: 33.6 g/dL (ref 30.0–36.0)
MCV: 94.5 fl (ref 78.0–100.0)
MONO ABS: 0.4 10*3/uL (ref 0.1–1.0)
Monocytes Relative: 5.6 % (ref 3.0–12.0)
NEUTROS ABS: 3.8 10*3/uL (ref 1.4–7.7)
Neutrophils Relative %: 55.3 % (ref 43.0–77.0)
PLATELETS: 218 10*3/uL (ref 150.0–400.0)
RBC: 4.62 Mil/uL (ref 3.87–5.11)
RDW: 13.6 % (ref 11.5–15.5)
WBC: 6.9 10*3/uL (ref 4.0–10.5)

## 2016-04-17 LAB — COMPREHENSIVE METABOLIC PANEL
ALBUMIN: 4.1 g/dL (ref 3.5–5.2)
ALT: 10 U/L (ref 0–35)
AST: 20 U/L (ref 0–37)
Alkaline Phosphatase: 58 U/L (ref 39–117)
BUN: 23 mg/dL (ref 6–23)
CALCIUM: 9.3 mg/dL (ref 8.4–10.5)
CHLORIDE: 105 meq/L (ref 96–112)
CO2: 27 meq/L (ref 19–32)
Creatinine, Ser: 1.03 mg/dL (ref 0.40–1.20)
GFR: 56.23 mL/min — AB (ref >60.00)
Glucose, Bld: 90 mg/dL (ref 70–99)
Potassium: 4.1 mEq/L (ref 3.5–5.1)
SODIUM: 140 meq/L (ref 135–145)
TOTAL PROTEIN: 7.3 g/dL (ref 6.0–8.3)
Total Bilirubin: 0.4 mg/dL (ref 0.2–1.2)

## 2016-04-17 LAB — LIPID PANEL
CHOL/HDL RATIO: 3
Cholesterol: 176 mg/dL (ref 0–200)
HDL: 53 mg/dL (ref >39.00)
LDL CALC: 92 mg/dL (ref 0–99)
NonHDL: 123.18
TRIGLYCERIDES: 155 mg/dL — AB (ref 0.0–149.0)
VLDL: 31 mg/dL (ref 0.0–40.0)

## 2016-04-17 LAB — VITAMIN D 25 HYDROXY (VIT D DEFICIENCY, FRACTURES): VITD: 31.41 ng/mL (ref 30.00–100.00)

## 2016-04-17 LAB — TSH: TSH: 3.13 u[IU]/mL (ref 0.35–4.50)

## 2016-04-17 NOTE — Progress Notes (Signed)
PCP notes:   Health maintenance:  No gaps identified. Reviewed Health Maintenance schedule.   Abnormal screenings:   Hearing - failed  Patient concerns:   Pt reports issues with insomnia at night. Pt states she falls asleep around 6 am and sleeps until 2 or 2:30PM.   Pt's spouse had some medication mgmt concerns. These were shared with PCP in another communication.  Nurse concerns:  Pt has increased risk of falls due to impaired balance and gait.   Next PCP appt:   04/24/16 @ 1245

## 2016-04-17 NOTE — Progress Notes (Signed)
Pre visit review using our clinic review tool, if applicable. No additional management support is needed unless otherwise documented below in the visit note. 

## 2016-04-17 NOTE — Progress Notes (Signed)
Subjective:   Kathryn Mathews is a 70 y.o. female who presents for Medicare Annual (Subsequent) preventive examination.  Review of Systems:  N/A Cardiac Risk Factors include: advanced age (>37men, >10 women);obesity (BMI >30kg/m2);dyslipidemia;hypertension     Objective:     Vitals: BP 112/70 (BP Location: Right Arm, Patient Position: Sitting, Cuff Size: Normal)   Pulse 67   Temp 97.6 F (36.4 C) (Oral)   Ht 4' 7.5" (1.41 m) Comment: no shoes  Wt 147 lb 4 oz (66.8 kg)   SpO2 96%   BMI 33.61 kg/m   Body mass index is 33.61 kg/m.   Tobacco History  Smoking Status  . Never Smoker  Smokeless Tobacco  . Never Used     Counseling given: No   Past Medical History:  Diagnosis Date  . Alopecia    well controlled per previous PCP - improved with biotin  . Anxiety   . Anxiety    when around ppl  . Bipolar disorder (HCC)    no psych  . Chronic sinusitis   . Closed left humeral fracture 2010   closed left humeral neck fracture, also h/o L wrist fx  . Closed right humeral fracture 2011   coracoid nondisplaced, conservative therapy (Dr. Cleon Gustin)  . Depression   . Diverticulosis    by colonoscopy  . Falls    completed HHPT 10/2011, declined outpt PT  . GERD (gastroesophageal reflux disease)   . Glucose intolerance (impaired glucose tolerance)   . High cholesterol   . History of chicken pox   . 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 HLD (hyperlipidemia)   . Humerus fracture   . Hyperactivity of bladder   . Hypothyroid    previously hyperthyroid  . Hypothyroidism   . Osteopenia 2010, 2016   DEXA 2010: T -1.3 L spine, -2.1 femoral neck. DEXA 2016: T -2.4 hip, T -0.5 spine; rpt 2 yrs  . PAD (peripheral artery disease) (HCC)   . Phlebitis    left leg stays swollen  . Positive ANA (antinuclear antibody)   . Pulmonary  embolism [facility])    Past Surgical History:  Procedure Laterality Date  . ARTERIAL THROMBECTOMY  07/2009   with R mid popliteal artery angioplasty [balloon] (2011)[[[[  . CARDIAC CATHETERIZATION  08/2014   nl EF, small branch OM not amenable to PCI [name])  . CESAREAN SECTION    . CESAREAN SECTION  1973  . CHOLECYSTECTOMY    . COLONOSCOPY  08/2008   int/ext hem, severe diverticulosis with evidence of itis, rec rpt 10 yrs  . hospitalization  01/2007   depression, ECT  . INGUINAL HERNIA REPAIR  1980s   x2 on right  . LEFT HEART CATHETERIZATION WITH CORONARY ANGIOGRAM N/A 09/05/2014   Procedure: LEFT HEART CATHETERIZATION WITH CORONARY ANGIOGRAM;  Surgeon: Marykay Lex, MD;  Location: [facility] CATH LAB;  Service: Cardiovascular;  Laterality: N/A;   Family History  Problem Relation Age of Onset  . Cancer Father   . Heart attack Mother 51  . Coronary artery disease Mother 58  . Hyperlipidemia Mother   . Heart disease Mother   . Hypertension Mother   . Breast cancer Maternal Aunt 54  . Coronary artery disease Maternal Uncle   . Coronary artery disease Maternal Grandmother 70  . Stroke Neg Hx   .  Diabetes Neg Hx    History  Sexual Activity  . Sexual activity: No    Outpatient Encounter Prescriptions as of 04/17/2016  Medication Sig  . acetaminophen (TYLENOL) 500 MG tablet Take 500 mg by mouth every 8 (eight) hours as needed for moderate pain.  Marland Kitchen. atorvastatin (LIPITOR) 20 MG tablet Take 1 tablet (20 mg total) by mouth daily.  . Biotin 1000 MCG tablet Take 1,000 mcg by mouth 2 (two) times daily.    Marland Kitchen. buPROPion (WELLBUTRIN SR) 200 MG 12 hr tablet Take 1 tablet (200 mg total) by mouth 2 (two) times daily.  . Calcium Carb-Cholecalciferol (CALCIUM-VITAMIN D) 600-400 MG-UNIT TABS Take 1 tablet by mouth daily.  . Cholecalciferol (VITAMIN D) 2000 UNITS CAPS Take 1 capsule (2,000 Units total) by mouth daily.  . clonazePAM (KLONOPIN) 1 MG tablet Take 1 tablet (1 mg total) by mouth 2 (two) times  daily.  Tery Sanfilippo. Docusate Calcium (STOOL SOFTENER PO) Take 1 tablet by mouth daily as needed (constipation). Reported on 11/05/2015  . lamoTRIgine (LAMICTAL) 100 MG tablet Take 1 tablet (100 mg total) by mouth 2 (two) times daily.  Marland Kitchen. levothyroxine (SYNTHROID, LEVOTHROID) 50 MCG tablet TAKE 1 TABLET EVERY DAY  . PRESCRIPTION MEDICATION Place 2 drops into both eyes daily as needed (dryness).  . sertraline (ZOLOFT) 25 MG tablet Take 2 tablets (50 mg total) by mouth daily.  Marland Kitchen. warfarin (COUMADIN) 5 MG tablet TAKE DAILY AS DIRECTED BY COUMADIN CLINIC.  . [DISCONTINUED] clonazePAM (KLONOPIN) 1 MG tablet TAKE 1 TABLET BY MOUTH TWICE A DAY  . [DISCONTINUED] NAPROXEN PO    No facility-administered encounter medications on file as of 04/17/2016.     Activities of Daily Living In your present state of health, do you have any difficulty performing the following activities: 04/17/2016 10/27/2015  Hearing? N N  Vision? N N  Difficulty concentrating or making decisions? Y N  Walking or climbing stairs? Y N  Dressing or bathing? Y N  Doing errands, shopping? N N  Preparing Food and eating ? N -  Using the Toilet? N -  In the past six months, have you accidently leaked urine? N -  Do you have problems with loss of bowel control? N -  Managing your Medications? Y -  Managing your Finances? Y -  Housekeeping or managing your Housekeeping? Y -  Some recent data might be hidden    Patient Care Team: Eustaquio BoydenJavier Gutierrez, MD as PCP - General (Family Medicine) Eustaquio BoydenJavier Gutierrez, MD (Family Medicine)    Assessment:     Hearing Screening   125Hz  250Hz  500Hz  1000Hz  2000Hz  3000Hz  4000Hz  6000Hz  8000Hz   Right ear:   40 40 40  0    Left ear:   0 0 0  0      Visual Acuity Screening   Right eye Left eye Both eyes  Without correction: 20/25-1 20/40 20/25  With correction:       Exercise Activities and Dietary recommendations Current Exercise Habits: The patient does not participate in regular exercise at present,  Exercise limited by: None identified  Goals    . Increase water intake          Starting 04/17/2016, I will attempt to drink at least 8 oz of water with meals and when taking medication.       Fall Risk Fall Risk  04/17/2016 04/17/2016 04/12/2015 04/10/2014 03/11/2013  Falls in the past year? No No No No Yes  Number falls in past yr: - - - -  2 or more  Risk for fall due to : - - - - -   Depression Screen PHQ 2/9 Scores 04/17/2016 04/17/2016 04/12/2015 04/10/2014  PHQ - 2 Score 0 0 6 0  PHQ- 9 Score - - 13 -     Cognitive Testing MMSE - Mini Mental State Exam 04/17/2016  Orientation to time 5  Orientation to Place 5  Registration 3  Attention/ Calculation 0  Recall 3  Language- name 2 objects 0  Language- repeat 1  Language- follow 3 step command 3  Language- read & follow direction 0  Write a sentence 0  Copy design 0  Total score 20   PLEASE NOTE: A Mini-Cog screen was completed. Maximum score is 20. A value of 0 denotes this part of Folstein MMSE was not completed or the patient failed this part of the Mini-Cog screening.   Mini-Cog Screening Orientation to Time - Max 5 pts Orientation to Place - Max 5 pts Registration - Max 3 pts Recall - Max 3 pts Language Repeat - Max 1 pts Language Follow 3 Step Command - Max 3 pts  Immunization History  Administered Date(s) Administered  . Influenza Split 06/23/2011, 05/04/2012  . Influenza,inj,Quad PF,36+ Mos 03/11/2013, 04/10/2014, 04/12/2015, 03/07/2016  . Pneumococcal Conjugate-13 04/10/2014  . Pneumococcal Polysaccharide-23 02/18/2011  . Td 08/14/2011  . Zoster 08/15/2011   Screening Tests Health Maintenance  Topic Date Due  . DTaP/Tdap/Td (1 - Tdap) 08/13/2021 (Originally 08/15/2011)  . MAMMOGRAM  04/24/2016  . COLONOSCOPY  08/25/2018  . TETANUS/TDAP  08/13/2021  . INFLUENZA VACCINE  Completed  . DEXA SCAN  Completed  . ZOSTAVAX  Completed  . Hepatitis C Screening  Completed  . PNA vac Low Risk Adult  Completed        Plan:     I have personally reviewed and addressed the Medicare Annual Wellness questionnaire and have noted the following in the patient's chart:  A. Medical and social history B. Use of alcohol, tobacco or illicit drugs  C. Current medications and supplements D. Functional ability and status E.  Nutritional status F.  Physical activity G. Advance directives H. List of other physicians I.  Hospitalizations, surgeries, and ER visits in previous 12 months J.  Vitals K. Screenings to include hearing, vision, cognitive, depression L. Referrals and appointments - none  In addition, I have reviewed and discussed with patient certain preventive protocols, quality metrics, and best practice recommendations. A written personalized care plan for preventive services as well as general preventive health recommendations were provided to patient.  See attached scanned questionnaire for additional information.   Signed,   Randa Evens, MHA, BS, LPN Health Advisor

## 2016-04-17 NOTE — Patient Instructions (Signed)
Ms. Heywood IlesSeppey , Thank you for taking time to come for your Medicare Wellness Visit. I appreciate your ongoing commitment to your health goals. Please review the following plan we discussed and let me know if I can assist you in the future.   These are the goals we discussed: Goals    . Increase water intake          Starting 04/17/2016, I will attempt to drink at least 8 oz of water with meals and when taking medication.        This is a list of the screening recommended for you and due dates:  Health Maintenance  Topic Date Due  . DTaP/Tdap/Td vaccine (1 - Tdap) 08/13/2021*  . Mammogram  04/24/2016  . Colon Cancer Screening  08/25/2018  . Tetanus Vaccine  08/13/2021  . Flu Shot  Completed  . DEXA scan (bone density measurement)  Completed  . Shingles Vaccine  Completed  .  Hepatitis C: One time screening is recommended by Center for Disease Control  (CDC) for  adults born from [birth year] through 1965.   Completed  . Pneumonia vaccines  Completed  *Topic was postponed. The date shown is not the original due date.   Preventive Care for Adults  A healthy lifestyle and preventive care can promote health and wellness. Preventive health guidelines for adults include the following key practices.  . A routine yearly physical is a good way to check with your health care provider about your health and preventive screening. It is a chance to share any concerns and updates on your health and to receive a thorough exam.  . Visit your dentist for a routine exam and preventive care every 6 months. Brush your teeth twice a day and floss once a day. Good oral hygiene prevents tooth decay and gum disease.  . The frequency of eye exams is based on your age, health, family medical history, use  of contact lenses, and other factors. Follow your health care provider's ecommendations for frequency of eye exams.  . Eat a healthy diet. Foods like vegetables, fruits, whole grains, low-fat dairy products, and lean  protein foods contain the nutrients you need without too many calories. Decrease your intake of foods high in solid fats, added sugars, and salt. Eat the right amount of calories for you. Get information about a proper diet from your health care provider, if necessary.  . Regular physical exercise is one of the most important things you can do for your health. Most adults should get at least 150 minutes of moderate-intensity exercise (any activity that increases your heart rate and causes you to sweat) each week. In addition, most adults need muscle-strengthening exercises on 2 or more days a week.  Silver Sneakers may be a benefit available to you. To determine eligibility, you may visit the website: www.silversneakers.com or contact program at [phone] Mon-Fri between 8AM-8PM.   . Maintain a healthy weight. The body mass index (BMI) is a screening tool to identify possible weight problems. It provides an estimate of body fat based on height and weight. Your health care provider can find your BMI and can help you achieve or maintain a healthy weight.   For adults 20 years and older: ? A BMI below 18.5 is considered underweight. ? A BMI of 18.5 to 24.9 is normal. ? A BMI of 25 to 29.9 is considered overweight. ? A BMI of 30 and above is considered obese.   . Maintain normal blood lipids and cholesterol  levels by exercising and minimizing your intake of saturated fat. Eat a balanced diet with plenty of fruit and vegetables. Blood tests for lipids and cholesterol should begin at age 77 and be repeated every 5 years. If your lipid or cholesterol levels are high, you are over 50, or you are at high risk for heart disease, you may need your cholesterol levels checked more frequently. Ongoing high lipid and cholesterol levels should be treated with medicines if diet and exercise are not working.  . If you smoke, find out from your health care provider how to quit. If you do not use tobacco, please  do not start.  . If you choose to drink alcohol, please do not consume more than 2 drinks per day. One drink is considered to be 12 ounces (355 mL) of beer, 5 ounces (148 mL) of wine, or 1.5 ounces (44 mL) of liquor.  . If you are 49-81 years old, ask your health care provider if you should take aspirin to prevent strokes.  . Use sunscreen. Apply sunscreen liberally and repeatedly throughout the day. You should seek shade when your shadow is shorter than you. Protect yourself by wearing long sleeves, pants, a wide-brimmed hat, and sunglasses year round, whenever you are outdoors.  . Once a month, do a whole body skin exam, using a mirror to look at the skin on your back. Tell your health care provider of new moles, moles that have irregular borders, moles that are larger than a pencil eraser, or moles that have changed in shape or color.

## 2016-04-21 NOTE — Progress Notes (Signed)
I reviewed health advisor's note, was available for consultation, and agree with documentation and plan.  

## 2016-04-24 ENCOUNTER — Telehealth: Payer: Self-pay

## 2016-04-24 ENCOUNTER — Ambulatory Visit (INDEPENDENT_AMBULATORY_CARE_PROVIDER_SITE_OTHER): Payer: Commercial Managed Care - HMO

## 2016-04-24 ENCOUNTER — Ambulatory Visit (INDEPENDENT_AMBULATORY_CARE_PROVIDER_SITE_OTHER): Payer: Commercial Managed Care - HMO | Admitting: Family Medicine

## 2016-04-24 ENCOUNTER — Encounter: Payer: Self-pay | Admitting: Family Medicine

## 2016-04-24 VITALS — BP 122/74 | HR 64 | Temp 97.6°F | Wt 148.5 lb

## 2016-04-24 DIAGNOSIS — F3177 Bipolar disorder, in partial remission, most recent episode mixed: Secondary | ICD-10-CM

## 2016-04-24 DIAGNOSIS — Z7189 Other specified counseling: Secondary | ICD-10-CM

## 2016-04-24 DIAGNOSIS — S32010D Wedge compression fracture of first lumbar vertebra, subsequent encounter for fracture with routine healing: Secondary | ICD-10-CM

## 2016-04-24 DIAGNOSIS — M8580 Other specified disorders of bone density and structure, unspecified site: Secondary | ICD-10-CM

## 2016-04-24 DIAGNOSIS — M4310 Spondylolisthesis, site unspecified: Secondary | ICD-10-CM

## 2016-04-24 DIAGNOSIS — E785 Hyperlipidemia, unspecified: Secondary | ICD-10-CM

## 2016-04-24 DIAGNOSIS — E039 Hypothyroidism, unspecified: Secondary | ICD-10-CM

## 2016-04-24 DIAGNOSIS — Z0000 Encounter for general adult medical examination without abnormal findings: Secondary | ICD-10-CM

## 2016-04-24 DIAGNOSIS — G4700 Insomnia, unspecified: Secondary | ICD-10-CM

## 2016-04-24 DIAGNOSIS — M4319 Spondylolisthesis, multiple sites in spine: Secondary | ICD-10-CM

## 2016-04-24 DIAGNOSIS — R296 Repeated falls: Secondary | ICD-10-CM

## 2016-04-24 DIAGNOSIS — R2689 Other abnormalities of gait and mobility: Secondary | ICD-10-CM

## 2016-04-24 MED ORDER — VITAMIN D 50 MCG (2000 UT) PO CAPS: 2000.0000 [IU] | ORAL_CAPSULE | Freq: Every day | ORAL | 3 refills | Status: DC

## 2016-04-24 MED ORDER — BIOTIN 1000 MCG PO TABS: 1000.0000 ug | ORAL_TABLET | Freq: Two times a day (BID) | ORAL | 3 refills | Status: AC

## 2016-04-24 NOTE — Telephone Encounter (Signed)
Pt wanted to know what Dr Reece AgarG wanted pt to take for sleep and pain. Pt could not find her AVS; spoke with Selena BattenKim CMA and she just spoke with Mr Heywood IlesSeppey and wrote down for pain to take tylenol 1000 mg tid with meals and on AVS advised to take trial of melatonin 3 - 5 mg at hs. Pt voiced understanding.

## 2016-04-24 NOTE — Assessment & Plan Note (Signed)
Preventative protocols reviewed and updated unless pt declined. Discussed healthy diet and lifestyle.  

## 2016-04-24 NOTE — Assessment & Plan Note (Signed)
Appreciate psych care of patient.  

## 2016-04-24 NOTE — Progress Notes (Signed)
Pre visit review using our clinic review tool, if applicable. No additional management support is needed unless otherwise documented below in the visit note. 

## 2016-04-24 NOTE — Patient Instructions (Addendum)
Check at local durable medical equipment store for shower chair. We will print out Rx for OTC medicines.  I recommend you start using rolling walker with seat to prevent falls. Let me know when you're ready to start this.   Sleep hygiene checklist: 1. Avoid naps during the day 2. Avoid stimulants such as caffeine and nicotine. Avoid bedtime alcohol (it can speed onset of sleep but the body's metabolism can cause awakenings). 3. All forms of exercise help ensure sound sleep - limit vigorous exercise to morning or late afternoon 4. Avoid food too close to bedtime including chocolate (which contains caffeine) 5. Soak up natural light 6. Establish regular bedtime routine. 7. Associate bed with sleep - avoid TV, computer or phone, reading while in bed. 8. Ensure pleasant, relaxing sleep environment - quiet, dark, cool room.   Consider trial melatonin 3-5m at bedtime.   Health Maintenance, Female Adopting a healthy lifestyle and getting preventive care can go a long way to promote health and wellness. Talk with your health care provider about what schedule of regular examinations is right for you. This is a good chance for you to check in with your provider about disease prevention and staying healthy. In between checkups, there are plenty of things you can do on your own. Experts have done a lot of research about which lifestyle changes and preventive measures are most likely to keep you healthy. Ask your health care provider for more information. WEIGHT AND DIET  Eat a healthy diet  Be sure to include plenty of vegetables, fruits, low-fat dairy products, and lean protein.  Do not eat a lot of foods high in solid fats, added sugars, or salt.  Get regular exercise. This is one of the most important things you can do for your health.  Most adults should exercise for at least 150 minutes each week. The exercise should increase your heart rate and make you sweat (moderate-intensity  exercise).  Most adults should also do strengthening exercises at least twice a week. This is in addition to the moderate-intensity exercise.  Maintain a healthy weight  Body mass index (BMI) is a measurement that can be used to identify possible weight problems. It estimates body fat based on height and weight. Your health care provider can help determine your BMI and help you achieve or maintain a healthy weight.  For females 257years of age and older:   A BMI below 18.5 is considered underweight.  A BMI of 18.5 to 24.9 is normal.  A BMI of 25 to 29.9 is considered overweight.  A BMI of 30 and above is considered obese.  Watch levels of cholesterol and blood lipids  You should start having your blood tested for lipids and cholesterol at 70years of age, then have this test every 5 years.  You may need to have your cholesterol levels checked more often if:  Your lipid or cholesterol levels are high.  You are older than 70years of age.  You are at high risk for heart disease.  CANCER SCREENING   Lung Cancer  Lung cancer screening is recommended for adults 517859years old who are at high risk for lung cancer because of a history of smoking.  A yearly low-dose CT scan of the lungs is recommended for people who:  Currently smoke.  Have quit within the past 15 years.  Have at least a 30-pack-year history of smoking. A pack year is smoking an average of one pack of cigarettes a  day for 1 year.  Yearly screening should continue until it has been 15 years since you quit.  Yearly screening should stop if you develop a health problem that would prevent you from having lung cancer treatment.  Breast Cancer  Practice breast self-awareness. This means understanding how your breasts normally appear and feel.  It also means doing regular breast self-exams. Let your health care provider know about any changes, no matter how small.  If you are in your 20s or 30s, you should  have a clinical breast exam (CBE) by a health care provider every 1-3 years as part of a regular health exam.  If you are 80 or older, have a CBE every year. Also consider having a breast X-ray (mammogram) every year.  If you have a family history of breast cancer, talk to your health care provider about genetic screening.  If you are at high risk for breast cancer, talk to your health care provider about having an MRI and a mammogram every year.  Breast cancer gene (BRCA) assessment is recommended for women who have family members with BRCA-related cancers. BRCA-related cancers include:  Breast.  Ovarian.  Tubal.  Peritoneal cancers.  Results of the assessment will determine the need for genetic counseling and BRCA1 and BRCA2 testing. Cervical Cancer Your health care provider may recommend that you be screened regularly for cancer of the pelvic organs (ovaries, uterus, and vagina). This screening involves a pelvic examination, including checking for microscopic changes to the surface of your cervix (Pap test). You may be encouraged to have this screening done every 3 years, beginning at age 45.  For women ages 34-65, health care providers may recommend pelvic exams and Pap testing every 3 years, or they may recommend the Pap and pelvic exam, combined with testing for human papilloma virus (HPV), every 5 years. Some types of HPV increase your risk of cervical cancer. Testing for HPV may also be done on women of any age with unclear Pap test results.  Other health care providers may not recommend any screening for nonpregnant women who are considered low risk for pelvic cancer and who do not have symptoms. Ask your health care provider if a screening pelvic exam is right for you.  If you have had past treatment for cervical cancer or a condition that could lead to cancer, you need Pap tests and screening for cancer for at least 20 years after your treatment. If Pap tests have been  discontinued, your risk factors (such as having a new sexual partner) need to be reassessed to determine if screening should resume. Some women have medical problems that increase the chance of getting cervical cancer. In these cases, your health care provider may recommend more frequent screening and Pap tests. Colorectal Cancer  This type of cancer can be detected and often prevented.  Routine colorectal cancer screening usually begins at 70 years of age and continues through 70 years of age.  Your health care provider may recommend screening at an earlier age if you have risk factors for colon cancer.  Your health care provider may also recommend using home test kits to check for hidden blood in the stool.  A small camera at the end of a tube can be used to examine your colon directly (sigmoidoscopy or colonoscopy). This is done to check for the earliest forms of colorectal cancer.  Routine screening usually begins at age 81.  Direct examination of the colon should be repeated every 5-10 years through 70  years of age. However, you may need to be screened more often if early forms of precancerous polyps or small growths are found. Skin Cancer  Check your skin from head to toe regularly.  Tell your health care provider about any new moles or changes in moles, especially if there is a change in a mole's shape or color.  Also tell your health care provider if you have a mole that is larger than the size of a pencil eraser.  Always use sunscreen. Apply sunscreen liberally and repeatedly throughout the day.  Protect yourself by wearing long sleeves, pants, a wide-brimmed hat, and sunglasses whenever you are outside. HEART DISEASE, DIABETES, AND HIGH BLOOD PRESSURE   High blood pressure causes heart disease and increases the risk of stroke. High blood pressure is more likely to develop in:  People who have blood pressure in the high end of the normal range (130-139/85-89 mm Hg).  People  who are overweight or obese.  People who are African American.  If you are 45-6 years of age, have your blood pressure checked every 3-5 years. If you are 21 years of age or older, have your blood pressure checked every year. You should have your blood pressure measured twice--once when you are at a hospital or clinic, and once when you are not at a hospital or clinic. Record the average of the two measurements. To check your blood pressure when you are not at a hospital or clinic, you can use:  An automated blood pressure machine at a pharmacy.  A home blood pressure monitor.  If you are between 66 years and 61 years old, ask your health care provider if you should take aspirin to prevent strokes.  Have regular diabetes screenings. This involves taking a blood sample to check your fasting blood sugar level.  If you are at a normal weight and have a low risk for diabetes, have this test once every three years after 70 years of age.  If you are overweight and have a high risk for diabetes, consider being tested at a younger age or more often. PREVENTING INFECTION  Hepatitis B  If you have a higher risk for hepatitis B, you should be screened for this virus. You are considered at high risk for hepatitis B if:  You were born in a country where hepatitis B is common. Ask your health care provider which countries are considered high risk.  Your parents were born in a high-risk country, and you have not been immunized against hepatitis B (hepatitis B vaccine).  You have HIV or AIDS.  You use needles to inject street drugs.  You live with someone who has hepatitis B.  You have had sex with someone who has hepatitis B.  You get hemodialysis treatment.  You take certain medicines for conditions, including cancer, organ transplantation, and autoimmune conditions. Hepatitis C  Blood testing is recommended for:  Everyone born from [birth year] through 1965.  Anyone with known risk factors for  hepatitis C. Sexually transmitted infections (STIs)  You should be screened for sexually transmitted infections (STIs) including gonorrhea and chlamydia if:  You are sexually active and are younger than 70 years of age.  You are older than 70 years of age and your health care provider tells you that you are at risk for this type of infection.  Your sexual activity has changed since you were last screened and you are at an increased risk for chlamydia or gonorrhea. Ask your health care provider  if you are at risk.  If you do not have HIV, but are at risk, it may be recommended that you take a prescription medicine daily to prevent HIV infection. This is called pre-exposure prophylaxis (PrEP). You are considered at risk if:  You are sexually active and do not regularly use condoms or know the HIV status of your partner(s).  You take drugs by injection.  You are sexually active with a partner who has HIV. Talk with your health care provider about whether you are at high risk of being infected with HIV. If you choose to begin PrEP, you should first be tested for HIV. You should then be tested every 3 months for as long as you are taking PrEP.  PREGNANCY   If you are premenopausal and you may become pregnant, ask your health care provider about preconception counseling.  If you may become pregnant, take 400 to 800 micrograms (mcg) of folic acid every day.  If you want to prevent pregnancy, talk to your health care provider about birth control (contraception). OSTEOPOROSIS AND MENOPAUSE   Osteoporosis is a disease in which the bones lose minerals and strength with aging. This can result in serious bone fractures. Your risk for osteoporosis can be identified using a bone density scan.  If you are 110 years of age or older, or if you are at risk for osteoporosis and fractures, ask your health care provider if you should be screened.  Ask your health care provider whether you should take a  calcium or vitamin D supplement to lower your risk for osteoporosis.  Menopause may have certain physical symptoms and risks.  Hormone replacement therapy may reduce some of these symptoms and risks. Talk to your health care provider about whether hormone replacement therapy is right for you.  HOME CARE INSTRUCTIONS   Schedule regular health, dental, and eye exams.  Stay current with your immunizations.   Do not use any tobacco products including cigarettes, chewing tobacco, or electronic cigarettes.  If you are pregnant, do not drink alcohol.  If you are breastfeeding, limit how much and how often you drink alcohol.  Limit alcohol intake to no more than 1 drink per day for nonpregnant women. One drink equals 12 ounces of beer, 5 ounces of wine, or 1 ounces of hard liquor.  Do not use street drugs.  Do not share needles.  Ask your health care provider for help if you need support or information about quitting drugs.  Tell your health care provider if you often feel depressed.  Tell your health care provider if you have ever been abused or do not feel safe at home.   This information is not intended to replace advice given to you by your health care provider. Make sure you discuss any questions you have with your health care provider.   Document Released: 01/13/2011 Document Revised: 07/21/2014 Document Reviewed: 06/01/2013 [employer]vier Interactive Patient Education [employer].

## 2016-04-24 NOTE — Progress Notes (Signed)
BP 122/74   Pulse 64   Temp 97.6 F (36.4 C) (Oral)   Wt 148 lb 8 oz (67.4 kg)   BMI 33.90 kg/m    CC: CPE Subjective:    Patient ID: Kathryn ShownSharon J Edick, female    DOB: 1945-08-18, 70 y.o.   MRN: [id number]  HPI: Kathryn Mathews is a 70 y.o. female presenting on 04/24/2016 for Annual Exam   Saw Virl AxeLesia last week for medicare wellness visit, note reviewed. Failed hearing screen.   Endorsed 1 mo h/o insomnia at night with altered sleep cycle - bedtime 6am, wakes up at 2pm.  Requests Rx biotin, vit D, colace for FSA.  Concern over cost of bupropion HCL ($80 for 90d supply).  Requests shower chair with back to reduce risk of falls - has had near falls in shower. Increased fall risk due to impaired balance and gait. H/o TIA earlier this year  Preventative: COLONOSCOPY Date: 08/2008 int/ext hem, severe diverticulosis with evidence of itis, rec rpt 10 yrs Mammogram - WNL 04/2015. Requests Q2 yrs. Declines breast exam today, does not do breast exams at home.  Pap smear - Always normal in past. No problems with spotting, not sexually active. Decided to age out.  DEXA 2016: T -2.4 hip, T -0.5 spine; rpt 2 yrs. Compliant with calcium and vit D. Flu shot - yearly Tetanus - 07/2011  Pneumovax 02/2011, prevnar 03/2014 zostavax 08/2011  Advanced directive - states "if I can't think, just let me go". Thinks husband knows, would want husband then son to make decisions, meeting with Virl AxeLesia this afternoon to go over advanced directives.  Seat belt use discussed.  Sunscreen use discussed, no changing moles on skin.  Non smoker  Alcohol - none  Caffeine: 2 diet coke/day Lives with husband, 1 cat. 1 grown son - lives nearby. [place] Activity: no regular exercise Diet: good water, fruits/vegetables daily  Relevant past medical, surgical, family and social history reviewed and updated as indicated. Interim medical history since our last visit reviewed. Allergies and medications reviewed  and updated. Current Outpatient Prescriptions on File Prior to Visit  Medication Sig  . acetaminophen (TYLENOL) 500 MG tablet Take 500 mg by mouth every 8 (eight) hours as needed for moderate pain.  Marland Kitchen. atorvastatin (LIPITOR) 20 MG tablet Take 1 tablet (20 mg total) by mouth daily.  Marland Kitchen. buPROPion (WELLBUTRIN SR) 200 MG 12 hr tablet Take 1 tablet (200 mg total) by mouth 2 (two) times daily.  . Calcium Carb-Cholecalciferol (CALCIUM-VITAMIN D) 600-400 MG-UNIT TABS Take 1 tablet by mouth daily.  . clonazePAM (KLONOPIN) 1 MG tablet Take 1 tablet (1 mg total) by mouth 2 (two) times daily.  Tery Sanfilippo. Docusate Calcium (STOOL SOFTENER PO) Take 1 tablet by mouth daily as needed (constipation). Reported on 11/05/2015  . lamoTRIgine (LAMICTAL) 100 MG tablet Take 1 tablet (100 mg total) by mouth 2 (two) times daily.  Marland Kitchen. levothyroxine (SYNTHROID, LEVOTHROID) 50 MCG tablet TAKE 1 TABLET EVERY DAY  . PRESCRIPTION MEDICATION Place 2 drops into both eyes daily as needed (dryness).  . sertraline (ZOLOFT) 25 MG tablet Take 2 tablets (50 mg total) by mouth daily.  Marland Kitchen. warfarin (COUMADIN) 5 MG tablet TAKE DAILY AS DIRECTED BY COUMADIN CLINIC.   No current facility-administered medications on file prior to visit.     Review of Systems  Constitutional: Negative for activity change, appetite change, chills, fatigue, fever and unexpected weight change.  HENT: Negative for hearing loss.   Eyes: Negative for visual  disturbance.  Respiratory: Negative for cough, chest tightness, shortness of breath and wheezing.   Cardiovascular: Negative for chest pain, palpitations and leg swelling.  Gastrointestinal: Positive for nausea. Negative for abdominal distention, abdominal pain, blood in stool, constipation, diarrhea and vomiting.  Genitourinary: Negative for difficulty urinating and hematuria.  Musculoskeletal: Negative for arthralgias, myalgias and neck pain.  Skin: Negative for rash.  Neurological: Positive for weakness and headaches.  Negative for dizziness, seizures and syncope.       Unsteadiness in feet Refuses walker/cane  Hematological: Negative for adenopathy. Does not bruise/bleed easily.  Psychiatric/Behavioral: Positive for dysphoric mood. The patient is nervous/anxious.    Per HPI unless specifically indicated in ROS section     Objective:    BP 122/74   Pulse 64   Temp 97.6 F (36.4 C) (Oral)   Wt 148 lb 8 oz (67.4 kg)   BMI 33.90 kg/m   Wt Readings from Last 3 Encounters:  04/24/16 148 lb 8 oz (67.4 kg)  04/17/16 147 lb 4 oz (66.8 kg)  02/04/16 147 lb 9.6 oz (67 kg)    Physical Exam  Constitutional: She is oriented to person, place, and time. She appears well-developed and well-nourished. No distress.  HENT:  Head: Normocephalic and atraumatic.  Right Ear: Hearing, tympanic membrane, external ear and ear canal normal.  Left Ear: Hearing, tympanic membrane, external ear and ear canal normal.  Nose: Nose normal.  Mouth/Throat: Uvula is midline, oropharynx is clear and moist and mucous membranes are normal. No oropharyngeal exudate, posterior oropharyngeal edema or posterior oropharyngeal erythema.  Eyes: Conjunctivae and EOM are normal. Pupils are equal, round, and reactive to light. No scleral icterus.  Neck: Normal range of motion. Neck supple. Carotid bruit is not present. No thyromegaly present.  Cardiovascular: Normal rate, regular rhythm and intact distal pulses.   Murmur (2/6 systolic) heard. Pulses:      Radial pulses are 2+ on the right side, and 2+ on the left side.  Pulmonary/Chest: Effort normal and breath sounds normal. No respiratory distress. She has no wheezes. She has no rales.  Breast exam - declined  Abdominal: Soft. Bowel sounds are normal. She exhibits no distension and no mass. There is no tenderness. There is no rebound and no guarding.  Musculoskeletal: Normal range of motion. She exhibits no edema.  Lymphadenopathy:    She has no cervical adenopathy.  Neurological: She  is alert and oriented to person, place, and time.  CN grossly intact, station and gait intact  Skin: Skin is warm and dry. No rash noted.  Psychiatric: She has a normal mood and affect. Her behavior is normal. Judgment and thought content normal.  Nursing note and vitals reviewed.  Results for orders placed or performed in visit on 04/17/16  Comprehensive metabolic panel  Result Value Ref Range   Sodium 140 135 - 145 mEq/L   Potassium 4.1 3.5 - 5.1 mEq/L   Chloride 105 96 - 112 mEq/L   CO2 27 19 - 32 mEq/L   Glucose, Bld 90 70 - 99 mg/dL   BUN 23 6 - 23 mg/dL   Creatinine, Ser 4.09 0.40 - 1.20 mg/dL   Total Bilirubin 0.4 0.2 - 1.2 mg/dL   Alkaline Phosphatase 58 39 - 117 U/L   AST 20 0 - 37 U/L   ALT 10 0 - 35 U/L   Total Protein 7.3 6.0 - 8.3 g/dL   Albumin 4.1 3.5 - 5.2 g/dL   Calcium 9.3 8.4 - 81.1 mg/dL  GFR 56.23 (L) >60.00 mL/min  Lipid panel  Result Value Ref Range   Cholesterol 176 0 - 200 mg/dL   Triglycerides 191.4 (H) 0.0 - 149.0 mg/dL   HDL 78.29 >56.21 mg/dL   VLDL 30.8 0.0 - 65.7 mg/dL   LDL Cholesterol 92 0 - 99 mg/dL   Total CHOL/HDL Ratio 3    NonHDL 123.18   TSH  Result Value Ref Range   TSH 3.13 0.35 - 4.50 uIU/mL  CBC with Differential/Platelet  Result Value Ref Range   WBC 6.9 4.0 - 10.5 K/uL   RBC 4.62 3.87 - 5.11 Mil/uL   Hemoglobin 14.7 12.0 - 15.0 g/dL   HCT 84.6 96.2 - 95.2 %   MCV 94.5 78.0 - 100.0 fl   MCHC 33.6 30.0 - 36.0 g/dL   RDW 84.1 32.4 - 40.1 %   Platelets 218.0 150.0 - 400.0 K/uL   Neutrophils Relative % 55.3 43.0 - 77.0 %   Lymphocytes Relative 35.5 12.0 - 46.0 %   Monocytes Relative 5.6 3.0 - 12.0 %   Eosinophils Relative 3.4 0.0 - 5.0 %   Basophils Relative 0.2 0.0 - 3.0 %   Neutro Abs 3.8 1.4 - 7.7 K/uL   Lymphs Abs 2.4 0.7 - 4.0 K/uL   Monocytes Absolute 0.4 0.1 - 1.0 K/uL   Eosinophils Absolute 0.2 0.0 - 0.7 K/uL   Basophils Absolute 0.0 0.0 - 0.1 K/uL  VITAMIN D 25 Hydroxy (Vit-D Deficiency, Fractures)  Result  Value Ref Range   VITD 31.41 30.00 - 100.00 ng/mL      Assessment & Plan:   Problem List Items Addressed This Visit    Advanced care planning/counseling discussion    Advanced directive - states "if I can't think, just let me go". Thinks husband knows, would want husband then son to make decisions, meeting with Virl Axe this afternoon to go over advanced directives.       Bipolar disorder [facility])    Appreciate psych care of patient.      Compression fracture of L1 lumbar vertebra (HCC)    DEXA 2016: T -2.4 hip, T -0.5 spine; rpt 2 yrs. Reviewed calcium and vit D use      Health maintenance examination - Primary    Preventative protocols reviewed and updated unless pt declined. Discussed healthy diet and lifestyle.       HLD (hyperlipidemia)    Stable on 20mg  atorvastatin daily - continue.      Hypothyroid    Chronic, stable. Continue current regimen.      Imbalance    Encouraged regular use of seated shower chair and walker when outdoors.       Insomnia    Bedtime routine includes reading adventure novels at night time.  Provided with sleep handout and suggested trial melatonin for sleep.       Osteopenia    Reviewed cal/vit D dosing. Borderline osteoporotic, consider repeat DEXA in 2 years. Encouraged continued attempts at decreased fall risk.      Recurrent falls    Ongoing, recurrent. Recommended use rolling walker regularly to prevent falls. Rx for shower chair provided today along with directions to local durable medical equipment store.      Spondylolisthesis, grade 3    Suggested increase tylenol too 1000mg  PRN. Update with effect.        Other Visit Diagnoses   None.      Follow up plan: Return in about 4 months (around 08/25/2016), or as needed, for follow up  visit.  Ria Bush, MD

## 2016-04-24 NOTE — Assessment & Plan Note (Signed)
Advanced directive - states "if I can't think, just let me go". Thinks husband knows, would want husband then son to make decisions, meeting with Virl AxeLesia this afternoon to go over advanced directives.

## 2016-04-24 NOTE — Progress Notes (Signed)
Pt came in today for advance directive discussion and planning. 15 min discussion time.   Pt was provided [place] Advanced Directives paperwork. Pt was provided education on living will and HCPOA. Pt was provided opportunity to ask questions. Pt was encouraged to finish completion of documents and bring copy of them to PCP once notarized.  

## 2016-04-25 NOTE — Assessment & Plan Note (Addendum)
Chronic, stable. Continue current regimen. 

## 2016-04-25 NOTE — Assessment & Plan Note (Signed)
Suggested increase tylenol too 1000mg  PRN. Update with effect.

## 2016-04-25 NOTE — Assessment & Plan Note (Signed)
Bedtime routine includes reading adventure novels at night time.  Provided with sleep handout and suggested trial melatonin for sleep.

## 2016-04-25 NOTE — Assessment & Plan Note (Signed)
DEXA 2016: T -2.4 hip, T -0.5 spine; rpt 2 yrs. Reviewed calcium and vit D use

## 2016-04-25 NOTE — Assessment & Plan Note (Signed)
Stable on 20mg  atorvastatin daily - continue.

## 2016-04-25 NOTE — Assessment & Plan Note (Addendum)
Ongoing, recurrent. Recommended use rolling walker regularly to prevent falls. Rx for shower chair provided today along with directions to local durable medical equipment store.

## 2016-04-25 NOTE — Progress Notes (Signed)
I reviewed health advisor's note, was available for consultation, and agree with documentation and plan.  

## 2016-04-25 NOTE — Assessment & Plan Note (Signed)
Encouraged regular use of seated shower chair and walker when outdoors.

## 2016-04-25 NOTE — Assessment & Plan Note (Addendum)
Reviewed cal/vit D dosing. Borderline osteoporotic, consider repeat DEXA in 2 years. Encouraged continued attempts at decreased fall risk.

## 2016-05-06 ENCOUNTER — Ambulatory Visit (INDEPENDENT_AMBULATORY_CARE_PROVIDER_SITE_OTHER): Payer: Commercial Managed Care - HMO | Admitting: Psychiatry

## 2016-05-06 ENCOUNTER — Encounter: Payer: Self-pay | Admitting: Psychiatry

## 2016-05-06 VITALS — BP 132/84 | HR 76 | Temp 98.7°F | Wt 148.2 lb

## 2016-05-06 DIAGNOSIS — F411 Generalized anxiety disorder: Secondary | ICD-10-CM

## 2016-05-06 DIAGNOSIS — F333 Major depressive disorder, recurrent, severe with psychotic symptoms: Secondary | ICD-10-CM | POA: Diagnosis not present

## 2016-05-06 MED ORDER — ARIPIPRAZOLE 5 MG PO TABS: 5.0000 mg | ORAL_TABLET | Freq: Every day | ORAL | 1 refills | Status: DC

## 2016-05-06 MED ORDER — CLONAZEPAM 1 MG PO TABS: 1.0000 mg | ORAL_TABLET | Freq: Two times a day (BID) | ORAL | 1 refills | Status: DC

## 2016-05-06 NOTE — Progress Notes (Signed)
Follow-up for this 70 year old woman with depression. Patient says she is feeling even worse than usual. Doesn't like to leave the house very much. Continues to have problems getting along with her family. Mood feels sad and down. Denies any suicidal thoughts. Says that she has been compliant with medication.  Neatly dressed and groomed. Sad and flat. Mood depressed. Thoughts lucid. No sign of delusions. Denies suicidal or homicidal ideation. Alert and oriented 4. Cognitively appears to be intact.  Patient with chronic depression and anxiety has failed multiple medication trials. Addition of Zoloft appears to of been unsuccessful. I instructed taper down on the Zoloft by decreasing the dose in half for one week and then stopping it. I would like her to start Abilify instead as a potential additional treatment for depression. Add 5 mg of Abilify a day. Continue other medicine. Supportive counseling and encouraged her to consider seeing a therapist. Follow-up 3 months.

## 2016-05-07 ENCOUNTER — Other Ambulatory Visit: Payer: Commercial Managed Care - HMO

## 2016-05-07 ENCOUNTER — Other Ambulatory Visit (INDEPENDENT_AMBULATORY_CARE_PROVIDER_SITE_OTHER): Payer: Commercial Managed Care - HMO

## 2016-05-07 DIAGNOSIS — Z86718 Personal history of other venous thrombosis and embolism: Secondary | ICD-10-CM | POA: Diagnosis not present

## 2016-05-07 LAB — POCT INR: INR: 2.9

## 2016-05-09 ENCOUNTER — Other Ambulatory Visit: Payer: Self-pay | Admitting: *Deleted

## 2016-05-09 MED ORDER — WARFARIN SODIUM 5 MG PO TABS: ORAL_TABLET | ORAL | 1 refills | Status: DC

## 2016-05-09 NOTE — Telephone Encounter (Signed)
Sent in

## 2016-05-09 NOTE — Telephone Encounter (Signed)
Pt left message at triage requesting refill of med, she said she only has a week's supply left, last filled on 02/11/16 #120 with 0 refills  [facility]

## 2016-05-12 ENCOUNTER — Other Ambulatory Visit: Payer: Self-pay | Admitting: Psychiatry

## 2016-05-12 ENCOUNTER — Telehealth: Payer: Self-pay | Admitting: Psychiatry

## 2016-05-14 DIAGNOSIS — I708 Atherosclerosis of other arteries: Secondary | ICD-10-CM

## 2016-05-14 DIAGNOSIS — I700 Atherosclerosis of aorta: Secondary | ICD-10-CM

## 2016-05-14 HISTORY — DX: Atherosclerosis of aorta: I70.0

## 2016-05-14 HISTORY — DX: Atherosclerosis of other arteries: I70.8

## 2016-05-29 ENCOUNTER — Other Ambulatory Visit (INDEPENDENT_AMBULATORY_CARE_PROVIDER_SITE_OTHER): Payer: Commercial Managed Care - HMO

## 2016-05-29 ENCOUNTER — Other Ambulatory Visit: Payer: Self-pay | Admitting: Family Medicine

## 2016-05-29 DIAGNOSIS — Z86718 Personal history of other venous thrombosis and embolism: Secondary | ICD-10-CM | POA: Diagnosis not present

## 2016-05-29 LAB — POCT INR: INR: 5.2

## 2016-05-29 MED ORDER — PERMETHRIN 5 % EX CREA: TOPICAL_CREAM | CUTANEOUS | 0 refills | Status: DC

## 2016-05-29 NOTE — Progress Notes (Signed)
Husband with likely scabies, would treat concurrently.  D/w pt.

## 2016-06-03 ENCOUNTER — Telehealth: Payer: Self-pay

## 2016-06-03 ENCOUNTER — Ambulatory Visit: Payer: Commercial Managed Care - HMO

## 2016-06-03 NOTE — Telephone Encounter (Signed)
Pt wants to get lt shoulder and back xray; pt fell last week after getting tangled up in her pant leg in the bathroom and pt has bruise on arm and did not start hurting until today.pt had annual exam on 04/24/16. Pt has coumadin clinic appt 06/03/16 at 3 pm and wants to get xray while at office. I advised she would need to be seen before xray order. Pt said OK but she wanted to be seen while she was here this afternoon. No available appts. Pt request Dr Reece AgarG to order xray. Please advise.

## 2016-06-03 NOTE — Telephone Encounter (Signed)
TC to pt to schedule. Coming tomorrow 06/04/16 at 3:00pm

## 2016-06-03 NOTE — Telephone Encounter (Signed)
Recommend be evaluated prior to imaging. plz schedule with next available provider.

## 2016-06-04 ENCOUNTER — Ambulatory Visit
Admission: RE | Admit: 2016-06-04 | Discharge: 2016-06-04 | Disposition: A | Discharge status: Home / Self Care | Payer: Commercial Managed Care - HMO | Source: Ambulatory Visit | Attending: Family Medicine | Admitting: Family Medicine

## 2016-06-04 ENCOUNTER — Ambulatory Visit: Payer: Self-pay

## 2016-06-04 ENCOUNTER — Ambulatory Visit (INDEPENDENT_AMBULATORY_CARE_PROVIDER_SITE_OTHER): Payer: Commercial Managed Care - HMO | Admitting: Family Medicine

## 2016-06-04 ENCOUNTER — Encounter: Payer: Self-pay | Admitting: Family Medicine

## 2016-06-04 VITALS — BP 122/80 | HR 75 | Temp 97.5°F | Wt 148.5 lb

## 2016-06-04 DIAGNOSIS — M79602 Pain in left arm: Secondary | ICD-10-CM

## 2016-06-04 DIAGNOSIS — Z5181 Encounter for therapeutic drug level monitoring: Secondary | ICD-10-CM

## 2016-06-04 DIAGNOSIS — M4319 Spondylolisthesis, multiple sites in spine: Secondary | ICD-10-CM

## 2016-06-04 DIAGNOSIS — S32010D Wedge compression fracture of first lumbar vertebra, subsequent encounter for fracture with routine healing: Secondary | ICD-10-CM

## 2016-06-04 DIAGNOSIS — I739 Peripheral vascular disease, unspecified: Secondary | ICD-10-CM

## 2016-06-04 DIAGNOSIS — M4310 Spondylolisthesis, site unspecified: Secondary | ICD-10-CM

## 2016-06-04 DIAGNOSIS — R296 Repeated falls: Secondary | ICD-10-CM | POA: Diagnosis not present

## 2016-06-04 DIAGNOSIS — R2689 Other abnormalities of gait and mobility: Secondary | ICD-10-CM | POA: Diagnosis not present

## 2016-06-04 LAB — POCT INR: INR: 1.6

## 2016-06-04 NOTE — Assessment & Plan Note (Signed)
Thought lamictal related - did better on lower dose, now back on 100mg  BID. Has shower chair. Refuses to use rolling walker.

## 2016-06-04 NOTE — Assessment & Plan Note (Addendum)
Recent fall where she hit chin as well as L arm with resultant large forearm bruise. Slowly improving.  Ongoing risk due to unsteadiness possibly from psychotropics. Encouraged walker use - pt continues to refuse this. Discussed risks of ongoing falls with coumadin use. Consider PT referral for balance training if not done yet.

## 2016-06-04 NOTE — Patient Instructions (Signed)
Pre visit review using our clinic review tool, if applicable. No additional management support is needed unless otherwise documented below in the visit note. 

## 2016-06-04 NOTE — Patient Instructions (Addendum)
Xray of left arm today. Lumbar support brace prescription provided today - get it filled at local medial equipment store.  Give brace and walker a try.  Continue tylenol.

## 2016-06-04 NOTE — Assessment & Plan Note (Signed)
Continue tylenol 1000mg  TID PRN. Lumbar support brace Rx provided today.

## 2016-06-04 NOTE — Assessment & Plan Note (Signed)
Prior INR was >5, on coumadin 5mg  daily.  Coumadin was then held x2 days and decreased to 2.5mg  daily. Today subtherapeutic - will restart 5mg  daily, with 2.5mg  MWF.  Recheck on Monday.

## 2016-06-04 NOTE — Progress Notes (Signed)
Pre visit review using our clinic review tool, if applicable. No additional management support is needed unless otherwise documented below in the visit note. 

## 2016-06-04 NOTE — Assessment & Plan Note (Signed)
In ongoing lumbar pain - update lumbar films.

## 2016-06-04 NOTE — Progress Notes (Signed)
BP 122/80 (BP Location: Right Arm, Patient Position: Sitting)   Pulse 75   Temp 97.5 F (36.4 C) (Oral)   Wt 148 lb 8 oz (67.4 kg)   SpO2 97%   BMI 33.90 kg/m    CC: fall with arm pain Subjective:    Patient ID: Kathryn Mathews, female    DOB: 07/30/1945, 70 y.o.   MRN: [id number]  HPI: Kathryn Mathews is a 70 y.o. female presenting on 06/04/2016 for Fall and Left Shoulder/arm pain   Patient fell again last week while putting on pants. Hit chin on side of tub as well as left arm with resultant large bruise and hematoma that is slowly improving. Got shower chair yesterday - hit both shins and bled but feels this helped.. She does regularly use rollator. Refused ER or office evaluation.   Ongoing fall risk due to impaired balance and gait. Last visit we provided Rx for shower chair with back - has had near falls in shower. H/o TIA earlier this year  Ongoing lower back pain, h/o L1 compression fracture 2014. Interested in back brace. Rx provided today.   Relevant past medical, surgical, family and social history reviewed and updated as indicated. Interim medical history since our last visit reviewed. Allergies and medications reviewed and updated. Current Outpatient Prescriptions on File Prior to Visit  Medication Sig  . acetaminophen (TYLENOL) 500 MG tablet Take 500 mg by mouth every 8 (eight) hours as needed for moderate pain.  . ARIPiprazole (ABILIFY) 5 MG tablet Take 1 tablet (5 mg total) by mouth at bedtime.  Marland Kitchen atorvastatin (LIPITOR) 20 MG tablet Take 1 tablet (20 mg total) by mouth daily.  . Biotin 1000 MCG tablet Take 1 tablet (1 mg total) by mouth 2 (two) times daily.  Marland Kitchen buPROPion (WELLBUTRIN SR) 200 MG 12 hr tablet Take 1 tablet (200 mg total) by mouth 2 (two) times daily.  . Calcium Carb-Cholecalciferol (CALCIUM-VITAMIN D) 600-400 MG-UNIT TABS Take 1 tablet by mouth daily.  . Cholecalciferol (VITAMIN D) 2000 units CAPS Take 1 capsule (2,000 Units total) by mouth daily.  .  clonazePAM (KLONOPIN) 1 MG tablet Take 1 tablet (1 mg total) by mouth 2 (two) times daily.  Tery Sanfilippo Calcium (STOOL SOFTENER PO) Take 1 tablet by mouth daily as needed (constipation). Reported on 11/05/2015  . lamoTRIgine (LAMICTAL) 100 MG tablet Take 1 tablet (100 mg total) by mouth 2 (two) times daily.  Marland Kitchen levothyroxine (SYNTHROID, LEVOTHROID) 50 MCG tablet TAKE 1 TABLET EVERY DAY  . PRESCRIPTION MEDICATION Place 2 drops into both eyes daily as needed (dryness).  . warfarin (COUMADIN) 5 MG tablet TAKE DAILY AS DIRECTED BY COUMADIN CLINIC.   No current facility-administered medications on file prior to visit.     Review of Systems Per HPI unless specifically indicated in ROS section     Objective:    BP 122/80 (BP Location: Right Arm, Patient Position: Sitting)   Pulse 75   Temp 97.5 F (36.4 C) (Oral)   Wt 148 lb 8 oz (67.4 kg)   SpO2 97%   BMI 33.90 kg/m   Wt Readings from Last 3 Encounters:  06/04/16 148 lb 8 oz (67.4 kg)  04/24/16 148 lb 8 oz (67.4 kg)  04/17/16 147 lb 4 oz (66.8 kg)    Physical Exam  Constitutional: She appears well-developed and well-nourished. No distress.  HENT:  Head: Normocephalic and atraumatic.  Mouth/Throat: Oropharynx is clear and moist. No oropharyngeal exudate.  Cardiovascular: Normal rate, regular  rhythm, normal heart sounds and intact distal pulses.   No murmur heard. Pulmonary/Chest: Effort normal and breath sounds normal. No respiratory distress. She has no wheezes. She has no rales.  Musculoskeletal: She exhibits no edema.  Tender to palpation diffusely BUE however she does have more point tenderness at L humerus  Skin: Skin is warm and dry. Bruising and ecchymosis noted. No rash noted.  Large bruise/ecchymosis throughout L forearm  Psychiatric: She has a normal mood and affect.  Bright affect today  Nursing note and vitals reviewed.  Results for orders placed or performed in visit on 05/29/16  POCT INR  Result Value Ref Range    INR 5.2       Assessment & Plan:   Problem List Items Addressed This Visit    Compression fracture of L1 lumbar vertebra (HCC)    In ongoing lumbar pain - update lumbar films.       Encounter for therapeutic drug monitoring    Prior INR was >5, on coumadin 5mg  daily.  Coumadin was then held x2 days and decreased to 2.5mg  daily. Today subtherapeutic - will restart 5mg  daily, with 2.5mg  MWF.  Recheck on Monday.      Imbalance    Thought lamictal related - did better on lower dose, now back on 100mg  BID. Has shower chair. Refuses to use rolling walker.      Left arm pain - Primary    Given point tenderness, check xray to r/o humeral fracture (in h/o same). Shoulder exam with good ROM - not consistent with shoulder injury. Pt agrees.  ADDENDUM ==> pt left without xray today due to "I could not wait for it" although she did not have to wait long.       Relevant Orders   DG Humerus Left   Recurrent falls    Recent fall where she hit chin as well as L arm with resultant large forearm bruise. Slowly improving.  Ongoing risk due to unsteadiness possibly from psychotropics. Encouraged walker use - pt continues to refuse this. Discussed risks of ongoing falls with coumadin use. Consider PT referral for balance training if not done yet.       Spondylolisthesis, grade 3    Continue tylenol 1000mg  TID PRN. Lumbar support brace Rx provided today.       Relevant Orders   DG Lumbar Spine Complete       Follow up plan: Return if symptoms worsen or fail to improve.  Eustaquio Boyden, MD

## 2016-06-04 NOTE — Assessment & Plan Note (Addendum)
Given point tenderness, check xray to r/o humeral fracture (in h/o same). Shoulder exam with good ROM - not consistent with shoulder injury. Pt agrees.  ADDENDUM ==> pt left without xray today due to "I could not wait for it" although she did not have to wait long.

## 2016-06-08 ENCOUNTER — Encounter (HOSPITAL_COMMUNITY): Payer: Self-pay | Admitting: Emergency Medicine

## 2016-06-08 ENCOUNTER — Emergency Department (HOSPITAL_COMMUNITY)
Admission: EM | Admit: 2016-06-08 | Discharge: 2016-06-09 | Disposition: A | Discharge status: Home / Self Care | Payer: Commercial Managed Care - HMO | Attending: Emergency Medicine | Admitting: Emergency Medicine

## 2016-06-08 DIAGNOSIS — Z8673 Personal history of transient ischemic attack (TIA), and cerebral infarction without residual deficits: Secondary | ICD-10-CM | POA: Diagnosis not present

## 2016-06-08 DIAGNOSIS — M79622 Pain in left upper arm: Secondary | ICD-10-CM | POA: Insufficient documentation

## 2016-06-08 DIAGNOSIS — Z79899 Other long term (current) drug therapy: Secondary | ICD-10-CM | POA: Diagnosis not present

## 2016-06-08 DIAGNOSIS — Y9289 Other specified places as the place of occurrence of the external cause: Secondary | ICD-10-CM | POA: Insufficient documentation

## 2016-06-08 DIAGNOSIS — S7001XA Contusion of right hip, initial encounter: Secondary | ICD-10-CM | POA: Diagnosis not present

## 2016-06-08 DIAGNOSIS — Y999 Unspecified external cause status: Secondary | ICD-10-CM | POA: Diagnosis not present

## 2016-06-08 DIAGNOSIS — I10 Essential (primary) hypertension: Secondary | ICD-10-CM | POA: Insufficient documentation

## 2016-06-08 DIAGNOSIS — R102 Pelvic and perineal pain: Secondary | ICD-10-CM | POA: Insufficient documentation

## 2016-06-08 DIAGNOSIS — E039 Hypothyroidism, unspecified: Secondary | ICD-10-CM | POA: Diagnosis not present

## 2016-06-08 DIAGNOSIS — Y9389 Activity, other specified: Secondary | ICD-10-CM | POA: Diagnosis not present

## 2016-06-08 DIAGNOSIS — M79651 Pain in right thigh: Secondary | ICD-10-CM | POA: Diagnosis present

## 2016-06-08 DIAGNOSIS — W182XXA Fall in (into) shower or empty bathtub, initial encounter: Secondary | ICD-10-CM | POA: Insufficient documentation

## 2016-06-08 DIAGNOSIS — Z7901 Long term (current) use of anticoagulants: Secondary | ICD-10-CM | POA: Diagnosis not present

## 2016-06-08 DIAGNOSIS — M79606 Pain in leg, unspecified: Secondary | ICD-10-CM | POA: Diagnosis not present

## 2016-06-08 DIAGNOSIS — T148XXA Other injury of unspecified body region, initial encounter: Secondary | ICD-10-CM | POA: Diagnosis not present

## 2016-06-08 NOTE — ED Triage Notes (Addendum)
Pt to ED via [facility]> pt fell in bathtub on Tuesday and initially didn't have pain.  C/o bruising to R thigh, L forearm, and chin.  Seen by PCP after fall but states now having R thigh pain.  Pt takes coumadin.

## 2016-06-09 ENCOUNTER — Ambulatory Visit (INDEPENDENT_AMBULATORY_CARE_PROVIDER_SITE_OTHER): Payer: Commercial Managed Care - HMO

## 2016-06-09 ENCOUNTER — Other Ambulatory Visit: Payer: Self-pay | Admitting: Family Medicine

## 2016-06-09 ENCOUNTER — Ambulatory Visit
Admission: RE | Admit: 2016-06-09 | Discharge: 2016-06-09 | Disposition: A | Discharge status: Home / Self Care | Payer: Commercial Managed Care - HMO | Source: Ambulatory Visit | Attending: Family Medicine | Admitting: Family Medicine

## 2016-06-09 ENCOUNTER — Ambulatory Visit (HOSPITAL_COMMUNITY): Payer: Commercial Managed Care - HMO

## 2016-06-09 ENCOUNTER — Ambulatory Visit (INDEPENDENT_AMBULATORY_CARE_PROVIDER_SITE_OTHER)
Admission: RE | Admit: 2016-06-09 | Discharge: 2016-06-09 | Disposition: A | Discharge status: Home / Self Care | Payer: Commercial Managed Care - HMO | Source: Ambulatory Visit | Attending: Family Medicine | Admitting: Family Medicine

## 2016-06-09 ENCOUNTER — Emergency Department (HOSPITAL_COMMUNITY): Payer: Commercial Managed Care - HMO

## 2016-06-09 DIAGNOSIS — Z5181 Encounter for therapeutic drug level monitoring: Secondary | ICD-10-CM | POA: Diagnosis not present

## 2016-06-09 DIAGNOSIS — M4310 Spondylolisthesis, site unspecified: Secondary | ICD-10-CM

## 2016-06-09 DIAGNOSIS — M79602 Pain in left arm: Secondary | ICD-10-CM

## 2016-06-09 DIAGNOSIS — S7001XA Contusion of right hip, initial encounter: Secondary | ICD-10-CM | POA: Diagnosis not present

## 2016-06-09 DIAGNOSIS — I739 Peripheral vascular disease, unspecified: Secondary | ICD-10-CM

## 2016-06-09 DIAGNOSIS — S32020A Wedge compression fracture of second lumbar vertebra, initial encounter for closed fracture: Secondary | ICD-10-CM | POA: Diagnosis not present

## 2016-06-09 DIAGNOSIS — M4319 Spondylolisthesis, multiple sites in spine: Secondary | ICD-10-CM

## 2016-06-09 DIAGNOSIS — S4992XA Unspecified injury of left shoulder and upper arm, initial encounter: Secondary | ICD-10-CM | POA: Diagnosis not present

## 2016-06-09 LAB — PROTIME-INR
INR: 3.12
INR: 3.5 — AB (ref <=1.1)
INR: 3.5 — AB (ref <=1.1)
PROTHROMBIN TIME: 32.8 s — AB (ref 11.4–15.2)

## 2016-06-09 LAB — POCT INR: INR: 3.5

## 2016-06-09 MED ORDER — TRAMADOL HCL 50 MG PO TABS: 50.0000 mg | ORAL_TABLET | Freq: Four times a day (QID) | ORAL | 0 refills | Status: DC | PRN

## 2016-06-09 MED ORDER — TRAMADOL HCL 50 MG PO TABS
50.0000 mg | ORAL_TABLET | Freq: Once | ORAL | Status: AC
  Administered 2016-06-09: 50 mg via ORAL
  Filled 2016-06-09: qty 1

## 2016-06-09 NOTE — ED Notes (Signed)
Patient refused to ambulate stating that she is "hurting too much to try to walk." Patient has received Tramadol as prescribed.

## 2016-06-09 NOTE — ED Notes (Signed)
Patient transported to X-ray 

## 2016-06-09 NOTE — ED Notes (Signed)
Phlebotomy at bedside at this time.

## 2016-06-09 NOTE — Discharge Instructions (Signed)
FOLLOW UP WITH DR. Sharen HonesGUTIERREZ AS PLANNED. TAKE TRAMADOL FOR PAIN AS DIRECTED. RETURN HERE WITH ANY WORSENING SYMPTOMS OR NEW CONCERNS.

## 2016-06-09 NOTE — ED Notes (Signed)
[facility]

## 2016-06-09 NOTE — Patient Instructions (Signed)
Pre visit review using our clinic review tool, if applicable. No additional management support is needed unless otherwise documented below in the visit note. 

## 2016-06-09 NOTE — ED Notes (Signed)
Pt unable to ambulate at this time. Complains of severe pain in her leg.

## 2016-06-13 ENCOUNTER — Ambulatory Visit (INDEPENDENT_AMBULATORY_CARE_PROVIDER_SITE_OTHER): Payer: Commercial Managed Care - HMO | Admitting: Family Medicine

## 2016-06-13 ENCOUNTER — Encounter: Payer: Self-pay | Admitting: Family Medicine

## 2016-06-13 VITALS — BP 122/70 | HR 73 | Temp 98.2°F

## 2016-06-13 DIAGNOSIS — M79604 Pain in right leg: Secondary | ICD-10-CM | POA: Diagnosis not present

## 2016-06-13 DIAGNOSIS — M4319 Spondylolisthesis, multiple sites in spine: Secondary | ICD-10-CM | POA: Diagnosis not present

## 2016-06-13 DIAGNOSIS — R2689 Other abnormalities of gait and mobility: Secondary | ICD-10-CM

## 2016-06-13 DIAGNOSIS — M4310 Spondylolisthesis, site unspecified: Secondary | ICD-10-CM

## 2016-06-13 DIAGNOSIS — M25551 Pain in right hip: Secondary | ICD-10-CM | POA: Insufficient documentation

## 2016-06-13 MED ORDER — DICLOFENAC SODIUM 1 % TD GEL: 1.0000 "application " | Freq: Two times a day (BID) | TRANSDERMAL | 1 refills | Status: AC | PRN

## 2016-06-13 MED ORDER — TRAMADOL HCL 50 MG PO TABS: 50.0000 mg | ORAL_TABLET | Freq: Two times a day (BID) | ORAL | 0 refills | Status: DC | PRN

## 2016-06-13 NOTE — Assessment & Plan Note (Addendum)
Strong distal pulses. Doubt DVT as anticoagulated.  Point tender medial knee and medial distal thigh - ?bursitis. Treat with voltaren gel, continue tylenol. Pt and husband agree with plan.  She was prescribed tramadol at ER - discussed increased fall risk on medication, will refill #15 for limited Rx

## 2016-06-13 NOTE — Patient Instructions (Signed)
I wonder about bursitis of the knee Try voltaren gel to the inside of the knee.  May continue tramadol for breakthrough pain. Caution as tramadol can interact with coumadin some, and can make you more unsteady.  Continue ice/heating pad.  Should improve with time, let us know if not the case.  I recommend walker use.

## 2016-06-13 NOTE — Assessment & Plan Note (Signed)
Discussed tylenol, heating pad, ice, topical rub.

## 2016-06-13 NOTE — Assessment & Plan Note (Addendum)
Encouraged decrease lamictal dosing - pt to check with psych. Encouraged walker use.

## 2016-06-13 NOTE — Progress Notes (Signed)
BP 122/70   Pulse 73   Temp 98.2 F (36.8 C) (Oral)   SpO2 97%    CC: ER f/u visit Subjective:    Patient ID: Kathryn Mathews, female    DOB: [birth year], 70 y.o.   MRN: [id number]  HPI: Kathryn Mathews is a 70 y.o. female presenting on 06/13/2016 for Hospitalization Follow-up   Suffered fall 06/02/2016 in tub, hit R thigh, L forearm and chin. Progressively worse R leg pain so evaluated at ER - pelvic xray unrevealing. Lumbar spine, L humeral xray also unrevealing. Prescribed tramadol #15 on Monday by ER.  H/o recurrent falls.   Leg is doing better but again feels pain with ambulation so she is in wheelchair today.   Relevant past medical, surgical, family and social history reviewed and updated as indicated. Interim medical history since our last visit reviewed. Allergies and medications reviewed and updated. Current Outpatient Prescriptions on File Prior to Visit  Medication Sig  . acetaminophen (TYLENOL) 500 MG tablet Take 500 mg by mouth every 8 (eight) hours as needed for moderate pain.  . ARIPiprazole (ABILIFY) 5 MG tablet Take 1 tablet (5 mg total) by mouth at bedtime.  Marland Kitchen atorvastatin (LIPITOR) 20 MG tablet Take 1 tablet (20 mg total) by mouth daily.  . Biotin 1000 MCG tablet Take 1 tablet (1 mg total) by mouth 2 (two) times daily.  Marland Kitchen buPROPion (WELLBUTRIN SR) 200 MG 12 hr tablet Take 1 tablet (200 mg total) by mouth 2 (two) times daily.  . Calcium Carb-Cholecalciferol (CALCIUM-VITAMIN D) 600-400 MG-UNIT TABS Take 1 tablet by mouth daily.  . Cholecalciferol (VITAMIN D) 2000 units CAPS Take 1 capsule (2,000 Units total) by mouth daily.  . clonazePAM (KLONOPIN) 1 MG tablet Take 1 tablet (1 mg total) by mouth 2 (two) times daily.  Tery Sanfilippo Calcium (STOOL SOFTENER PO) Take 1 tablet by mouth daily as needed (constipation). Reported on 11/05/2015  . lamoTRIgine (LAMICTAL) 100 MG tablet Take 1 tablet (100 mg total) by mouth 2 (two) times daily.  Marland Kitchen levothyroxine (SYNTHROID,  LEVOTHROID) 50 MCG tablet TAKE 1 TABLET EVERY DAY  . PRESCRIPTION MEDICATION Place 2 drops into both eyes daily as needed (dryness).  . warfarin (COUMADIN) 5 MG tablet TAKE DAILY AS DIRECTED BY COUMADIN CLINIC.   No current facility-administered medications on file prior to visit.     Review of Systems Per HPI unless specifically indicated in ROS section     Objective:    BP 122/70   Pulse 73   Temp 98.2 F (36.8 C) (Oral)   SpO2 97%   Wt Readings from Last 3 Encounters:  06/04/16 148 lb 8 oz (67.4 kg)  04/24/16 148 lb 8 oz (67.4 kg)  04/17/16 147 lb 4 oz (66.8 kg)    Physical Exam  Constitutional: She appears well-developed and well-nourished. No distress.  Musculoskeletal: She exhibits no edema.  No palpable cords 2+ DP bilaterally o pain with int/ext rotation at hip. Tender to palpation medial knee and medial distal thigh but no direct bony tenderness to palpation   Skin: Skin is warm and dry. Bruising (R lower leg) noted. No rash noted.  Nursing note and vitals reviewed.  Results for orders placed or performed in visit on 06/09/16  Protime-INR  Result Value Ref Range   INR 3.5 (A) 0.9 - 1.1  Protime-INR  Result Value Ref Range   INR 3.5 (A) 0.9 - 1.1  POCT INR  Result Value Ref Range   INR 3.5  LUMBAR SPINE - COMPLETE 4+ VIEW COMPARISON:  06/09/2016. FINDINGS: Degenerative changes lumbar spine and both hips. L1 compression fracture. This is stable from prior exam. Stable mild L1 retrolisthesis on L2. Aortoiliac atherosclerotic vascular disease IMPRESSION: 1. Diffuse multilevel degenerative change. Stable mild retrolisthesis of L1 on L2. Stable 1.5 cm anterolisthesis L5 on S1. 2. Stable L1 compression fracture. 3. Aortoiliac atherosclerotic vascular disease. Electronically Signed   By: Maisie Fus  Register   On: 06/09/2016 16:33    Assessment & Plan:   Problem List Items Addressed This Visit    Imbalance    Encouraged decrease lamictal dosing - pt to  check with psych. Encouraged walker use.      Right leg pain - Primary    Strong distal pulses. Doubt DVT as anticoagulated.  Point tender medial knee and medial distal thigh - ?bursitis. Treat with voltaren gel, continue tylenol. Pt and husband agree with plan.  She was prescribed tramadol at ER - discussed increased fall risk on medication, will refill #15 for limited Rx      Spondylolisthesis, grade 3    Discussed tylenol, heating pad, ice, topical rub.           Follow up plan: No Follow-up on file.  Eustaquio Boyden, MD

## 2016-06-13 NOTE — Assessment & Plan Note (Signed)
This has resolved.

## 2016-06-20 ENCOUNTER — Other Ambulatory Visit: Payer: Self-pay

## 2016-06-20 ENCOUNTER — Ambulatory Visit (INDEPENDENT_AMBULATORY_CARE_PROVIDER_SITE_OTHER): Payer: Commercial Managed Care - HMO

## 2016-06-20 DIAGNOSIS — I739 Peripheral vascular disease, unspecified: Secondary | ICD-10-CM | POA: Diagnosis not present

## 2016-06-20 DIAGNOSIS — Z5181 Encounter for therapeutic drug level monitoring: Secondary | ICD-10-CM

## 2016-06-20 LAB — POCT INR: INR: 2.2

## 2016-06-20 NOTE — ED Provider Notes (Signed)
[facility]-EMERGENCY DEPT Provider Note   CSN: [id number] Arrival date & time: 06/08/16  2224     History   Chief Complaint Chief Complaint  Patient presents with  . Fall  . Leg Pain  . Arm Pain    HPI Kathryn Mathews is a 70 y.o. female.  Patient presents to the emergency department for severe pain in the right thigh and left forearm after a fall 6 days ago. She fell while in the bathtub and was able to get up without much pain. She did well for several days with onset significant pain yesterday causing difficulty walking. The patient has a history of DVT/PE on Coumadin, last checked last week. She denies head injury during the previous fall. Denies chest, neck or abdominal pain. No recurrent fall, lightheadedness, fever, numbness. She has been taking Tramdol at home without relief.    The history is provided by the patient and the spouse.  Fall  Pertinent negatives include no chest pain, no abdominal pain and no shortness of breath.  Leg Pain   Pertinent negatives include no numbness.  Arm Pain  Pertinent negatives include no chest pain, no abdominal pain and no shortness of breath.    Past Medical History:  Diagnosis Date  . Alopecia    well controlled per previous PCP - improved with biotin  . Anxiety   . Anxiety    when around ppl  . Aorto-iliac atherosclerosis (HCC) 05/2016   by xray  . Bipolar disorder (HCC)    no psych  . Chronic sinusitis   . Closed left humeral fracture 2010   closed left humeral neck fracture, also h/o L wrist fx  . Closed right humeral fracture 2011   coracoid nondisplaced, conservative therapy (Dr. Cleon Gustin)  . Depression   . Diverticulosis    by colonoscopy  . Falls    completed HHPT 10/2011, declined outpt PT  . GERD (gastroesophageal reflux disease)   . Glucose intolerance (impaired glucose tolerance)   . High cholesterol   . History of chicken pox   . 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 HLD (hyperlipidemia)   . Humerus fracture   . Hyperactivity of bladder   . Hypothyroid    previously hyperthyroid  . Hypothyroidism   . Osteopenia 2010, 2016   DEXA 2010: T -1.3 L spine, -2.1 femoral neck. DEXA 2016: T -2.4 hip, T -0.5 spine; rpt 2 yrs  . PAD (peripheral artery disease) (HCC)   . Phlebitis    left leg stays swollen  . Positive ANA (antinuclear antibody)   . Pulmonary embolism [facility])     Patient Active Problem List   Diagnosis Date Noted  . Right leg pain 06/13/2016  . Aorto-iliac atherosclerosis (HCC) 05/14/2016  . TIA (transient ischemic attack) 10/27/2015  . Dysuria 05/24/2015  . Health maintenance examination 04/12/2015  . Insomnia 04/12/2015  . Chronic nausea 09/09/2014  . Anginal chest pain at rest [facility]) 09/09/2014  . Shortness of breath   . Abnormal nuclear stress test 09/04/2014  . Essential hypertension 09/02/2014  . Advanced care planning/counseling discussion 04/10/2014  . Recurrent falls 03/28/2014  . Encounter for therapeutic drug monitoring 08/22/2013  . Ventral hernia 09/07/2012  . Unspecified constipation 09/07/2012  . Abdominal discomfort 08/09/2012  . Spondylolisthesis, grade 3 07/15/2012  . Compression fracture of L1  lumbar vertebra (HCC) 07/15/2012  . Loss of taste 03/09/2012  . Imbalance 09/18/2011  . Dysphagia 07/21/2011  . Medicare annual wellness visit, subsequent 02/18/2011  . Vitamin D deficiency 02/18/2011  . Osteopenia   . Bipolar disorder (HCC)   . GERD (gastroesophageal reflux disease)   . HLD (hyperlipidemia)   . Hypothyroid   . History of DVT (deep vein thrombosis)   . PAD (peripheral artery disease) (HCC)     Past Surgical History:  Procedure Laterality Date  . ARTERIAL THROMBECTOMY  07/2009   with R mid popliteal artery angioplasty [balloon] (2011)[[[[  . CARDIAC CATHETERIZATION  08/2014   nl EF, small branch OM  not amenable to PCI [name]([place])  . CESAREAN SECTION    . CESAREAN SECTION  1973  . CHOLECYSTECTOMY    . COLONOSCOPY  08/2008   int/ext hem, severe diverticulosis with evidence of itis, rec rpt 10 yrs  . hospitalization  01/2007   depression, ECT  . INGUINAL HERNIA REPAIR  1980s   x2 on right  . LEFT HEART CATHETERIZATION WITH CORONARY ANGIOGRAM N/A 09/05/2014   Procedure: LEFT HEART CATHETERIZATION WITH CORONARY ANGIOGRAM;  Surgeon: Marykay Lexavid W [facility], MD;  [facility];  Service: Cardiovascular;  Laterality: N/A;    OB History    Gravida Para Term Preterm AB Living   0 0 0 0 0     SAB TAB Ectopic Multiple Live Births   0 0 0           Home Medications    Prior to Admission medications   Medication Sig Start Date End Date Taking? Authorizing Provider  acetaminophen (TYLENOL) 500 MG tablet Take 500 mg by mouth every 8 (eight) hours as needed for moderate pain.    Historical Provider, MD  ARIPiprazole (ABILIFY) 5 MG tablet Take 1 tablet (5 mg total) by mouth at bedtime. 05/06/16   Audery AmelJohn T Clapacs, MD  atorvastatin (LIPITOR) 20 MG tablet Take 1 tablet (20 mg total) by mouth daily. 11/28/15   Anson FretAntonia B Ahern, MD  Biotin 1000 MCG tablet Take 1 tablet (1 mg total) by mouth 2 (two) times daily. 04/24/16   Eustaquio BoydenJavier Gutierrez, MD  buPROPion (WELLBUTRIN SR) 200 MG 12 hr tablet Take 1 tablet (200 mg total) by mouth 2 (two) times daily. 02/04/16   Audery AmelJohn T Clapacs, MD  Calcium Carb-Cholecalciferol (CALCIUM-VITAMIN D) 600-400 MG-UNIT TABS Take 1 tablet by mouth daily.    Historical Provider, MD  Cholecalciferol (VITAMIN D) 2000 units CAPS Take 1 capsule (2,000 Units total) by mouth daily. 04/24/16   Eustaquio BoydenJavier Gutierrez, MD  clonazePAM (KLONOPIN) 1 MG tablet Take 1 tablet (1 mg total) by mouth 2 (two) times daily. 05/06/16   Audery AmelJohn T Clapacs, MD  diclofenac sodium (VOLTAREN) 1 % GEL Apply 1 application topically 2 (two) times daily as needed. 06/13/16   Eustaquio BoydenJavier Gutierrez, MD  Docusate Calcium (STOOL  SOFTENER PO) Take 1 tablet by mouth daily as needed (constipation). Reported on 11/05/2015    Historical Provider, MD  lamoTRIgine (LAMICTAL) 100 MG tablet Take 1 tablet (100 mg total) by mouth 2 (two) times daily. 02/04/16   Audery AmelJohn T Clapacs, MD  levothyroxine (SYNTHROID, LEVOTHROID) 50 MCG tablet TAKE 1 TABLET EVERY DAY 10/01/15   Eustaquio BoydenJavier Gutierrez, MD  PRESCRIPTION MEDICATION Place 2 drops into both eyes daily as needed (dryness).    Historical Provider, MD  traMADol (ULTRAM) 50 MG tablet Take 1 tablet (50 mg total) by mouth 2 (two) times daily as needed. 06/13/16  Eustaquio Boyden, MD  warfarin (COUMADIN) 5 MG tablet TAKE DAILY AS DIRECTED BY COUMADIN CLINIC. 05/09/16   Eustaquio Boyden, MD    Family History Family History  Problem Relation Age of Onset  . Cancer Father   . Heart attack Mother 75  . Coronary artery disease Mother 70  . Hyperlipidemia Mother   . Heart disease Mother   . Hypertension Mother   . Breast cancer Maternal Aunt 54  . Coronary artery disease Maternal Uncle   . Coronary artery disease Maternal Grandmother 70  . Stroke Neg Hx   . Diabetes Neg Hx     Social History Social History  Substance Use Topics  . Smoking status: Never Smoker  . Smokeless tobacco: Never Used  . Alcohol use No     Allergies   Codeine; Acyclovir and related; and Hydrocodone   Review of Systems Review of Systems  Constitutional: Negative for diaphoresis and fever.  HENT: Negative.   Respiratory: Negative.  Negative for shortness of breath.   Cardiovascular: Negative.  Negative for chest pain.  Gastrointestinal: Negative.  Negative for abdominal pain and nausea.  Genitourinary: Negative.  Negative for dysuria.  Musculoskeletal:       See HPI.  Skin: Negative.  Negative for wound.  Neurological: Negative.  Negative for weakness and numbness.     Physical Exam Updated Vital Signs BP 123/77   Pulse 72   Temp 98.1 F (36.7 C) (Oral)   Resp 18   SpO2 99%   Physical Exam    Constitutional: She appears well-developed and well-nourished.  HENT:  Head: Normocephalic and atraumatic.  Neck: Normal range of motion. Neck supple.  Cardiovascular: Normal rate and regular rhythm.   Pulmonary/Chest: Effort normal and breath sounds normal. She has no wheezes. She has no rales. She exhibits no tenderness.  Abdominal: Soft. Bowel sounds are normal. There is no tenderness. There is no rebound and no guarding.  Musculoskeletal: Normal range of motion.  There is a large healing bruise to right proximal inner thigh without palpable hematoma. No deformity. No pelvic tenderness. Left forearm is without swelling, wound and is non-tender. There is tenderness to proximal humerus. FROM all joints. No midline cervical tenderness.   Neurological: She is alert.  Skin: Skin is warm and dry. No rash noted.  Psychiatric: She has a normal mood and affect.     ED Treatments / Results  Labs (all labs ordered are listed, but only abnormal results are displayed) Labs Reviewed  PROTIME-INR - Abnormal; Notable for the following:       Result Value   Prothrombin Time 32.8 (*)    All other components within normal limits   Results for orders placed or performed during the hospital encounter of 06/08/16  Protime-INR  Result Value Ref Range   Prothrombin Time 32.8 (H) 11.4 - 15.2 seconds   INR 3.12      EKG  EKG Interpretation None       Radiology No results found.  Procedures Procedures (including critical care time)  Medications Ordered in ED Medications  traMADol (ULTRAM) tablet 50 mg (50 mg Oral Given 06/09/16 0113)     Initial Impression / Assessment and Plan / ED Course  I have reviewed the triage vital signs and the nursing notes.  Pertinent labs & imaging results that were available during my care of the patient were reviewed by me and considered in my medical decision making (see chart for details).  Clinical Course     Patient  presents with severe pain to  right thigh and left arm after fall nearly one week before. Imaging to negative for acute finding. The patient refused to ambulate stating the pain was too great.   Discussed with Dr. Clydene PughKnott. Exam is essentially unremarkable. Imaging negative. VSS. The patient is appropriate for discharge.  Patient unhappy about plan to discharge stating she cannot walk. However, the patient is able to ambulate with a walker (which she has at home) from the bed to the bedside commode without instability.   Final Clinical Impressions(s) / ED Diagnoses   Final diagnoses:  Pelvic pain    New Prescriptions Discharge Medication List as of 06/09/2016  2:40 AM    START taking these medications   Details  traMADol (ULTRAM) 50 MG tablet Take 1 tablet (50 mg total) by mouth every 6 (six) hours as needed., Starting Mon 06/09/2016, Print         Elpidio AnisShari Charidy Cappelletti, PA-C 06/20/16 0126    Lyndal Pulleyaniel Knott, MD 06/20/16 1054

## 2016-06-20 NOTE — Patient Instructions (Signed)
Pre visit review using our clinic review tool, if applicable. No additional management support is needed unless otherwise documented below in the visit note.  Patient has been taking aleve OTC at least 2 times daily for past 1-2 weeks.  I have educated patient of increased risk of bleeding and interaction with coumadin and informed patient to stop the medication. Patient and husband verbalize understanding.  She will manage pain with Tylenol prn and Tramadol as prescribed by Md.

## 2016-06-20 NOTE — Telephone Encounter (Signed)
Patient is requesting a refill on her Tramadol due to ongoing knee and hip pain.   Pain is slowly improving.  She is ambulating better than at last office visit.  Last seen for this problem by Dr. Sharen HonesGutierrez: 06/13/16.   Last filled #15 of Tramadol:  06/13/16  Patient is currently taking coumadin and admitted to taking aleve for breakthrough pain.  Educated on risks of this and instructed not to take any aleve.    Please advise if okay to refill pending medication.

## 2016-06-22 MED ORDER — TRAMADOL HCL 50 MG PO TABS: 50.0000 mg | ORAL_TABLET | Freq: Two times a day (BID) | ORAL | 0 refills | Status: DC | PRN

## 2016-06-23 NOTE — Telephone Encounter (Signed)
Rx called in as directed.   

## 2016-07-10 ENCOUNTER — Ambulatory Visit (INDEPENDENT_AMBULATORY_CARE_PROVIDER_SITE_OTHER): Payer: Commercial Managed Care - HMO | Admitting: Family Medicine

## 2016-07-10 ENCOUNTER — Encounter: Payer: Self-pay | Admitting: Family Medicine

## 2016-07-10 VITALS — BP 130/76 | HR 84 | Temp 98.3°F | Wt 149.5 lb

## 2016-07-10 DIAGNOSIS — M25551 Pain in right hip: Secondary | ICD-10-CM | POA: Diagnosis not present

## 2016-07-10 MED ORDER — TRAMADOL HCL 50 MG PO TABS: 50.0000 mg | ORAL_TABLET | Freq: Two times a day (BID) | ORAL | 0 refills | Status: DC | PRN

## 2016-07-10 NOTE — Progress Notes (Signed)
Pre visit review using our clinic review tool, if applicable. No additional management support is needed unless otherwise documented below in the visit note. 

## 2016-07-10 NOTE — Progress Notes (Signed)
BP 130/76   Pulse 84   Temp 98.3 F (36.8 C) (Oral)   Wt 149 lb 8 oz (67.8 kg)   BMI 34.12 kg/m    CC: R leg pain Subjective:    Patient ID: Kathryn Mathews, female    DOB: [birth year], 70 y.o.   MRN: [id number]  HPI: Kathryn Mathews is a 70 y.o. female presenting on 07/10/2016 for Leg Pain (right leg; still hurts; sometimes leg gives out)   See prior note for details. Seen here 06/13/2016 for ER f/u after she suffered fall 06/02/16. Pelvic xray at ER unrevaling. Lumbar spine and L humeral xrays also normal. H/o recurrent falls previously attributed to psychotropic (lamictal). Eval at that time with strong distal pulses and no swelling asymmetry (and on anticoagulation).   Treating with tramadol and voltaren gel.   Known grade 3 spondylolisthesis  Persistent pain at upper thigh, right buttock, sometimes leg gives out with walking. Denies numbness of leg, bowel/bladder accidents. Some weakness of leg.   Increased rhinorrhea - taking generic allergy pill as well as flonase.   Requests handicap placard. Not using assistive device - declines. Unable to walk more than 200 ft.   Relevant past medical, surgical, family and social history reviewed and updated as indicated. Interim medical history since our last visit reviewed. Allergies and medications reviewed and updated. Current Outpatient Prescriptions on File Prior to Visit  Medication Sig  . acetaminophen (TYLENOL) 500 MG tablet Take 500 mg by mouth every 8 (eight) hours as needed for moderate pain.  . ARIPiprazole (ABILIFY) 5 MG tablet Take 1 tablet (5 mg total) by mouth at bedtime.  Marland Kitchen atorvastatin (LIPITOR) 20 MG tablet Take 1 tablet (20 mg total) by mouth daily.  . Biotin 1000 MCG tablet Take 1 tablet (1 mg total) by mouth 2 (two) times daily.  Marland Kitchen buPROPion (WELLBUTRIN SR) 200 MG 12 hr tablet Take 1 tablet (200 mg total) by mouth 2 (two) times daily.  . Calcium Carb-Cholecalciferol (CALCIUM-VITAMIN D) 600-400 MG-UNIT TABS Take 1  tablet by mouth daily.  . Cholecalciferol (VITAMIN D) 2000 units CAPS Take 1 capsule (2,000 Units total) by mouth daily.  . clonazePAM (KLONOPIN) 1 MG tablet Take 1 tablet (1 mg total) by mouth 2 (two) times daily.  . diclofenac sodium (VOLTAREN) 1 % GEL Apply 1 application topically 2 (two) times daily as needed.  Tery Sanfilippo Calcium (STOOL SOFTENER PO) Take 1 tablet by mouth daily as needed (constipation). Reported on 11/05/2015  . lamoTRIgine (LAMICTAL) 100 MG tablet Take 1 tablet (100 mg total) by mouth 2 (two) times daily.  Marland Kitchen levothyroxine (SYNTHROID, LEVOTHROID) 50 MCG tablet TAKE 1 TABLET EVERY DAY  . PRESCRIPTION MEDICATION Place 2 drops into both eyes daily as needed (dryness).  . warfarin (COUMADIN) 5 MG tablet TAKE DAILY AS DIRECTED BY COUMADIN CLINIC.   No current facility-administered medications on file prior to visit.     Review of Systems Per HPI unless specifically indicated in ROS section     Objective:    BP 130/76   Pulse 84   Temp 98.3 F (36.8 C) (Oral)   Wt 149 lb 8 oz (67.8 kg)   BMI 34.12 kg/m   Wt Readings from Last 3 Encounters:  07/10/16 149 lb 8 oz (67.8 kg)  06/04/16 148 lb 8 oz (67.4 kg)  04/24/16 148 lb 8 oz (67.4 kg)    Physical Exam  Constitutional: She is oriented to person, place, and time. She appears well-developed and  well-nourished. No distress.  Musculoskeletal: She exhibits no edema.  2+ DP bilaterally No pain midline spine + paraspinous mm tenderness Neg SLR bilaterally. + pain localizing to lateral thigh with ext rotation at hip. Tender to palpation at GTB and sciatic notch on right  Neurological: She is alert and oriented to person, place, and time.  Unsteady getting on exam table.   Skin: Skin is warm and dry. No rash noted.  Psychiatric: She has a normal mood and affect.  Nursing note and vitals reviewed.  Results for orders placed or performed in visit on 06/20/16  POCT INR  Result Value Ref Range   INR 2.2         Assessment & Plan:  Over 25 minutes were spent face-to-face with the patient during this encounter and >50% of that time was spent on counseling and coordination of care  Problem List Items Addressed This Visit    Right hip pain - Primary    Again strong distal pedal pulses, no pedal edema. Pain today localizes to lateral hip and buttock - consistent with sciatica and greater trochanteric bursitis. Anticipate hip adductor weakness contributes to unsteady gait. Will refer to outpatient PT for further assistance/treatment.  Handicap placard provided today.  Continue tramadol PRN pain. Intolerance to codeine and hydrocodone. Avoid NSAIDs with coumadin. Pt and husband agree with plan.      Relevant Orders   Ambulatory referral to Physical Therapy       Follow up plan: Return if symptoms worsen or fail to improve.  Eustaquio Boyden, MD

## 2016-07-10 NOTE — Assessment & Plan Note (Signed)
Again strong distal pedal pulses, no pedal edema. Pain today localizes to lateral hip and buttock - consistent with sciatica and greater trochanteric bursitis. Anticipate hip adductor weakness contributes to unsteady gait. Will refer to outpatient PT for further assistance/treatment.  Handicap placard provided today.  Continue tramadol PRN pain. Intolerance to codeine and hydrocodone. Avoid NSAIDs with coumadin. Pt and husband agree with plan.

## 2016-07-10 NOTE — Patient Instructions (Addendum)
Continue tramadol for pain. We will refer you to physical therapy I am concerned for both sciatica and hip bursitis. Handout on exercises to do at home provided today.  Let us know if not improving with treatment.

## 2016-07-11 ENCOUNTER — Ambulatory Visit (INDEPENDENT_AMBULATORY_CARE_PROVIDER_SITE_OTHER): Payer: Commercial Managed Care - HMO

## 2016-07-11 ENCOUNTER — Telehealth: Payer: Self-pay

## 2016-07-11 DIAGNOSIS — Z5181 Encounter for therapeutic drug level monitoring: Secondary | ICD-10-CM | POA: Diagnosis not present

## 2016-07-11 DIAGNOSIS — I739 Peripheral vascular disease, unspecified: Secondary | ICD-10-CM

## 2016-07-11 LAB — POCT INR: INR: 1.8

## 2016-07-11 NOTE — Telephone Encounter (Signed)
Message left advising patient's husband.

## 2016-07-11 NOTE — Patient Instructions (Signed)
Pre visit review using our clinic review tool, if applicable. No additional management support is needed unless otherwise documented below in the visit note. 

## 2016-07-11 NOTE — Telephone Encounter (Signed)
As discussed yesterday, I'm hesitant to do anything stronger than tramadol as she has listed intolerances to codeine and hydrocodone (which would be next meds). Recommend give PT a chance, use lubricating eye drops and may take OTC claritin for runny nose.  Again, next step would be hydrocodone but I"m hesitant for this as it previously caused unsteadiness and she's already at increased risk of falls.

## 2016-07-11 NOTE — Telephone Encounter (Signed)
Patient requests a change in pain medication from Tramadol to something else.  She states that she has an allergy to it which has been causing her to have dry, itchy eyes and constant nasal drainage which is irritating to her.  (this has occurred the whole time she has been taking the medicine but didn't realize until reading the package insert last pm).    Currently, she has been using Tramadol regularly with Tylenol for breakthrough pain for her low back, right hip and leg pain.    If the change is an R/X that can be sent in directly, please send to [facility].  Please advise.

## 2016-07-13 ENCOUNTER — Other Ambulatory Visit: Payer: Self-pay | Admitting: Family Medicine

## 2016-07-28 ENCOUNTER — Other Ambulatory Visit: Payer: Self-pay | Admitting: *Deleted

## 2016-07-28 MED ORDER — TRAMADOL HCL 50 MG PO TABS: 50.0000 mg | ORAL_TABLET | Freq: Two times a day (BID) | ORAL | 0 refills | Status: DC | PRN

## 2016-07-28 NOTE — Telephone Encounter (Signed)
Ok to refill? Last filled 07/10/16 #30 0RF. Wants Rx today

## 2016-07-28 NOTE — Telephone Encounter (Signed)
phoned in. 

## 2016-07-29 ENCOUNTER — Ambulatory Visit (INDEPENDENT_AMBULATORY_CARE_PROVIDER_SITE_OTHER): Payer: Commercial Managed Care - HMO

## 2016-07-29 DIAGNOSIS — I739 Peripheral vascular disease, unspecified: Secondary | ICD-10-CM | POA: Diagnosis not present

## 2016-07-29 DIAGNOSIS — Z5181 Encounter for therapeutic drug level monitoring: Secondary | ICD-10-CM

## 2016-07-29 LAB — POCT INR: INR: 1.7

## 2016-07-29 NOTE — Patient Instructions (Addendum)
Pre visit review using our clinic review tool, if applicable. No additional management support is needed unless otherwise documented below in the visit note.  INR 1.7 today  Patient denies any missed doses and cannot account for any changes in diet, health or medications.  Husband present and assists her with her medications and cannot account for any changes.  Patient is to take an extra 1/2 pill today (7.5mg ) and then take 1 pill (5mg ) daily EXCEPT for 1/2 pill (2.5mg ) on Mondays and Fridays, recheck in 2 weeks. Patient and husband educated on risks associated with a subtherapeutic level and written instructions from today's visit given and reviewed, both, verbalize understanding.

## 2016-07-29 NOTE — Telephone Encounter (Signed)
Rx called in as directed.   

## 2016-08-04 DIAGNOSIS — M6281 Muscle weakness (generalized): Secondary | ICD-10-CM | POA: Diagnosis not present

## 2016-08-04 DIAGNOSIS — M25552 Pain in left hip: Secondary | ICD-10-CM | POA: Diagnosis not present

## 2016-08-04 DIAGNOSIS — M545 Low back pain: Secondary | ICD-10-CM | POA: Diagnosis not present

## 2016-08-04 DIAGNOSIS — M25551 Pain in right hip: Secondary | ICD-10-CM | POA: Diagnosis not present

## 2016-08-04 DIAGNOSIS — G8929 Other chronic pain: Secondary | ICD-10-CM | POA: Diagnosis not present

## 2016-08-05 ENCOUNTER — Encounter: Payer: Self-pay | Admitting: Psychiatry

## 2016-08-05 ENCOUNTER — Ambulatory Visit (INDEPENDENT_AMBULATORY_CARE_PROVIDER_SITE_OTHER): Payer: Commercial Managed Care - HMO | Admitting: Psychiatry

## 2016-08-05 VITALS — BP 109/70 | HR 81 | Temp 98.4°F | Wt 148.6 lb

## 2016-08-05 DIAGNOSIS — F411 Generalized anxiety disorder: Secondary | ICD-10-CM

## 2016-08-05 DIAGNOSIS — F333 Major depressive disorder, recurrent, severe with psychotic symptoms: Secondary | ICD-10-CM | POA: Diagnosis not present

## 2016-08-05 MED ORDER — CLONAZEPAM 1 MG PO TABS: 1.0000 mg | ORAL_TABLET | Freq: Two times a day (BID) | ORAL | 1 refills | Status: DC

## 2016-08-05 MED ORDER — ARIPIPRAZOLE 5 MG PO TABS: 5.0000 mg | ORAL_TABLET | Freq: Every day | ORAL | 4 refills | Status: DC

## 2016-08-05 MED ORDER — BUPROPION HCL ER (SR) 200 MG PO TB12: 200.0000 mg | ORAL_TABLET | Freq: Two times a day (BID) | ORAL | 1 refills | Status: DC

## 2016-08-05 MED ORDER — LAMOTRIGINE 100 MG PO TABS: 100.0000 mg | ORAL_TABLET | Freq: Two times a day (BID) | ORAL | 1 refills | Status: DC

## 2016-08-05 NOTE — Progress Notes (Signed)
Follow-up 71 year old woman with chronic depression. She actually seems to be feeling better. She took the Abilify for 3 months but now they are wanting to charge her $400 for the next copayment. No clear side effects. Says that she doesn't get into a dark place nearly as often. No suicidal thoughts. No psychotic symptoms.  Neatly dressed. Calm. Smiles a little bit more. Lucid. No suicidality.  I am very sorry that the Abilify is so expensive. I have given her one of the discount cards in hopes that that will cover her on her insurance. If not she can call back or just stopped taking it. Other medicine refilled. Follow-up 4 months.

## 2016-08-08 DIAGNOSIS — M25551 Pain in right hip: Secondary | ICD-10-CM | POA: Diagnosis not present

## 2016-08-08 DIAGNOSIS — M25552 Pain in left hip: Secondary | ICD-10-CM | POA: Diagnosis not present

## 2016-08-11 ENCOUNTER — Ambulatory Visit: Payer: Commercial Managed Care - HMO

## 2016-08-11 DIAGNOSIS — M25552 Pain in left hip: Secondary | ICD-10-CM | POA: Diagnosis not present

## 2016-08-11 DIAGNOSIS — M545 Low back pain: Secondary | ICD-10-CM | POA: Diagnosis not present

## 2016-08-11 DIAGNOSIS — M25551 Pain in right hip: Secondary | ICD-10-CM | POA: Diagnosis not present

## 2016-08-11 DIAGNOSIS — M6281 Muscle weakness (generalized): Secondary | ICD-10-CM | POA: Diagnosis not present

## 2016-08-11 DIAGNOSIS — G8929 Other chronic pain: Secondary | ICD-10-CM | POA: Diagnosis not present

## 2016-08-12 ENCOUNTER — Ambulatory Visit (INDEPENDENT_AMBULATORY_CARE_PROVIDER_SITE_OTHER): Payer: Commercial Managed Care - HMO

## 2016-08-12 DIAGNOSIS — Z5181 Encounter for therapeutic drug level monitoring: Secondary | ICD-10-CM

## 2016-08-12 DIAGNOSIS — I739 Peripheral vascular disease, unspecified: Secondary | ICD-10-CM | POA: Diagnosis not present

## 2016-08-12 LAB — POCT INR: INR: 1.5

## 2016-08-12 NOTE — Patient Instructions (Signed)
Pre visit review using our clinic review tool, if applicable. No additional management support is needed unless otherwise documented below in the visit note.  INR today 1.5  Patient denies any deviation in coumadin dosing or missed doses.  She has had no changes in health, diet or medication and denies adding any supplements or eating any greens.  Husband present and confirms patient's account and verifies that she is using her pill box.  Will have patient take an extra 1/2 pill (7.5mg ) today and tomorrow and then increase regular dosing to take 1 pill (5mg ) daily EXCEPT for 1/2 pill on Fridays only.  Recheck in 2 weeks.  Discussed risks of subtherapeutic level with patient and husband and to go to ER if any concerns develop.  Both, verbalize understanding.

## 2016-08-20 DIAGNOSIS — M545 Low back pain: Secondary | ICD-10-CM | POA: Diagnosis not present

## 2016-08-20 DIAGNOSIS — M6281 Muscle weakness (generalized): Secondary | ICD-10-CM | POA: Diagnosis not present

## 2016-08-20 DIAGNOSIS — M25551 Pain in right hip: Secondary | ICD-10-CM | POA: Diagnosis not present

## 2016-08-20 DIAGNOSIS — M25552 Pain in left hip: Secondary | ICD-10-CM | POA: Diagnosis not present

## 2016-08-20 DIAGNOSIS — G8929 Other chronic pain: Secondary | ICD-10-CM | POA: Diagnosis not present

## 2016-08-22 DIAGNOSIS — M6281 Muscle weakness (generalized): Secondary | ICD-10-CM | POA: Diagnosis not present

## 2016-08-22 DIAGNOSIS — M545 Low back pain: Secondary | ICD-10-CM | POA: Diagnosis not present

## 2016-08-22 DIAGNOSIS — M25551 Pain in right hip: Secondary | ICD-10-CM | POA: Diagnosis not present

## 2016-08-22 DIAGNOSIS — G8929 Other chronic pain: Secondary | ICD-10-CM | POA: Diagnosis not present

## 2016-08-22 DIAGNOSIS — M25552 Pain in left hip: Secondary | ICD-10-CM | POA: Diagnosis not present

## 2016-08-25 ENCOUNTER — Ambulatory Visit (INDEPENDENT_AMBULATORY_CARE_PROVIDER_SITE_OTHER): Payer: Medicare HMO

## 2016-08-25 ENCOUNTER — Ambulatory Visit (INDEPENDENT_AMBULATORY_CARE_PROVIDER_SITE_OTHER): Payer: Medicare HMO | Admitting: Family Medicine

## 2016-08-25 ENCOUNTER — Encounter: Payer: Self-pay | Admitting: Family Medicine

## 2016-08-25 VITALS — BP 122/78 | HR 72 | Temp 98.3°F | Wt 148.0 lb

## 2016-08-25 DIAGNOSIS — M25551 Pain in right hip: Secondary | ICD-10-CM | POA: Diagnosis not present

## 2016-08-25 DIAGNOSIS — Z5181 Encounter for therapeutic drug level monitoring: Secondary | ICD-10-CM | POA: Diagnosis not present

## 2016-08-25 DIAGNOSIS — R296 Repeated falls: Secondary | ICD-10-CM | POA: Diagnosis not present

## 2016-08-25 DIAGNOSIS — Z86718 Personal history of other venous thrombosis and embolism: Secondary | ICD-10-CM

## 2016-08-25 DIAGNOSIS — I739 Peripheral vascular disease, unspecified: Secondary | ICD-10-CM

## 2016-08-25 LAB — POCT INR: INR: 1.4

## 2016-08-25 NOTE — Patient Instructions (Signed)
Pre visit review using our clinic review tool, if applicable. No additional management support is needed unless otherwise documented below in the visit note.  INR today 1.4  Patient in today for a routine follow up with Dr. Sharen HonesGutierrez.  While here, finger stick INR check performed to save a repeat trip tomorrow.  Patient and husband deny any missed doses and confirm dosing schedule after giving reminder.  They also deny any changes in health, medication regimen or diet.  Last metabolic panel in 04/2016 was fairly unremarkable.  Cannot account for ongoing subtherapeutic levels despite continued dosing increases since last in range level on 06/20/16.  Patient is to take an extra (1/2) pill or 7.5mg  today and tomorrow and then increase weekly dosing to 5mg  daily.  Recheck in 2 weeks.  Patient and husband both verbalize understanding of instructions. Copy of the dosing schedule given per usual protocol for further reinforcement.  Also, discussed the risks associated with ongoing subtherapeutic level and to go to ER if any concerns develop.  Both, again, verbalize understanding.

## 2016-08-25 NOTE — Progress Notes (Signed)
Pre visit review using our clinic review tool, if applicable. No additional management support is needed unless otherwise documented below in the visit note. 

## 2016-08-25 NOTE — Patient Instructions (Signed)
I'm glad you're doing well. Continue home exercises provided by therapy. Coumadin check today.

## 2016-08-25 NOTE — Assessment & Plan Note (Signed)
Completing outpatient PT with endorsed good effect. Has HEP.

## 2016-08-25 NOTE — Progress Notes (Signed)
BP 122/78   Pulse 72   Temp 98.3 F (36.8 C) (Oral)   Wt 148 lb (67.1 kg)   BMI 33.78 kg/m    CC: 71mo f/u visit Subjective:    Patient ID: Kathryn ShownSharon J Mathews, female    DOB: [birth year], 71 y.o.   MRN: [id number]  HPI: Kathryn Mathews is a 71 y.o. female presenting on 08/25/2016 for Follow-up   Last visit referred to PT for hip strengthening in hopes of avoiding future falls. R leg stays achey but better. Improved L leg strength and range of motion.   Has coumadin clinic appt scheduled after OV today.   Relevant past medical, surgical, family and social history reviewed and updated as indicated. Interim medical history since our last visit reviewed. Allergies and medications reviewed and updated. Current Outpatient Prescriptions on File Prior to Visit  Medication Sig  . acetaminophen (TYLENOL) 500 MG tablet Take 500 mg by mouth every 8 (eight) hours as needed for moderate pain.  . ARIPiprazole (ABILIFY) 5 MG tablet Take 1 tablet (5 mg total) by mouth at bedtime.  Marland Kitchen. atorvastatin (LIPITOR) 20 MG tablet Take 1 tablet (20 mg total) by mouth daily.  . Biotin 1000 MCG tablet Take 1 tablet (1 mg total) by mouth 2 (two) times daily.  Marland Kitchen. buPROPion (WELLBUTRIN SR) 200 MG 12 hr tablet Take 1 tablet (200 mg total) by mouth 2 (two) times daily.  . Calcium Carb-Cholecalciferol (CALCIUM-VITAMIN D) 600-400 MG-UNIT TABS Take 1 tablet by mouth daily.  . Cholecalciferol (VITAMIN D) 2000 units CAPS Take 1 capsule (2,000 Units total) by mouth daily.  . clonazePAM (KLONOPIN) 1 MG tablet Take 1 tablet (1 mg total) by mouth 2 (two) times daily.  . diclofenac sodium (VOLTAREN) 1 % GEL Apply 1 application topically 2 (two) times daily as needed.  Tery Sanfilippo. Docusate Calcium (STOOL SOFTENER PO) Take 1 tablet by mouth daily as needed (constipation). Reported on 11/05/2015  . lamoTRIgine (LAMICTAL) 100 MG tablet Take 1 tablet (100 mg total) by mouth 2 (two) times daily.  Marland Kitchen. levothyroxine (SYNTHROID, LEVOTHROID) 50 MCG  tablet TAKE 1 TABLET EVERY DAY  . PRESCRIPTION MEDICATION Place 2 drops into both eyes daily as needed (dryness).  . traMADol (ULTRAM) 50 MG tablet Take 1 tablet (50 mg total) by mouth 2 (two) times daily as needed.  . warfarin (COUMADIN) 5 MG tablet TAKE DAILY AS DIRECTED BY COUMADIN CLINIC.   No current facility-administered medications on file prior to visit.     Review of Systems Per HPI unless specifically indicated in ROS section     Objective:    BP 122/78   Pulse 72   Temp 98.3 F (36.8 C) (Oral)   Wt 148 lb (67.1 kg)   BMI 33.78 kg/m   Wt Readings from Last 3 Encounters:  08/25/16 148 lb (67.1 kg)  07/10/16 149 lb 8 oz (67.8 kg)  06/04/16 148 lb 8 oz (67.4 kg)    Physical Exam  Constitutional: She appears well-developed and well-nourished. No distress.  HENT:  Mouth/Throat: Oropharynx is clear and moist. No oropharyngeal exudate.  Cardiovascular: Normal rate, regular rhythm and intact distal pulses.   Murmur (2/6 SEM ) heard. Pulmonary/Chest: Effort normal and breath sounds normal. No respiratory distress. She has no wheezes. She has no rales.  Musculoskeletal: She exhibits no edema.  Skin: Skin is warm and dry.  Psychiatric: She has a normal mood and affect.  Nursing note and vitals reviewed.  Results for orders placed or performed  in visit on 08/12/16  POCT INR  Result Value Ref Range   INR 1.5       Assessment & Plan:   Problem List Items Addressed This Visit    History of DVT (deep vein thrombosis)    Coumadin clinic later today. Some low readings recently.       Recurrent falls    Completing outpatient PT with endorsed good effect. Has HEP.       Right hip pain - Primary    Improving with physical therapy - pt motivated to continue HEP provided by PT.           Follow up plan: No Follow-up on file.  Eustaquio Boyden, MD

## 2016-08-25 NOTE — Assessment & Plan Note (Addendum)
Coumadin clinic later today. Some low readings recently.

## 2016-08-25 NOTE — Assessment & Plan Note (Signed)
Improving with physical therapy - pt motivated to continue HEP provided by PT.

## 2016-08-26 ENCOUNTER — Ambulatory Visit: Payer: Commercial Managed Care - HMO

## 2016-09-09 ENCOUNTER — Ambulatory Visit (INDEPENDENT_AMBULATORY_CARE_PROVIDER_SITE_OTHER): Payer: Medicare HMO

## 2016-09-09 DIAGNOSIS — Z5181 Encounter for therapeutic drug level monitoring: Secondary | ICD-10-CM | POA: Diagnosis not present

## 2016-09-09 DIAGNOSIS — I739 Peripheral vascular disease, unspecified: Secondary | ICD-10-CM

## 2016-09-09 LAB — POCT INR: INR: 3.2

## 2016-09-09 NOTE — Patient Instructions (Signed)
Pre visit review using our clinic review tool, if applicable. No additional management support is needed unless otherwise documented below in the visit note. 

## 2016-09-23 ENCOUNTER — Ambulatory Visit (INDEPENDENT_AMBULATORY_CARE_PROVIDER_SITE_OTHER): Payer: Medicare HMO

## 2016-09-23 DIAGNOSIS — Z5181 Encounter for therapeutic drug level monitoring: Secondary | ICD-10-CM

## 2016-09-23 DIAGNOSIS — I739 Peripheral vascular disease, unspecified: Secondary | ICD-10-CM

## 2016-09-23 LAB — POCT INR: INR: 2.2

## 2016-09-23 NOTE — Patient Instructions (Signed)
Pre visit review using our clinic review tool, if applicable. No additional management support is needed unless otherwise documented below in the visit note. 

## 2016-10-21 ENCOUNTER — Ambulatory Visit (INDEPENDENT_AMBULATORY_CARE_PROVIDER_SITE_OTHER): Payer: Medicare HMO

## 2016-10-21 DIAGNOSIS — Z5181 Encounter for therapeutic drug level monitoring: Secondary | ICD-10-CM

## 2016-10-21 DIAGNOSIS — I739 Peripheral vascular disease, unspecified: Secondary | ICD-10-CM

## 2016-10-21 LAB — POCT INR: INR: 3

## 2016-10-21 NOTE — Patient Instructions (Signed)
Pre visit review using our clinic review tool, if applicable. No additional management support is needed unless otherwise documented below in the visit note. 

## 2016-11-14 IMAGING — CT CT HEAD W/O CM
1 series · 16 of 28 positions shown, 20 images · non-contrast
Comparison: None.

CLINICAL DATA: Left arm and leg weakness and paresthesias.

EXAM:
CT HEAD WITHOUT CONTRAST
TECHNIQUE: Contiguous axial images were obtained from the base of the skull
through the vertex without intravenous contrast.

[Series 2: head 5.0 st · axial · 0.42mm/px · z∈[-101,+24]mm · 16 of 28 slices shown, 20 images]
[im 2/28  brain]
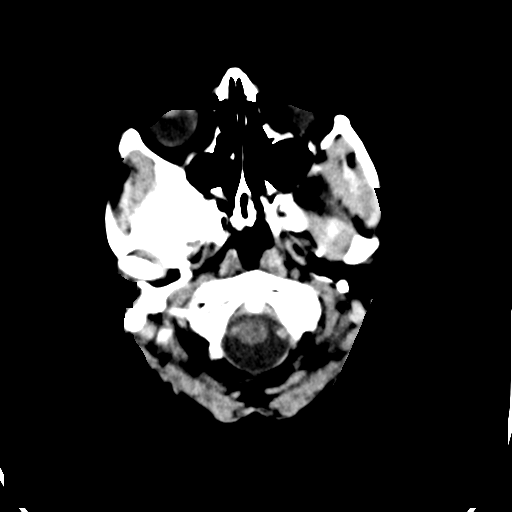
[im 2/28  bone]
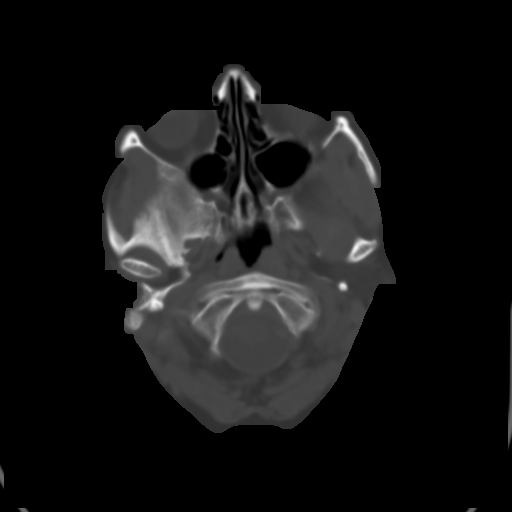
[im 4/28  brain]
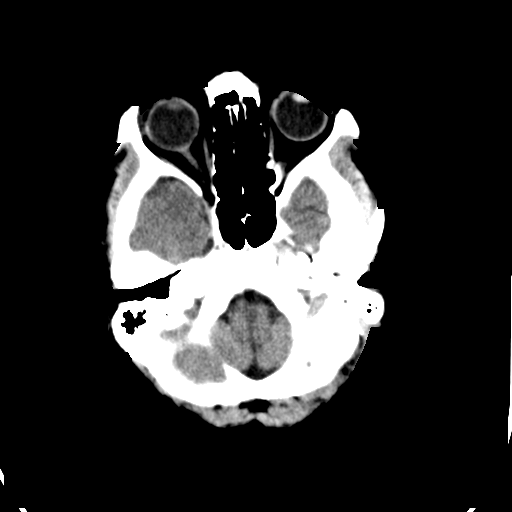
[im 6/28  brain]
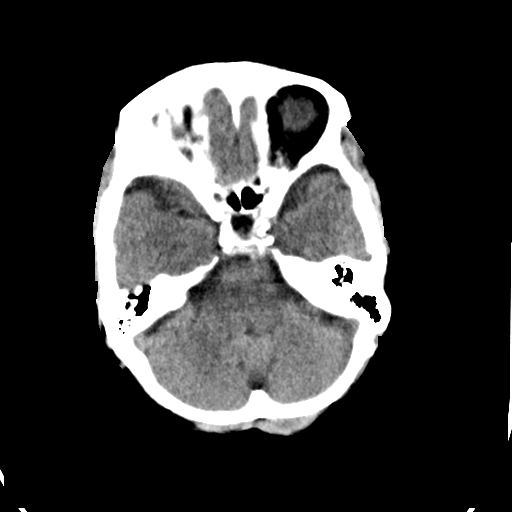
[im 7/28  brain]
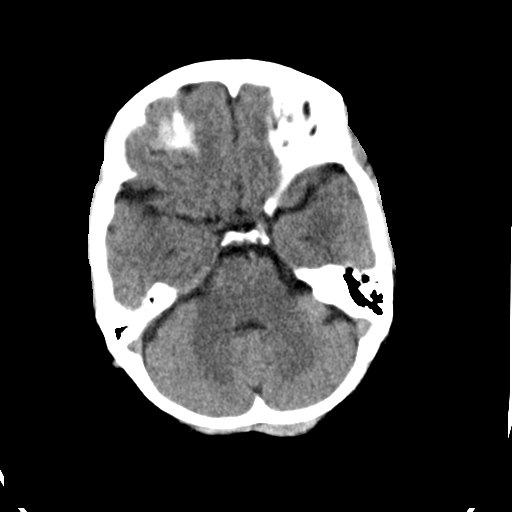
[im 9/28  brain]
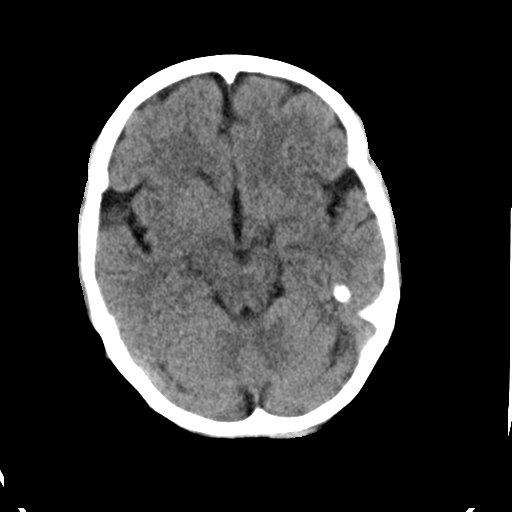
[im 9/28  bone]
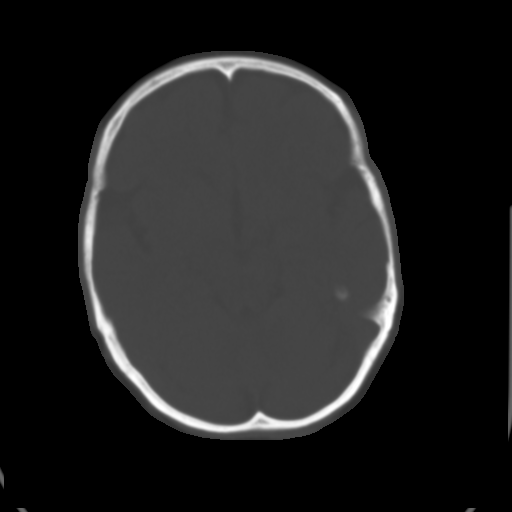
[im 10/28  brain]
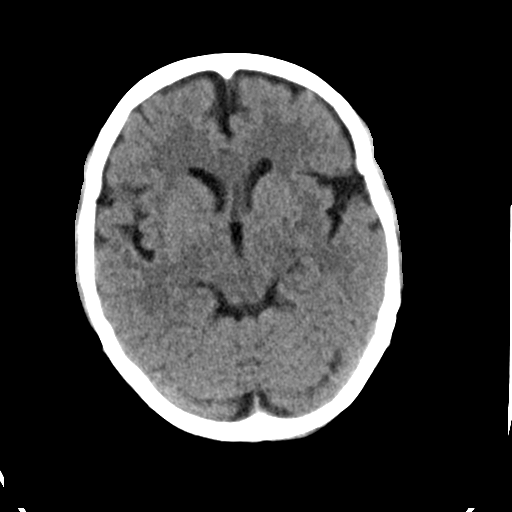
[im 12/28  brain]
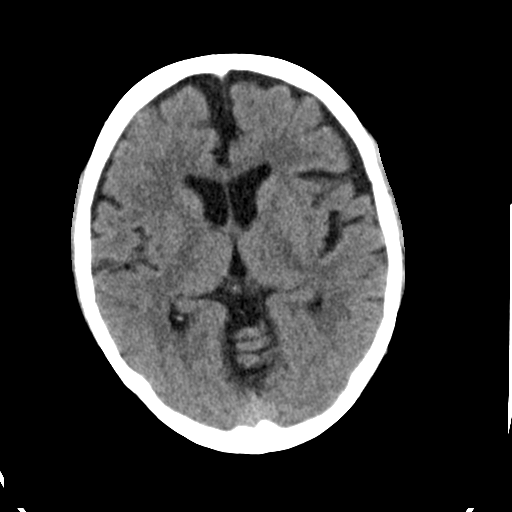
[im 14/28  brain]
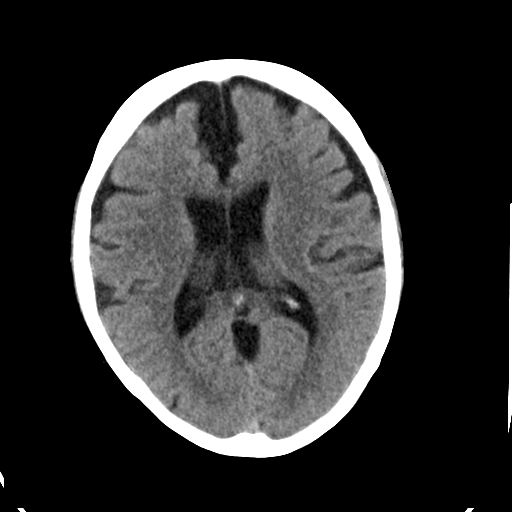
[im 15/28  brain]
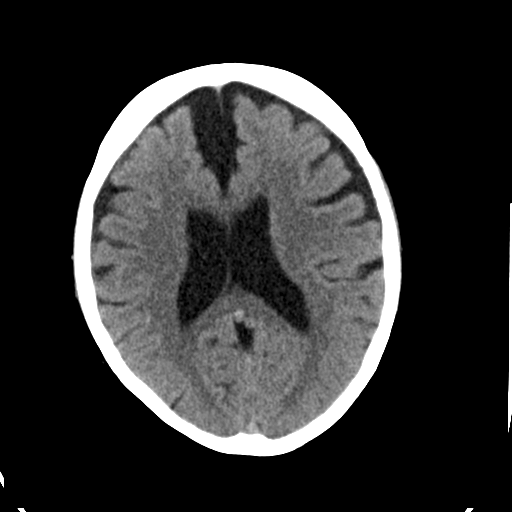
[im 15/28  bone]
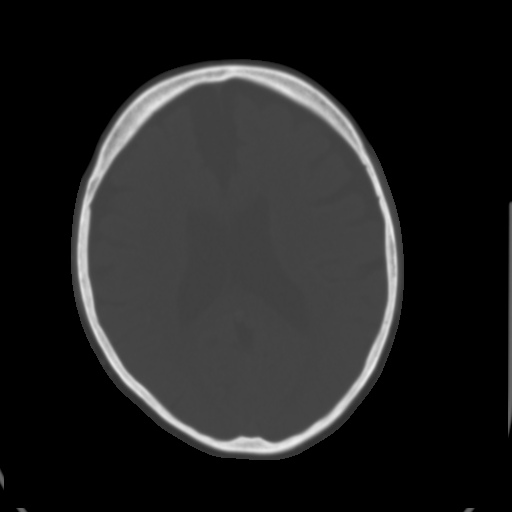
[im 17/28  brain]
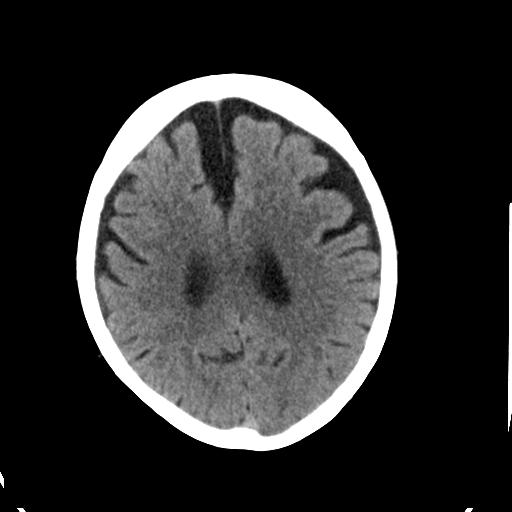
[im 19/28  brain]
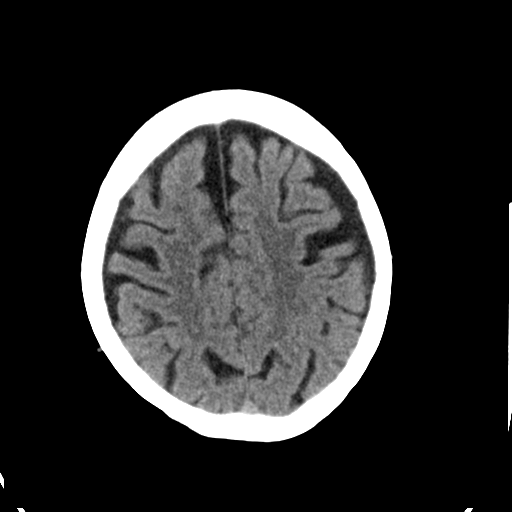
[im 20/28  brain]
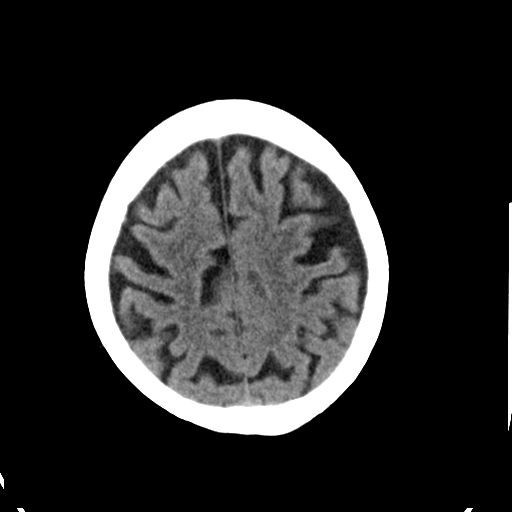
[im 22/28  brain]
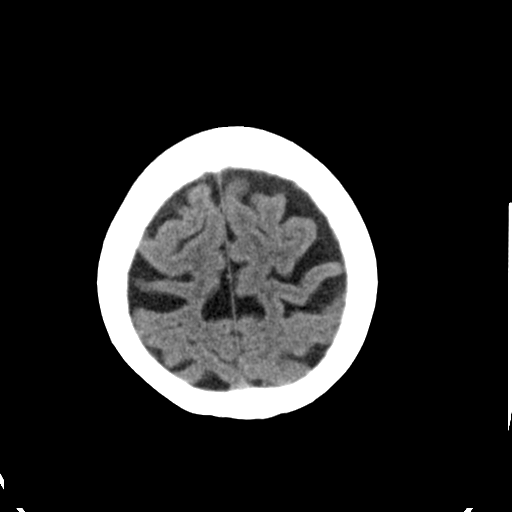
[im 22/28  bone]
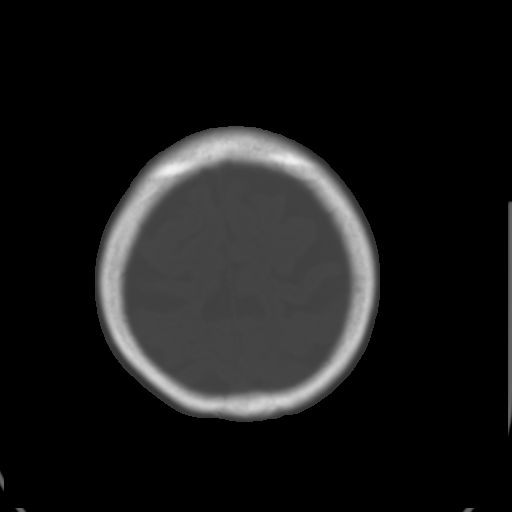
[im 23/28  brain]
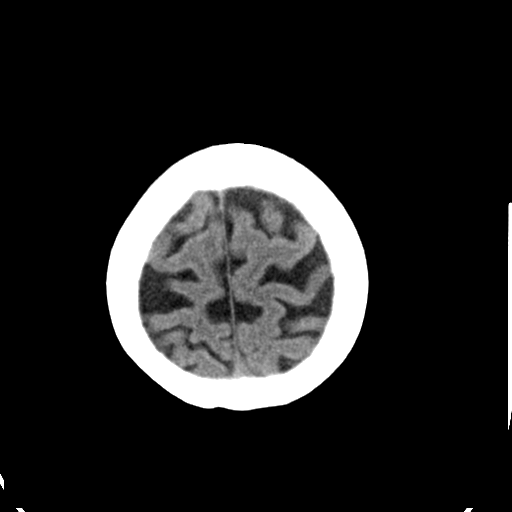
[im 25/28  brain]
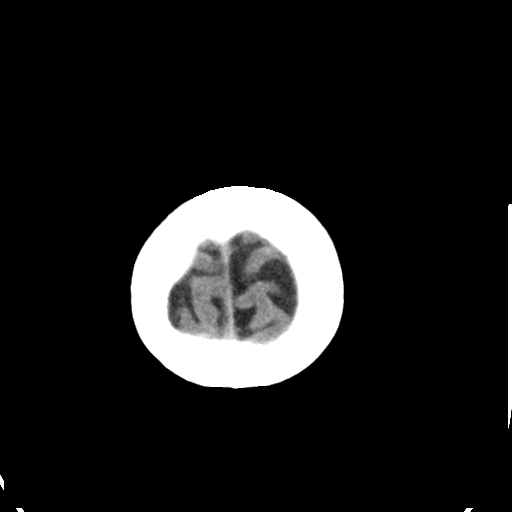
[im 27/28  brain]
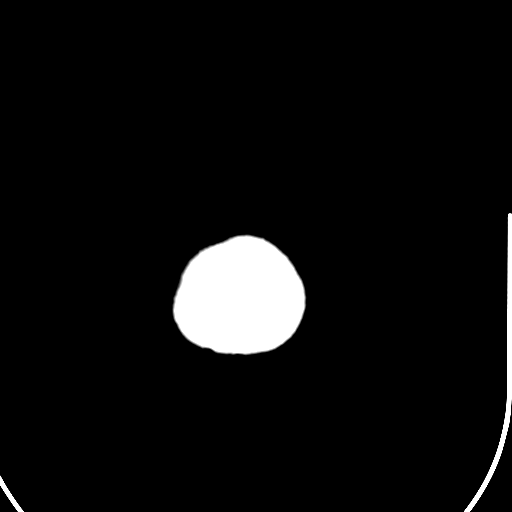

[16 of 28 positions shown; findings below may reference images not displayed]

FINDINGS: There is no intracranial hemorrhage, mass or evidence of acute
infarction. There is no extra-axial fluid collection. There is
moderate generalized atrophy. Gray matter and white matter are
unremarkable, with normal differentiation.

No bone abnormality is evident. The visible paranasal sinuses are
clear. Visible orbits are intact.
IMPRESSION: No acute findings. Moderate generalized atrophy. Critical
Value/emergent results were called by telephone at the time of
interpretation on 10/27/2015 at [DATE] to Dr. JORGIE CID , who
verbally acknowledged these results.

## 2016-11-14 IMAGING — DX DG CHEST 2V
2 series · 2 of 2 positions shown · non-contrast
Comparison: No priors.

CLINICAL DATA: 70-year-old female with tingling sensation in the
left foot since 4 a.m. this morning, now with tingling sensation in
the left arm.

EXAM:
CHEST  2 VIEW

[w chest pa]
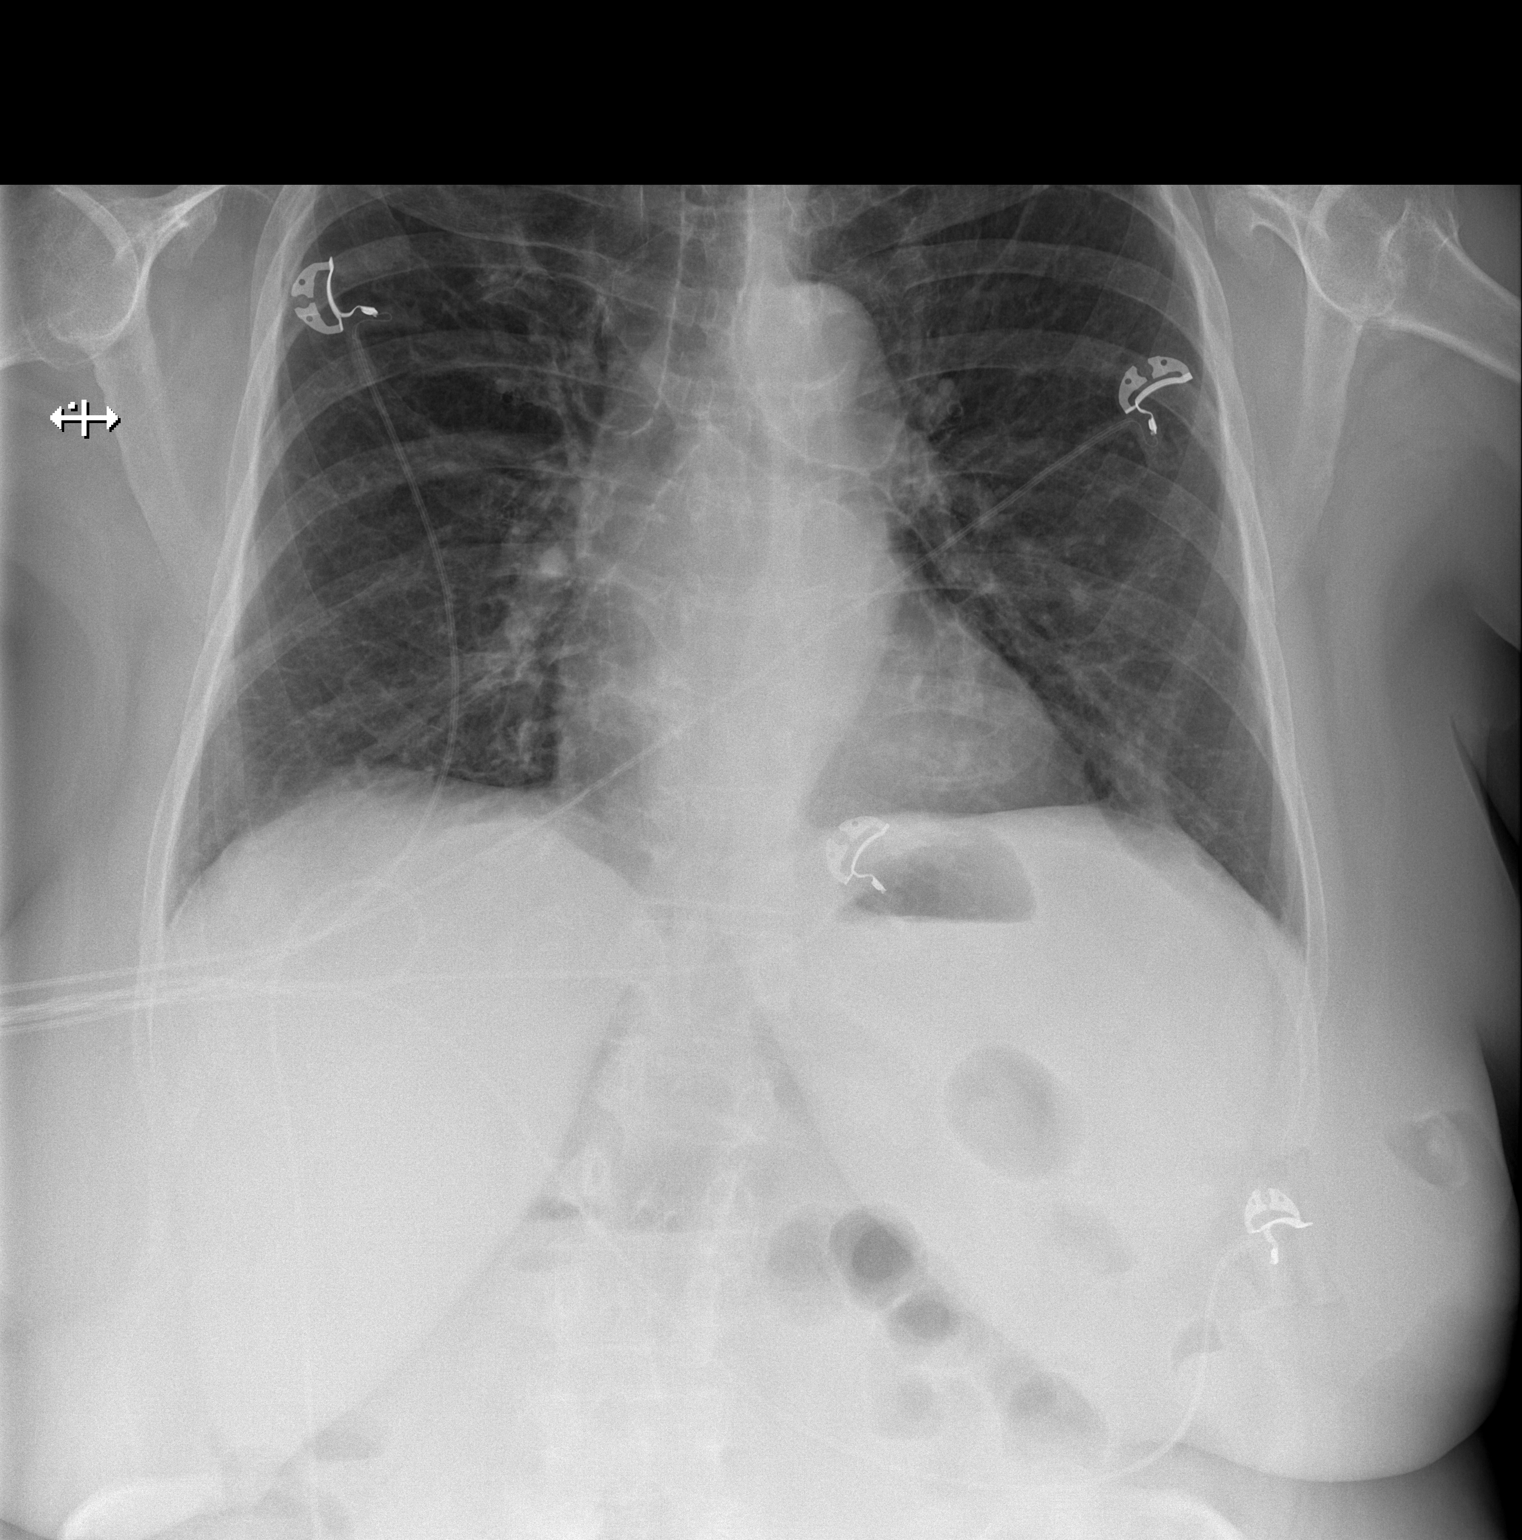

[w chest lat]
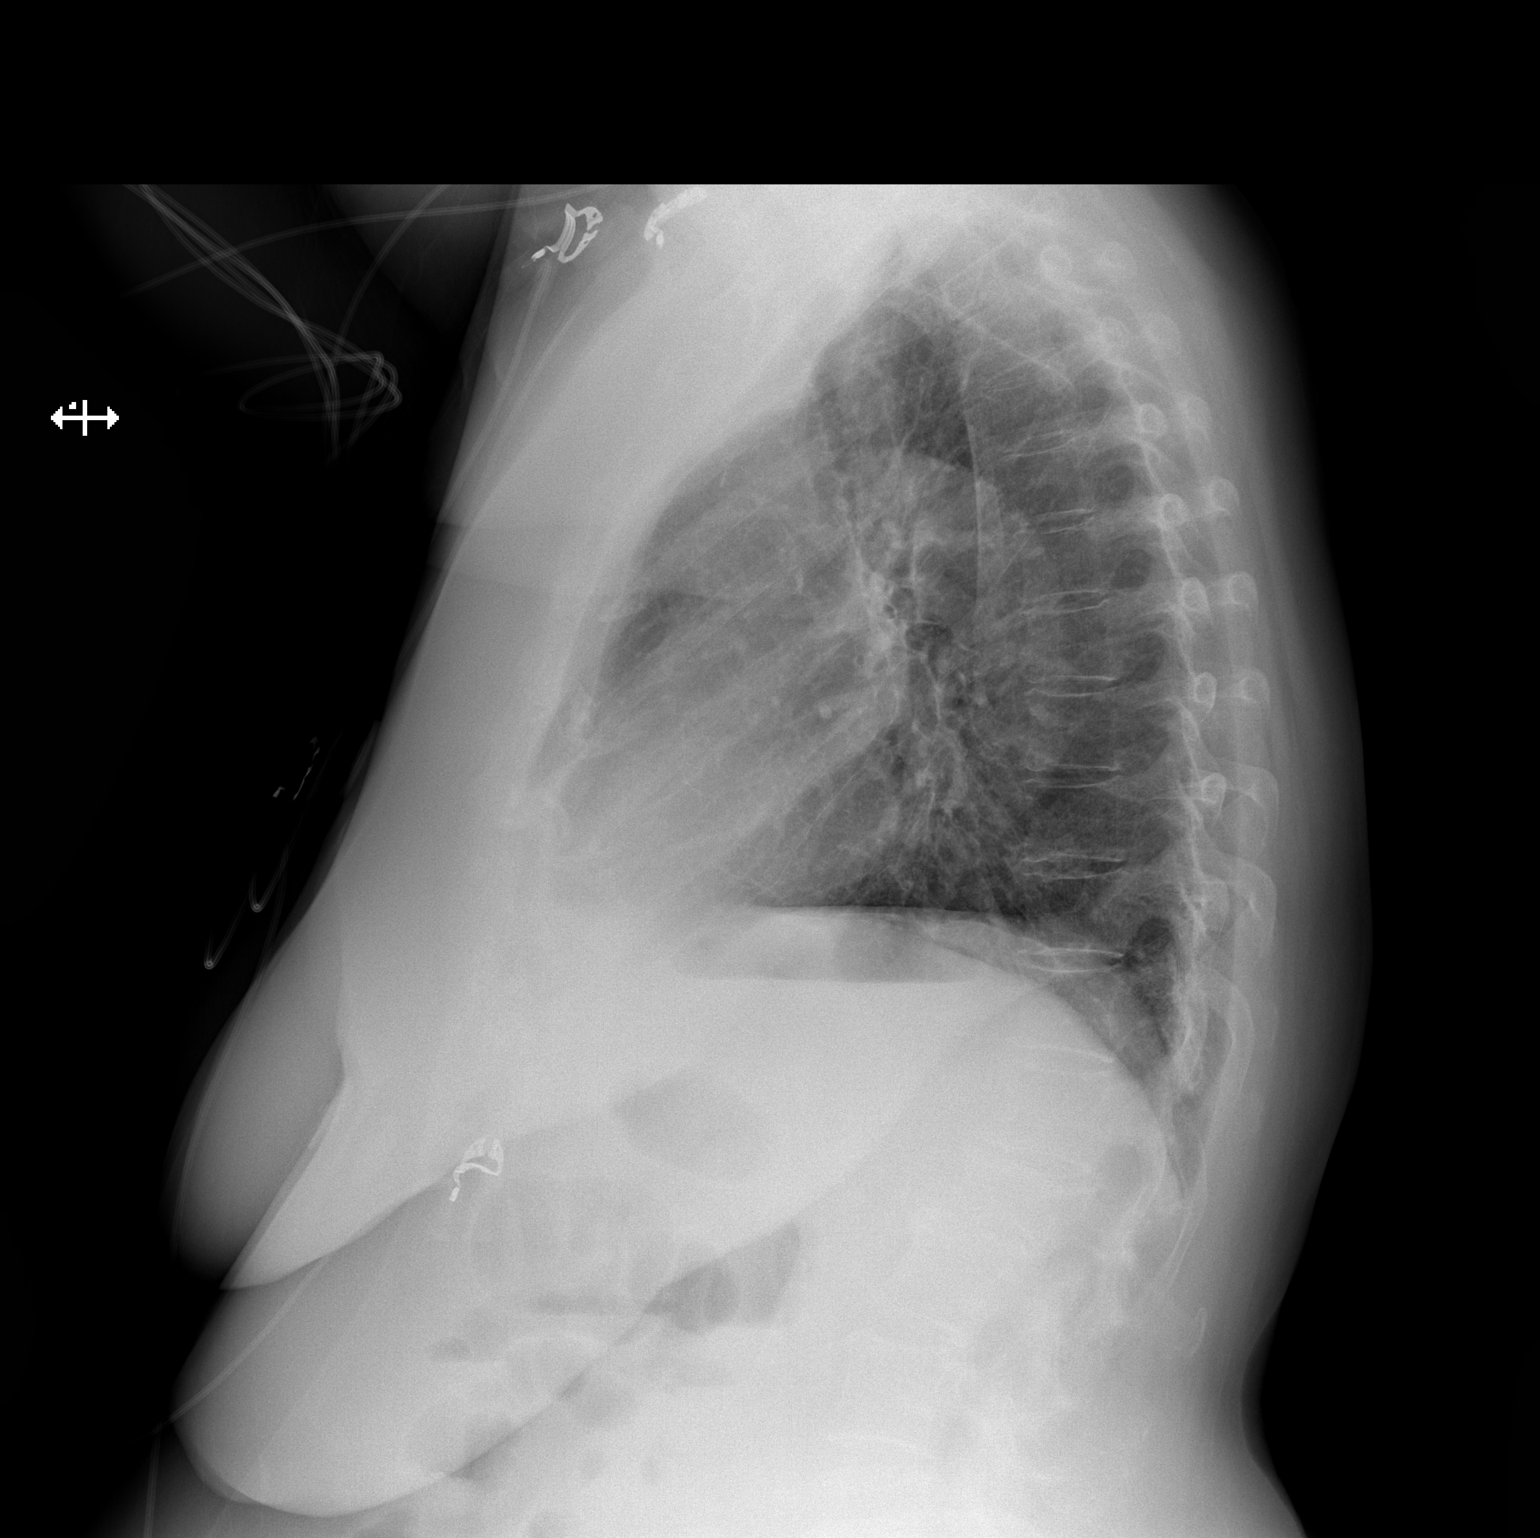

[2 of 2 positions shown; findings below may reference images not displayed]

FINDINGS: Lung volumes are normal. No consolidative airspace disease. No
pleural effusions. No pneumothorax. No pulmonary nodule or mass
noted. Pulmonary vasculature and the cardiomediastinal silhouette
are within normal limits. Atherosclerosis in the thoracic aorta.
IMPRESSION: 1.  No radiographic evidence of acute cardiopulmonary disease.
2. Atherosclerosis.

## 2016-11-18 ENCOUNTER — Ambulatory Visit: Payer: Medicare HMO

## 2016-11-18 ENCOUNTER — Ambulatory Visit (INDEPENDENT_AMBULATORY_CARE_PROVIDER_SITE_OTHER): Payer: Medicare HMO

## 2016-11-18 DIAGNOSIS — Z5181 Encounter for therapeutic drug level monitoring: Secondary | ICD-10-CM | POA: Diagnosis not present

## 2016-11-18 DIAGNOSIS — I739 Peripheral vascular disease, unspecified: Secondary | ICD-10-CM

## 2016-11-18 LAB — POCT INR: INR: 2.9

## 2016-11-18 NOTE — Patient Instructions (Signed)
Pre visit review using our clinic review tool, if applicable. No additional management support is needed unless otherwise documented below in the visit note. 

## 2016-12-02 ENCOUNTER — Ambulatory Visit: Payer: Commercial Managed Care - HMO | Admitting: Psychiatry

## 2016-12-22 ENCOUNTER — Other Ambulatory Visit: Payer: Self-pay | Admitting: Psychiatry

## 2016-12-24 ENCOUNTER — Telehealth: Payer: Self-pay | Admitting: Family Medicine

## 2016-12-24 ENCOUNTER — Encounter (HOSPITAL_COMMUNITY): Payer: Self-pay | Admitting: *Deleted

## 2016-12-24 ENCOUNTER — Emergency Department (HOSPITAL_COMMUNITY)
Admission: EM | Admit: 2016-12-24 | Discharge: 2016-12-25 | Disposition: A | Discharge status: Home / Self Care | Payer: Medicare HMO | Attending: Emergency Medicine | Admitting: Emergency Medicine

## 2016-12-24 DIAGNOSIS — I10 Essential (primary) hypertension: Secondary | ICD-10-CM | POA: Insufficient documentation

## 2016-12-24 DIAGNOSIS — R2689 Other abnormalities of gait and mobility: Secondary | ICD-10-CM | POA: Insufficient documentation

## 2016-12-24 DIAGNOSIS — W19XXXA Unspecified fall, initial encounter: Secondary | ICD-10-CM

## 2016-12-24 DIAGNOSIS — Z79899 Other long term (current) drug therapy: Secondary | ICD-10-CM | POA: Insufficient documentation

## 2016-12-24 DIAGNOSIS — Z7901 Long term (current) use of anticoagulants: Secondary | ICD-10-CM | POA: Insufficient documentation

## 2016-12-24 DIAGNOSIS — E039 Hypothyroidism, unspecified: Secondary | ICD-10-CM | POA: Insufficient documentation

## 2016-12-24 DIAGNOSIS — M6281 Muscle weakness (generalized): Secondary | ICD-10-CM | POA: Diagnosis present

## 2016-12-24 DIAGNOSIS — Z8673 Personal history of transient ischemic attack (TIA), and cerebral infarction without residual deficits: Secondary | ICD-10-CM | POA: Insufficient documentation

## 2016-12-24 LAB — BASIC METABOLIC PANEL
ANION GAP: 8 (ref 5–15)
BUN: 10 mg/dL (ref 6–20)
CHLORIDE: 109 mmol/L (ref 101–111)
CO2: 22 mmol/L (ref 22–32)
Calcium: 9.2 mg/dL (ref 8.9–10.3)
Creatinine, Ser: 0.9 mg/dL (ref 0.44–1.00)
GFR calc non Af Amer: >60 mL/min (ref >60)
GLUCOSE: 133 mg/dL — AB (ref 65–99)
Potassium: 4.2 mmol/L (ref 3.5–5.1)
Sodium: 139 mmol/L (ref 135–145)

## 2016-12-24 LAB — URINALYSIS, ROUTINE W REFLEX MICROSCOPIC
BACTERIA UA: NONE SEEN
Bilirubin Urine: NEGATIVE
Glucose, UA: NEGATIVE mg/dL
KETONES UR: NEGATIVE mg/dL
Leukocytes, UA: NEGATIVE
Nitrite: NEGATIVE
PROTEIN: NEGATIVE mg/dL
Specific Gravity, Urine: 1.023 (ref 1.005–1.030)
pH: 5 (ref 5.0–8.0)

## 2016-12-24 LAB — CBC
HCT: 42.2 % (ref 36.0–46.0)
HEMOGLOBIN: 13.5 g/dL (ref 12.0–15.0)
MCH: 31.2 pg (ref 26.0–34.0)
MCHC: 32 g/dL (ref 30.0–36.0)
MCV: 97.5 fL (ref 78.0–100.0)
Platelets: 159 10*3/uL (ref 150–400)
RBC: 4.33 MIL/uL (ref 3.87–5.11)
RDW: 13.9 % (ref 11.5–15.5)
WBC: 6.4 10*3/uL (ref 4.0–10.5)

## 2016-12-24 LAB — PROTIME-INR
INR: 2.9
Prothrombin Time: 31 seconds — ABNORMAL HIGH (ref 11.4–15.2)

## 2016-12-24 LAB — I-STAT TROPONIN, ED: TROPONIN I, POC: 0 ng/mL (ref 0.00–0.08)

## 2016-12-24 LAB — CBG MONITORING, ED: GLUCOSE-CAPILLARY: 110 mg/dL — AB (ref 65–99)

## 2016-12-24 NOTE — ED Provider Notes (Signed)
[facility]ENCY DEPT Provider Note   CSN: [id number] Arrival date & time: 12/24/16  1613     History   Chief Complaint Chief Complaint  Patient presents with  . Weakness  . Dizziness    HPI Kathryn Mathews is a 71 y.o. female.  Patient presents to the emergency department with chief complaint of recurrent falls and balance difficulty. She states that over the past several months she has seen a decline in her balance. She states that she is often staggering while trying to walk. She states that she fell earlier today after bending over to check the water temperature in a sink or tub. She denies any injury from the fall. She reports that her doctor has told her she may need a walker. She denies any history of stroke. She denies having any chest pain or shortness of breath or lightheadedness prior to falling. She did not pass out. She is anticoagulated with Coumadin for PE following pregnancy.  She denies any pain.  She denies any other associated symptoms.   The history is provided by the patient. No language interpreter was used.    Past Medical History:  Diagnosis Date  . Alopecia    well controlled per previous PCP - improved with biotin  . Anxiety   . Anxiety    when around ppl  . Aorto-iliac atherosclerosis (HCC) 05/2016   by xray  . Bipolar disorder (HCC)    no psych  . Chronic sinusitis   . Closed left humeral fracture 2010   closed left humeral neck fracture, also h/o L wrist fx  . Closed right humeral fracture 2011   coracoid nondisplaced, conservative therapy (Dr. Cleon Gustin)  . Depression   . Diverticulosis    by colonoscopy  . Falls    completed HHPT 10/2011, declined outpt PT  . GERD (gastroesophageal reflux disease)   . Glucose intolerance (impaired glucose tolerance)   . High cholesterol   . History of chicken pox   . History of DVT (deep vein thrombosis)    on chronic coumadin since 1973, h/o blood clots on coumadin 2011, added ASA QD, but due to fall risk  stopped and changed goal INR to 2.5-3.0  . HLD (hyperlipidemia)   . Humerus fracture   . Hyperactivity of bladder   . Hypothyroid    previously hyperthyroid  . Hypothyroidism   . Osteopenia 2010, 2016   DEXA 2010: T -1.3 L spine, -2.1 femoral neck. DEXA 2016: T -2.4 hip, T -0.5 spine; rpt 2 yrs  . PAD (peripheral artery disease) (HCC)   . Phlebitis    left leg stays swollen  . Positive ANA (antinuclear antibody)   . Pulmonary embolism [facility])     Patient Active Problem List   Diagnosis Date Noted  . Right hip pain 06/13/2016  . Aorto-iliac atherosclerosis (HCC) 05/14/2016  . TIA (transient ischemic attack) 10/27/2015  . Dysuria 05/24/2015  . Health maintenance examination 04/12/2015  . Insomnia 04/12/2015  . Chronic nausea 09/09/2014  . Anginal chest pain at rest [facility]) 09/09/2014  . Shortness of breath   . Abnormal nuclear stress test 09/04/2014  . Essential hypertension 09/02/2014  . Advanced care planning/counseling discussion 04/10/2014  . Recurrent falls 03/28/2014  . Encounter for therapeutic drug monitoring 08/22/2013  . Ventral hernia 09/07/2012  . Unspecified constipation 09/07/2012  . Abdominal discomfort 08/09/2012  . Spondylolisthesis, grade 3 07/15/2012  . Compression fracture of L1 lumbar vertebra (HCC) 07/15/2012  . Loss of taste 03/09/2012  . Imbalance  09/18/2011  . Dysphagia 07/21/2011  . Medicare annual wellness visit, subsequent 02/18/2011  . Vitamin D deficiency 02/18/2011  . Osteopenia   . Bipolar disorder (HCC)   . GERD (gastroesophageal reflux disease)   . HLD (hyperlipidemia)   . Hypothyroid   . History of DVT (deep vein thrombosis)   . PAD (peripheral artery disease) (HCC)     Past Surgical History:  Procedure Laterality Date  . ARTERIAL THROMBECTOMY  07/2009   with R mid popliteal artery angioplasty [balloon] (2011)[[[[  . CARDIAC CATHETERIZATION  08/2014   nl EF, small branch OM not amenable to PCI [name]([facility])  . CESAREAN SECTION    .  CESAREAN SECTION  1973  . CHOLECYSTECTOMY    . COLONOSCOPY  08/2008   int/ext hem, severe diverticulosis with evidence of itis, rec rpt 10 yrs  . hospitalization  01/2007   depression, ECT  . INGUINAL HERNIA REPAIR  1980s   x2 on right  . LEFT HEART CATHETERIZATION WITH CORONARY ANGIOGRAM N/A 09/05/2014   Procedure: LEFT HEART CATHETERIZATION WITH CORONARY ANGIOGRAM;  Surgeon: Marykay Lexavid W [facility], MD;  [facility];  Service: Cardiovascular;  Laterality: N/A;    OB History    Gravida Para Term Preterm AB Living   0 0 0 0 0     SAB TAB Ectopic Multiple Live Births   0 0 0           Home Medications    Prior to Admission medications   Medication Sig Start Date End Date Taking? Authorizing Provider  acetaminophen (TYLENOL) 500 MG tablet Take 500 mg by mouth every 8 (eight) hours as needed for moderate pain.   Yes [provider]  atorvastatin (LIPITOR) 20 MG tablet Take 1 tablet (20 mg total) by mouth daily. 11/28/15  Yes Anson FretAhern, Antonia B, MD  Biotin 1000 MCG tablet Take 1 tablet (1 mg total) by mouth 2 (two) times daily. 04/24/16  Yes Eustaquio BoydenGutierrez, Javier, MD  buPROPion [facility](WELLBUTRIN SR) 200 MG 12 hr tablet Take 1 tablet (200 mg total) by mouth 2 (two) times daily. 08/05/16  Yes Clapacs, Jackquline DenmarkJohn T, MD  clonazePAM (KLONOPIN) 1 MG tablet Take 1 tablet (1 mg total) by mouth 2 (two) times daily. 08/05/16  Yes Clapacs, Jackquline DenmarkJohn T, MD  diclofenac sodium (VOLTAREN) 1 % GEL Apply 1 application topically 2 (two) times daily as needed. Patient taking differently: Apply 1 application topically 2 (two) times daily as needed (pain).  06/13/16  Yes Eustaquio BoydenGutierrez, Javier, MD  Docusate Calcium (STOOL SOFTENER PO) Take 1 tablet by mouth daily as needed (constipation). Reported on 11/05/2015   Yes [provider]  lamoTRIgine (LAMICTAL) 100 MG tablet TAKE 1 TABLET 2 TIMES DAILY. 12/23/16  Yes Clapacs, Jackquline DenmarkJohn T, MD  levothyroxine (SYNTHROID, LEVOTHROID) 50 MCG tablet TAKE 1 TABLET EVERY DAY 07/15/16  Yes  Eustaquio BoydenGutierrez, Javier, MD  PRESCRIPTION MEDICATION Place 2 drops into both eyes daily as needed (dryness). Fresh coat   Yes [provider]  traMADol (ULTRAM) 50 MG tablet Take 1 tablet (50 mg total) by mouth 2 (two) times daily as needed. Patient taking differently: Take 50 mg by mouth 2 (two) times daily as needed for moderate pain.  07/28/16  Yes Eustaquio BoydenGutierrez, Javier, MD  warfarin (COUMADIN) 5 MG tablet TAKE DAILY AS DIRECTED BY COUMADIN CLINIC. Patient taking differently: Take 5 mg by mouth every evening.  05/09/16  Yes Eustaquio BoydenGutierrez, Javier, MD  ARIPiprazole (ABILIFY) 5 MG tablet Take 1 tablet (5 mg total) by mouth at  bedtime. Patient not taking: Reported on 12/24/2016 08/05/16   Clapacs, Jackquline Denmark, MD  Cholecalciferol (VITAMIN D) 2000 units CAPS Take 1 capsule (2,000 Units total) by mouth daily. Patient not taking: Reported on 12/24/2016 04/24/16   Eustaquio Boyden, MD    Family History Family History  Problem Relation Age of Onset  . Cancer Father   . Heart attack Mother 17  . Coronary artery disease Mother 66  . Hyperlipidemia Mother   . Heart disease Mother   . Hypertension Mother   . Breast cancer Maternal Aunt 54  . Coronary artery disease Maternal Uncle   . Coronary artery disease Maternal Grandmother 70  . Stroke Neg Hx   . Diabetes Neg Hx     Social History Social History  Substance Use Topics  . Smoking status: Never Smoker  . Smokeless tobacco: Never Used  . Alcohol use No     Allergies   Codeine; Acyclovir and related; and Hydrocodone   Review of Systems Review of Systems  All other systems reviewed and are negative.    Physical Exam Updated Vital Signs BP (!) 125/58   Pulse 62   Temp 98.3 F (36.8 C)   Resp 20   SpO2 98%   Physical Exam  Constitutional: She is oriented to person, place, and time. She appears well-developed and well-nourished.  HENT:  Head: Normocephalic and atraumatic.  Eyes: Conjunctivae and EOM are normal. Pupils are equal,  round, and reactive to light.  Neck: Normal range of motion. Neck supple.  Cardiovascular: Normal rate and regular rhythm.  Exam reveals no gallop and no friction rub.   No murmur heard. Pulmonary/Chest: Effort normal and breath sounds normal. No respiratory distress. She has no wheezes. She has no rales. She exhibits no tenderness.  Abdominal: Soft. Bowel sounds are normal. She exhibits no distension and no mass. There is no tenderness. There is no rebound and no guarding.  Musculoskeletal: Normal range of motion. She exhibits no edema or tenderness.  Neurological: She is alert and oriented to person, place, and time.  CN 3-12 intact Speech is clear Movements goal oriented Normal finger to nose No pronator drift Sensation and strength intact in all extremities  Skin: Skin is warm and dry.  Psychiatric: She has a normal mood and affect. Her behavior is normal. Judgment and thought content normal.  Nursing note and vitals reviewed.    ED Treatments / Results  Labs (all labs ordered are listed, but only abnormal results are displayed) Labs Reviewed  BASIC METABOLIC PANEL - Abnormal; Notable for the following:       Result Value   Glucose, Bld 133 (*)    All other components within normal limits  URINALYSIS, ROUTINE W REFLEX MICROSCOPIC - Abnormal; Notable for the following:    Hgb urine dipstick SMALL (*)    Squamous Epithelial / LPF 0-5 (*)    All other components within normal limits  CBG MONITORING, ED - Abnormal; Notable for the following:    Glucose-Capillary 110 (*)    All other components within normal limits  CBC  PROTIME-INR  I-STAT TROPOININ, ED    EKG  EKG Interpretation None       Radiology No results found.  Procedures Procedures (including critical care time)  Medications Ordered in ED Medications - No data to display   Initial Impression / Assessment and Plan / ED Course  I have reviewed the triage vital signs and the nursing notes.  Pertinent  labs & imaging results  that were available during my care of the patient were reviewed by me and considered in my medical decision making (see chart for details).     Patient with fall today. She states that she has had balance difficulties for the past several months, and they've been worsening. She reports that she staggers while she walks. She has been told by her PCP that she may need a walker. I have a lower suspicion for stroke, however I cannot rule this out entirely. Will check MRI brain. Laboratory workup an EKG is reassuring. Her EKG does show first-degree AV block, but I doubt this is contributing. If MRI is negative, believe the patient can be discharged home.  MRI is negative. Will discharge to home.  Recommend follow-up with PCP and encouraged patient to get a walker.  Discussed with Dr. Adela Lank, who agrees with the plan.  Final Clinical Impressions(s) / ED Diagnoses   Final diagnoses:  Balance problem  Fall, initial encounter    New Prescriptions New Prescriptions   No medications on file     Roxy Horseman, Cordelia Poche 12/25/16 0438    Roxy Horseman, PA-C 12/25/16 0441    Melene Plan, DO 12/26/16 1610

## 2016-12-24 NOTE — Telephone Encounter (Signed)
I spoke with pt and [facility] is [facility] way [facility] now. FYI to Dr Reece AgarG.

## 2016-12-24 NOTE — ED Triage Notes (Signed)
Pt reports having generalized weakness for extended amount of time, states it will temporarily improve but then return. Has difficulty ambulating when she is so weak and feels lightheaded. No acute distress is noted at this time.

## 2016-12-24 NOTE — Telephone Encounter (Signed)
[facility] - Client TELEPHONE ADVICE RECORD [facility]  Patient Name: Kathryn Mathews  DOB: 1946-07-04    Initial Comment Caller states she is having trouble walking and caller states when she bends over she almost falls over. Caller states she walks into walls.   Nurse Assessment  Nurse: Odis LusterBowers, RN, Bjorn Loserhonda Date/Time (Eastern Time): 12/24/2016 3:24:23 PM  Confirm and document reason for call. If symptomatic, describe symptoms. ---Caller states she is having trouble walking and caller states when she bends over she almost falls over. Caller states she walks into walls. She states she can't get he body to do what she wants it to.  Does the patient have any new or worsening symptoms? ---Yes  Will a triage be completed? ---Yes  Related visit to physician within the last 2 weeks? ---Yes  Does the PT have any chronic conditions? (i.e. diabetes, asthma, etc.) ---Yes  List chronic conditions. ---depression  Is this a behavioral health or substance abuse call? ---No     Guidelines    Guideline Title Affirmed Question Affirmed Notes  Weakness (Generalized) and Fatigue Patient sounds very sick or weak to the triager    Final Disposition User   Go to ED Now (or PCP triage) Odis LusterBowers, RN, Bjorn Loserhonda    Referrals  GO TO FACILITY UNDECIDED   Disagree/Comply: Danella Maiersomply

## 2016-12-25 ENCOUNTER — Emergency Department (HOSPITAL_COMMUNITY): Payer: Medicare HMO

## 2016-12-25 DIAGNOSIS — I10 Essential (primary) hypertension: Secondary | ICD-10-CM | POA: Diagnosis not present

## 2016-12-25 NOTE — ED Notes (Signed)
Called MRI, pt currently 3rd waiting to go back; patient's husband notified. Apologized for delay and repositioned patient in bed for a nap while she waits.

## 2016-12-25 NOTE — Telephone Encounter (Addendum)
Chronic dizziness issues. Reviewed ER note. Planned f/u in office.  plz call for f/u - encourage walker use - if ongoing dizziness offer appt in office.

## 2016-12-25 NOTE — ED Notes (Signed)
MRI called again, patient next in line for scan. Patient and husband updated.

## 2016-12-25 NOTE — ED Notes (Signed)
Patient transported to MRI via stretcher.

## 2016-12-25 NOTE — Telephone Encounter (Signed)
Lm on pts vm and advised to contact office and schedule f/u per Dr. Reece AgarG. Also advised to use walker as instructed

## 2016-12-30 ENCOUNTER — Ambulatory Visit (INDEPENDENT_AMBULATORY_CARE_PROVIDER_SITE_OTHER): Payer: Medicare HMO

## 2016-12-30 ENCOUNTER — Other Ambulatory Visit: Payer: Self-pay

## 2016-12-30 DIAGNOSIS — Z5181 Encounter for therapeutic drug level monitoring: Secondary | ICD-10-CM

## 2016-12-30 DIAGNOSIS — I739 Peripheral vascular disease, unspecified: Secondary | ICD-10-CM | POA: Diagnosis not present

## 2016-12-30 LAB — POCT INR: INR: 3.8

## 2016-12-30 MED ORDER — WARFARIN SODIUM 5 MG PO TABS: ORAL_TABLET | ORAL | 0 refills | Status: DC

## 2016-12-30 MED ORDER — ATORVASTATIN CALCIUM 20 MG PO TABS: 20.0000 mg | ORAL_TABLET | Freq: Every day | ORAL | 2 refills | Status: DC

## 2016-12-30 NOTE — Telephone Encounter (Signed)
While in for INR check today, husband requests refills on the following medications for patient:  1.  Warfarin 5mg  2.  Atorvastatin 20mg   He requests r/x's be [facility].  1. Warfarin 5mg  - Patient is compliant with coumadin management will refill X 3months per protocol.  2.  Atorvastatin 20mg  - LR #90, 1 year rf on 11/28/2015    Last OV health maint w/ Lipid Panel labs: 04/2016 and stable                                    - Will refill atorvastatin X 1 year.

## 2016-12-30 NOTE — Patient Instructions (Signed)
Pre visit review using our clinic review tool, if applicable. No additional management support is needed unless otherwise documented below in the visit note.  INR today 3.8  Patient reports recent ER visit after suffering 2 falls.  She denies any injuries or bleeding/bruising issues and otherwise states no changes in health, diet or medications.  Patient is to hold her coumadin today (6/19) and then restart taking decreased dose of 1 pill (5mg ) daily EXCEPT for 1/2 pill (2.5mg ) on Fridays only.  Recheck in 3 weeks.    Patient and husband educated on risks associated with supratherapeutic level especially with frequent fall history.  Both verbalize understanding of risks and all instructions given today.  Patient will go to ER if any further concerns develop.

## 2017-01-20 ENCOUNTER — Ambulatory Visit (INDEPENDENT_AMBULATORY_CARE_PROVIDER_SITE_OTHER): Payer: Medicare HMO

## 2017-01-20 DIAGNOSIS — Z5181 Encounter for therapeutic drug level monitoring: Secondary | ICD-10-CM

## 2017-01-20 DIAGNOSIS — I739 Peripheral vascular disease, unspecified: Secondary | ICD-10-CM | POA: Diagnosis not present

## 2017-01-20 LAB — POCT INR: INR: 1.7

## 2017-01-20 NOTE — Patient Instructions (Signed)
Pre visit review using our clinic review tool, if applicable. No additional management support is needed unless otherwise documented below in the visit note. 

## 2017-01-27 ENCOUNTER — Other Ambulatory Visit: Payer: Self-pay | Admitting: Psychiatry

## 2017-02-06 ENCOUNTER — Ambulatory Visit (INDEPENDENT_AMBULATORY_CARE_PROVIDER_SITE_OTHER): Payer: Medicare HMO

## 2017-02-06 DIAGNOSIS — I739 Peripheral vascular disease, unspecified: Secondary | ICD-10-CM | POA: Diagnosis not present

## 2017-02-06 DIAGNOSIS — Z5181 Encounter for therapeutic drug level monitoring: Secondary | ICD-10-CM | POA: Diagnosis not present

## 2017-02-06 LAB — POCT INR: INR: 4

## 2017-02-06 NOTE — Patient Instructions (Signed)
Pre visit review using our clinic review tool, if applicable. No additional management support is needed unless otherwise documented below in the visit note.   INR today 4.0  Patient continues to have unexplainable fluctuations in INR reading.  She and husband both report that she consumed excess wine in past 2 days and she usually never drinks.  She was attempting to help herself get to sleep, but states, "it did not work".  I reviewed with both of them the effects ETOH consumption has on warfarin and asked if she was planning to continue to drink on occasion so I could plan dosing accordingly.  Patient reports that she does not like to drink and this was a one time only try and see which did not work.  So, she will not be drinking again.  Otherwise they deny any changes in meds, diet or general health.  I do have concerns that patient may be mis-managing in some way as it continues to be difficult to keep her in normal range, with frequent unexplainable fluctuations.  Patient is to hold her coumadin (7/27) today and tomorrow (7/28) and then resume prior dosing of 1 pill daily ( 5mg ) EXCEPT for 1/2 pill (2.5mg ) on Fridays.  Recheck in 10 days.  Plans not to decrease further at this point due to brief alcohol use which will no longer be a problem and her recent subtherapeutic reading on this dosing schedule in recent checks.  Patient and husband verbalize understanding of all instructions given today and risks associated with supratherapeutic level.  They will go to ER if any concerns develop.

## 2017-02-17 ENCOUNTER — Ambulatory Visit (INDEPENDENT_AMBULATORY_CARE_PROVIDER_SITE_OTHER): Payer: Medicare HMO

## 2017-02-17 DIAGNOSIS — Z5181 Encounter for therapeutic drug level monitoring: Secondary | ICD-10-CM

## 2017-02-17 DIAGNOSIS — I739 Peripheral vascular disease, unspecified: Secondary | ICD-10-CM

## 2017-02-17 LAB — POCT INR: INR: 2.3

## 2017-02-17 NOTE — Patient Instructions (Signed)
Pre visit review using our clinic review tool, if applicable. No additional management support is needed unless otherwise documented below in the visit note. 

## 2017-03-17 ENCOUNTER — Ambulatory Visit (INDEPENDENT_AMBULATORY_CARE_PROVIDER_SITE_OTHER): Payer: Medicare HMO

## 2017-03-17 DIAGNOSIS — I739 Peripheral vascular disease, unspecified: Secondary | ICD-10-CM

## 2017-03-17 DIAGNOSIS — Z5181 Encounter for therapeutic drug level monitoring: Secondary | ICD-10-CM

## 2017-03-17 LAB — POCT INR: INR: 1.8

## 2017-03-17 NOTE — Patient Instructions (Signed)
Pre visit review using our clinic review tool, if applicable. No additional management support is needed unless otherwise documented below in the visit note. 

## 2017-03-31 ENCOUNTER — Ambulatory Visit (INDEPENDENT_AMBULATORY_CARE_PROVIDER_SITE_OTHER): Payer: Medicare HMO | Admitting: Psychiatry

## 2017-03-31 ENCOUNTER — Encounter: Payer: Self-pay | Admitting: Psychiatry

## 2017-03-31 VITALS — BP 128/84 | Temp 98.5°F | Wt 145.2 lb

## 2017-03-31 DIAGNOSIS — F411 Generalized anxiety disorder: Secondary | ICD-10-CM | POA: Diagnosis not present

## 2017-03-31 MED ORDER — BUPROPION HCL ER (SR) 200 MG PO TB12: 200.0000 mg | ORAL_TABLET | Freq: Two times a day (BID) | ORAL | 1 refills | Status: DC

## 2017-03-31 MED ORDER — LAMOTRIGINE 100 MG PO TABS: ORAL_TABLET | ORAL | 1 refills | Status: DC

## 2017-03-31 MED ORDER — CLONAZEPAM 1 MG PO TABS: 1.0000 mg | ORAL_TABLET | Freq: Two times a day (BID) | ORAL | 1 refills | Status: DC

## 2017-03-31 MED ORDER — TRAZODONE HCL 100 MG PO TABS: 100.0000 mg | ORAL_TABLET | Freq: Every day | ORAL | 1 refills | Status: DC

## 2017-03-31 MED ORDER — ARIPIPRAZOLE 5 MG PO TABS: 5.0000 mg | ORAL_TABLET | Freq: Every day | ORAL | 1 refills | Status: DC

## 2017-03-31 NOTE — Progress Notes (Signed)
Follow-up for 71 year old woman with chronic anxiety and irritability. Her mood recently has been more irritable with her husband. Especially in the morning. Continues to feel the same general negativity towards life and her family. No suicidal thoughts no psychotic symptoms. Patient also complains that she is having more trouble sleeping than usual.  Neatly dressed and groomed woman looks her stated age. Good eye contact and normal psychomotor activity. Speech normal rate tone and volume. Blunted mood. No suicidal thoughts.  Renew current medications. I have a feeling she may not be taking the Abilify although she claims that she was. I will refill everything however and also add trazodone 100 mg at night for her sleep and anxiety. Follow-up 6 months.

## 2017-04-14 ENCOUNTER — Ambulatory Visit (INDEPENDENT_AMBULATORY_CARE_PROVIDER_SITE_OTHER): Payer: Medicare HMO

## 2017-04-14 DIAGNOSIS — Z5181 Encounter for therapeutic drug level monitoring: Secondary | ICD-10-CM

## 2017-04-14 DIAGNOSIS — I739 Peripheral vascular disease, unspecified: Secondary | ICD-10-CM | POA: Diagnosis not present

## 2017-04-14 LAB — POCT INR: INR: 1.4

## 2017-04-14 NOTE — Patient Instructions (Signed)
Pre visit review using our clinic review tool, if applicable. No additional management support is needed unless otherwise documented below in the visit note. 

## 2017-04-21 ENCOUNTER — Encounter (HOSPITAL_COMMUNITY): Payer: Self-pay | Admitting: Emergency Medicine

## 2017-04-21 ENCOUNTER — Other Ambulatory Visit: Payer: Self-pay | Admitting: Family Medicine

## 2017-04-21 ENCOUNTER — Observation Stay (HOSPITAL_COMMUNITY)
Admission: EM | Admit: 2017-04-21 | Discharge: 2017-04-23 | Disposition: A | Discharge status: Home / Self Care | Payer: Medicare HMO | Attending: Internal Medicine | Admitting: Internal Medicine

## 2017-04-21 ENCOUNTER — Telehealth: Payer: Self-pay

## 2017-04-21 DIAGNOSIS — K219 Gastro-esophageal reflux disease without esophagitis: Secondary | ICD-10-CM | POA: Diagnosis not present

## 2017-04-21 DIAGNOSIS — Z7901 Long term (current) use of anticoagulants: Secondary | ICD-10-CM | POA: Insufficient documentation

## 2017-04-21 DIAGNOSIS — M79659 Pain in unspecified thigh: Secondary | ICD-10-CM | POA: Diagnosis present

## 2017-04-21 DIAGNOSIS — S0990XA Unspecified injury of head, initial encounter: Secondary | ICD-10-CM | POA: Diagnosis not present

## 2017-04-21 DIAGNOSIS — W19XXXA Unspecified fall, initial encounter: Secondary | ICD-10-CM

## 2017-04-21 DIAGNOSIS — E785 Hyperlipidemia, unspecified: Secondary | ICD-10-CM | POA: Diagnosis not present

## 2017-04-21 DIAGNOSIS — F5101 Primary insomnia: Secondary | ICD-10-CM | POA: Diagnosis not present

## 2017-04-21 DIAGNOSIS — I739 Peripheral vascular disease, unspecified: Secondary | ICD-10-CM | POA: Diagnosis not present

## 2017-04-21 DIAGNOSIS — E039 Hypothyroidism, unspecified: Secondary | ICD-10-CM | POA: Diagnosis present

## 2017-04-21 DIAGNOSIS — Z888 Allergy status to other drugs, medicaments and biological substances status: Secondary | ICD-10-CM | POA: Diagnosis not present

## 2017-04-21 DIAGNOSIS — Z86718 Personal history of other venous thrombosis and embolism: Secondary | ICD-10-CM | POA: Diagnosis not present

## 2017-04-21 DIAGNOSIS — Z9181 History of falling: Secondary | ICD-10-CM | POA: Insufficient documentation

## 2017-04-21 DIAGNOSIS — F319 Bipolar disorder, unspecified: Secondary | ICD-10-CM | POA: Diagnosis present

## 2017-04-21 DIAGNOSIS — Z791 Long term (current) use of non-steroidal anti-inflammatories (NSAID): Secondary | ICD-10-CM | POA: Insufficient documentation

## 2017-04-21 DIAGNOSIS — G8929 Other chronic pain: Secondary | ICD-10-CM | POA: Diagnosis not present

## 2017-04-21 DIAGNOSIS — R269 Unspecified abnormalities of gait and mobility: Principal | ICD-10-CM

## 2017-04-21 DIAGNOSIS — I10 Essential (primary) hypertension: Secondary | ICD-10-CM | POA: Diagnosis not present

## 2017-04-21 DIAGNOSIS — Z8673 Personal history of transient ischemic attack (TIA), and cerebral infarction without residual deficits: Secondary | ICD-10-CM | POA: Insufficient documentation

## 2017-04-21 DIAGNOSIS — R2689 Other abnormalities of gait and mobility: Secondary | ICD-10-CM

## 2017-04-21 DIAGNOSIS — F419 Anxiety disorder, unspecified: Secondary | ICD-10-CM | POA: Diagnosis not present

## 2017-04-21 DIAGNOSIS — Z79899 Other long term (current) drug therapy: Secondary | ICD-10-CM | POA: Diagnosis not present

## 2017-04-21 DIAGNOSIS — R296 Repeated falls: Secondary | ICD-10-CM

## 2017-04-21 DIAGNOSIS — G4700 Insomnia, unspecified: Secondary | ICD-10-CM | POA: Diagnosis present

## 2017-04-21 DIAGNOSIS — E559 Vitamin D deficiency, unspecified: Secondary | ICD-10-CM

## 2017-04-21 LAB — URINALYSIS, ROUTINE W REFLEX MICROSCOPIC
BACTERIA UA: NONE SEEN
Bilirubin Urine: NEGATIVE
GLUCOSE, UA: NEGATIVE mg/dL
HGB URINE DIPSTICK: NEGATIVE
Ketones, ur: NEGATIVE mg/dL
NITRITE: NEGATIVE
PH: 5 (ref 5.0–8.0)
PROTEIN: NEGATIVE mg/dL
SPECIFIC GRAVITY, URINE: 1.02 (ref 1.005–1.030)

## 2017-04-21 LAB — CBC WITH DIFFERENTIAL/PLATELET
BASOS ABS: 0 10*3/uL (ref 0.0–0.1)
BASOS PCT: 0 %
Eosinophils Absolute: 0.2 10*3/uL (ref 0.0–0.7)
Eosinophils Relative: 3 %
HCT: 39.8 % (ref 36.0–46.0)
Hemoglobin: 12.9 g/dL (ref 12.0–15.0)
Lymphocytes Relative: 24 %
Lymphs Abs: 1.4 10*3/uL (ref 0.7–4.0)
MCH: 31.5 pg (ref 26.0–34.0)
MCHC: 32.4 g/dL (ref 30.0–36.0)
MCV: 97.1 fL (ref 78.0–100.0)
MONO ABS: 0.4 10*3/uL (ref 0.1–1.0)
MONOS PCT: 6 %
Neutro Abs: 3.8 10*3/uL (ref 1.7–7.7)
Neutrophils Relative %: 67 %
PLATELETS: 145 10*3/uL — AB (ref 150–400)
RBC: 4.1 MIL/uL (ref 3.87–5.11)
RDW: 13.3 % (ref 11.5–15.5)
WBC: 5.8 10*3/uL (ref 4.0–10.5)

## 2017-04-21 LAB — COMPREHENSIVE METABOLIC PANEL
ALBUMIN: 3.6 g/dL (ref 3.5–5.0)
ALT: 11 U/L — ABNORMAL LOW (ref 14–54)
ANION GAP: 8 (ref 5–15)
AST: 35 U/L (ref 15–41)
Alkaline Phosphatase: 55 U/L (ref 38–126)
BUN: 10 mg/dL (ref 6–20)
CHLORIDE: 102 mmol/L (ref 101–111)
CO2: 26 mmol/L (ref 22–32)
Calcium: 9.1 mg/dL (ref 8.9–10.3)
Creatinine, Ser: 0.96 mg/dL (ref 0.44–1.00)
GFR calc Af Amer: >60 mL/min (ref >60)
GFR, EST NON AFRICAN AMERICAN: 58 mL/min — AB (ref >60)
Glucose, Bld: 98 mg/dL (ref 65–99)
Potassium: 4.2 mmol/L (ref 3.5–5.1)
Sodium: 136 mmol/L (ref 135–145)
Total Bilirubin: 0.7 mg/dL (ref 0.3–1.2)
Total Protein: 6.2 g/dL — ABNORMAL LOW (ref 6.5–8.1)

## 2017-04-21 NOTE — Telephone Encounter (Signed)
PLEASE NOTE: All timestamps contained within this report are represented as [place] Standard Time. CONFIDENTIALTY NOTICE: This fax transmission is intended only for the addressee. It contains information that is legally privileged, confidential or otherwise protected from use or disclosure. If you are not the intended recipient, you are strictly prohibited from reviewing, disclosing, copying using or disseminating any of this information or taking any action in reliance on or regarding this information. If you have received this fax in error, please notify us immediately by telephone so that we can arrange for its return to [place]. Phone: [phone], Toll-Free: [phone], Fax: [phone] Page: 1 of 1 Call Id: [id number] [facility] Day - Client Nonclinical Telephone Record [facility] Medical Call Center Client [facility] Day - Client Client Site [facility] - Day Physician Eustaquio Boyden - MD Contact Type Call Who Is Calling Patient / Member / Family / Caregiver Caller Name Brynna Dobos Caller Phone Number [phone] Patient Name Kathryn Mathews Call Type Message Only Information Provided Reason for Call Request to Schedule Office Appointment Initial Comment Caller has problem with walking, problem standing up. Call Closed By: Willeen Niece Transaction Date/Time: 04/21/2017 3:46:57 PM (ET)

## 2017-04-21 NOTE — Telephone Encounter (Signed)
This team health note did not come to my desktop. Found in TH portal. I spoke with pt and she is falling into walls and can barely move lt leg.Symptoms began on 04/20/17. Words are difficult to understand at times; speech slurred; then other times speech is clear. Pt is at home alone now but resting in chair; pt does not want ambulance called; pt said she  will be OK until her husband gets home.I spoke with pts husband and he will go home now and take pt to [facility] ED.

## 2017-04-21 NOTE — ED Notes (Signed)
ED Provider at bedside. 

## 2017-04-21 NOTE — ED Triage Notes (Signed)
Pt st's she has been having trouble walking for approx 2 weeks.  St;'s legs are very painful when she stands.  Pt st's she fell 3 days ago and hit her head.  Pt is on Coumadin.  Husband also st's when she fell she could not get up.  Pt alert and oriented x's 3.

## 2017-04-21 NOTE — Telephone Encounter (Signed)
Agree. Thanks

## 2017-04-22 ENCOUNTER — Inpatient Hospital Stay (HOSPITAL_COMMUNITY): Payer: Medicare HMO

## 2017-04-22 ENCOUNTER — Emergency Department (HOSPITAL_COMMUNITY): Payer: Medicare HMO

## 2017-04-22 DIAGNOSIS — F3177 Bipolar disorder, in partial remission, most recent episode mixed: Secondary | ICD-10-CM

## 2017-04-22 DIAGNOSIS — R2689 Other abnormalities of gait and mobility: Secondary | ICD-10-CM

## 2017-04-22 DIAGNOSIS — M48061 Spinal stenosis, lumbar region without neurogenic claudication: Secondary | ICD-10-CM | POA: Diagnosis not present

## 2017-04-22 DIAGNOSIS — R296 Repeated falls: Secondary | ICD-10-CM

## 2017-04-22 DIAGNOSIS — G4700 Insomnia, unspecified: Secondary | ICD-10-CM | POA: Diagnosis not present

## 2017-04-22 DIAGNOSIS — S0990XA Unspecified injury of head, initial encounter: Secondary | ICD-10-CM | POA: Diagnosis not present

## 2017-04-22 DIAGNOSIS — R269 Unspecified abnormalities of gait and mobility: Secondary | ICD-10-CM

## 2017-04-22 DIAGNOSIS — E039 Hypothyroidism, unspecified: Secondary | ICD-10-CM | POA: Diagnosis not present

## 2017-04-22 DIAGNOSIS — M79652 Pain in left thigh: Secondary | ICD-10-CM | POA: Diagnosis not present

## 2017-04-22 DIAGNOSIS — Z86718 Personal history of other venous thrombosis and embolism: Secondary | ICD-10-CM | POA: Diagnosis not present

## 2017-04-22 DIAGNOSIS — S79922A Unspecified injury of left thigh, initial encounter: Secondary | ICD-10-CM | POA: Diagnosis not present

## 2017-04-22 LAB — CBC
HEMATOCRIT: 38.6 % (ref 36.0–46.0)
HEMOGLOBIN: 12.7 g/dL (ref 12.0–15.0)
MCH: 32 pg (ref 26.0–34.0)
MCHC: 32.9 g/dL (ref 30.0–36.0)
MCV: 97.2 fL (ref 78.0–100.0)
PLATELETS: 127 10*3/uL — AB (ref 150–400)
RBC: 3.97 MIL/uL (ref 3.87–5.11)
RDW: 13.2 % (ref 11.5–15.5)
WBC: 5.8 10*3/uL (ref 4.0–10.5)

## 2017-04-22 LAB — PROTIME-INR
INR: 1.59
PROTHROMBIN TIME: 18.8 s — AB (ref 11.4–15.2)

## 2017-04-22 LAB — RAPID URINE DRUG SCREEN, HOSP PERFORMED
Amphetamines: NOT DETECTED
BENZODIAZEPINES: POSITIVE — AB
Barbiturates: NOT DETECTED
COCAINE: NOT DETECTED
Opiates: NOT DETECTED
Tetrahydrocannabinol: NOT DETECTED

## 2017-04-22 LAB — VITAMIN B12: Vitamin B-12: 688 pg/mL (ref 180–914)

## 2017-04-22 LAB — TSH: TSH: 5.153 u[IU]/mL — AB (ref 0.350–4.500)

## 2017-04-22 LAB — FOLATE: FOLATE: 18.1 ng/mL (ref >5.9)

## 2017-04-22 MED ORDER — SODIUM CHLORIDE 0.9 % IV SOLN
INTRAVENOUS | Status: DC
  Administered 2017-04-22: 17:00:00 via INTRAVENOUS

## 2017-04-22 MED ORDER — DICLOFENAC SODIUM 1 % TD GEL: 1.0000 "application " | Freq: Two times a day (BID) | TRANSDERMAL | Status: DC | PRN

## 2017-04-22 MED ORDER — LORAZEPAM 2 MG/ML IJ SOLN
0.5000 mg | Freq: Once | INTRAMUSCULAR | Status: AC | PRN
  Administered 2017-04-22: 0.5 mg via INTRAVENOUS
  Filled 2017-04-22: qty 1

## 2017-04-22 MED ORDER — DOCUSATE SODIUM 100 MG PO CAPS: 100.0000 mg | ORAL_CAPSULE | Freq: Every day | ORAL | Status: DC | PRN

## 2017-04-22 MED ORDER — CLONAZEPAM 0.5 MG PO TABS
0.5000 mg | ORAL_TABLET | Freq: Two times a day (BID) | ORAL | Status: DC
  Administered 2017-04-22 – 2017-04-23 (×2): 0.5 mg via ORAL
  Filled 2017-04-22 (×2): qty 1

## 2017-04-22 MED ORDER — ACETAMINOPHEN 325 MG PO TABS: 650.0000 mg | ORAL_TABLET | Freq: Four times a day (QID) | ORAL | Status: DC | PRN

## 2017-04-22 MED ORDER — BUPROPION HCL ER (SR) 100 MG PO TB12
200.0000 mg | ORAL_TABLET | Freq: Two times a day (BID) | ORAL | Status: DC
  Administered 2017-04-22 – 2017-04-23 (×3): 200 mg via ORAL
  Filled 2017-04-22 (×3): qty 2

## 2017-04-22 MED ORDER — ONDANSETRON HCL 4 MG PO TABS: 4.0000 mg | ORAL_TABLET | Freq: Four times a day (QID) | ORAL | Status: DC | PRN

## 2017-04-22 MED ORDER — WARFARIN SODIUM 7.5 MG PO TABS
7.5000 mg | ORAL_TABLET | Freq: Once | ORAL | Status: AC
  Administered 2017-04-22: 7.5 mg via ORAL
  Filled 2017-04-22: qty 1

## 2017-04-22 MED ORDER — LAMOTRIGINE 100 MG PO TABS
100.0000 mg | ORAL_TABLET | Freq: Two times a day (BID) | ORAL | Status: DC
  Administered 2017-04-22 – 2017-04-23 (×3): 100 mg via ORAL
  Filled 2017-04-22 (×3): qty 1

## 2017-04-22 MED ORDER — GADOBENATE DIMEGLUMINE 529 MG/ML IV SOLN
14.0000 mL | Freq: Once | INTRAVENOUS | Status: AC | PRN
  Administered 2017-04-22: 14 mL via INTRAVENOUS

## 2017-04-22 MED ORDER — WARFARIN - PHARMACIST DOSING INPATIENT
Freq: Every day | Status: DC
  Administered 2017-04-22: 17:00:00

## 2017-04-22 MED ORDER — SODIUM CHLORIDE 0.9 % IV BOLUS (SEPSIS)
1000.0000 mL | Freq: Once | INTRAVENOUS | Status: AC
  Administered 2017-04-22: 1000 mL via INTRAVENOUS

## 2017-04-22 MED ORDER — ONDANSETRON HCL 4 MG/2ML IJ SOLN: 4.0000 mg | Freq: Four times a day (QID) | INTRAMUSCULAR | Status: DC | PRN

## 2017-04-22 MED ORDER — LEVOTHYROXINE SODIUM 50 MCG PO TABS
50.0000 ug | ORAL_TABLET | Freq: Every day | ORAL | Status: DC
  Administered 2017-04-22 – 2017-04-23 (×2): 50 ug via ORAL
  Filled 2017-04-22 (×2): qty 1

## 2017-04-22 MED ORDER — ACETAMINOPHEN 650 MG RE SUPP: 650.0000 mg | Freq: Four times a day (QID) | RECTAL | Status: DC | PRN

## 2017-04-22 MED ORDER — ATORVASTATIN CALCIUM 20 MG PO TABS
20.0000 mg | ORAL_TABLET | Freq: Every day | ORAL | Status: DC
  Administered 2017-04-22 – 2017-04-23 (×2): 20 mg via ORAL
  Filled 2017-04-22 (×2): qty 1

## 2017-04-22 MED ORDER — CLONAZEPAM 1 MG PO TABS
1.0000 mg | ORAL_TABLET | Freq: Two times a day (BID) | ORAL | Status: DC
  Administered 2017-04-22: 1 mg via ORAL
  Filled 2017-04-22 (×2): qty 1

## 2017-04-22 NOTE — ED Provider Notes (Signed)
[facility]-EMERGENCY DEPT Provider Note   CSN: [id number] Arrival date & time: 04/21/17  1944     History   Chief Complaint Chief Complaint  Patient presents with  . Fall    HPI Kathryn Mathews is a 71 y.o. female.  The history is provided by the patient and the spouse.  Fall  This is a new problem. The current episode started 2 days ago. The problem has not changed since onset.Pertinent negatives include no chest pain, no abdominal pain and no headaches. The symptoms are aggravated by walking. The symptoms are relieved by rest. She has tried nothing for the symptoms.   Patient with h/o bipolar, DVT on coumadin presents for difficulty ambulating and falls Per husband, this worsened on 10/5 and since then she needs assistance with any ambulation She fell two days ago "crashing her head" when she fell but denies LOC No new injuries, but reports "muscles are sore"  No cp/sob No specific dizziness just reports difficulty walking No new weakness She thinks it may be due to starting abilify/trazodone last month  Past Medical History:  Diagnosis Date  . Alopecia    well controlled per previous PCP - improved with biotin  . Anxiety   . Anxiety    when around ppl  . Aorto-iliac atherosclerosis (HCC) 05/2016   by xray  . Bipolar disorder (HCC)    no psych  . Chronic sinusitis   . Closed left humeral fracture 2010   closed left humeral neck fracture, also h/o L wrist fx  . Closed right humeral fracture 2011   coracoid nondisplaced, conservative therapy (Dr. Cleon Gustin)  . Depression   . Diverticulosis    by colonoscopy  . Falls    completed HHPT 10/2011, declined outpt PT  . GERD (gastroesophageal reflux disease)   . Glucose intolerance (impaired glucose tolerance)   . High cholesterol   . History of chicken pox   . 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 HLD (hyperlipidemia)   . Humerus fracture   . Hyperactivity of bladder   . Hypothyroid    previously hyperthyroid  . Hypothyroidism   . Osteopenia 2010, 2016   DEXA 2010: T -1.3 L spine, -2.1 femoral neck. DEXA 2016: T -2.4 hip, T -0.5 spine; rpt 2 yrs  . PAD (peripheral artery disease) (HCC)   . Phlebitis    left leg stays swollen  . Positive ANA (antinuclear antibody)   . Pulmonary embolism [facility])     Patient Active Problem List   Diagnosis Date Noted  . Right hip pain 06/13/2016  . Aorto-iliac atherosclerosis (HCC) 05/14/2016  . TIA (transient ischemic attack) 10/27/2015  . Dysuria 05/24/2015  . Health maintenance examination 04/12/2015  . Insomnia 04/12/2015  . Chronic nausea 09/09/2014  . Anginal chest pain at rest [facility]) 09/09/2014  . Shortness of breath   . Abnormal nuclear stress test 09/04/2014  . Essential hypertension 09/02/2014  . Advanced care planning/counseling discussion 04/10/2014  . Recurrent falls 03/28/2014  . Encounter for therapeutic drug monitoring 08/22/2013  . Ventral hernia 09/07/2012  . Unspecified constipation 09/07/2012  . Abdominal discomfort 08/09/2012  . Spondylolisthesis, grade 3 07/15/2012  . Compression fracture of L1 lumbar vertebra (HCC) 07/15/2012  . Loss of taste 03/09/2012  . Imbalance 09/18/2011  . Dysphagia 07/21/2011  . Medicare annual wellness  visit, subsequent 02/18/2011  . Vitamin D deficiency 02/18/2011  . Osteopenia   . Bipolar disorder (HCC)   . GERD (gastroesophageal reflux disease)   . HLD (hyperlipidemia)   . Hypothyroid   . History of DVT (deep vein thrombosis)   . PAD (peripheral artery disease) (HCC)     Past Surgical History:  Procedure Laterality Date  . ARTERIAL THROMBECTOMY  07/2009   with R mid popliteal artery angioplasty [balloon] (2011)[[[[  . CARDIAC CATHETERIZATION  08/2014   nl EF, small branch OM not amenable to PCI [name])  . CESAREAN SECTION    . CESAREAN SECTION  1973  .  CHOLECYSTECTOMY    . COLONOSCOPY  08/2008   int/ext hem, severe diverticulosis with evidence of itis, rec rpt 10 yrs  . hospitalization  01/2007   depression, ECT  . INGUINAL HERNIA REPAIR  1980s   x2 on right  . LEFT HEART CATHETERIZATION WITH CORONARY ANGIOGRAM N/A 09/05/2014   Procedure: LEFT HEART CATHETERIZATION WITH CORONARY ANGIOGRAM;  Surgeon: Marykay Lex, MD;  Location: [facility] CATH LAB;  Service: Cardiovascular;  Laterality: N/A;    OB History    Gravida Para Term Preterm AB Living   0 0 0 0 0     SAB TAB Ectopic Multiple Live Births   0 0 0           Home Medications    Prior to Admission medications   Medication Sig Start Date End Date Taking? Authorizing Provider  acetaminophen (TYLENOL) 500 MG tablet Take 500 mg by mouth every 8 (eight) hours as needed for moderate pain.    [provider]  ARIPiprazole (ABILIFY) 5 MG tablet Take 1 tablet (5 mg total) by mouth at bedtime. 03/31/17   Clapacs, Jackquline Denmark, MD  atorvastatin (LIPITOR) 20 MG tablet Take 1 tablet (20 mg total) by mouth daily. 12/30/16   Eustaquio Boyden, MD  Biotin 1000 MCG tablet Take 1 tablet (1 mg total) by mouth 2 (two) times daily. 04/24/16   Eustaquio Boyden, MD  buPROPion [facility] SR) 200 MG 12 hr tablet Take 1 tablet (200 mg total) by mouth 2 (two) times daily. 03/31/17   Clapacs, Jackquline Denmark, MD  Cholecalciferol (VITAMIN D) 2000 units CAPS Take 1 capsule (2,000 Units total) by mouth daily. 04/24/16   Eustaquio Boyden, MD  clonazePAM (KLONOPIN) 1 MG tablet Take 1 tablet (1 mg total) by mouth 2 (two) times daily. 03/31/17   Clapacs, Jackquline Denmark, MD  diclofenac sodium (VOLTAREN) 1 % GEL Apply 1 application topically 2 (two) times daily as needed. Patient taking differently: Apply 1 application topically 2 (two) times daily as needed (pain).  06/13/16   Eustaquio Boyden, MD  Docusate Calcium (STOOL SOFTENER PO) Take 1 tablet by mouth daily as needed (constipation). Reported on 11/05/2015    [provider]  lamoTRIgine (LAMICTAL) 100 MG tablet TAKE 1 TABLET 2 TIMES DAILY. 03/31/17   Clapacs, Jackquline Denmark, MD  levothyroxine (SYNTHROID, LEVOTHROID) 50 MCG tablet TAKE 1 TABLET EVERY DAY 07/15/16   Eustaquio Boyden, MD  PRESCRIPTION MEDICATION Place 2 drops into both eyes daily as needed (dryness). Fresh coat    [provider]  traMADol (ULTRAM) 50 MG tablet Take 1 tablet (50 mg total) by mouth 2 (two) times daily as needed. Patient taking differently: Take 50 mg by mouth 2 (two) times daily as needed for moderate pain.  07/28/16   Eustaquio Boyden, MD  traZODone (DESYREL) 100 MG tablet Take 1 tablet (100 mg  total) by mouth at bedtime. 03/31/17   Clapacs, Jackquline Denmark, MD  warfarin (COUMADIN) 5 MG tablet TAKE DAILY AS DIRECTED BY COUMADIN CLINIC. 12/30/16   Eustaquio Boyden, MD    Family History Family History  Problem Relation Age of Onset  . Cancer Father   . Heart attack Mother 42  . Coronary artery disease Mother 65  . Hyperlipidemia Mother   . Heart disease Mother   . Hypertension Mother   . Breast cancer Maternal Aunt 54  . Coronary artery disease Maternal Uncle   . Coronary artery disease Maternal Grandmother 70  . Stroke Neg Hx   . Diabetes Neg Hx     Social History Social History  Substance Use Topics  . Smoking status: Never Smoker  . Smokeless tobacco: Never Used  . Alcohol use No     Allergies   Codeine; Acyclovir and related; and Hydrocodone   Review of Systems Review of Systems  Constitutional: Negative for fever.  Cardiovascular: Negative for chest pain.  Gastrointestinal: Negative for abdominal pain.  Musculoskeletal: Positive for arthralgias. Negative for back pain and neck pain.  Neurological: Negative for headaches.  All other systems reviewed and are negative.    Physical Exam Updated Vital Signs BP (!) 102/51   Pulse 68   Temp 99 F (37.2 C) (Oral)   Resp 19   Ht 1.41 m (4' 7.5")   Wt 65.8 kg (145 lb)   SpO2 100%   BMI 33.10 kg/m   Physical  Exam CONSTITUTIONAL: Elderly, no distress HEAD: Normocephalic/atraumatic EYES: EOMI/PERRL ENMT: Mucous membranes moist NECK: supple no meningeal signs SPINE/BACK:entire spine nontender, kyphotic spine CV: S1/S2 noted,murmur noted LUNGS: Lungs are clear to auscultation bilaterally, no apparent distress ABDOMEN: soft, nontender, no rebound or guarding, bowel sounds noted throughout abdomen GU:no cva tenderness NEURO: Pt is awake/alert/appropriate, moves all extremitiesx4.  No facial droop.  No past pointing.  She can only ambulate minimally with shuffling gait, she can only take 1-2 steps and must use assistance EXTREMITIES: pulses normal/equal, full ROM, no deformities noted to extremities SKIN: warm, color normal PSYCH: no abnormalities of mood noted, alert and oriented to situation   ED Treatments / Results  Labs (all labs ordered are listed, but only abnormal results are displayed) Labs Reviewed  CBC WITH DIFFERENTIAL/PLATELET - Abnormal; Notable for the following:       Result Value   Platelets 145 (*)    All other components within normal limits  COMPREHENSIVE METABOLIC PANEL - Abnormal; Notable for the following:    Total Protein 6.2 (*)    ALT 11 (*)    GFR calc non Af Amer 58 (*)    All other components within normal limits  URINALYSIS, ROUTINE W REFLEX MICROSCOPIC - Abnormal; Notable for the following:    Leukocytes, UA TRACE (*)    Squamous Epithelial / LPF 0-5 (*)    All other components within normal limits  PROTIME-INR - Abnormal; Notable for the following:    Prothrombin Time 18.8 (*)    All other components within normal limits  RAPID URINE DRUG SCREEN, HOSP PERFORMED - Abnormal; Notable for the following:    Benzodiazepines POSITIVE (*)    All other components within normal limits    EKG  EKG Interpretation  Date/Time:  Wednesday April 22 2017 00:55:14 EDT Ventricular Rate:  66 PR Interval:    QRS Duration: 92 QT Interval:  412 QTC  Calculation: 432 R Axis:   -21 Text Interpretation:  Sinus rhythm Prolonged  PR interval Low voltage, precordial leads Left ventricular hypertrophy Confirmed by Zadie Rhine ([id number]) on 04/22/2017 1:08:33 AM       Radiology Ct Head Wo Contrast  Result Date: 04/22/2017 CLINICAL DATA:  Status post fall, with concern for head injury. Initial encounter. EXAM: CT HEAD WITHOUT CONTRAST TECHNIQUE: Contiguous axial images were obtained from the base of the skull through the vertex without intravenous contrast. COMPARISON:  CT of the head performed 11/01/2015, and MRI of the brain performed 12/25/2016 FINDINGS: Brain: No evidence of acute infarction, hemorrhage, hydrocephalus, extra-axial collection or mass lesion/mass effect. Prominence of the ventricles and sulci reflects moderate cortical volume loss. Mild cerebellar atrophy is noted. The brainstem and fourth ventricle are within normal limits. The basal ganglia are unremarkable in appearance. The cerebral hemispheres demonstrate grossly normal gray-white differentiation. No mass effect or midline shift is seen. Vascular: No hyperdense vessel or unexpected calcification. Skull: There is no evidence of fracture; visualized osseous structures are unremarkable in appearance. Sinuses/Orbits: The visualized portions of the orbits are within normal limits. The paranasal sinuses and mastoid air cells are well-aerated. Other: No significant soft tissue abnormalities are seen. IMPRESSION: 1. No evidence of traumatic intracranial injury or fracture. 2. Moderate cortical volume loss noted. Electronically Signed   By: Roanna Raider M.D.   On: 04/22/2017 00:32    Procedures Procedures   Medications Ordered in ED Medications - No data to display   Initial Impression / Assessment and Plan / ED Course  I have reviewed the triage vital signs and the nursing notes.  Pertinent labs & imaging results that were available during my care of the patient were reviewed by  me and considered in my medical decision making (see chart for details).     1:42 AM Pt with increased falls/difficulty ambulating I attempted to ambulate patient and she could only shuffle a few steps and felt like she would fall Due to her use of coumadin and she is a fall risk, will admit She may benefit from MRI brain (denies any new back pain to suggest myelopathy/radiculopathy) However may be medication related as she just started abilify/trazodone within the last week  D/w dr Madelyn Flavors for admission   Final Clinical Impressions(s) / ED Diagnoses   Final diagnoses:  Minor head injury, initial encounter  Fall, initial encounter  Balance disorder    New Prescriptions New Prescriptions   No medications on file     Zadie Rhine, MD 04/22/17 0144

## 2017-04-22 NOTE — Progress Notes (Signed)
NURSING PROGRESS NOTE  ILYANA MANUELE [id number] Admission Data: 04/22/2017 6:41 AM Attending Provider: Clydie Braun, MD [id number], Wynona Canes, MD Code Status: Full  Kathryn Mathews is a 71 y.o. female patient admitted from ED:  -No acute distress noted.  -No complaints of shortness of breath.  -No complaints of chest pain.   Cardiac Monitoring: Box # 10 in place.   Blood pressure (!) 115/52, pulse 63, temperature (!) 97.3 F (36.3 C), temperature source Oral, resp. rate 18, height 4' 7.5" (1.41 m), weight 65.8 kg (145 lb), SpO2 100 %.   IV Fluids:  IV in place, occlusive dsg intact without redness, IV cath hand right, condition patent and no redness none.   Allergies:  Codeine; Acyclovir and related; and Hydrocodone  Past Medical History:   has a past medical history of Alopecia; Anxiety; Anxiety; Aorto-iliac atherosclerosis (HCC) (05/2016); Bipolar disorder (HCC); Chronic sinusitis; Closed left humeral fracture (2010); Closed right humeral fracture (2011); Depression; Diverticulosis; Falls; GERD (gastroesophageal reflux disease); Glucose intolerance (impaired glucose tolerance); High cholesterol; History of chicken pox; History of DVT (deep vein thrombosis); HLD (hyperlipidemia); Humerus fracture; Hyperactivity of bladder; Hypothyroid; Hypothyroidism; Osteopenia (2010, 2016); PAD (peripheral artery disease) (HCC); Phlebitis; Positive ANA (antinuclear antibody); and Pulmonary embolism (HCC).  Past Surgical History:   has a past surgical history that includes Cesarean section; left heart catheterization with coronary angiogram (N/A, 09/05/2014); Cholecystectomy; Cesarean section (1973); Arterial thrombectomy (07/2009); hospitalization (01/2007); Inguinal hernia repair (1980s); Colonoscopy (08/2008); and Cardiac catheterization (08/2014).  Social History:   reports that she has never smoked. She has never used smokeless tobacco. She reports that she does not drink alcohol or use  drugs.  Skin: Patient has scattered bruises from previous fall before admission. No open areas  Patient/Family orientated to room. Information packet given to patient/family. Admission inpatient armband information verified with patient/family to include name and date of birth and placed on patient arm. Side rails up x 2, fall assessment and education completed with patient/family. Patient/family able to verbalize understanding of risk associated with falls and verbalized understanding to call for assistance before getting out of bed. Bed alarm in place. Call light within reach. Patient/family able to voice and demonstrate understanding of unit orientation instructions.    Will continue to evaluate and treat per MD orders.

## 2017-04-22 NOTE — Progress Notes (Signed)
Pt BP 72/48 pulse 64; pt denies any discomfort; asymptomatic other than being sleepy.  MD messaged.

## 2017-04-22 NOTE — Progress Notes (Signed)
ANTICOAGULATION CONSULT NOTE - Initial Consult   Pharmacy Consult for Warfarin  Indication: History of DVT  Allergies  Allergen Reactions  . Codeine Nausea And Vomiting  . Acyclovir And Related Nausea Only  . Hydrocodone Other (See Comments)    Imbalance/falls    Patient Measurements: Height: 4' 7.5" (141 cm) Weight: 145 lb (65.8 kg) IBW/kg (Calculated) : 35.15  Vital Signs: Temp: 99 F (37.2 C) (10/09 2009) Temp Source: Oral (10/09 2009) BP: 124/52 (10/10 0230) Pulse Rate: 65 (10/10 0230)  Labs:  Recent Labs  04/21/17 2016 04/21/17 2355  HGB 12.9  --   HCT 39.8  --   PLT 145*  --   LABPROT  --  18.8*  INR  --  1.59  CREATININE 0.96  --     Estimated Creatinine Clearance: 40.2 mL/min (by C-G formula based on SCr of 0.96 mg/dL).   Medical History: Past Medical History:  Diagnosis Date  . Alopecia    well controlled per previous PCP - improved with biotin  . Anxiety   . Anxiety    when around ppl  . Aorto-iliac atherosclerosis (HCC) 05/2016   by xray  . Bipolar disorder (HCC)    no psych  . Chronic sinusitis   . Closed left humeral fracture 2010   closed left humeral neck fracture, also h/o L wrist fx  . Closed right humeral fracture 2011   coracoid nondisplaced, conservative therapy (Dr. Cleon Gustin)  . Depression   . Diverticulosis    by colonoscopy  . Falls    completed HHPT 10/2011, declined outpt PT  . GERD (gastroesophageal reflux disease)   . Glucose intolerance (impaired glucose tolerance)   . High cholesterol   . History of chicken pox   . 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 HLD (hyperlipidemia)   . Humerus fracture   . Hyperactivity of bladder   . Hypothyroid    previously hyperthyroid  . Hypothyroidism   . Osteopenia 2010, 2016   DEXA 2010: T -1.3 L spine, -2.1 femoral neck. DEXA 2016: T  -2.4 hip, T -0.5 spine; rpt 2 yrs  . PAD (peripheral artery disease) (HCC)   . Phlebitis    left leg stays swollen  . Positive ANA (antinuclear antibody)   . Pulmonary embolism [facility])    Assessment: 71 y/o F presents to ED with trouble walking x 2 weeks. On warfarin PTA for history of DVT. INR is sub-therapeutic at 1.59. Hgb 12.9.   Goal of Therapy:  INR 2-3 Monitor platelets by anticoagulation protocol: Yes   Plan:  -Warfarin 7.5 mg PO x 1 tonight at 1800 -Daily PT/INR -Monitor for bleeding  Abran Duke 04/22/2017,3:04 AM

## 2017-04-22 NOTE — ED Notes (Signed)
Patient transported to CT 

## 2017-04-22 NOTE — Care Management Note (Signed)
Case Management Note  Patient Details  Name: Kathryn Mathews MRN: [id number] Date of Birth: 11/26/1945  Subjective/Objective:   Pt in with gait disturbance and falls. She is from home with spouse.                  Action/Plan: Awaiting PT/OT recommendations. CM following for d/c needs, physician orders.   Expected Discharge Date:                  Expected Discharge Plan:     In-House Referral:     Discharge planning Services     Post Acute Care Choice:    Choice offered to:     DME Arranged:    DME Agency:     HH Arranged:    HH Agency:     Status of Service:  In process, will continue to follow  If discussed at Long Length of Stay Meetings, dates discussed:    Additional Comments:  Kermit Balo, RN 04/22/2017, 10:58 AM

## 2017-04-22 NOTE — Progress Notes (Signed)
CM consulted for benefits check for Xarelto and Eliquis. Benefits check revealed:   # 1. ROSE @  [employer] RX # [phone]   1. XARELTO 15 MG BID  COVER- YES  CO-PAY- $ 47.00  TIER- 3 DRUG  PRIOR APPROVAL- NO   2. XARELTO 20 MG DAILY  COVER- YES  CO-PAY- $ 47.00  TIER- 3 DRUG  PRIOR APPPROVAL- NO   3. ELIQUIS 2.50 MG BID  COVER- YES  CO-PAY- $ 47.00  TIER- 3 DRUG  PRIOR APPROVAL- NO    4.ELIQUIS 5 MG BID  CIOVER- YES  CO-PAY- $ 47.00  TIER- 3 DRUG  PRIOR APPROVAL- NO    PREFERRED PHARMACY: [facility] , [facility] AND  [facility].

## 2017-04-22 NOTE — H&P (Addendum)
History and Physical    Kathryn Mathews ZOX:[id number] DOB: [birth year] DOA: 04/21/2017  Referring MD/NP/PA: Dr. Bebe Shaggy PCP: Eustaquio Boyden, MD  Patient coming from: Home  Chief Complaint: Difficulty ambulating  HPI: Kathryn Mathews is a 71 y.o. female with medical history significant of HTN, HLD, TIA, bipolar disorder, DVT/PE on Coumadin since 1973, and hypothyroidism; who presents with difficulty ambulating since 10/5. She reports having multiple falls since that time with trauma to her head.falls can happen at any time of the day. Previously she reports being able to ambulate without need of assistance. However, over the last 4 days she has gone from being able to ambulate holding onto objects around the home to not being able to ambulate without full assistance from her husband. Patient's had similar symptoms in the past , but never this severe. Her husband notes that she had been recently started on trazodone and Abilify 10 days ago. Patient also reports taking a diet pill which she ordered off TV for the last 4 months. Associated symptoms include worsening memory and pain in the left leg worse with ambulation. Denies feeling dizzy, lightheaded, dysuria, palpitations, chest pain, or focal weakness. The patient's husband stopped giving the patient the patient Abilify 2-3 days ago, had continued giving her that trazodone.  ED Course: Upon admission into the emergency department patient was noted to have signs relatively within normal limits. Labs revealed a platelet count of 145 and INR 1.59, but otherwise were unremarkable. CT scan of the brain showed no acute abnormalities. Ambulation had been attempted in the emergency department, but the patient was unable. TRH called to admit.  Review of Systems  Constitutional: Negative for fever, malaise/fatigue and weight loss.  HENT: Negative for nosebleeds and sinus pain.   Eyes: Negative for photophobia and pain.  Respiratory: Negative for cough  and shortness of breath.   Cardiovascular: Negative for chest pain and orthopnea.  Gastrointestinal: Negative for abdominal pain and vomiting.  Genitourinary: Negative for dysuria and hematuria.  Musculoskeletal: Positive for falls and myalgias.  Skin: Negative for itching and rash.  Neurological: Negative for dizziness, focal weakness, seizures and headaches.       Positive for gait disturbance.  Endo/Heme/Allergies: Negative for polydipsia.  Psychiatric/Behavioral: Positive for memory loss. Negative for substance abuse.    Past Medical History:  Diagnosis Date  . Alopecia    well controlled per previous PCP - improved with biotin  . Anxiety   . Anxiety    when around ppl  . Aorto-iliac atherosclerosis (HCC) 05/2016   by xray  . Bipolar disorder (HCC)    no psych  . Chronic sinusitis   . Closed left humeral fracture 2010   closed left humeral neck fracture, also h/o L wrist fx  . Closed right humeral fracture 2011   coracoid nondisplaced, conservative therapy (Dr. Cleon Gustin)  . Depression   . Diverticulosis    by colonoscopy  . Falls    completed HHPT 10/2011, declined outpt PT  . GERD (gastroesophageal reflux disease)   . Glucose intolerance (impaired glucose tolerance)   . High cholesterol   . History of chicken pox   . 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 HLD (hyperlipidemia)   . Humerus fracture   . Hyperactivity of bladder   . Hypothyroid    previously hyperthyroid  .  Hypothyroidism   . Osteopenia 2010, 2016   DEXA 2010: T -1.3 L spine, -2.1 femoral neck. DEXA 2016: T -2.4 hip, T -0.5 spine; rpt 2 yrs  . PAD (peripheral artery disease) (HCC)   . Phlebitis    left leg stays swollen  . Positive ANA (antinuclear antibody)   . Pulmonary embolism [facility])     Past Surgical History:  Procedure Laterality Date  . ARTERIAL  THROMBECTOMY  07/2009   with R mid popliteal artery angioplasty [balloon] (2011)[[[[  . CARDIAC CATHETERIZATION  08/2014   nl EF, small branch OM not amenable to PCI [name])  . CESAREAN SECTION    . CESAREAN SECTION  1973  . CHOLECYSTECTOMY    . COLONOSCOPY  08/2008   int/ext hem, severe diverticulosis with evidence of itis, rec rpt 10 yrs  . hospitalization  01/2007   depression, ECT  . INGUINAL HERNIA REPAIR  1980s   x2 on right  . LEFT HEART CATHETERIZATION WITH CORONARY ANGIOGRAM N/A 09/05/2014   Procedure: LEFT HEART CATHETERIZATION WITH CORONARY ANGIOGRAM;  Surgeon: Marykay Lex, MD;  Location: [facility] CATH LAB;  Service: Cardiovascular;  Laterality: N/A;     reports that she has never smoked. She has never used smokeless tobacco. She reports that she does not drink alcohol or use drugs.  Allergies  Allergen Reactions  . Codeine Nausea And Vomiting  . Acyclovir And Related Nausea Only  . Hydrocodone Other (See Comments)    Imbalance/falls    Family History  Problem Relation Age of Onset  . Cancer Father   . Heart attack Mother 34  . Coronary artery disease Mother 38  . Hyperlipidemia Mother   . Heart disease Mother   . Hypertension Mother   . Breast cancer Maternal Aunt 54  . Coronary artery disease Maternal Uncle   . Coronary artery disease Maternal Grandmother 70  . Stroke Neg Hx   . Diabetes Neg Hx     Prior to Admission medications   Medication Sig Start Date End Date Taking? Authorizing Provider  acetaminophen (TYLENOL) 500 MG tablet Take 500 mg by mouth every 8 (eight) hours as needed for moderate pain.    [provider]  ARIPiprazole (ABILIFY) 5 MG tablet Take 1 tablet (5 mg total) by mouth at bedtime. 03/31/17   Clapacs, Jackquline Denmark, MD  atorvastatin (LIPITOR) 20 MG tablet Take 1 tablet (20 mg total) by mouth daily. 12/30/16   Eustaquio Boyden, MD  Biotin 1000 MCG tablet Take 1 tablet (1 mg total) by mouth 2 (two) times daily. 04/24/16   Eustaquio Boyden,  MD  buPROPion [facility] SR) 200 MG 12 hr tablet Take 1 tablet (200 mg total) by mouth 2 (two) times daily. 03/31/17   Clapacs, Jackquline Denmark, MD  Cholecalciferol (VITAMIN D) 2000 units CAPS Take 1 capsule (2,000 Units total) by mouth daily. 04/24/16   Eustaquio Boyden, MD  clonazePAM (KLONOPIN) 1 MG tablet Take 1 tablet (1 mg total) by mouth 2 (two) times daily. 03/31/17   Clapacs, Jackquline Denmark, MD  diclofenac sodium (VOLTAREN) 1 % GEL Apply 1 application topically 2 (two) times daily as needed. Patient taking differently: Apply 1 application topically 2 (two) times daily as needed (pain).  06/13/16   Eustaquio Boyden, MD  Docusate Calcium (STOOL SOFTENER PO) Take 1 tablet by mouth daily as needed (constipation). Reported on 11/05/2015    [provider]  lamoTRIgine (LAMICTAL) 100 MG tablet TAKE 1 TABLET 2 TIMES DAILY. 03/31/17   Clapacs, Jackquline Denmark,  MD  levothyroxine (SYNTHROID, LEVOTHROID) 50 MCG tablet TAKE 1 TABLET EVERY DAY 07/15/16   Eustaquio Boyden, MD  PRESCRIPTION MEDICATION Place 2 drops into both eyes daily as needed (dryness). Fresh coat    [provider]  traMADol (ULTRAM) 50 MG tablet Take 1 tablet (50 mg total) by mouth 2 (two) times daily as needed. Patient taking differently: Take 50 mg by mouth 2 (two) times daily as needed for moderate pain.  07/28/16   Eustaquio Boyden, MD  traZODone (DESYREL) 100 MG tablet Take 1 tablet (100 mg total) by mouth at bedtime. 03/31/17   Clapacs, Jackquline Denmark, MD  warfarin (COUMADIN) 5 MG tablet TAKE DAILY AS DIRECTED BY COUMADIN CLINIC. 12/30/16   Eustaquio Boyden, MD    Physical Exam:  Constitutional: NAD, calm, comfortable Vitals:   04/21/17 2315 04/21/17 2330 04/21/17 2345 04/22/17 0000  BP: 116/60 (!) 107/53 118/81 (!) 102/51  Pulse: 67 66 68   Resp: (!) 21 14 (!) 23 19  Temp:      TempSrc:      SpO2: 100% 100% 100%   Weight:      Height:       Eyes: PERRL, lids and conjunctivae normal ENMT: Mucous membranes are moist. Posterior pharynx  clear of any exudate or lesions.Normal dentition.  Neck: normal, supple, no masses, no thyromegaly Respiratory: clear to auscultation bilaterally, no wheezing, no crackles. Normal respiratory effort. No accessory muscle use.  Cardiovascular: Regular rate and rhythm, no murmurs / rubs / gallops. No extremity edema. 2+ pedal pulses. No carotid bruits.  Abdomen: no tenderness, no masses palpated. No hepatosplenomegaly. Bowel sounds positive.  Musculoskeletal: no clubbing / cyanosis. No joint deformity upper and lower extremities. Good ROM, no contractures. Normal muscle tone.  Tenderness to palpation of left thigh. Skin: bruising noted of the lower extremies Neurologic: CN 2-12 grossly intact. Sensation intact, DTR normal. Strength 5/5 in all 4.  Psychiatric: Normal judgment and insight. Alert and oriented x 3. Normal mood.     Labs on Admission: I have personally reviewed following labs and imaging studies  CBC:  Recent Labs Lab 04/21/17 2016  WBC 5.8  NEUTROABS 3.8  HGB 12.9  HCT 39.8  MCV 97.1  PLT 145*   Basic Metabolic Panel:  Recent Labs Lab 04/21/17 2016  NA 136  K 4.2  CL 102  CO2 26  GLUCOSE 98  BUN 10  CREATININE 0.96  CALCIUM 9.1   GFR: Estimated Creatinine Clearance: 40.2 mL/min (by C-G formula based on SCr of 0.96 mg/dL). Liver Function Tests:  Recent Labs Lab 04/21/17 2016  AST 35  ALT 11*  ALKPHOS 55  BILITOT 0.7  PROT 6.2*  ALBUMIN 3.6   No results for input(s): LIPASE, AMYLASE in the last 168 hours. No results for input(s): AMMONIA in the last 168 hours. Coagulation Profile:  Recent Labs Lab 04/21/17 2355  INR 1.59   Cardiac Enzymes: No results for input(s): CKTOTAL, CKMB, CKMBINDEX, TROPONINI in the last 168 hours. BNP (last 3 results) No results for input(s): PROBNP in the last 8760 hours. HbA1C: No results for input(s): HGBA1C in the last 72 hours. CBG: No results for input(s): GLUCAP in the last 168 hours. Lipid Profile: No  results for input(s): CHOL, HDL, LDLCALC, TRIG, CHOLHDL, LDLDIRECT in the last 72 hours. Thyroid Function Tests: No results for input(s): TSH, T4TOTAL, FREET4, T3FREE, THYROIDAB in the last 72 hours. Anemia Panel: No results for input(s): VITAMINB12, FOLATE, FERRITIN, TIBC, IRON, RETICCTPCT in the last 72 hours.  Urine analysis:    Component Value Date/Time   COLORURINE YELLOW 04/21/2017 2026   APPEARANCEUR CLEAR 04/21/2017 2026   LABSPEC 1.020 04/21/2017 2026   PHURINE 5.0 04/21/2017 2026   GLUCOSEU NEGATIVE 04/21/2017 2026   HGBUR NEGATIVE 04/21/2017 2026   BILIRUBINUR NEGATIVE 04/21/2017 2026   BILIRUBINUR Small 05/24/2015 1210   KETONESUR NEGATIVE 04/21/2017 2026   PROTEINUR NEGATIVE 04/21/2017 2026   UROBILINOGEN 0.2 05/24/2015 1210   NITRITE NEGATIVE 04/21/2017 2026   LEUKOCYTESUR TRACE (A) 04/21/2017 2026   Sepsis Labs: No results found for this or any previous visit (from the past 240 hour(s)).   Radiological Exams on Admission: Ct Head Wo Contrast  Result Date: 04/22/2017 CLINICAL DATA:  Status post fall, with concern for head injury. Initial encounter. EXAM: CT HEAD WITHOUT CONTRAST TECHNIQUE: Contiguous axial images were obtained from the base of the skull through the vertex without intravenous contrast. COMPARISON:  CT of the head performed 11/01/2015, and MRI of the brain performed 12/25/2016 FINDINGS: Brain: No evidence of acute infarction, hemorrhage, hydrocephalus, extra-axial collection or mass lesion/mass effect. Prominence of the ventricles and sulci reflects moderate cortical volume loss. Mild cerebellar atrophy is noted. The brainstem and fourth ventricle are within normal limits. The basal ganglia are unremarkable in appearance. The cerebral hemispheres demonstrate grossly normal gray-white differentiation. No mass effect or midline shift is seen. Vascular: No hyperdense vessel or unexpected calcification. Skull: There is no evidence of fracture; visualized osseous  structures are unremarkable in appearance. Sinuses/Orbits: The visualized portions of the orbits are within normal limits. The paranasal sinuses and mastoid air cells are well-aerated. Other: No significant soft tissue abnormalities are seen. IMPRESSION: 1. No evidence of traumatic intracranial injury or fracture. 2. Moderate cortical volume loss noted. Electronically Signed   By: Roanna Raider M.D.   On: 04/22/2017 00:32    EKG: Independently reviewed. Sinus rhythm with prolonged PR interval  Assessment/Plan Falls with gait disturbance: Acute. Over the last week  patient's been having more difficulty ambulating and has fallen multiple times. Initial CT scan of brain showed no acute abnormalities. - Admit to Telemetry  bed - Neuro check - Check MRI of the brain - Pharmacy consult to evaluate medications as possible cause of gait - Physical therapy to eval and treat and a.m. - Social work consult  H/O DVT/PEs with subtherapeutic INR: Acute patient initial INR noted to be 1.59 on admission. - Coumadin per pharmacy  Bipolar disorder: patient reports being recently  Being started on Abilify as well as trazodone. - held Abilify - Continue Lamictal and Wellbutrin  Left thigh pain  - Check x-ray of the left thigh  Abnormal UA: Patient denies having any significant dysuria or urinary frequency symptoms. UA show trace leukocytes with 6 to 30 WBCs. - f/u urine culture  Thrombocytopenia: Platelet count 145 on admission, but appears platelet counts have been documented similar in years past. - Continue to monitor   Hypothyroidism - Check TSH - Continue levothyroxine   DVT prophylaxis: lovenox Code Status: full  Family Communication: Discussed wound care with patient and family present at bedside  Disposition Plan: TBD  Consults called: none  Admission status: Observation  Clydie Braun MD [facility] Pager [phone]   If 7PM-7AM, please contact  night-coverage [email or web address] Password [email or web address]  04/22/2017, 1:40 AM

## 2017-04-22 NOTE — Progress Notes (Signed)
[facility]                                                                              Patient Demographics  Kathryn Mathews, is a 71 y.o. female, DOB - [birth year], ZHY:[id number]  Admit date - 04/21/2017   Admitting Physician Clydie Braun, MD  Outpatient Primary MD for the patient is Eustaquio Boyden, MD  Outpatient specialists:   LOS - 0  days   Medical records reviewed and are as summarized below:    Chief Complaint  Patient presents with  . Fall       Brief summary   Per admit note by Dr. Katrinka Blazing on 10/10 Kathryn Mathews is a 71 y.o. female with medical history significant of HTN, HLD, TIA, bipolar disorder, DVT/PE on Coumadin since 1973, and hypothyroidism; Presented with difficulty ambulating since 10/5. She reported having multiple falls since that time with trauma to her head, falls can happen at any time of the day. Previously she reports being able to ambulate without need of assistance. However, over the last 4 days she has gone from being able to ambulate holding onto objects around the home to not being able to ambulate without full assistance from her husband. Patient's had similar symptoms in the past , but never this severe. Her husband notes that she had been recently started on trazodone and Abilify 10 days ago. Patient also reports taking a diet pill which she ordered off TV for the last 4 months. Associated symptoms include worsening memory and pain in the left leg worse with ambulation. Denied feeling dizzy, lightheaded, dysuria, palpitations, chest pain, or focal weakness. The patient's husband stopped giving the patient the patient Abilify 2-3 days ago, had continued giving her that trazodone.   Assessment & Plan    Principal Problem:   Falls with Gait disturbance - acute, patient reporting more difficulty in breathing and falling - CT of the brain showed no acute abnormalities, MRI of the brain negative for acute stroke - Patient also  reports weakness in the left leg, Doppler ultrasound of the lower extremity is pending - B12, folate within normal limits, obtain MRI of the lumbar spine to rule out any compression - x-ray left femur negative  Active Problems:   Bipolar disorder (HCC) - continue to hold Abilify - Continue Lamictal, Wellbutrin    Hypothyroid - TSH 5.1, continue Synthroid    History of DVT (deep vein thrombosis)On Coumadin - Currently subtherapeutic INR. Discussed about NOAC's, patient wants to discuss with her PCP before starting NOAC's.  - Continue Coumadin per pharmacy    Recurrent falls - PT evaluation  Code Status: full  DVT Prophylaxis: coumadin  Family Communication: Discussed in detail with the patient, all imaging results, lab results explained to the patient  Disposition Plan: pending PT evaluation and further workup  Time Spent in minutes 25 minutes  Procedures:  mRI brain  Consultants:     Antimicrobials:      Medications  Scheduled Meds: . atorvastatin  20 mg Oral Daily  . buPROPion  200 mg Oral BID  . clonazePAM  1 mg Oral BID  . lamoTRIgine  100 mg Oral BID  . levothyroxine  50 mcg Oral QAC breakfast  . warfarin  7.5 mg Oral ONCE-1800  . Warfarin - Pharmacist Dosing Inpatient   Does not apply q1800   Continuous Infusions: PRN Meds:.acetaminophen **OR** acetaminophen, diclofenac sodium, docusate sodium, ondansetron **OR** ondansetron (ZOFRAN) IV   Antibiotics   Anti-infectives    None        Subjective:   Camaya Mathews was seen and examined today. No dizziness or lightheadedness, has not ambulated yet. Patient denies dizziness, chest pain, shortness of breath, abdominal pain, N/V/D/C. No acute events overnight.    Objective:   Vitals:   04/22/17 0230 04/22/17 0330 04/22/17 0515 04/22/17 0936  BP: (!) 124/52 (!) 109/97 (!) 115/52 (!) 110/48  Pulse: 65 70 63 65  Resp: Temp:   (!) 97.3 F (36.3 C) (!) 97.4 F (36.3 C)  TempSrc:   Oral  Oral  SpO2: 99% 91% 100%   Weight:      Height:        Intake/Output Summary (Last 24 hours) at 04/22/17 1108 Last data filed at 04/22/17 0700  Gross per 24 hour  Intake                0 ml  Output              250 ml  Net             -250 ml     Wt Readings from Last 3 Encounters:  04/21/17 65.8 kg (145 lb)  08/25/16 67.1 kg (148 lb)  07/10/16 67.8 kg (149 lb 8 oz)     Exam  General: Alert and oriented x 3, NAD  Eyes:   HEENT:    Cardiovascular: S1 S2 auscultated, no rubs, murmurs or gallops. Regular rate and rhythm.  Respiratory: Clear to auscultation bilaterally, no wheezing, rales or rhonchi  Gastrointestinal: Soft, nontender, nondistended, + bowel sounds  Ext: no pedal edema bilaterally  Neuro: AAOx3, Cr N's II- XII. Moving all 4 extremities, gait not assessed  Musculoskeletal: No digital cyanosis, clubbing  Skin: No rashes  Psych: Normal affect and demeanor, alert and oriented x3    Data Reviewed:  I have personally reviewed following labs and imaging studies  Micro Results No results found for this or any previous visit (from the past 240 hour(s)).  Radiology Reports Ct Head Wo Contrast  Result Date: 04/22/2017 CLINICAL DATA:  Status post fall, with concern for head injury. Initial encounter. EXAM: CT HEAD WITHOUT CONTRAST TECHNIQUE: Contiguous axial images were obtained from the base of the skull through the vertex without intravenous contrast. COMPARISON:  CT of the head performed 11/01/2015, and MRI of the brain performed 12/25/2016 FINDINGS: Brain: No evidence of acute infarction, hemorrhage, hydrocephalus, extra-axial collection or mass lesion/mass effect. Prominence of the ventricles and sulci reflects moderate cortical volume loss. Mild cerebellar atrophy is noted. The brainstem and fourth ventricle are within normal limits. The basal ganglia are unremarkable in appearance. The cerebral hemispheres demonstrate grossly normal gray-white  differentiation. No mass effect or midline shift is seen. Vascular: No hyperdense vessel or unexpected calcification. Skull: There is no evidence of fracture; visualized osseous structures are unremarkable in appearance. Sinuses/Orbits: The visualized portions of the orbits are within normal limits. The paranasal sinuses and mastoid air cells are well-aerated. Other: No significant soft tissue abnormalities are seen. IMPRESSION: 1. No evidence of traumatic intracranial injury or fracture. 2. Moderate cortical volume loss noted. Electronically  Signed   By: Roanna Raider M.D.   On: 04/22/2017 00:32   Mr Brain Limited Wo Contrast  Result Date: 04/22/2017 CLINICAL DATA:  Initial evaluation for acute ataxia, recent fall. EXAM: MRI HEAD WITHOUT CONTRAST TECHNIQUE: Multiplanar, multiecho pulse sequences of the brain and surrounding structures were obtained without intravenous contrast. COMPARISON:  Prior CT earlier the same day. FINDINGS: Brain: Diffuse prominence of the CSF containing spaces compatible with generalized cerebral atrophy, moderate nature. No abnormal foci of restricted diffusion to suggest acute or subacute ischemia. Gray-white matter differentiation maintained. No encephalomalacia to suggest chronic infarction. No foci of susceptibility artifact to suggest acute or chronic intracranial hemorrhage. No mass lesion, midline shift or mass effect. Diffuse ventricular prominence related to global parenchymal volume loss of hydrocephalus. No extra-axial fluid collection. Major dural sinuses are patent. Pituitary gland within normal limits. Vascular: Major intracranial vascular flow voids are maintained. Skull and upper cervical spine: Mild degenerative thickening noted at the tectorial membrane without significant stenosis. Craniocervical junction otherwise unremarkable. Degenerative spondylolysis noted at C4-5 without significant stenosis. Remainder the visualized upper cervical spine unremarkable. Bone  marrow signal intensity within normal limits. No scalp soft tissue abnormality. Sinuses/Orbits: The globes and orbital soft tissues within normal limits. Paranasal sinuses are clear. No mastoid effusion. Inner ear structures normal. Other: None. IMPRESSION: 1. No acute intracranial abnormality identified. 2. Moderate cerebral atrophy. Electronically Signed   By: Rise Mu M.D.   On: 04/22/2017 04:55   Dg Femur Min 2 Views Left  Result Date: 04/22/2017 CLINICAL DATA:  Initial evaluation for acute trauma, fall. EXAM: LEFT FEMUR 2 VIEWS COMPARISON:  None. FINDINGS: No acute fracture or dislocation. Limited views of the left hip and knee are grossly unremarkable. No acute soft tissue abnormality. Diffuse osteopenia noted. IMPRESSION: No acute osseous abnormality about the left femur. Electronically Signed   By: Rise Mu M.D.   On: 04/22/2017 04:57    Lab Data:  CBC:  Recent Labs Lab 04/21/17 2016 04/22/17 0541  WBC 5.8 5.8  NEUTROABS 3.8  --   HGB 12.9 12.7  HCT 39.8 38.6  MCV 97.1 97.2  PLT 145* 127*   Basic Metabolic Panel:  Recent Labs Lab 04/21/17 2016  NA 136  K 4.2  CL 102  CO2 26  GLUCOSE 98  BUN 10  CREATININE 0.96  CALCIUM 9.1   GFR: Estimated Creatinine Clearance: 40.2 mL/min (by C-G formula based on SCr of 0.96 mg/dL). Liver Function Tests:  Recent Labs Lab 04/21/17 2016  AST 35  ALT 11*  ALKPHOS 55  BILITOT 0.7  PROT 6.2*  ALBUMIN 3.6   No results for input(s): LIPASE, AMYLASE in the last 168 hours. No results for input(s): AMMONIA in the last 168 hours. Coagulation Profile:  Recent Labs Lab 04/21/17 2355  INR 1.59   Cardiac Enzymes: No results for input(s): CKTOTAL, CKMB, CKMBINDEX, TROPONINI in the last 168 hours. BNP (last 3 results) No results for input(s): PROBNP in the last 8760 hours. HbA1C: No results for input(s): HGBA1C in the last 72 hours. CBG: No results for input(s): GLUCAP in the last 168 hours. Lipid  Profile: No results for input(s): CHOL, HDL, LDLCALC, TRIG, CHOLHDL, LDLDIRECT in the last 72 hours. Thyroid Function Tests:  Recent Labs  04/22/17 0541  TSH 5.153*   Anemia Panel:  Recent Labs  04/22/17 0627  VITAMINB12 688  FOLATE 18.1   Urine analysis:    Component Value Date/Time   COLORURINE YELLOW 04/21/2017 2026   APPEARANCEUR CLEAR  04/21/2017 2026   LABSPEC 1.020 04/21/2017 2026   PHURINE 5.0 04/21/2017 2026   GLUCOSEU NEGATIVE 04/21/2017 2026   HGBUR NEGATIVE 04/21/2017 2026   BILIRUBINUR NEGATIVE 04/21/2017 2026   BILIRUBINUR Small 05/24/2015 1210   KETONESUR NEGATIVE 04/21/2017 2026   PROTEINUR NEGATIVE 04/21/2017 2026   UROBILINOGEN 0.2 05/24/2015 1210   NITRITE NEGATIVE 04/21/2017 2026   LEUKOCYTESUR TRACE (A) 04/21/2017 2026     Ripudeep Rai M.D. [facility] 04/22/2017, 11:08 AM  Pager: [phone] Between 7am to 7pm - call Pager - [phone]  After 7pm go to [email or web address] - password [email or web address]  Call night coverage person covering after 7pm

## 2017-04-22 NOTE — Progress Notes (Signed)
Physical Therapy Evaluation Patient Details Name: Kathryn Mathews MRN: [id number] DOB: 1945-10-04 Today's Date: 04/22/2017   History of Present Illness  71 y.o.femalewith PMH significant ofHTN, HLD, TIA, bipolar disorder, DVT/PE on Coumadin since 1973, and hypothyroidism;presents with difficulty ambulating since 04/17/17. She reports having multiple falls since that date with trauma to her head. Imagining showing no acute findings.   Clinical Impression  Patient presents with the above. Evaluated patient and noted findings are listed below (See PT Problem List). Prior to admission patient was independent with mobility and self-care activities. Patient demonstrates cognitive and functional deficits unsafe to d/c home at this time. Recommending SNF to address those deficits and maximize function for overall return to prior level of independence and safety for d/c home. PT will continue to follow acutely and progress as tolerated.     Follow Up Recommendations SNF;Supervision/Assistance - 24 hour    Equipment Recommendations   (TBD)    Recommendations for Other Services       Precautions / Restrictions Precautions Precautions: Fall Restrictions Weight Bearing Restrictions: No      Mobility  Bed Mobility Overal bed mobility: Modified Independent Bed Mobility: Supine to Sit     Supine to sit: Modified independent (Device/Increase time)     General bed mobility comments: increased time and effort to complete; use of bed rails but otherwise no physical assistance required  Transfers Overall transfer level: Needs assistance Equipment used: Rolling walker (2 wheeled) Transfers: Sit to/from Stand Sit to Stand: Min assist         General transfer comment: x2 from bed and x1 from commode. Pt requires min assist for balance when coming to standing and VCs for proper UE placement.  Ambulation/Gait Ambulation/Gait assistance: Min guard;Min assist Ambulation Distance (Feet): 90  Feet Assistive device: Rolling walker (2 wheeled) Gait Pattern/deviations: Step-through pattern;Decreased step length - left;Decreased step length - right;Decreased stride length;Trunk flexed;Drifts right/left;Narrow base of support;Decreased dorsiflexion - right;Decreased dorsiflexion - left Gait velocity: decreased Gait velocity interpretation: Below normal speed for age/gender General Gait Details: slow and cautious gait; needing VCs and TCs for pacing and control of RW. Pt demonstrates instability.  Stairs            Wheelchair Mobility    Modified Rankin (Stroke Patients Only)       Balance Overall balance assessment: Needs assistance;History of Falls Sitting-balance support: Feet unsupported;Bilateral upper extremity supported Sitting balance-Leahy Scale: Good     Standing balance support: During functional activity;Bilateral upper extremity supported Standing balance-Leahy Scale: Fair Standing balance comment: able to maintain brief episode of static standing without UE support at sink. Not challenged significantly             High level balance activites: Turns;Head turns;Backward walking;Direction changes High Level Balance Comments: pt demonstrates instability with turns, needing min A and taking several shuffling small steps to complete. Significantly slower gait speed with head turns and backward walking.              Pertinent Vitals/Pain Pain Assessment: No/denies pain    Home Living Family/patient expects to be discharged to:: Private residence Living Arrangements: Spouse/significant other Available Help at Discharge: Family;Available PRN/intermittently Type of Home: Apartment Home Access: Level entry     Home Layout: One level Home Equipment: None      Prior Function Level of Independence: Independent         Comments: pt doesn't drive but states that she was independent and used no AD for mobility  Hand Dominance         Extremity/Trunk Assessment   Upper Extremity Assessment Upper Extremity Assessment: Defer to OT evaluation    Lower Extremity Assessment Lower Extremity Assessment: RLE deficits/detail;LLE deficits/detail RLE Deficits / Details: sensation intact to LT; gross strength 4-/5 LLE Deficits / Details: sensation intact to LT; gross strength 4-/5    Cervical / Trunk Assessment Cervical / Trunk Assessment: Kyphotic  Communication   Communication:  (mild difficulty word finding at times)  Cognition Arousal/Alertness: Awake/alert Behavior During Therapy: WFL for tasks assessed/performed Overall Cognitive Status: Impaired/Different from baseline Area of Impairment: Orientation;Attention;Memory;Following commands;Safety/judgement;Awareness;Problem solving                 Orientation Level: Disoriented to;Time (difficulty providing birth date; required increased time) Current Attention Level: Selective Memory: Decreased short-term memory Following Commands: Follows one step commands consistently;Follows multi-step commands with increased time Safety/Judgement: Decreased awareness of deficits;Decreased awareness of safety Awareness: Intellectual Problem Solving: Requires tactile cues;Requires verbal cues;Slow processing General Comments: no family member present to determine baseline but cognitive deficits demonstrated. Pt reports the most difficulty with memory and not being able to find words sometimes. Very heightened fear of falling      General Comments      Exercises     Assessment/Plan    PT Assessment Patient needs continued PT services  PT Problem List Decreased balance;Decreased mobility;Decreased cognition;Decreased knowledge of use of DME;Decreased safety awareness;Decreased range of motion;Decreased strength       PT Treatment Interventions DME instruction;Stair training;Gait training;Functional mobility training;Therapeutic activities;Therapeutic exercise;Balance  training;Neuromuscular re-education;Patient/family education    PT Goals (Current goals can be found in the Care Plan section)  Acute Rehab PT Goals Patient Stated Goal: to not fall anymore PT Goal Formulation: With patient Time For Goal Achievement: 05/06/17 Potential to Achieve Goals: Good    Frequency Min 3X/week   Barriers to discharge        Co-evaluation               AM-PAC PT "6 Clicks" Daily Activity  Outcome Measure Difficulty turning over in bed (including adjusting bedclothes, sheets and blankets)?: A Little Difficulty moving from lying on back to sitting on the side of the bed? : A Little Difficulty sitting down on and standing up from a chair with arms (e.g., wheelchair, bedside commode, etc,.)?: A Lot Help needed moving to and from a bed to chair (including a wheelchair)?: A Lot Help needed walking in hospital room?: A Lot Help needed climbing 3-5 steps with a railing? : Total 6 Click Score: 13    End of Session Equipment Utilized During Treatment: Gait belt Activity Tolerance: Patient tolerated treatment well Patient left: in chair;with chair alarm set;with call bell/phone within reach Nurse Communication: Mobility status PT Visit Diagnosis: Repeated falls (R29.6);History of falling (Z91.81);Unsteadiness on feet (R26.81)    Time: 1610-9604 PT Time Calculation (min) (ACUTE ONLY): 24 min   Charges:   PT Evaluation $PT Eval Moderate Complexity: 1 Mod     PT G CodesMckinley Jewel, SPT [phone] office   Fonnie Birkenhead 04/22/2017, 1:23 PM

## 2017-04-22 NOTE — ED Notes (Signed)
Admitting MD at bedside.

## 2017-04-23 ENCOUNTER — Inpatient Hospital Stay (HOSPITAL_BASED_OUTPATIENT_CLINIC_OR_DEPARTMENT_OTHER): Payer: Medicare HMO

## 2017-04-23 ENCOUNTER — Ambulatory Visit: Payer: Commercial Managed Care - HMO

## 2017-04-23 DIAGNOSIS — E039 Hypothyroidism, unspecified: Secondary | ICD-10-CM

## 2017-04-23 DIAGNOSIS — W19XXXA Unspecified fall, initial encounter: Secondary | ICD-10-CM

## 2017-04-23 DIAGNOSIS — Z86718 Personal history of other venous thrombosis and embolism: Secondary | ICD-10-CM | POA: Diagnosis not present

## 2017-04-23 DIAGNOSIS — R531 Weakness: Secondary | ICD-10-CM

## 2017-04-23 DIAGNOSIS — F3177 Bipolar disorder, in partial remission, most recent episode mixed: Secondary | ICD-10-CM | POA: Diagnosis not present

## 2017-04-23 DIAGNOSIS — R269 Unspecified abnormalities of gait and mobility: Secondary | ICD-10-CM | POA: Diagnosis not present

## 2017-04-23 DIAGNOSIS — R2689 Other abnormalities of gait and mobility: Secondary | ICD-10-CM | POA: Diagnosis not present

## 2017-04-23 LAB — CBC
HCT: 39.5 % (ref 36.0–46.0)
Hemoglobin: 12.8 g/dL (ref 12.0–15.0)
MCH: 31.5 pg (ref 26.0–34.0)
MCHC: 32.4 g/dL (ref 30.0–36.0)
MCV: 97.3 fL (ref 78.0–100.0)
PLATELETS: 146 10*3/uL — AB (ref 150–400)
RBC: 4.06 MIL/uL (ref 3.87–5.11)
RDW: 13.4 % (ref 11.5–15.5)
WBC: 5.8 10*3/uL (ref 4.0–10.5)

## 2017-04-23 LAB — BASIC METABOLIC PANEL
Anion gap: 7 (ref 5–15)
BUN: 12 mg/dL (ref 6–20)
CHLORIDE: 107 mmol/L (ref 101–111)
CO2: 26 mmol/L (ref 22–32)
CREATININE: 0.75 mg/dL (ref 0.44–1.00)
Calcium: 9.3 mg/dL (ref 8.9–10.3)
GFR calc Af Amer: >60 mL/min (ref >60)
GLUCOSE: 101 mg/dL — AB (ref 65–99)
Potassium: 3.9 mmol/L (ref 3.5–5.1)
SODIUM: 140 mmol/L (ref 135–145)

## 2017-04-23 LAB — URINE CULTURE

## 2017-04-23 LAB — PROTIME-INR
INR: 1.77
Prothrombin Time: 20.4 seconds — ABNORMAL HIGH (ref 11.4–15.2)

## 2017-04-23 MED ORDER — TRAMADOL HCL 50 MG PO TABS: 50.0000 mg | ORAL_TABLET | Freq: Two times a day (BID) | ORAL | 0 refills | Status: AC | PRN

## 2017-04-23 MED ORDER — CYCLOBENZAPRINE HCL 5 MG PO TABS: 5.0000 mg | ORAL_TABLET | Freq: Two times a day (BID) | ORAL | 0 refills | Status: AC

## 2017-04-23 MED ORDER — WARFARIN SODIUM 7.5 MG PO TABS: 7.5000 mg | ORAL_TABLET | Freq: Once | ORAL | Status: DC

## 2017-04-23 MED ORDER — TRAMADOL HCL 50 MG PO TABS: 50.0000 mg | ORAL_TABLET | Freq: Three times a day (TID) | ORAL | Status: DC | PRN

## 2017-04-23 MED ORDER — CYCLOBENZAPRINE HCL 10 MG PO TABS
5.0000 mg | ORAL_TABLET | Freq: Two times a day (BID) | ORAL | Status: DC
  Administered 2017-04-23: 5 mg via ORAL
  Filled 2017-04-23: qty 1

## 2017-04-23 NOTE — Progress Notes (Signed)
ANTICOAGULATION CONSULT NOTE   Pharmacy Consult for Warfarin  Indication: History of DVT  Allergies  Allergen Reactions  . Codeine Nausea And Vomiting  . Acyclovir And Related Nausea Only  . Hydrocodone Other (See Comments)    Imbalance/falls    Patient Measurements: Height: 4' 7.5" (141 cm) Weight: 145 lb (65.8 kg) IBW/kg (Calculated) : 35.15  Vital Signs: Temp: 98.2 F (36.8 C) (10/11 0400) Temp Source: Oral (10/11 0400) BP: 125/52 (10/11 0400) Pulse Rate: 65 (10/11 0400)  Labs:  Recent Labs  04/21/17 2016 04/21/17 2355 04/22/17 0541 04/23/17 0630  HGB 12.9  --  12.7 12.8  HCT 39.8  --  38.6 39.5  PLT 145*  --  127* 146*  LABPROT  --  18.8*  --  20.4*  INR  --  1.59  --  1.77  CREATININE 0.96  --   --  0.75    Estimated Creatinine Clearance: 48.3 mL/min (by C-G formula based on SCr of 0.75 mg/dL).  Assessment: 71 y/o F presents to ED with trouble walking x 2 weeks. On warfarin PTA for history of DVT. INR is sub-therapeutic at 1.77. Hgb 12.9.   Goal of Therapy:  INR 2-3 Monitor platelets by anticoagulation protocol: Yes   Plan:  -Warfarin 7.5 mg PO x 1 tonight at 1800 -Daily PT/INR -Monitor for s/sx bleeding  Alyson Ingles, Student Pharmacist 04/23/2017,8:43 AM  Agree with above assessment Thank you Okey Regal, PharmD [phone]

## 2017-04-23 NOTE — Consult Note (Signed)
           [facility] CM Primary Care Navigator  04/23/2017  Kathryn Mathews February 12, 1946 [id number]   Wentto seepatient at the bedsideto identify possible discharge needs but she was alreadydischargedper staff report.  Patient was discharged home today with home health services arranged since skilled nursing facility (SNF) was declined per Inpatient CM note.  Primary care provider's officeis listed as doing transition of care (TOC).  Patient has a discharge instruction to follow-up with primary care provider within2 weeks.   For questions, please contact:  Wyatt Haste, BSN, RN- [facility] Primary Care Navigator  Telephone: [phone] [facility]

## 2017-04-23 NOTE — Progress Notes (Signed)
Physical Therapy Treatment Patient Details Name: Kathryn Mathews MRN: [id number] DOB: 1946/04/21 Today's Date: 04/23/2017    History of Present Illness 71 y.o.femalewith PMH significant ofHTN, HLD, TIA, bipolar disorder, DVT/PE on Coumadin since 1973, and hypothyroidism;presents with difficulty ambulating since 04/17/17. She reports having multiple falls since that date with trauma to her head. Imagining showing no acute findings.     PT Comments    Pt seen for mobility progression and making steady progress towards achieving her current functional mobility goals. Pt would continue to benefit from skilled physical therapy services at this time while admitted and after d/c to address the below listed limitations in order to improve overall safety and independence with functional mobility.    Follow Up Recommendations  SNF;Supervision/Assistance - 24 hour     Equipment Recommendations  Rolling walker with 5" wheels (youth sized RW)    Recommendations for Other Services       Precautions / Restrictions Precautions Precautions: Fall Restrictions Weight Bearing Restrictions: No    Mobility  Bed Mobility Overal bed mobility: Modified Independent                Transfers Overall transfer level: Needs assistance Equipment used: Rolling walker (2 wheeled) Transfers: Sit to/from Stand Sit to Stand: Min guard         General transfer comment: increased time, cueing for safety with RW, min guard for safety  Ambulation/Gait Ambulation/Gait assistance: Min guard;Min assist Ambulation Distance (Feet): 100 Feet (100' x2 with standing rest break) Assistive device: Rolling walker (2 wheeled) Gait Pattern/deviations: Step-through pattern;Decreased step length - left;Decreased step length - right;Decreased stride length;Trunk flexed;Drifts right/left;Narrow base of support Gait velocity: decreased Gait velocity interpretation: Below normal speed for age/gender General Gait  Details: pt with modest instability requiring constant close min guard to occasional min A for safety and stability; cueing for safety with RW   Stairs            Wheelchair Mobility    Modified Rankin (Stroke Patients Only)       Balance Overall balance assessment: Needs assistance;History of Falls Sitting-balance support: Feet unsupported;Bilateral upper extremity supported Sitting balance-Leahy Scale: Good     Standing balance support: During functional activity;No upper extremity supported Standing balance-Leahy Scale: Fair                              Cognition Arousal/Alertness: Awake/alert Behavior During Therapy: WFL for tasks assessed/performed Overall Cognitive Status: Impaired/Different from baseline Area of Impairment: Memory;Following commands;Safety/judgement;Problem solving                     Memory: Decreased short-term memory;Decreased recall of precautions Following Commands: Follows one step commands consistently;Follows multi-step commands with increased time Safety/Judgement: Decreased awareness of deficits;Decreased awareness of safety Awareness: Intellectual Problem Solving: Requires tactile cues;Requires verbal cues;Slow processing        Exercises      General Comments        Pertinent Vitals/Pain Pain Assessment: No/denies pain    Home Living                      Prior Function            PT Goals (current goals can now be found in the care plan section) Acute Rehab PT Goals PT Goal Formulation: With patient Time For Goal Achievement: 05/06/17 Potential to Achieve Goals: Good Progress towards PT goals: Progressing  toward goals    Frequency    Min 3X/week      PT Plan Current plan remains appropriate    Co-evaluation              AM-PAC PT "6 Clicks" Daily Activity  Outcome Measure  Difficulty turning over in bed (including adjusting bedclothes, sheets and blankets)?:  None Difficulty moving from lying on back to sitting on the side of the bed? : None Difficulty sitting down on and standing up from a chair with arms (e.g., wheelchair, bedside commode, etc,.)?: Unable Help needed moving to and from a bed to chair (including a wheelchair)?: A Little Help needed walking in hospital room?: A Little Help needed climbing 3-5 steps with a railing? : A Lot 6 Click Score: 17    End of Session Equipment Utilized During Treatment: Gait belt Activity Tolerance: Patient tolerated treatment well Patient left: in bed;with call bell/phone within reach;with bed alarm set Nurse Communication: Mobility status PT Visit Diagnosis: Repeated falls (R29.6);History of falling (Z91.81);Unsteadiness on feet (R26.81)     Time: 1610-9604 PT Time Calculation (min) (ACUTE ONLY): 28 min  Charges:  $Gait Training: 8-22 mins $Therapeutic Activity: 8-22 mins                    G Codes:       [place], [place], [place] [phone]    Kathryn Mathews 04/23/2017, 9:52 AM

## 2017-04-23 NOTE — Care Management Note (Signed)
Case Management Note  Patient Details  Name: Kathryn Mathews MRN: [id number] Date of Birth: 05/09/1946  Subjective/Objective:                    Action/Plan: Pt discharging home with [facility] services. CM met with the patient and her spouse and provided a list of [place] agencies. They selected [facility]. Cheryl with [facility] notified and accepted the referral.  Pt with orders for walker. Jermaine with [facility] notified and will deliver the equipment to the room.  Husband to provide transportation home.   Expected Discharge Date:  04/23/17               Expected Discharge Plan:  [place]  In-House Referral:     Discharge planning Services  CM Consult  Post Acute Care Choice:  Durable Medical Equipment, Home Health Choice offered to:  Patient, Spouse  DME Arranged:  Walker rolling DME Agency:  [place] Arranged:  PT, OT, Nurse's Aide [place] Agency:  [place]  Status of Service:  Completed, signed off  If discussed at [place] of Stay Meetings, dates discussed:    Additional Comments:  Pollie Friar, RN 04/23/2017, 11:27 AM

## 2017-04-23 NOTE — Progress Notes (Signed)
VASCULAR LAB PRELIMINARY  PRELIMINARY  PRELIMINARY  PRELIMINARY  Bilateral lower extremity venous duplex completed.    Preliminary report:  There is no DVT or SVT noted in the bilateral lower extremities.   Shandell Jallow, RVT 04/23/2017, 8:34 AM

## 2017-04-23 NOTE — Evaluation (Signed)
Occupational Therapy Evaluation Patient Details Name: Kathryn Mathews MRN: [id number] DOB: February 25, 1946 Today's Date: 04/23/2017    History of Present Illness 71 y.o.femalewith PMH significant ofHTN, HLD, TIA, bipolar disorder, DVT/PE on Coumadin since 1973, and hypothyroidism;presents with difficulty ambulating since 04/17/17. She reports having multiple falls since that date with trauma to her head. Imagining showing no acute findings.    Clinical Impression   Pt admitted with the above diagnoses and presents with below problem list. Pt will benefit from continued acute OT to address the below listed deficits and maximize independence with basic ADLs prior to d/c home. PTA pt was independent with ADLs. Pt is currently mostly setup to min guard with ADLs and transfers/mobility. One LOB walking household distance with rw, needed min A to steady. Spouse present and included in education. Pt is for d/c home today. Will defer further OT needs to [facility].      Follow Up Recommendations  Home health OT    Equipment Recommendations  None recommended by OT    Recommendations for Other Services       Precautions / Restrictions Precautions Precautions: Fall Restrictions Weight Bearing Restrictions: No      Mobility Bed Mobility Overal bed mobility: Modified Independent             General bed mobility comments: EOB on OT arrival  Transfers Overall transfer level: Needs assistance Equipment used: Rolling walker (2 wheeled) Transfers: Sit to/from Stand Sit to Stand: Min guard         General transfer comment: increased time, cueing for safety with RW, min guard for safety. Tendency to want to leave rw to the side before sitting down.     Balance Overall balance assessment: Needs assistance;History of Falls Sitting-balance support: Feet unsupported;Bilateral upper extremity supported Sitting balance-Leahy Scale: Good     Standing balance support: During functional  activity;No upper extremity supported Standing balance-Leahy Scale: Poor Standing balance comment: mostly fair, 1 LOB slowly ambulating in hall with rw and min A to correct.                            ADL either performed or assessed with clinical judgement   ADL Overall ADL's : Needs assistance/impaired Eating/Feeding: Set up;Sitting   Grooming: Set up;Sitting   Upper Body Bathing: Set up;Sitting   Lower Body Bathing: Min guard;Sit to/from stand   Upper Body Dressing : Set up;Sitting   Lower Body Dressing: Min guard;Sit to/from stand   Toilet Transfer: Min guard;Ambulation;Minimal assistance   Toileting- Architect and Hygiene: Min guard;Sit to/from stand   Tub/ Shower Transfer: Min guard;Ambulation;Shower seat;Rolling walker;Minimal assistance   Functional mobility during ADLs: Min guard;Minimal assistance;Rolling walker General ADL Comments: Pt completed household distance functional mobility mostly min guard with one instance of LOB, min A to steady. Fall prevention techniques and education on rw during functional tasks discussed with pt and spouse.      Vision         Perception     Praxis      Pertinent Vitals/Pain Pain Assessment: No/denies pain     Hand Dominance     Extremity/Trunk Assessment Upper Extremity Assessment Upper Extremity Assessment: Overall WFL for tasks assessed;Generalized weakness   Lower Extremity Assessment Lower Extremity Assessment: Defer to PT evaluation   Cervical / Trunk Assessment Cervical / Trunk Assessment: Kyphotic   Communication Communication Communication: No difficulties   Cognition Arousal/Alertness: Awake/alert Behavior During Therapy: [facility] for  tasks assessed/performed Overall Cognitive Status: Impaired/Different from baseline Area of Impairment: Memory;Following commands;Safety/judgement;Problem solving                   Current Attention Level: Selective Memory: Decreased  short-term memory;Decreased recall of precautions Following Commands: Follows one step commands consistently;Follows multi-step commands with increased time Safety/Judgement: Decreased awareness of deficits;Decreased awareness of safety Awareness: Intellectual Problem Solving: Requires tactile cues;Requires verbal cues;Slow processing     General Comments       Exercises     Shoulder Instructions      Home Living Family/patient expects to be discharged to:: Private residence Living Arrangements: Spouse/significant other Available Help at Discharge: Family;Available PRN/intermittently Type of Home: Apartment Home Access: Level entry     Home Layout: One level     Bathroom Shower/Tub: Chief Strategy Officer: Standard Bathroom Accessibility: Yes   Home Equipment: Shower seat;Grab bars - tub/shower          Prior Functioning/Environment Level of Independence: Independent        Comments: pt doesn't drive but states that she was independent and used no AD for mobility        OT Problem List: Impaired balance (sitting and/or standing);Decreased knowledge of use of DME or AE;Decreased knowledge of precautions      OT Treatment/Interventions:      OT Goals(Current goals can be found in the care plan section) Acute Rehab OT Goals Patient Stated Goal: to not fall anymore  OT Frequency:     Barriers to D/C:            Co-evaluation              AM-PAC PT "6 Clicks" Daily Activity     Outcome Measure Help from another person eating meals?: None Help from another person taking care of personal grooming?: None Help from another person toileting, which includes using toliet, bedpan, or urinal?: A Little Help from another person bathing (including washing, rinsing, drying)?: A Little Help from another person to put on and taking off regular upper body clothing?: None Help from another person to put on and taking off regular lower body clothing?: A  Little 6 Click Score: 21   End of Session Equipment Utilized During Treatment: Rolling walker  Activity Tolerance: Patient tolerated treatment well Patient left: in chair;with call bell/phone within reach;with family/visitor present  OT Visit Diagnosis: Unsteadiness on feet (R26.81);History of falling (Z91.81)                Time: 7829-5621 OT Time Calculation (min): 11 min Charges:  OT General Charges $OT Visit: 1 Visit OT Evaluation $OT Eval Low Complexity: 1 Low G-Codes: OT G-codes **NOT FOR INPATIENT CLASS** Functional Assessment Tool Used: AM-PAC 6 Clicks Daily Activity Functional Limitation: Self care Self Care Current Status (H0865): At least 20 percent but less than 40 percent impaired, limited or restricted Self Care Discharge Status [phone]): At least 20 percent but less than 40 percent impaired, limited or restricted     Pilar Grammes 04/23/2017, 12:13 PM

## 2017-04-23 NOTE — Care Management Obs Status (Signed)
MEDICARE OBSERVATION STATUS NOTIFICATION   Patient Details  Name: LAKECHIA NAY MRN: [id number] Date of Birth: 03/20/1946   Medicare Observation Status Notification Given:  Yes    Kermit Balo, RN 04/23/2017, 11:23 AM

## 2017-04-23 NOTE — Discharge Summary (Signed)
Physician Discharge Summary   Patient ID: Kathryn Mathews MRN: [id number] DOB/AGE: [birth year] 71 y.o.  Admit date: 04/21/2017 Discharge date: 04/23/2017  Primary Care Physician:  Eustaquio Boyden, MD  Discharge Diagnoses:      Fall with gait instability . Insomnia . Hypothyroid . Bipolar disorder (HCC)   History of DVT on Coumadin   Chronic back pain   Consults:  none  Recommendations for Outpatient Follow-up:  1. Patient was recommended skilled nursing acility for rehabilitation however she refused. Home health PT, OT, home health aide was arranged by the case management. 2. Please repeat CBC/BMET at next visit   DIET: heart healthy diet    Allergies:   Allergies  Allergen Reactions  . Codeine Nausea And Vomiting  . Acyclovir And Related Nausea Only  . Hydrocodone Other (See Comments)    Imbalance/falls     DISCHARGE MEDICATIONS: Current Discharge Medication List    START taking these medications   Details  cyclobenzaprine (FLEXERIL) 5 MG tablet Take 1 tablet (5 mg total) by mouth 2 (two) times daily. Qty: 60 tablet, Refills: 0      CONTINUE these medications which have CHANGED   Details  traMADol (ULTRAM) 50 MG tablet Take 1 tablet (50 mg total) by mouth 2 (two) times daily as needed for moderate pain. Qty: 20 tablet, Refills: 0      CONTINUE these medications which have NOT CHANGED   Details  acetaminophen (TYLENOL) 500 MG tablet Take 500 mg by mouth every 8 (eight) hours as needed for moderate pain.    atorvastatin (LIPITOR) 20 MG tablet Take 1 tablet (20 mg total) by mouth daily. Qty: 90 tablet, Refills: 2    Biotin 1000 MCG tablet Take 1 tablet (1 mg total) by mouth 2 (two) times daily. Qty: 180 tablet, Refills: 3    buPROPion (WELLBUTRIN SR) 200 MG 12 hr tablet Take 1 tablet (200 mg total) by mouth 2 (two) times daily. Qty: 180 tablet, Refills: 1    Cholecalciferol (VITAMIN D) 2000 units CAPS Take 1 capsule (2,000 Units total) by mouth  daily. Qty: 90 capsule, Refills: 3    clonazePAM (KLONOPIN) 1 MG tablet Take 1 tablet (1 mg total) by mouth 2 (two) times daily. Qty: 180 tablet, Refills: 1    diclofenac sodium (VOLTAREN) 1 % GEL Apply 1 application topically 2 (two) times daily as needed. Qty: 1 Tube, Refills: 1    Docusate Calcium (STOOL SOFTENER PO) Take 1 tablet by mouth daily as needed (constipation). Reported on 11/05/2015    lamoTRIgine (LAMICTAL) 100 MG tablet TAKE 1 TABLET 2 TIMES DAILY. Qty: 180 tablet, Refills: 1    levothyroxine (SYNTHROID, LEVOTHROID) 50 MCG tablet TAKE 1 TABLET EVERY DAY Qty: 90 tablet, Refills: 3    PRESCRIPTION MEDICATION Place 2 drops into both eyes daily as needed (dryness). Fresh coat    warfarin (COUMADIN) 5 MG tablet TAKE DAILY AS DIRECTED BY COUMADIN CLINIC. Qty: 90 tablet, Refills: 0      STOP taking these medications     ARIPiprazole (ABILIFY) 5 MG tablet      traZODone (DESYREL) 100 MG tablet          Brief H and P: For complete details please refer to admission H and P, but in brief Per admit note by Dr. Katrinka Blazing on 10/10 Kathryn Evelyn Seppeyis a 71 y.o.femalewith medical history significant ofHTN, HLD, TIA, bipolar disorder, DVT/PE on Coumadin since 1973, and hypothyroidism;Presented with difficulty ambulating since 10/5. She reported having multiple falls  since that time with trauma to her head, falls can happen at any time of the day.Previously she reports being able to ambulate without need of assistance. However, over the last 4 days she has gone from being able to ambulate holding onto objects around the home to not being able to ambulate without full assistance from her husband. Patient's had similar symptoms in the past , but never this severe. Her husband notes that she had been recently started on trazodone and Abilify 10 days ago. Patient also reports taking a diet pill which she ordered off TV for the last 4 months. Associated symptoms include worsening memory  and pain in the left leg worse with ambulation. Denied feeling dizzy, lightheaded, dysuria, palpitations, chest pain, or focal weakness. The patient's husband stopped giving the patient the patient Abilify 2-3 days ago, had continued giving her that trazodone.   Hospital Course:  Falls with Gait disturbance - CT of the brain showed no acute abnormalities, MRI of the brain negative for acute stroke. No neuro deficits. - MRI of the lumbar spine showed unchanged grade 2 L5-S1 anterolisthesis with severe bilateral neural foraminal stenosis, secondary to bilateral pars intra-articular defects. Chronic L1 compression fracture, unchanged. Medially projecting synovial cyst arising from right L3-L4 facet not contributing to any stenosis. - Doppler lower extremity showed no DVT or SVT in the bilateral lower extremities - B12, folate within normal limits - x-ray left femur negative - PT evaluation was done and recommended skilled nursing facility however patient declined. Home health PT, OT, home health aide was arranged by the case management     Bipolar disorder (HCC) - continue to hold Abilify - Continue Lamictal, Wellbutrin    Hypothyroid - TSH 5.1, continue Synthroid    History of DVT (deep vein thrombosis)On Coumadin - subtherapeutic INR on presentation. Discussed about NOAC's, patient wants to discuss with her PCP before starting NOAC's.  - continue Coumadin, defer to PCP if this can be discontinued given no DVTs. Doppler ultrasound of the lower extremities did not show any DVT    Recurrent falls - PT evaluation recommended skilled nursing facility however patient refused. Home health was arranged.  Day of Discharge BP (!) 130/54 (BP Location: Left Arm)   Pulse 65   Temp 98.1 F (36.7 C) (Oral)   Resp 18   Ht 4' 7.5" (1.41 m)   Wt 65.8 kg (145 lb)   SpO2 98%   BMI 33.10 kg/m   Physical Exam: General: Alert and awake oriented x3 not in any acute distress. HEENT: anicteric  sclera, pupils reactive to light and accommodation CVS: S1-S2 clear no murmur rubs or gallops Chest: clear to auscultation bilaterally, no wheezing rales or rhonchi Abdomen: soft nontender, nondistended, normal bowel sounds Extremities: no cyanosis, clubbing or edema noted bilaterally Neuro: Cranial nerves II-XII intact, no focal neurological deficits   The results of significant diagnostics from this hospitalization (including imaging, microbiology, ancillary and laboratory) are listed below for reference.    LAB RESULTS: Basic Metabolic Panel:  Recent Labs Lab 04/21/17 2016 04/23/17 0630  NA 136 140  K 4.2 3.9  CL 102 107  CO2 26 26  GLUCOSE 98 101*  BUN 10 12  CREATININE 0.96 0.75  CALCIUM 9.1 9.3   Liver Function Tests:  Recent Labs Lab 04/21/17 2016  AST 35  ALT 11*  ALKPHOS 55  BILITOT 0.7  PROT 6.2*  ALBUMIN 3.6   No results for input(s): LIPASE, AMYLASE in the last 168 hours. No results  for input(s): AMMONIA in the last 168 hours. CBC:  Recent Labs Lab 04/21/17 2016 04/22/17 0541 04/23/17 0630  WBC 5.8 5.8 5.8  NEUTROABS 3.8  --   --   HGB 12.9 12.7 12.8  HCT 39.8 38.6 39.5  MCV 97.1 97.2 97.3  PLT 145* 127* 146*   Cardiac Enzymes: No results for input(s): CKTOTAL, CKMB, CKMBINDEX, TROPONINI in the last 168 hours. BNP: Invalid input(s): POCBNP CBG: No results for input(s): GLUCAP in the last 168 hours.  Significant Diagnostic Studies:  Ct Head Wo Contrast  Result Date: 04/22/2017 CLINICAL DATA:  Status post fall, with concern for head injury. Initial encounter. EXAM: CT HEAD WITHOUT CONTRAST TECHNIQUE: Contiguous axial images were obtained from the base of the skull through the vertex without intravenous contrast. COMPARISON:  CT of the head performed 11/01/2015, and MRI of the brain performed 12/25/2016 FINDINGS: Brain: No evidence of acute infarction, hemorrhage, hydrocephalus, extra-axial collection or mass lesion/mass effect. Prominence of  the ventricles and sulci reflects moderate cortical volume loss. Mild cerebellar atrophy is noted. The brainstem and fourth ventricle are within normal limits. The basal ganglia are unremarkable in appearance. The cerebral hemispheres demonstrate grossly normal gray-white differentiation. No mass effect or midline shift is seen. Vascular: No hyperdense vessel or unexpected calcification. Skull: There is no evidence of fracture; visualized osseous structures are unremarkable in appearance. Sinuses/Orbits: The visualized portions of the orbits are within normal limits. The paranasal sinuses and mastoid air cells are well-aerated. Other: No significant soft tissue abnormalities are seen. IMPRESSION: 1. No evidence of traumatic intracranial injury or fracture. 2. Moderate cortical volume loss noted. Electronically Signed   By: Roanna Raider M.D.   On: 04/22/2017 00:32   Mr Lumbar Spine W Wo Contrast  Result Date: 04/22/2017 CLINICAL DATA:  Spinal stenosis.  Difficulty walking. EXAM: MRI LUMBAR SPINE WITHOUT AND WITH CONTRAST TECHNIQUE: Multiplanar and multiecho pulse sequences of the lumbar spine were obtained without and with intravenous contrast. CONTRAST:  14mL MULTIHANCE GADOBENATE DIMEGLUMINE 529 MG/ML IV SOLN COMPARISON:  Lumbar spine radiograph 06/09/2016 Lumbar spine MRI 07/20/2012 FINDINGS: Segmentation:  Normal Alignment:  Grade 2 L5-S1 anterolisthesis, unchanged. Vertebrae: Chronic compression fracture of L1 is unchanged. No acute abnormality. Conus medullaris: Extends to the L2 level and appears normal. Paraspinal and other soft tissues: Negative. Disc levels: Above the T12 level, there is no disc herniation, spinal canal stenosis or neuroforaminal stenosis. T12-L1: Mild retropulsion of the superior L1 endplate mildly narrows the ventral thecal sac the spinal canal remains widely patent. No neural foraminal stenosis. L1-L2: Small disc bulge without stenosis. L2-L3: Mild narrowing of the left neural  foramen. No spinal canal stenosis. L3-L4: No disc bulge or spinal canal stenosis. There is a synovial cyst projecting medially from the right facet joint, measuring 4 x 4 mm. L4-L5: Normal disc space and facets. No spinal canal or neuroforaminal stenosis. L5-S1: Grade 2 anterolisthesis with uncovering of the disc. Spinal canal remains patent. There is severe bilateral neural foraminal stenosis. Bilateral pars interarticularis defects. Visualized sacrum: Normal. IMPRESSION: 1. 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 2. Medially projecting synovial cyst arising from the right L3-L4 facet, new from the prior study, but not contributing to any stenosis. 3. Chronic L1 compression fracture, unchanged. Electronically Signed   By: Deatra Robinson M.D.   On: 04/22/2017 21:22   Mr Brain Limited Wo Contrast  Result Date: 04/22/2017 CLINICAL DATA:  Initial evaluation for acute ataxia, recent  fall. EXAM: MRI HEAD WITHOUT CONTRAST TECHNIQUE: Multiplanar, multiecho pulse sequences of the brain and surrounding structures were obtained without intravenous contrast. COMPARISON:  Prior CT earlier the same day. FINDINGS: Brain: Diffuse prominence of the CSF containing spaces compatible with generalized cerebral atrophy, moderate nature. No abnormal foci of restricted diffusion to suggest acute or subacute ischemia. Gray-white matter differentiation maintained. No encephalomalacia to suggest chronic infarction. No foci of susceptibility artifact to suggest acute or chronic intracranial hemorrhage. No mass lesion, midline shift or mass effect. Diffuse ventricular prominence related to global parenchymal volume loss of hydrocephalus. No extra-axial fluid collection. Major dural sinuses are patent. Pituitary gland within normal limits. Vascular: Major intracranial vascular flow voids are maintained. Skull and upper cervical spine: Mild  degenerative thickening noted at the tectorial membrane without significant stenosis. Craniocervical junction otherwise unremarkable. Degenerative spondylolysis noted at C4-5 without significant stenosis. Remainder the visualized upper cervical spine unremarkable. Bone marrow signal intensity within normal limits. No scalp soft tissue abnormality. Sinuses/Orbits: The globes and orbital soft tissues within normal limits. Paranasal sinuses are clear. No mastoid effusion. Inner ear structures normal. Other: None. IMPRESSION: 1. No acute intracranial abnormality identified. 2. Moderate cerebral atrophy. Electronically Signed   By: Rise Mu M.D.   On: 04/22/2017 04:55   Dg Femur Min 2 Views Left  Result Date: 04/22/2017 CLINICAL DATA:  Initial evaluation for acute trauma, fall. EXAM: LEFT FEMUR 2 VIEWS COMPARISON:  None. FINDINGS: No acute fracture or dislocation. Limited views of the left hip and knee are grossly unremarkable. No acute soft tissue abnormality. Diffuse osteopenia noted. IMPRESSION: No acute osseous abnormality about the left femur. Electronically Signed   By: Rise Mu M.D.   On: 04/22/2017 04:57    2D ECHO:   Disposition and Follow-up: Discharge Instructions    Diet - low sodium heart healthy    Complete by:  As directed    Increase activity slowly    Complete by:  As directed        DISPOSITION: home    DISCHARGE FOLLOW-UP Follow-up Information    Eustaquio Boyden, MD. Schedule an appointment as soon as possible for a visit in 2 week(s).   Specialty:  Family Medicine Contact information: [address] [place] [address] [phone]            Time spent on Discharge:   Signed:   Thad Ranger M.D. [facility] 04/23/2017, 11:11 AM Pager: [phone]

## 2017-04-23 NOTE — Progress Notes (Signed)
CSW acknowledging consult for skilled nursing placement, per PT recommendation. However, per MD report, the patient would prefer to go home instead. Skilled nursing placement not needed at this time.  CSW signing off.  Blenda Nicely, [place] Clinical Social Worker [phone]

## 2017-04-23 NOTE — Care Management CC44 (Signed)
Condition Code 44 Documentation Completed  Patient Details  Name: Kathryn Mathews MRN: [id number] Date of Birth: 1946/02/23   Condition Code 44 given:  Yes Patient signature on Condition Code 44 notice:  Yes Documentation of 2 MD's agreement:  Yes Code 44 added to claim:  Yes    Kermit Balo, RN 04/23/2017, 11:23 AM

## 2017-04-27 ENCOUNTER — Encounter: Payer: Commercial Managed Care - HMO | Admitting: Family Medicine

## 2017-04-28 ENCOUNTER — Ambulatory Visit (INDEPENDENT_AMBULATORY_CARE_PROVIDER_SITE_OTHER): Payer: Medicare HMO

## 2017-04-28 DIAGNOSIS — Z5181 Encounter for therapeutic drug level monitoring: Secondary | ICD-10-CM | POA: Diagnosis not present

## 2017-04-28 DIAGNOSIS — Z7901 Long term (current) use of anticoagulants: Secondary | ICD-10-CM

## 2017-04-28 DIAGNOSIS — I739 Peripheral vascular disease, unspecified: Secondary | ICD-10-CM

## 2017-04-28 LAB — POCT INR: INR: 1.7

## 2017-04-29 ENCOUNTER — Ambulatory Visit (INDEPENDENT_AMBULATORY_CARE_PROVIDER_SITE_OTHER): Payer: Medicare HMO | Admitting: Family Medicine

## 2017-04-29 ENCOUNTER — Encounter: Payer: Self-pay | Admitting: Family Medicine

## 2017-04-29 VITALS — BP 112/60 | HR 68 | Temp 98.4°F | Wt 147.2 lb

## 2017-04-29 DIAGNOSIS — Z86718 Personal history of other venous thrombosis and embolism: Secondary | ICD-10-CM

## 2017-04-29 DIAGNOSIS — Z7901 Long term (current) use of anticoagulants: Secondary | ICD-10-CM | POA: Diagnosis not present

## 2017-04-29 DIAGNOSIS — Z23 Encounter for immunization: Secondary | ICD-10-CM | POA: Diagnosis not present

## 2017-04-29 DIAGNOSIS — F3177 Bipolar disorder, in partial remission, most recent episode mixed: Secondary | ICD-10-CM

## 2017-04-29 DIAGNOSIS — R296 Repeated falls: Secondary | ICD-10-CM

## 2017-04-29 DIAGNOSIS — R2689 Other abnormalities of gait and mobility: Secondary | ICD-10-CM

## 2017-04-29 NOTE — Assessment & Plan Note (Signed)
[facility]

## 2017-04-29 NOTE — Patient Instructions (Signed)
Pre visit review using our clinic review tool, if applicable. No additional management support is needed unless otherwise documented below in the visit note. 

## 2017-04-29 NOTE — Progress Notes (Signed)
BP 112/60 (BP Location: Left Arm, Patient Position: Sitting, Cuff Size: Normal)   Pulse 68   Temp 98.4 F (36.9 C) (Oral)   Wt 147 lb 4 oz (66.8 kg)   SpO2 97%   BMI 33.61 kg/m    CC: hosp f/u visit Subjective:    Patient ID: ZELAYA KULAS, female    DOB: 1946-01-04, 71 y.o.   MRN: [id number]  HPI: CALENE BARTOSCH is a 71 y.o. female presenting on 04/29/2017 for Hospitalization Follow-up (D/c from [facility] 04/16[facility]17)   Here with husband today.   She has started using walker regularly. She feels this is helping her steadiness.   Hospitalization last week after 4d h/o worsening gait instability culminating in fall. Evaluation included reassuring CT brain, MRI brain, and MRI lumbar spine. Lower extremity venous dopplers were normal as was L femur xray. No premonitory symptoms prior to fall. She had an episode where she was unable to move left leg. She may have had a bad reaction to abilify when she was back in [place].  Possible culprit was recent commencement of trazodone and abilify (per husband both were new additions). Trazodone was stopped. She uses trazodone PRN.  Discharged on tramadol and flexeril for muscle pain/aches. She is not regularly using this.   PT evaluated patient, rec SNF but pt declined this. HH PT/OT/aide arranged through [facility].   Saw Mandy yesterday - INR 1.7. Coumadin dose titrated. Planned recheck 3 wks.   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 R DVT x1, L recurrent DVT.   She had another ER visit 12/2016 due to acute episode of dizziness - thought URI related. MRI at that time was also unrevealing.   She did previously complete outpatient physical therapy fall prevention program 08/2016.   Denies alcohol use.   Admit date: 04/21/2017 Discharge date: 04/23/2017 No f/u phone call performed  Discharge Diagnoses:     Fall with gait instability . Insomnia . Hypothyroid .  Bipolar disorder (HCC)   History of DVT on Coumadin   Chronic back pain  Consults:  none  Recommendations for Outpatient Follow-up:  1. Patient was recommended skilled nursing acility for rehabilitation however she refused. Home health PT, OT, home health aide was arranged by the case management. 2. Please repeat CBC/BMET at next visit  DIET: heart healthy diet  Relevant past medical, surgical, family and social history reviewed and updated as indicated. Interim medical history since our last visit reviewed. Allergies and medications reviewed and updated. Outpatient Medications Prior to Visit  Medication Sig Dispense Refill  . acetaminophen (TYLENOL) 500 MG tablet Take 500 mg by mouth every 8 (eight) hours as needed for moderate pain.    Marland Kitchen atorvastatin (LIPITOR) 20 MG tablet Take 1 tablet (20 mg total) by mouth daily. 90 tablet 2  . Biotin 1000 MCG tablet Take 1 tablet (1 mg total) by mouth 2 (two) times daily. 180 tablet 3  . buPROPion (WELLBUTRIN SR) 200 MG 12 hr tablet Take 1 tablet (200 mg total) by mouth 2 (two) times daily. 180 tablet 1  . Cholecalciferol (VITAMIN D) 2000 units CAPS Take 1 capsule (2,000 Units total) by mouth daily. 90 capsule 3  . clonazePAM (KLONOPIN) 1 MG tablet Take 1 tablet (1 mg total) by mouth 2 (two) times daily. 180 tablet 1  . cyclobenzaprine (FLEXERIL) 5 MG tablet Take 1 tablet (5 mg total) by mouth 2 (two)  times daily. 60 tablet 0  . diclofenac sodium (VOLTAREN) 1 % GEL Apply 1 application topically 2 (two) times daily as needed. (Patient taking differently: Apply 1 application topically 2 (two) times daily as needed (pain). ) 1 Tube 1  . Docusate Calcium (STOOL SOFTENER PO) Take 1 tablet by mouth daily as needed (constipation). Reported on 11/05/2015    . lamoTRIgine (LAMICTAL) 100 MG tablet TAKE 1 TABLET 2 TIMES DAILY. (Patient taking differently: Take 100 mg by mouth 2 (two) times daily. ) 180 tablet 1  . levothyroxine (SYNTHROID, LEVOTHROID) 50 MCG  tablet TAKE 1 TABLET EVERY DAY 90 tablet 3  . PRESCRIPTION MEDICATION Place 2 drops into both eyes daily as needed (dryness). Fresh coat    . traMADol (ULTRAM) 50 MG tablet Take 1 tablet (50 mg total) by mouth 2 (two) times daily as needed for moderate pain. 20 tablet 0  . warfarin (COUMADIN) 5 MG tablet TAKE DAILY AS DIRECTED BY COUMADIN CLINIC. (Patient taking differently: Take 5 mg by mouth daily. ) 90 tablet 0   No facility-administered medications prior to visit.      Per HPI unless specifically indicated in ROS section below Review of Systems     Objective:    BP 112/60 (BP Location: Left Arm, Patient Position: Sitting, Cuff Size: Normal)   Pulse 68   Temp 98.4 F (36.9 C) (Oral)   Wt 147 lb 4 oz (66.8 kg)   SpO2 97%   BMI 33.61 kg/m   Wt Readings from Last 3 Encounters:  04/29/17 147 lb 4 oz (66.8 kg)  04/21/17 145 lb (65.8 kg)  03/31/17 145 lb 3.2 oz (65.9 kg)    Physical Exam  Constitutional: She appears well-developed and well-nourished. No distress.  HENT:  Mouth/Throat: Oropharynx is clear and moist. No oropharyngeal exudate.  Cardiovascular: Normal rate, regular rhythm, normal heart sounds and intact distal pulses.   No murmur heard. Pulmonary/Chest: Effort normal and breath sounds normal. No respiratory distress. She has no wheezes. She has no rales.  Musculoskeletal: She exhibits no edema.  Neurological: She displays a negative Romberg sign. Coordination and gait normal.  Slowed gait but steady using walker  Nursing note and vitals reviewed.  Results for orders placed or performed in visit on 04/28/17  POCT INR  Result Value Ref Range   INR 1.7       Assessment & Plan:   Problem List Items Addressed This Visit    Bipolar disorder [facility])    Appreciate psych care. Upcoming appt 09/2017.       History of DVT (deep vein thrombosis)    See above re [facility] discussion.      Imbalance    She is now off abilify - this may have contributed to imbalance.  I  encouraged decreased trazodone dosing.  Encouraged constant walker use to help stability.       Long term (current) use of anticoagulants    Reviewed coumadin vs NOAC vs off AC and just aspirin options. Discussed pros and cons of each, risks and benefits. Pt desires to stay with coumadin at this time.       Recurrent falls - Primary    Recent recurrent fall and left leg weakness, hospital workup unrevealing. HH PT/OT/aide set up  Encouraged continued walker use.           Follow up plan: Return if symptoms worsen or fail to improve.  Eustaquio Boyden, MD

## 2017-04-29 NOTE — Assessment & Plan Note (Addendum)
She is now off abilify - this may have contributed to imbalance.  I encouraged decreased trazodone dosing.  Encouraged constant walker use to help stability.

## 2017-04-29 NOTE — Assessment & Plan Note (Signed)
Recent recurrent fall and left leg weakness, hospital workup unrevealing. HH PT/OT/aide set up  Encouraged continued walker use.

## 2017-04-29 NOTE — Patient Instructions (Addendum)
Flu shot today Continue using the walker - this will help prevent falls.  We will continue coumadin for now.  Ok to try trazodone for sleep, but take 1/2 tablet at a time, don't mix with flexeril.  Stay off abilify (aripiprazole).

## 2017-04-29 NOTE — Addendum Note (Signed)
Addended by: Nanci PinaGOINS, Monifah Freehling on: 04/29/2017 03:57 PM   Modules accepted: Orders

## 2017-04-29 NOTE — Assessment & Plan Note (Signed)
Reviewed coumadin vs NOAC vs off AC and just aspirin options. Discussed pros and cons of each, risks and benefits. Pt desires to stay with coumadin at this time.

## 2017-04-29 NOTE — Assessment & Plan Note (Signed)
Appreciate psych care. Upcoming appt 09/2017.

## 2017-04-30 DIAGNOSIS — F419 Anxiety disorder, unspecified: Secondary | ICD-10-CM | POA: Diagnosis not present

## 2017-04-30 DIAGNOSIS — I10 Essential (primary) hypertension: Secondary | ICD-10-CM | POA: Diagnosis not present

## 2017-04-30 DIAGNOSIS — F319 Bipolar disorder, unspecified: Secondary | ICD-10-CM | POA: Diagnosis not present

## 2017-04-30 DIAGNOSIS — I739 Peripheral vascular disease, unspecified: Secondary | ICD-10-CM | POA: Diagnosis not present

## 2017-04-30 DIAGNOSIS — E039 Hypothyroidism, unspecified: Secondary | ICD-10-CM | POA: Diagnosis not present

## 2017-04-30 DIAGNOSIS — Z9181 History of falling: Secondary | ICD-10-CM | POA: Diagnosis not present

## 2017-04-30 DIAGNOSIS — R296 Repeated falls: Secondary | ICD-10-CM | POA: Diagnosis not present

## 2017-04-30 DIAGNOSIS — R269 Unspecified abnormalities of gait and mobility: Secondary | ICD-10-CM | POA: Diagnosis not present

## 2017-04-30 DIAGNOSIS — Z7901 Long term (current) use of anticoagulants: Secondary | ICD-10-CM | POA: Diagnosis not present

## 2017-05-04 DIAGNOSIS — Z9181 History of falling: Secondary | ICD-10-CM | POA: Diagnosis not present

## 2017-05-04 DIAGNOSIS — I739 Peripheral vascular disease, unspecified: Secondary | ICD-10-CM | POA: Diagnosis not present

## 2017-05-04 DIAGNOSIS — R269 Unspecified abnormalities of gait and mobility: Secondary | ICD-10-CM | POA: Diagnosis not present

## 2017-05-04 DIAGNOSIS — F319 Bipolar disorder, unspecified: Secondary | ICD-10-CM | POA: Diagnosis not present

## 2017-05-04 DIAGNOSIS — F419 Anxiety disorder, unspecified: Secondary | ICD-10-CM | POA: Diagnosis not present

## 2017-05-04 DIAGNOSIS — E039 Hypothyroidism, unspecified: Secondary | ICD-10-CM | POA: Diagnosis not present

## 2017-05-04 DIAGNOSIS — I10 Essential (primary) hypertension: Secondary | ICD-10-CM | POA: Diagnosis not present

## 2017-05-04 DIAGNOSIS — R296 Repeated falls: Secondary | ICD-10-CM | POA: Diagnosis not present

## 2017-05-04 DIAGNOSIS — Z7901 Long term (current) use of anticoagulants: Secondary | ICD-10-CM | POA: Diagnosis not present

## 2017-05-06 DIAGNOSIS — R269 Unspecified abnormalities of gait and mobility: Secondary | ICD-10-CM | POA: Diagnosis not present

## 2017-05-06 DIAGNOSIS — I739 Peripheral vascular disease, unspecified: Secondary | ICD-10-CM | POA: Diagnosis not present

## 2017-05-06 DIAGNOSIS — F319 Bipolar disorder, unspecified: Secondary | ICD-10-CM | POA: Diagnosis not present

## 2017-05-06 DIAGNOSIS — Z9181 History of falling: Secondary | ICD-10-CM | POA: Diagnosis not present

## 2017-05-06 DIAGNOSIS — R296 Repeated falls: Secondary | ICD-10-CM | POA: Diagnosis not present

## 2017-05-06 DIAGNOSIS — Z7901 Long term (current) use of anticoagulants: Secondary | ICD-10-CM | POA: Diagnosis not present

## 2017-05-06 DIAGNOSIS — F419 Anxiety disorder, unspecified: Secondary | ICD-10-CM | POA: Diagnosis not present

## 2017-05-06 DIAGNOSIS — I10 Essential (primary) hypertension: Secondary | ICD-10-CM | POA: Diagnosis not present

## 2017-05-06 DIAGNOSIS — E039 Hypothyroidism, unspecified: Secondary | ICD-10-CM | POA: Diagnosis not present

## 2017-05-08 DIAGNOSIS — R296 Repeated falls: Secondary | ICD-10-CM | POA: Diagnosis not present

## 2017-05-08 DIAGNOSIS — F419 Anxiety disorder, unspecified: Secondary | ICD-10-CM | POA: Diagnosis not present

## 2017-05-08 DIAGNOSIS — E039 Hypothyroidism, unspecified: Secondary | ICD-10-CM | POA: Diagnosis not present

## 2017-05-08 DIAGNOSIS — R269 Unspecified abnormalities of gait and mobility: Secondary | ICD-10-CM | POA: Diagnosis not present

## 2017-05-08 DIAGNOSIS — F319 Bipolar disorder, unspecified: Secondary | ICD-10-CM | POA: Diagnosis not present

## 2017-05-08 DIAGNOSIS — I739 Peripheral vascular disease, unspecified: Secondary | ICD-10-CM | POA: Diagnosis not present

## 2017-05-08 DIAGNOSIS — I10 Essential (primary) hypertension: Secondary | ICD-10-CM | POA: Diagnosis not present

## 2017-05-08 DIAGNOSIS — Z7901 Long term (current) use of anticoagulants: Secondary | ICD-10-CM | POA: Diagnosis not present

## 2017-05-08 DIAGNOSIS — Z9181 History of falling: Secondary | ICD-10-CM | POA: Diagnosis not present

## 2017-05-11 DIAGNOSIS — E039 Hypothyroidism, unspecified: Secondary | ICD-10-CM | POA: Diagnosis not present

## 2017-05-11 DIAGNOSIS — Z7901 Long term (current) use of anticoagulants: Secondary | ICD-10-CM | POA: Diagnosis not present

## 2017-05-11 DIAGNOSIS — F419 Anxiety disorder, unspecified: Secondary | ICD-10-CM | POA: Diagnosis not present

## 2017-05-11 DIAGNOSIS — R269 Unspecified abnormalities of gait and mobility: Secondary | ICD-10-CM | POA: Diagnosis not present

## 2017-05-11 DIAGNOSIS — I10 Essential (primary) hypertension: Secondary | ICD-10-CM | POA: Diagnosis not present

## 2017-05-11 DIAGNOSIS — Z9181 History of falling: Secondary | ICD-10-CM | POA: Diagnosis not present

## 2017-05-11 DIAGNOSIS — R296 Repeated falls: Secondary | ICD-10-CM | POA: Diagnosis not present

## 2017-05-11 DIAGNOSIS — F319 Bipolar disorder, unspecified: Secondary | ICD-10-CM | POA: Diagnosis not present

## 2017-05-11 DIAGNOSIS — I739 Peripheral vascular disease, unspecified: Secondary | ICD-10-CM | POA: Diagnosis not present

## 2017-05-12 DIAGNOSIS — H2510 Age-related nuclear cataract, unspecified eye: Secondary | ICD-10-CM | POA: Diagnosis not present

## 2017-05-12 DIAGNOSIS — E7800 Pure hypercholesterolemia, unspecified: Secondary | ICD-10-CM | POA: Diagnosis not present

## 2017-05-12 DIAGNOSIS — H57059 Tonic pupil, unspecified eye: Secondary | ICD-10-CM | POA: Diagnosis not present

## 2017-05-12 DIAGNOSIS — Z0100 Encounter for examination of eyes and vision without abnormal findings: Secondary | ICD-10-CM | POA: Diagnosis not present

## 2017-05-13 DIAGNOSIS — E039 Hypothyroidism, unspecified: Secondary | ICD-10-CM | POA: Diagnosis not present

## 2017-05-13 DIAGNOSIS — I10 Essential (primary) hypertension: Secondary | ICD-10-CM | POA: Diagnosis not present

## 2017-05-13 DIAGNOSIS — F419 Anxiety disorder, unspecified: Secondary | ICD-10-CM | POA: Diagnosis not present

## 2017-05-13 DIAGNOSIS — I739 Peripheral vascular disease, unspecified: Secondary | ICD-10-CM | POA: Diagnosis not present

## 2017-05-13 DIAGNOSIS — Z9181 History of falling: Secondary | ICD-10-CM | POA: Diagnosis not present

## 2017-05-13 DIAGNOSIS — F319 Bipolar disorder, unspecified: Secondary | ICD-10-CM | POA: Diagnosis not present

## 2017-05-13 DIAGNOSIS — R296 Repeated falls: Secondary | ICD-10-CM | POA: Diagnosis not present

## 2017-05-13 DIAGNOSIS — R269 Unspecified abnormalities of gait and mobility: Secondary | ICD-10-CM | POA: Diagnosis not present

## 2017-05-13 DIAGNOSIS — Z7901 Long term (current) use of anticoagulants: Secondary | ICD-10-CM | POA: Diagnosis not present

## 2017-05-15 DIAGNOSIS — Z9181 History of falling: Secondary | ICD-10-CM | POA: Diagnosis not present

## 2017-05-15 DIAGNOSIS — F419 Anxiety disorder, unspecified: Secondary | ICD-10-CM | POA: Diagnosis not present

## 2017-05-15 DIAGNOSIS — I739 Peripheral vascular disease, unspecified: Secondary | ICD-10-CM | POA: Diagnosis not present

## 2017-05-15 DIAGNOSIS — R269 Unspecified abnormalities of gait and mobility: Secondary | ICD-10-CM | POA: Diagnosis not present

## 2017-05-15 DIAGNOSIS — I10 Essential (primary) hypertension: Secondary | ICD-10-CM | POA: Diagnosis not present

## 2017-05-15 DIAGNOSIS — Z7901 Long term (current) use of anticoagulants: Secondary | ICD-10-CM | POA: Diagnosis not present

## 2017-05-15 DIAGNOSIS — R296 Repeated falls: Secondary | ICD-10-CM | POA: Diagnosis not present

## 2017-05-15 DIAGNOSIS — F319 Bipolar disorder, unspecified: Secondary | ICD-10-CM | POA: Diagnosis not present

## 2017-05-15 DIAGNOSIS — E039 Hypothyroidism, unspecified: Secondary | ICD-10-CM | POA: Diagnosis not present

## 2017-05-18 DIAGNOSIS — F319 Bipolar disorder, unspecified: Secondary | ICD-10-CM | POA: Diagnosis not present

## 2017-05-18 DIAGNOSIS — R269 Unspecified abnormalities of gait and mobility: Secondary | ICD-10-CM | POA: Diagnosis not present

## 2017-05-18 DIAGNOSIS — Z9181 History of falling: Secondary | ICD-10-CM | POA: Diagnosis not present

## 2017-05-18 DIAGNOSIS — I10 Essential (primary) hypertension: Secondary | ICD-10-CM | POA: Diagnosis not present

## 2017-05-18 DIAGNOSIS — I739 Peripheral vascular disease, unspecified: Secondary | ICD-10-CM | POA: Diagnosis not present

## 2017-05-18 DIAGNOSIS — E039 Hypothyroidism, unspecified: Secondary | ICD-10-CM | POA: Diagnosis not present

## 2017-05-18 DIAGNOSIS — R296 Repeated falls: Secondary | ICD-10-CM | POA: Diagnosis not present

## 2017-05-18 DIAGNOSIS — F419 Anxiety disorder, unspecified: Secondary | ICD-10-CM | POA: Diagnosis not present

## 2017-05-18 DIAGNOSIS — Z7901 Long term (current) use of anticoagulants: Secondary | ICD-10-CM | POA: Diagnosis not present

## 2017-05-19 ENCOUNTER — Ambulatory Visit (INDEPENDENT_AMBULATORY_CARE_PROVIDER_SITE_OTHER): Payer: Medicare HMO

## 2017-05-19 DIAGNOSIS — Z7901 Long term (current) use of anticoagulants: Secondary | ICD-10-CM | POA: Diagnosis not present

## 2017-05-19 DIAGNOSIS — I739 Peripheral vascular disease, unspecified: Secondary | ICD-10-CM

## 2017-05-19 LAB — POCT INR: INR: 1.3

## 2017-05-19 NOTE — Patient Instructions (Signed)
Pre visit review using our clinic review tool, if applicable. No additional management support is needed unless otherwise documented below in the visit note. 

## 2017-05-20 ENCOUNTER — Other Ambulatory Visit: Payer: Self-pay

## 2017-05-20 ENCOUNTER — Ambulatory Visit (INDEPENDENT_AMBULATORY_CARE_PROVIDER_SITE_OTHER): Payer: Medicare HMO

## 2017-05-20 VITALS — BP 110/78 | HR 67 | Temp 98.8°F | Ht <= 58 in | Wt 146.5 lb

## 2017-05-20 DIAGNOSIS — F419 Anxiety disorder, unspecified: Secondary | ICD-10-CM | POA: Diagnosis not present

## 2017-05-20 DIAGNOSIS — R269 Unspecified abnormalities of gait and mobility: Secondary | ICD-10-CM | POA: Diagnosis not present

## 2017-05-20 DIAGNOSIS — E785 Hyperlipidemia, unspecified: Secondary | ICD-10-CM | POA: Diagnosis not present

## 2017-05-20 DIAGNOSIS — E039 Hypothyroidism, unspecified: Secondary | ICD-10-CM | POA: Diagnosis not present

## 2017-05-20 DIAGNOSIS — I739 Peripheral vascular disease, unspecified: Secondary | ICD-10-CM | POA: Diagnosis not present

## 2017-05-20 DIAGNOSIS — Z9181 History of falling: Secondary | ICD-10-CM | POA: Diagnosis not present

## 2017-05-20 DIAGNOSIS — E559 Vitamin D deficiency, unspecified: Secondary | ICD-10-CM

## 2017-05-20 DIAGNOSIS — I10 Essential (primary) hypertension: Secondary | ICD-10-CM | POA: Diagnosis not present

## 2017-05-20 DIAGNOSIS — R296 Repeated falls: Secondary | ICD-10-CM | POA: Diagnosis not present

## 2017-05-20 DIAGNOSIS — Z0000 Encounter for general adult medical examination without abnormal findings: Secondary | ICD-10-CM

## 2017-05-20 DIAGNOSIS — Z7901 Long term (current) use of anticoagulants: Secondary | ICD-10-CM | POA: Diagnosis not present

## 2017-05-20 DIAGNOSIS — F319 Bipolar disorder, unspecified: Secondary | ICD-10-CM | POA: Diagnosis not present

## 2017-05-20 LAB — LIPID PANEL
CHOLESTEROL: 167 mg/dL (ref 0–200)
HDL: 44.2 mg/dL (ref >39.00)
LDL CALC: 99 mg/dL (ref 0–99)
NonHDL: 122.75
TRIGLYCERIDES: 118 mg/dL (ref 0.0–149.0)
Total CHOL/HDL Ratio: 4
VLDL: 23.6 mg/dL (ref 0.0–40.0)

## 2017-05-20 LAB — TSH: TSH: 1.9 u[IU]/mL (ref 0.35–4.50)

## 2017-05-20 LAB — VITAMIN D 25 HYDROXY (VIT D DEFICIENCY, FRACTURES): VITD: 25.59 ng/mL — ABNORMAL LOW (ref 30.00–100.00)

## 2017-05-20 MED ORDER — LEVOTHYROXINE SODIUM 50 MCG PO TABS: 50.0000 ug | ORAL_TABLET | Freq: Every day | ORAL | 3 refills | Status: DC

## 2017-05-20 MED ORDER — WARFARIN SODIUM 5 MG PO TABS: ORAL_TABLET | ORAL | 1 refills | Status: DC

## 2017-05-20 NOTE — Progress Notes (Signed)
Pre visit review using our clinic review tool, if applicable. No additional management support is needed unless otherwise documented below in the visit note. 

## 2017-05-20 NOTE — Progress Notes (Signed)
I reviewed health advisor's note, was available for consultation, and agree with documentation and plan.  

## 2017-05-20 NOTE — Telephone Encounter (Signed)
Patient needs refills on Levothyroxine and Warfarin medications.  She is compliant with coumadin management - will refill warfarin X 88mont319 Kathryn Mathews 4655w5d2Eartha [facility]587-1Merton BorNatalia Sharm95ShaTexaHarmon KirsAlta Bates Summit Med Ctr-Summit Ca914NProvidence Portland MedBatBarbi54e Alr(778)52Sherene Sire47899Southeast [place] MenKentuckytal 409Maura NichoVe41rgia 53Chi St Alexius HealthYetta Mathews 47month519-Kathryn Mathews 5333w5d4Eartha [facility] Cente(631) 2Merton BorNHarmon KirsWestwood/Pembroke Health System Advanced FamiKentuckyly S409Maura NichoVe10rgia 130[facility] Sacred Heart HospitalYetta Mathews 79month616-Kathryn Mathews 9791w5d4Eartha [facility]510-50Merton BorNatalia Sharm55ShaTexasrolKutztown U54niveHarmon KirsAcuity Special914NChestTahoKentuckye Fo409Maura NichoVe89rgia 261[facility] Kathryn Mathews 32month(917)Kathryn Mathews ([phone]) 33w5d3Eartha [facility]930 6Merton BorNatalia Sharm85ShaTexasr68olBeHarmon KirsVision One Laser And Surgery91KentuckyMemo409Maura NichoVe63rgia16 [facility] Fort WorYetta Mathews 57month(949) Kathryn Mathews 681 431w5d5Eartha [facility] LL380-44Merton BorNatalia Sharm59ShaTexa25srolKentuckHarmon KirsBon Secours Maryview Medical 914Specialty Hospital KentuckyOf C409Maura NichoVe16rgia 64[facility] Kathryn Mathews 27month904-Kathryn Mathews 984-50w5d2Eartha [facility](308)107Merton BorNatalia Sharm75ShaTexasrolSe2archKentuckHarmon KirsBascom Palmer Surgery914NFultonKaiser Fnd HosKentuckyp - 409Maura NichoVe53rgia 77Health[facility] Of NortherYetta Mathews 5month432-Kathryn Mathews [phone] [facility] Campu(714) 2Merton BorNatalia Sharm67ShaTexasrolC69eHarmon KirsElite Surge914NGaSouthwestern MKentuckyedic409Maura NichoVe32rgia 42Uw Medicine NorthwesYetta Mathews 42month(236) Kathryn Mathews 660-57w5d1Eartha [facility](571) 66Merton BorNatalia Sharm48ShaTexa36srolKentuckHarmon KirsStaffDe QueKentuckyen M409Maura NichoVe43rgia 474Canton Eye SurgYetta Mathews 65month(337)Kathryn Mathews ([phone])71w5d3Eartha [facility]-Mai918-31Merton BorNatalia Sharm60ShaTexasrolBlo78oHarmon KirsNew Horizons Surgery Center L914NGrant MSt Marys SuKentuckyrgic409Maura NichoVe67rgia 705SaratogYetta Mathews 43month904-Kathryn Mathews 425([redacted]w[redacted]d)Kathryn Mathews [facility](928) 2Merton BorNatalia SHarmon KirsBigfor914TelecaKentuckyre S409Maura NichoVe25rgia 673Imperial Calcasieu SurgiYetta Mathews 64month224-Kathryn Mathews (408)0(35w5dEartha [facility](201)65Merton BorNatalia Sharm45ShaTHarmon KirsRogers Mem H914NHouston Methodist Sugar LaHBarbi10eMission KentuckyHosp409Maura NichoVe69rgia 53Bon Secours CommunitYetta Mathews 59month(208)Kathryn Mathews 254-38w5dEartha [facility] In(380)51Merton BorNatalia Sharm15ShaTexHarmon KirsNovi Surgery CenteElvJackKentuckyson 409Maura NichoVe32rgia 845Atrium HeaYetta Mathews 10month(820)Kathryn Mathews 463-82w5d8Eartha [facility] [place] [facility]e(401)3Merton BorNatalia Sharm81ShaTexas22rolFKentuckyre8111-6Trusted Medical CenHarmon KirsCoastal Surgery Center914NDigeDelmar SuKentuckyrgic409Maura NichoVe75rgia 298Youth Villages - Inner HarbYetta Mathews 62month332 Kathryn Mathews 2171w5d2Eartha [facility](980)27Merton Harmon KirsThe Ridge Behavioral Health SysteElv914NIu Health East Washington AmbulatorSouKentuckyth S409Maura NichoVe8rgia 6Yetta Mathews 65month22Carola 304-83w5d5Eartha [facility] West-Davenpor920-77Merton BorNataliaHarmon KirsS914NMountain Vista Avera Saint BenedChildren'S RehabiliAllKatin145Lee Regional Me16month(315)Kathryn Mathews 581-66w5d4Eartha [facility](947) 58Merton BorNatalia ShHarmon KirsEndoscopy Cons914Baptist KentuckyHeal409Maura NichoVe60rgia 3Weirton MediYetta Mathews 57month(450)Kathryn Mathews 678 59w5d9Eartha [facility]ita6073Merton BorNatalia Sharm38ShaTex32asroKentuHarmon KirsDoctSt Lukes SuKentuckyrgic409Maura NichoVe70rgia 739Arn[facility] FoYetta Mathews 65month(262) Kathryn Mathews 425-52w5d9Eartha Behavioral Medicine At Renaissanc(732) 58Merton BorNatalHarmon KirsSelect Specialty Hospital - Ann ArboE914NWest [place] Healthcare DyersbuNorthern Nj EndKentuckyosco409Maura NichoVe58rgia 95North Canyon MediYetta Mathews 81month540 Kathryn Mathews (270) 46w5d5Eartha [facility] Hospita847-65Merton BorNatalia Sharm45ShaTexasHarmon KirsSpartanburg RehabilitaInovaKentucky Lou409Maura NichoVe75rgia 725Hiawatha CommunitYetta Mathews 72month(520)Kathryn Mathews 272-6([redacted]w[redacted]d)Kathryn Mathews [facility] Medical Cente202-80Merton BorNatalia Sharm37ShaTexasrHarmon KirsGuilord Endoscopy CenteElvi(820WUJWJ'Mal6The S914NMercy PhiAbington KentuckyMemo409Maura NichoVe41rgia 994Research Surgical Kathryn Mathews 55month269-Kathryn Mathews 3556w5d2Eartha [facility] LL730Merton BorNatalia Sharm41ShaTexas76rolSKentuHarmon KirsCataract And 914NHattiesburg Clinic AmSurgcenterKentucky Of 409Maura NichoVe24rgia 715Marshfeild MediYetta Mathews 39month516-Kathryn Mathews 8377w5d9Eartha [facility]ospita(581) 3Merton BorNatalia Sharm6Harmon KirsSouth Big Horn County Critical Acces914NMemorial Hermann EndoscoRivendell BehavioraKentuckyl He409Maura NichoVe20rgia 414Blythedale Children'Kathryn Mathews 71month548-Kathryn Mathews 581-26w5d1Eartha [facility] Huntersvill6080Merton BorNatalia Sharm42ShaTexasrHarmon KirsStormo914Surgery CeKentuckynter409Maura NichoVe3rgia 295[facility] - FrankYetta Mathews 1month(339)Kathryn Mathews [phone] The Endoscopy Center LL602-4Merton BorNatalia Sharm84SHarmon KirsWalde914NUs Phs Winslow IndiBarbi71e AlrMahnoKentuckymen 409Maura NichoVe29rgia 224Wamego HeaYetta Mathews 26month(225)Kathryn Mathews 903-0([redacted]w[redacted]d)Kathryn Mathews [facility] In415-56Merton BorNatalia Sharm44ShaTexa70sHarmon KirsMercy Hospital And Me914NCedar-Sinai Marina Del Laurel Surgery And EndKentuckyosco409Maura NichoVe32rgia 784Baxter Regional MediYetta Mathews 76La77Carola 272-8970Eartha Inc472Merton BordSharm86SharoHarmon KirsKindred HospitalBellin Health Marinette S409Maura KVe72rgia 344GriffYetta Bar54month(815)Kathryn Mathews (4413w5d6Eartha [facility] Institut2232Merton BorNatalia Sharm92ShaTexasrol29St. KentuckyDa8111-6Harmon KirsBon Secours-St Fr914NBaylor Institute For RehabilitHarlingen SuKentuckyrgic409Maura NichoVe33rgia 74[facility] Kathryn Mathews 76month223-Kathryn Mathews (407)69w5d7Eartha [facility]ospita917-84Merton BorNatalia Sharm37ShaTeHarmon KirsNorth [place] State H914NShriners' HospitalYorkKentucky Gen409Maura NichoVe34rgia 234[facility] PrYetta Mathews 27month6603-28w5Eartha Bakersfield SpecialistHarmon KirsThe Hospitals Of Providence Transmountain CampuElvi85WUJWJ'Mal6Sonora Eye SurgerKentuckAlfredia ClientAdriana Sim914NSandy Pines PsychiatrCoalBarbi97eAlr21Lake AmbulatKentuckyory NichoVe71rgi65aHighlands-CashierYetta Mathews 66month202-Kathryn Mathews (9370w5d6Eartha [facility](234)7Merton BorNatalia Sharm43ShaTexasrolFl28owerKentucky H8111-6AdvocaHarmon KirsVidant Medical Group Dba Vidant Endoscopy Center KinEncompass Health Rehabilitation HospitKentuckyal O409Maura NichoVe71rgia 633Cor[facility] LYetta Mathews 80month405-Kathryn Mathews 862-76w5d2Eartha Memorial Herm[facility] Hospita785 4Merton BorNataHarmon KirsBaptist Memorial Hospital-BoonevillE914NMPerry CountyKentucky Gen409Maura NichoVe59rgia 577Phillips CountYetta Mathews 26month608-Kathryn Mathews (786) 74w5d7Eartha Encompass Health Rehabilitation Of Scottsdal774-88Merton BorNatalia Sharm54ShaTexasrolMer29riHarmon KirsO'Connor914NVa MedicaVolusia Endoscopy AKentuckynd S409Maura NichoVe6rgi7AllKati33month365-Kathryn Mathews (450) 50w5d0Eartha [facility] D[facility] LL347-80Merton BorNatalia Sharm66ShaTexasro31lFraKentuckynk8111-6[facility] OHarmon KirsRusk Rehab Center, A Jv Of Health914NVa Eastern C(The PalmetKentuckyto S409Maura NichoVe52rgia 608Saint [facility] MediYetta BOscar45month782-Kathryn Mathews (279)16w5d0Eartha [facility](870) 68Merton BorNatalia Sharm78ShaTexaHarmon KirsCrisp Regiona91Digestive HealtKentuckyh Ce409Maura NichoVe75rgia 33Bayhealth Kent GeneraYetta Mathews 16month(573)Kathryn Mathews 908-33w5d9Eartha [facility]ospita971-32Merton Harmon KirsGraystone Eye Surgery Center LLE9Goleta ValleyKentucky Cot409Maura NichoVe13rgia 81Emory DecatuYetta Mathews 76month98Carola 236-74w5d4Eartha Coffee Regional Medical Cente(236)7Merton BorNatalia Sharm15ShaTexaHarmon KirsMidvalley Ambulatory S914St Vincent Seton Specialty HKentuckyospi409Maura NichoVe21rgia 41[place] MediYetta Mathews 65month(931)Kathryn Mathews 619-78w5d4Eartha Sarasota Phyiscians Surgical Cente337-75Merton BorNatalia Sharm23ShaTexasrHarmon KirsWest Tennessee Healthcare Rehabilitation 914NHoGastroenterology Of Canton Endoscopy Center Inc Dba GocKentucky End409Maura NichoVe65rgia 494Grand Island SurgYetta Mathews 68month(367)Kathryn Mathews 413 64w5d7Eartha Ac[facility] Texa605 72Merton BorNataliaHarmon KirsPenn Highlands HuntingdoElvi43WUJWJ'Mal6St. John914NBeaumoLe Bonheur ChKentuckyildr409Maura NichoVe52rgia 194AdventhealtYetta Mathews 80month(838)Kathryn Mathews 407-620w5d7Eartha [facility] Lt636-Merton BorNatalia Sharm17ShaTexHarmon KirsTampa Va M914NEndoscopyBaylor EmergenKentuckycy M409Maura NichoVe82rgia 51[place] Diagnostic Kathryn Mathews 57m74Wca HospNataliTWUJAlfredia Cl914Barbie Levo204-144PakisV48Catha GosKentuckysNichOsca27month859-Kathryn Mathews 774-149w5d5Eartha [facility] - [facility] Springs Campu412-66Merton BorNatalia Sharm29ShaTexasr2olDrKentHarmon KirsSurgery Centre Of Sw F914NPheLPs Memorial HosBarbi72e Alr360-61Sherene S23 ElStormonKentuckyt Va409Maura NichoVe68rgia 6Christiana Care-WilmingtoYetta Mathews 59month(614)Kathryn Mathews 217-48w5d3Eartha [facility]ospita941-59Merton BorNatalia Sharm22ShaTexas44rolMKentHarmon KirsRobert E.914NSouthCarolinKentuckyas M409Maura NichoVe52rgia 247[place] BehavioYetta Mathews 3month(670)Kathryn Mathews [phone] [facility] L(845) 8Merton BorNatalia Sharm8ShaTexasrolBHarmon KirsNorth Star Hospital - 914NNew PortDakota GasKentuckytroe409Maura NichoVe64rgia 243[facility] Kathryn Mathews 83month561-Kathryn Mathews 731-6([redacted]w[redacted]d)Kathryn Mathews [facility] Dakota516 8Merton BorNatalia Sharm84ShaTexasr77olAnKentuHarmon KirsCook Hospita914NUspi Memorial SurBaAurora ShebKentuckyoyga409Maura NichoVe48rgia 13Knox CommunitYetta Mathews 74month515-Kathryn Mathews 2467w5d6Eartha Providence Hospita224Merton BorNatalia Sharm28ShaTexasrolCroo65ked KentuHarmon KirsGateway Ambulatory Surgery Ce914NJones RegCapital HeaKentuckylth 409Maura NichoVe56rgia 637Ophthalmology Ltd Eye Surgery Kathryn Mathews 67month734-Kathryn Mathews (571) 85w5d4Eartha [facility] Institut(848) 81Merton BorNatalia Sharm68ShaTexasHarmon KirsLivingstMaine CenteKentuckyrs F409Maura NichoVe54rgia38 Prg DalYetta Mathews 30month445-Kathryn Mathews 787 81w5d4Eartha [facility] A Medical Corporatio(450)30Merton BorNatalia SHarmon KirsRiver Rd Surgery CenteE914NNew York Eye A(East [place] ChKentuckyildr409Maura Nicho3AllKatin4month279-Kathryn Mathews 206110w5d7Eartha Upmc Altoon819-52Merton BorNatalia Sharm48ShaTeHarmon KirsMoundview Mem Hsptl And 914NBogalusa - AmCommunity Hospital KentuckyOf S409Maura NichoVe55rgia 261[facility] Kathryn Mathews 16month36Carola 959 8([redacted]w[redacted]d)Kathryn Mathews [facility] LL226 3Merton BorNatalia Sharm49ShaTexaHarmon KirsBrookstone914NOceans Behavioral Hospital Of NorthBarbi55e Alr9067Sherene SiSurgKentuckycent409Maura NichoVe2rgia 895Glen Echo SurgYetta Mathews 70month212-Kathryn Mathews ([phone])32w5d8Eartha Tristate Surgery Ct857-82Merton BorNatalia Sharm17ShaTexasHarmon KirsMemphis Surger914NPeacehealth SouthGranviKentuckylle 409Maura NichoVe70rgia 77Nmc Surgery Center LP Dba The Surgery Center Of NYetta Mathews 16LaogdochesyPickerelr978-53Sherene Sire4098875 GaElJaCarroll Count(26Catha Gosselin11WonLa >8.  Will refill Levothyroxine X 1 year.

## 2017-05-20 NOTE — Telephone Encounter (Signed)
Levothyroxine initially sent in; however, I noticed that while patient was in hospital last month her TSH was significantly elevated.  Recheck of TSH was performed in the office today and patient has follow up with MD next week.  I called and cancelled the levothyroxine R/X due to likely need that medication may need adjustment.  Patient currently has enough supply and is aware to speak with Dr. About this next week at appointment so correct dosing will be sent in.

## 2017-05-20 NOTE — Patient Instructions (Signed)
Kathryn Mathews , Thank you for taking time to come for your Medicare Wellness Visit. I appreciate your ongoing commitment to your health goals. Please review the following plan we discussed and let me know if I can assist you in the future.   These are the goals we discussed: Goals    Starting 05/20/2017, I will continue to use walker to reduce risks of falls.       This is a list of the screening recommended for you and due dates:  Health Maintenance  Topic Date Due  . Mammogram  05/19/2018*  . DTaP/Tdap/Td vaccine (1 - Tdap) 08/13/2021*  . Colon Cancer Screening  08/25/2018  . Tetanus Vaccine  08/13/2021  . Flu Shot  Completed  . DEXA scan (bone density measurement)  Completed  .  Hepatitis C: One time screening is recommended by Center for Disease Control  (CDC) for  adults born from [birth year] through 1965.   Completed  . Pneumonia vaccines  Completed  *Topic was postponed. The date shown is not the original due date.   Preventive Care for Adults  A healthy lifestyle and preventive care can promote health and wellness. Preventive health guidelines for adults include the following key practices.  . A routine yearly physical is a good way to check with your health care provider about your health and preventive screening. It is a chance to share any concerns and updates on your health and to receive a thorough exam.  . Visit your dentist for a routine exam and preventive care every 6 months. Brush your teeth twice a day and floss once a day. Good oral hygiene prevents tooth decay and gum disease.  . The frequency of eye exams is based on your age, health, family medical history, use  of contact lenses, and other factors. Follow your health care provider's recommendations for frequency of eye exams.  . Eat a healthy diet. Foods like vegetables, fruits, whole grains, low-fat dairy products, and lean protein foods contain the nutrients you need without too many calories. Decrease your intake of  foods high in solid fats, added sugars, and salt. Eat the right amount of calories for you. Get information about a proper diet from your health care provider, if necessary.  . Regular physical exercise is one of the most important things you can do for your health. Most adults should get at least 150 minutes of moderate-intensity exercise (any activity that increases your heart rate and causes you to sweat) each week. In addition, most adults need muscle-strengthening exercises on 2 or more days a week.  Silver Sneakers may be a benefit available to you. To determine eligibility, you may visit the website: www.silversneakers.com or contact program at [phone] Mon-Fri between 8AM-8PM.   . Maintain a healthy weight. The body mass index (BMI) is a screening tool to identify possible weight problems. It provides an estimate of body fat based on height and weight. Your health care provider can find your BMI and can help you achieve or maintain a healthy weight.   For adults 20 years and older: ? A BMI below 18.5 is considered underweight. ? A BMI of 18.5 to 24.9 is normal. ? A BMI of 25 to 29.9 is considered overweight. ? A BMI of 30 and above is considered obese.   . Maintain normal blood lipids and cholesterol levels by exercising and minimizing your intake of saturated fat. Eat a balanced diet with plenty of fruit and vegetables. Blood tests for lipids and cholesterol  should begin at age 71 and be repeated every 5 years. If your lipid or cholesterol levels are high, you are over 50, or you are at high risk for heart disease, you may need your cholesterol levels checked more frequently. Ongoing high lipid and cholesterol levels should be treated with medicines if diet and exercise are not working.  . If you smoke, find out from your health care provider how to quit. If you do not use tobacco, please do not start.  . If you choose to drink alcohol, please do not consume more than 2 drinks per  day. One drink is considered to be 12 ounces (355 mL) of beer, 5 ounces (148 mL) of wine, or 1.5 ounces (44 mL) of liquor.  . If you are 2655-71 years old, ask your health care provider if you should take aspirin to prevent strokes.  . Use sunscreen. Apply sunscreen liberally and repeatedly throughout the day. You should seek shade when your shadow is shorter than you. Protect yourself by wearing long sleeves, pants, a wide-brimmed hat, and sunglasses year round, whenever you are outdoors.  . Once a month, do a whole body skin exam, using a mirror to look at the skin on your back. Tell your health care provider of new moles, moles that have irregular borders, moles that are larger than a pencil eraser, or moles that have changed in shape or color.

## 2017-05-20 NOTE — Progress Notes (Signed)
Subjective:   Kathryn Mathews is a 71 y.o. female who presents for Medicare Annual (Subsequent) preventive examination.  Review of Systems:  N/A Cardiac Risk Factors include: advanced age (>54men, >27 women);obesity (BMI >30kg/m2);dyslipidemia     Objective:     Vitals: BP 110/78 (BP Location: Right Arm, Patient Position: Sitting, Cuff Size: Normal)   Pulse 67   Temp 98.8 F (37.1 C) (Oral)   Ht 4' 7.5" (1.41 m) Comment: no shoes  Wt 146 lb 8 oz (66.5 kg)   SpO2 95%   BMI 33.44 kg/m   Body mass index is 33.44 kg/m.   Tobacco Social History   Tobacco Use  Smoking Status Never Smoker  Smokeless Tobacco Never Used     Counseling given: No   Past Medical History:  Diagnosis Date  . Alopecia    well controlled per previous PCP - improved with biotin  . Anxiety    when around ppl  . Aorto-iliac atherosclerosis (HCC) 05/2016   by xray  . Bipolar disorder (HCC)    no psych  . Chronic sinusitis   . Closed left humeral fracture 2010   closed left humeral neck fracture, also h/o L wrist fx  . Closed right humeral fracture 2011   coracoid nondisplaced, conservative therapy (Dr. Cleon Gustin)  . Depression   . Diverticulosis    by colonoscopy  . Falls    completed HHPT 10/2011, declined outpt PT. completed outpt PT 08/2016.  Marland Kitchen GERD (gastroesophageal reflux disease)   . Glucose intolerance (impaired glucose tolerance)   . High cholesterol   . History of chicken pox   . 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 HLD (hyperlipidemia)   . Humerus fracture   . Hyperactivity of bladder   . Hypothyroid    previously hyperthyroid  . Hypothyroidism   . Osteopenia 2010, 2016   DEXA 2010: T -1.3 L spine, -2.1 femoral neck. DEXA 2016: T -2.4 hip, T -0.5 spine; rpt 2 yrs  . PAD (peripheral artery disease) (HCC)   . Phlebitis    left leg  stays swollen  . Positive ANA (antinuclear antibody)   . Pulmonary embolism [facility])    Past Surgical History:  Procedure Laterality Date  . ARTERIAL THROMBECTOMY  07/2009   with R mid popliteal artery angioplasty [balloon] (2011)[[[[  . CARDIAC CATHETERIZATION  08/2014   nl EF, small branch OM not amenable to PCI [name])  . CESAREAN SECTION    . CESAREAN SECTION  1973  . CHOLECYSTECTOMY    . COLONOSCOPY  08/2008   int/ext hem, severe diverticulosis with evidence of itis, rec rpt 10 yrs  . hospitalization  01/2007   depression, ECT  . INGUINAL HERNIA REPAIR  1980s   x2 on right   Family History  Problem Relation Age of Onset  . Cancer Father   . Heart attack Mother 108  . Coronary artery disease Mother 84  . Hyperlipidemia Mother   . Heart disease Mother   . Hypertension Mother   . Breast cancer Maternal Aunt 54  . Coronary artery disease Maternal Uncle   . Coronary artery disease Maternal Grandmother 70  . Stroke Neg Hx   . Diabetes Neg Hx    Social History   Substance and Sexual Activity  Sexual Activity No    Outpatient Encounter  Medications as of 05/20/2017  Medication Sig  . acetaminophen (TYLENOL) 500 MG tablet Take 500 mg by mouth every 8 (eight) hours as needed for moderate pain.  Marland Kitchen. atorvastatin (LIPITOR) 20 MG tablet Take 1 tablet (20 mg total) by mouth daily.  . Biotin 1000 MCG tablet Take 1 tablet (1 mg total) by mouth 2 (two) times daily.  Marland Kitchen. buPROPion (WELLBUTRIN SR) 200 MG 12 hr tablet Take 1 tablet (200 mg total) by mouth 2 (two) times daily.  . Cholecalciferol (VITAMIN D) 2000 units CAPS Take 1 capsule (2,000 Units total) by mouth daily.  . clonazePAM (KLONOPIN) 1 MG tablet Take 1 tablet (1 mg total) by mouth 2 (two) times daily.  . cyclobenzaprine (FLEXERIL) 5 MG tablet Take 1 tablet (5 mg total) by mouth 2 (two) times daily.  . diclofenac sodium (VOLTAREN) 1 % GEL Apply 1 application topically 2 (two) times daily as needed. (Patient taking differently: Apply  1 application topically 2 (two) times daily as needed (pain). )  . Docusate Calcium (STOOL SOFTENER PO) Take 1 tablet by mouth daily as needed (constipation). Reported on 11/05/2015  . lamoTRIgine (LAMICTAL) 100 MG tablet TAKE 1 TABLET 2 TIMES DAILY. (Patient taking differently: Take 100 mg by mouth 2 (two) times daily. )  . levothyroxine (SYNTHROID, LEVOTHROID) 50 MCG tablet TAKE 1 TABLET EVERY DAY  . PRESCRIPTION MEDICATION Place 2 drops into both eyes daily as needed (dryness). Fresh coat  . traMADol (ULTRAM) 50 MG tablet Take 1 tablet (50 mg total) by mouth 2 (two) times daily as needed for moderate pain.  . traZODone (DESYREL) 100 MG tablet Take 0.5 tablets (50 mg total) by mouth at bedtime.  Marland Kitchen. warfarin (COUMADIN) 5 MG tablet TAKE DAILY AS DIRECTED BY COUMADIN CLINIC. (Patient taking differently: Take 5 mg by mouth daily. )   No facility-administered encounter medications on file as of 05/20/2017.     Activities of Daily Living In your present state of health, do you have any difficulty performing the following activities: 05/20/2017 04/22/2017  Hearing? N N  Vision? N N  Difficulty concentrating or making decisions? Y N  Walking or climbing stairs? Y Y  Dressing or bathing? N Y  Doing errands, shopping? Y N  Preparing Food and eating ? Y -  Comment spouse helps prepare meals -  Using the Toilet? N -  In the past six months, have you accidently leaked urine? N -  Do you have problems with loss of bowel control? N -  Managing your Medications? Y -  Managing your Finances? Y -  Housekeeping or managing your Housekeeping? Y -  Some recent data might be hidden    Patient Care Team: Eustaquio BoydenGutierrez, Javier, MD as PCP - General (Family Medicine) Clapacs, Jackquline DenmarkJohn T, MD as Referring Physician (Psychiatry) Soundra PilonJenna Roney, OD as Referring Physician (Optometry)    Assessment:     Hearing Screening   125Hz  250Hz  500Hz  1000Hz  2000Hz  3000Hz  4000Hz  6000Hz  8000Hz   Right ear:   40 40 40  0    Left ear:    0 0 0  0    Vision Screening Comments: Last vision exam in October 2018 with Dr. Valora Piccolooney   Exercise Activities and Dietary recommendations Current Exercise Habits: Home exercise routine, Type of exercise: Other - see comments(physical therapist), Time (Minutes): 45, Frequency (Times/Week): 2, Weekly Exercise (Minutes/Week): 90, Intensity: Mild, Exercise limited by: orthopedic condition(s)  Goals    None     Fall Risk Fall Risk  05/20/2017 04/17/2016  04/17/2016 04/12/2015 04/10/2014  Falls in the past year? Yes No No No No  Number falls in past yr: 2 or more - - - -  Injury with Fall? No - - - -  Risk Factor Category  High Fall Risk - - - -  Risk for fall due to : Impaired balance/gait;Impaired mobility - - - -   Depression Screen PHQ 2/9 Scores 05/20/2017 04/17/2016 04/17/2016 04/12/2015  PHQ - 2 Score 6 0 0 6  PHQ- 9 Score 9 - - 13     Cognitive Function MMSE - Mini Mental State Exam 05/20/2017 04/17/2016  Orientation to time 5 5  Orientation to Place 3 5  Orientation to Place-comments unable to provide county or state capital -  Registration 3 3  Attention/ Calculation 0 0  Recall 2 3  Recall-comments unable to recall 1 of 3 words -  Language- name 2 objects 0 0  Language- repeat 1 1  Language- follow 3 step command 2 3  Language- follow 3 step command-comments unable to follow 1 step of 3 step command -  Language- read & follow direction 0 0  Write a sentence 0 0  Copy design 0 0  Total score 16 20     PLEASE NOTE: A Mini-Cog screen was completed. Maximum score is 20. A value of 0 denotes this part of Folstein MMSE was not completed or the patient failed this part of the Mini-Cog screening.   Mini-Cog Screening Orientation to Time - Max 5 pts Orientation to Place - Max 5 pts Registration - Max 3 pts Recall - Max 3 pts Language Repeat - Max 1 pts Language Follow 3 Step Command - Max 3 pts     Immunization History  Administered Date(s) Administered  . Influenza Split  06/23/2011, 05/04/2012  . Influenza,inj,Quad PF,6+ Mos 03/11/2013, 04/10/2014, 04/12/2015, 03/07/2016, 04/29/2017  . Pneumococcal Conjugate-13 04/10/2014  . Pneumococcal Polysaccharide-23 02/18/2011  . Td 08/14/2011  . Zoster 08/15/2011   Screening Tests Health Maintenance  Topic Date Due  . MAMMOGRAM  05/19/2018 (Originally 04/24/2016)  . DTaP/Tdap/Td (1 - Tdap) 08/13/2021 (Originally 08/15/2011)  . COLONOSCOPY  08/25/2018  . TETANUS/TDAP  08/13/2021  . INFLUENZA VACCINE  Completed  . DEXA SCAN  Completed  . Hepatitis C Screening  Completed  . PNA vac Low Risk Adult  Completed      Plan:     I have personally reviewed, addressed, and noted the following in the patient's chart:  A. Medical and social history B. Use of alcohol, tobacco or illicit drugs  C. Current medications and supplements D. Functional ability and status E.  Nutritional status F.  Physical activity G. Advance directives H. List of other physicians I.  Hospitalizations, surgeries, and ER visits in previous 12 months J.  Vitals K. Screenings to include hearing, vision, cognitive, depression L. Referrals and appointments - none  In addition, I have reviewed and discussed with patient certain preventive protocols, quality metrics, and best practice recommendations. A written personalized care plan for preventive services as well as general preventive health recommendations were provided to patient.  See attached scanned questionnaire for additional information.   Signed,   Randa EvensLesia Jorey Dollard, MHA, BS, LPN Health Coach

## 2017-05-20 NOTE — Progress Notes (Signed)
PCP notes:   Health maintenance:  No gaps identified.   Abnormal screenings:   Depression score: 9 Mini-Cog score: 16/20 Fall risk - hx of multiple (12+) falls with and without injury  Hearing - failed  Hearing Screening   125Hz  250Hz  500Hz  1000Hz  2000Hz  3000Hz  4000Hz  6000Hz  8000Hz   Right ear:   40 40 40  0    Left ear:   0 0 0  0     Patient concerns:   Memory loss - spouse want to discuss medication to help with this concern; states patient sometimes does not know the day of the week  ENT or Audiology referral - recommended by home health PT due to patient's hx of falls  Medication management - spouse has requested refills of Levothyroxine and Warfarin  Nurse concerns:  None  Next PCP appt:   05/27/17 @ 1130

## 2017-05-24 NOTE — Progress Notes (Signed)
Agreed.  Thanks.  

## 2017-05-26 DIAGNOSIS — Z9181 History of falling: Secondary | ICD-10-CM | POA: Diagnosis not present

## 2017-05-26 DIAGNOSIS — E039 Hypothyroidism, unspecified: Secondary | ICD-10-CM | POA: Diagnosis not present

## 2017-05-26 DIAGNOSIS — Z7901 Long term (current) use of anticoagulants: Secondary | ICD-10-CM | POA: Diagnosis not present

## 2017-05-26 DIAGNOSIS — I10 Essential (primary) hypertension: Secondary | ICD-10-CM | POA: Diagnosis not present

## 2017-05-26 DIAGNOSIS — I739 Peripheral vascular disease, unspecified: Secondary | ICD-10-CM | POA: Diagnosis not present

## 2017-05-26 DIAGNOSIS — R269 Unspecified abnormalities of gait and mobility: Secondary | ICD-10-CM | POA: Diagnosis not present

## 2017-05-26 DIAGNOSIS — F419 Anxiety disorder, unspecified: Secondary | ICD-10-CM | POA: Diagnosis not present

## 2017-05-26 DIAGNOSIS — F319 Bipolar disorder, unspecified: Secondary | ICD-10-CM | POA: Diagnosis not present

## 2017-05-26 DIAGNOSIS — R296 Repeated falls: Secondary | ICD-10-CM | POA: Diagnosis not present

## 2017-05-27 ENCOUNTER — Ambulatory Visit (INDEPENDENT_AMBULATORY_CARE_PROVIDER_SITE_OTHER): Payer: Medicare HMO | Admitting: Family Medicine

## 2017-05-27 ENCOUNTER — Encounter: Payer: Self-pay | Admitting: Family Medicine

## 2017-05-27 VITALS — BP 120/78 | HR 71 | Temp 98.3°F | Ht <= 58 in | Wt 145.0 lb

## 2017-05-27 DIAGNOSIS — R269 Unspecified abnormalities of gait and mobility: Secondary | ICD-10-CM | POA: Diagnosis not present

## 2017-05-27 DIAGNOSIS — F3177 Bipolar disorder, in partial remission, most recent episode mixed: Secondary | ICD-10-CM

## 2017-05-27 DIAGNOSIS — E559 Vitamin D deficiency, unspecified: Secondary | ICD-10-CM

## 2017-05-27 DIAGNOSIS — Z7189 Other specified counseling: Secondary | ICD-10-CM | POA: Diagnosis not present

## 2017-05-27 DIAGNOSIS — I739 Peripheral vascular disease, unspecified: Secondary | ICD-10-CM

## 2017-05-27 DIAGNOSIS — E039 Hypothyroidism, unspecified: Secondary | ICD-10-CM | POA: Diagnosis not present

## 2017-05-27 DIAGNOSIS — R413 Other amnesia: Secondary | ICD-10-CM

## 2017-05-27 DIAGNOSIS — I700 Atherosclerosis of aorta: Secondary | ICD-10-CM | POA: Diagnosis not present

## 2017-05-27 DIAGNOSIS — H9190 Unspecified hearing loss, unspecified ear: Secondary | ICD-10-CM

## 2017-05-27 DIAGNOSIS — Z0000 Encounter for general adult medical examination without abnormal findings: Secondary | ICD-10-CM

## 2017-05-27 DIAGNOSIS — R296 Repeated falls: Secondary | ICD-10-CM

## 2017-05-27 DIAGNOSIS — Z0100 Encounter for examination of eyes and vision without abnormal findings: Secondary | ICD-10-CM | POA: Diagnosis not present

## 2017-05-27 DIAGNOSIS — I708 Atherosclerosis of other arteries: Secondary | ICD-10-CM

## 2017-05-27 DIAGNOSIS — E785 Hyperlipidemia, unspecified: Secondary | ICD-10-CM | POA: Diagnosis not present

## 2017-05-27 DIAGNOSIS — H903 Sensorineural hearing loss, bilateral: Secondary | ICD-10-CM | POA: Insufficient documentation

## 2017-05-27 DIAGNOSIS — G4700 Insomnia, unspecified: Secondary | ICD-10-CM

## 2017-05-27 DIAGNOSIS — I70299 Other atherosclerosis of native arteries of extremities, unspecified extremity: Secondary | ICD-10-CM

## 2017-05-27 DIAGNOSIS — M8580 Other specified disorders of bone density and structure, unspecified site: Secondary | ICD-10-CM

## 2017-05-27 MED ORDER — LEVOTHYROXINE SODIUM 50 MCG PO TABS: 50.0000 ug | ORAL_TABLET | Freq: Every day | ORAL | 3 refills | Status: DC

## 2017-05-27 NOTE — Assessment & Plan Note (Signed)
Chronic, continue statin. 

## 2017-05-27 NOTE — Assessment & Plan Note (Signed)
L hearing loss noted on recent screening. Pt agrees to audiology evaluation. No vertigo endorsed.

## 2017-05-27 NOTE — Assessment & Plan Note (Signed)
Chronic, stable. Continue current regimen. 

## 2017-05-27 NOTE — Assessment & Plan Note (Signed)
Suggested 1/2 trazodone at night trying to find lowest medication dose.

## 2017-05-27 NOTE — Assessment & Plan Note (Signed)
Advanced directive - states "if I can't think, just let me go". Thinks husband knows, would want husband then son to make decisions, but nothing formal yet.

## 2017-05-27 NOTE — Assessment & Plan Note (Signed)
Chronic, stable. Continue atorvastatin 20mg  daily. The ASCVD Risk score Kathryn Mathews(Goff DC Jr., et al., 2013) failed to calculate for the following reasons:   The patient has a prior MI or stroke diagnosis

## 2017-05-27 NOTE — Assessment & Plan Note (Signed)
Concern endorsed by husband. Recent mini-cog 16/20. I have asked them to return in 6-8 wks for geriatric assessment. Discussed importance of physical exercise, mental exercise, and social engagement. She feels HH has helped in all settings.

## 2017-05-27 NOTE — Assessment & Plan Note (Signed)
Encouraged regular walker use - less falls with this.

## 2017-05-27 NOTE — Assessment & Plan Note (Signed)
Appreciate psych care (Clapacs)  

## 2017-05-27 NOTE — Assessment & Plan Note (Addendum)
Forgets tablet, level low. Recommended include capsule in weekly pill box.

## 2017-05-27 NOTE — Assessment & Plan Note (Signed)
Preventative protocols reviewed and updated unless pt declined. Discussed healthy diet and lifestyle.  

## 2017-05-27 NOTE — Progress Notes (Signed)
BP 120/78 (BP Location: Left Arm, Patient Position: Sitting, Cuff Size: Normal)   Pulse 71   Temp 98.3 F (36.8 C) (Oral)   Ht 4\' 8"  (1.422 m)   Wt 145 lb (65.8 kg)   SpO2 97%   BMI 32.51 kg/m    CC: CPE Subjective:    Patient ID: Kathryn Mathews SERVICE, female    DOB: 12-06-1945, 71 y.o.   MRN: [id number]  HPI: Kathryn Mathews is a 71 y.o. female presenting on 05/27/2017 for Annual Exam (Pt 2)   Here with husband today.   Saw Kathryn Mathews last week for medicare wellness visit. Note reviewed. Depression score 9, minicog 16/20, multiple falls, failed L ear hearing screen. Agrees to audiology eval. Denies vertigo. Husband endorses she is more forgetful. F/u with psych 09/2017. 1 fall in the past month - tripped over shoes in closet. Continues seeing HH PT and OT.   Recent falls with hospitalization, abilify was stopped with improvement. She is also using walker more regularly.   Preventative: COLONOSCOPY Date: 08/2008 int/ext hem, severe diverticulosis with evidence of itis, rec rpt 10 yrs Mammogram - WNL 04/2015. Requests Q2 yrs. Declines breast exam today, does do breast exams at home.  Pap smear - Always normal in past. No problems with spotting, not sexually active. Decided to age out.  DEXA 2016: T -2.4 hip, T -0.5 spine; consider rpt 2 yrs.  Flu shot yearly Tetanus - 07/2011  Pneumovax 02/2011, prevnar 03/2014 zostavax 08/2011  shingrix - discussed Advanced directive - states "if I can't think, just let me go". Thinks husband knows, would want husband then son to make decisions, but nothing formal yet.  Seat belt use discussed.  Sunscreen use discussed, no changing moles on skin.  Non smoker  Alcohol - none  Caffeine: 2 diet coke/day Lives with husband, 1 cat. 1 grown son - lives nearby. [place] Activity: no regular exercise Diet: good water, fruits/vegetables daily  Relevant past medical, surgical, family and social history reviewed and updated as indicated. Interim  medical history since our last visit reviewed. Allergies and medications reviewed and updated. Outpatient Medications Prior to Visit  Medication Sig Dispense Refill  . acetaminophen (TYLENOL) 500 MG tablet Take 500 mg by mouth every 8 (eight) hours as needed for moderate pain.    Marland Kitchen atorvastatin (LIPITOR) 20 MG tablet Take 1 tablet (20 mg total) by mouth daily. 90 tablet 2  . Biotin 1000 MCG tablet Take 1 tablet (1 mg total) by mouth 2 (two) times daily. 180 tablet 3  . buPROPion (WELLBUTRIN SR) 200 MG 12 hr tablet Take 1 tablet (200 mg total) by mouth 2 (two) times daily. 180 tablet 1  . Cholecalciferol (VITAMIN D) 2000 units CAPS Take 1 capsule (2,000 Units total) by mouth daily. 90 capsule 3  . clonazePAM (KLONOPIN) 1 MG tablet Take 1 tablet (1 mg total) by mouth 2 (two) times daily. 180 tablet 1  . cyclobenzaprine (FLEXERIL) 5 MG tablet Take 1 tablet (5 mg total) by mouth 2 (two) times daily. 60 tablet 0  . diclofenac sodium (VOLTAREN) 1 % GEL Apply 1 application topically 2 (two) times daily as needed. (Patient taking differently: Apply 1 application topically 2 (two) times daily as needed (pain). ) 1 Tube 1  . Docusate Calcium (STOOL SOFTENER PO) Take 1 tablet by mouth daily as needed (constipation). Reported on 11/05/2015    . lamoTRIgine (LAMICTAL) 100 MG tablet TAKE 1 TABLET 2 TIMES DAILY. (Patient taking differently: Take  100 mg by mouth 2 (two) times daily. ) 180 tablet 1  . PRESCRIPTION MEDICATION Place 2 drops into both eyes daily as needed (dryness). Fresh coat    . traMADol (ULTRAM) 50 MG tablet Take 1 tablet (50 mg total) by mouth 2 (two) times daily as needed for moderate pain. 20 tablet 0  . traZODone (DESYREL) 100 MG tablet Take 0.5 tablets (50 mg total) by mouth at bedtime.    Marland Kitchen warfarin (COUMADIN) 5 MG tablet TAKE DAILY AS DIRECTED BY COUMADIN CLINIC.  Take 1-2 pills daily as directed by coumadin clinic. 120 tablet 1  . levothyroxine (SYNTHROID, LEVOTHROID) 50 MCG tablet Take 1  tablet (50 mcg total) daily by mouth. 90 tablet 3   No facility-administered medications prior to visit.      Per HPI unless specifically indicated in ROS section below Review of Systems  Constitutional: Negative for activity change, appetite change, chills, fatigue, fever and unexpected weight change.  HENT: Negative for hearing loss.   Eyes: Negative for visual disturbance.  Respiratory: Negative for cough, chest tightness, shortness of breath and wheezing.   Cardiovascular: Negative for chest pain, palpitations and leg swelling.  Gastrointestinal: Negative for abdominal distention, abdominal pain, blood in stool, constipation, diarrhea, nausea and vomiting.  Genitourinary: Negative for difficulty urinating and hematuria.  Musculoskeletal: Negative for arthralgias, myalgias and neck pain.       Leg pains  Skin: Negative for rash.  Neurological: Negative for dizziness, seizures, syncope and headaches.  Hematological: Negative for adenopathy. Bruises/bleeds easily.  Psychiatric/Behavioral: Negative for dysphoric mood. The patient is nervous/anxious.        Objective:    BP 120/78 (BP Location: Left Arm, Patient Position: Sitting, Cuff Size: Normal)   Pulse 71   Temp 98.3 F (36.8 C) (Oral)   Ht 4\' 8"  (1.422 m)   Wt 145 lb (65.8 kg)   SpO2 97%   BMI 32.51 kg/m   Wt Readings from Last 3 Encounters:  05/27/17 145 lb (65.8 kg)  05/20/17 146 lb 8 oz (66.5 kg)  04/29/17 147 lb 4 oz (66.8 kg)    Physical Exam  Constitutional: She is oriented to person, place, and time. She appears well-developed and well-nourished. No distress.  HENT:  Head: Normocephalic and atraumatic.  Right Ear: Hearing, tympanic membrane, external ear and ear canal normal.  Left Ear: Hearing, tympanic membrane, external ear and ear canal normal.  Nose: Nose normal.  Mouth/Throat: Uvula is midline, oropharynx is clear and moist and mucous membranes are normal. No oropharyngeal exudate, posterior  oropharyngeal edema or posterior oropharyngeal erythema.  Eyes: Conjunctivae and EOM are normal. Pupils are equal, round, and reactive to light. No scleral icterus.  Neck: Normal range of motion. Neck supple. No thyromegaly present.  Cardiovascular: Normal rate, regular rhythm, normal heart sounds and intact distal pulses.  No murmur heard. Pulses:      Radial pulses are 2+ on the right side, and 2+ on the left side.  Pulmonary/Chest: Effort normal and breath sounds normal. No respiratory distress. She has no wheezes. She has no rales.  Abdominal: Soft. Bowel sounds are normal. She exhibits no distension and no mass. There is no tenderness. There is no rebound and no guarding.  Musculoskeletal: Normal range of motion. She exhibits no edema.  Lymphadenopathy:    She has no cervical adenopathy.  Neurological: She is alert and oriented to person, place, and time.  CN grossly intact, station and gait intact  Skin: Skin is warm and dry.  No rash noted.  Psychiatric: She has a normal mood and affect. Her behavior is normal. Judgment and thought content normal.  Nursing note and vitals reviewed.  Results for orders placed or performed in visit on 05/20/17  VITAMIN D 25 Hydroxy (Vit-D Deficiency, Fractures)  Result Value Ref Range   VITD 25.59 (L) 30.00 - 100.00 ng/mL  TSH  Result Value Ref Range   TSH 1.90 0.35 - 4.50 uIU/mL  Lipid panel  Result Value Ref Range   Cholesterol 167 0 - 200 mg/dL   Triglycerides 956.2 0.0 - 149.0 mg/dL   HDL 13.08 >65.78 mg/dL   VLDL 46.9 0.0 - 62.9 mg/dL   LDL Cholesterol 99 0 - 99 mg/dL   Total CHOL/HDL Ratio 4    NonHDL 122.75       Assessment & Plan:   Problem List Items Addressed This Visit    Advanced care planning/counseling discussion    Advanced directive - states "if I can't think, just let me go". Thinks husband knows, would want husband then son to make decisions, but nothing formal yet.       Aorto-iliac atherosclerosis (HCC)    Chronic,  continue statin.      Bipolar disorder (HCC)    Appreciate psych care (Clapacs)      Gait disturbance    Encouraged regular walker use - less falls with this.       Health maintenance examination - Primary    Preventative protocols reviewed and updated unless pt declined. Discussed healthy diet and lifestyle.       Hearing loss    L hearing loss noted on recent screening. Pt agrees to audiology evaluation. No vertigo endorsed.       Relevant Orders   Ambulatory referral to Audiology   HLD (hyperlipidemia)    Chronic, stable. Continue atorvastatin 20mg  daily. The ASCVD Risk score Denman George DC Jr., et al., 2013) failed to calculate for the following reasons:   The patient has a prior MI or stroke diagnosis       Hypothyroid    Chronic, stable. Continue current regimen.       Relevant Medications   levothyroxine (SYNTHROID, LEVOTHROID) 50 MCG tablet   Insomnia    Suggested 1/2 trazodone at night trying to find lowest medication dose.       Memory deficit    Concern endorsed by husband. Recent mini-cog 16/20. I have asked them to return in 6-8 wks for geriatric assessment. Discussed importance of physical exercise, mental exercise, and social engagement. She feels HH has helped in all settings.       Osteopenia    Update DEXA scan. Reviewed vit D intake.  H/o L1 compression fracture 2014      Relevant Orders   DG Bone Density   PAD (peripheral artery disease) (HCC)    Chronic, continue statin.       Recurrent falls    This has improved off abilify and with more regular use of walker. Currently HH PT and OT involved.       Vitamin D deficiency    Forgets tablet, level low. Recommended include capsule in weekly pill box.           Follow up plan: Return in about 8 weeks (around 07/22/2017) for follow up visit.  Eustaquio Boyden, MD

## 2017-05-27 NOTE — Assessment & Plan Note (Addendum)
Update DEXA scan. Reviewed vit D intake.  H/o L1 compression fracture 2014

## 2017-05-27 NOTE — Patient Instructions (Addendum)
Try half dose of trazodone at bedtime. Take vitamin D 2000 units daily.  Call the breast center to schedule mammogram [phone] We will try to schedule bone density scan at same time as mammogram.  We will refer you to audiologist.  If interested, check with pharmacy about new 2 shot shingles series (shingrix).  Return as needed or in 6-8 weeks for memory evaluation (geriatric assessment). Consider vitamin B12 as well.   Health Maintenance, Female Adopting a healthy lifestyle and getting preventive care can go a long way to promote health and wellness. Talk with your health care provider about what schedule of regular examinations is right for you. This is a good chance for you to check in with your provider about disease prevention and staying healthy. In between checkups, there are plenty of things you can do on your own. Experts have done a lot of research about which lifestyle changes and preventive measures are most likely to keep you healthy. Ask your health care provider for more information. Weight and diet Eat a healthy diet  Be sure to include plenty of vegetables, fruits, low-fat dairy products, and lean protein.  Do not eat a lot of foods high in solid fats, added sugars, or salt.  Get regular exercise. This is one of the most important things you can do for your health. ? Most adults should exercise for at least 150 minutes each week. The exercise should increase your heart rate and make you sweat (moderate-intensity exercise). ? Most adults should also do strengthening exercises at least twice a week. This is in addition to the moderate-intensity exercise.  Maintain a healthy weight  Body mass index (BMI) is a measurement that can be used to identify possible weight problems. It estimates body fat based on height and weight. Your health care provider can help determine your BMI and help you achieve or maintain a healthy weight.  For females 19 years of age and older: ? A  BMI below 18.5 is considered underweight. ? A BMI of 18.5 to 24.9 is normal. ? A BMI of 25 to 29.9 is considered overweight. ? A BMI of 30 and above is considered obese.  Watch levels of cholesterol and blood lipids  You should start having your blood tested for lipids and cholesterol at 71 years of age, then have this test every 5 years.  You may need to have your cholesterol levels checked more often if: ? Your lipid or cholesterol levels are high. ? You are older than 71 years of age. ? You are at high risk for heart disease.  Cancer screening Lung Cancer  Lung cancer screening is recommended for adults 52-79 years old who are at high risk for lung cancer because of a history of smoking.  A yearly low-dose CT scan of the lungs is recommended for people who: ? Currently smoke. ? Have quit within the past 15 years. ? Have at least a 30-pack-year history of smoking. A pack year is smoking an average of one pack of cigarettes a day for 1 year.  Yearly screening should continue until it has been 15 years since you quit.  Yearly screening should stop if you develop a health problem that would prevent you from having lung cancer treatment.  Breast Cancer  Practice breast self-awareness. This means understanding how your breasts normally appear and feel.  It also means doing regular breast self-exams. Let your health care provider know about any changes, no matter how small.  If you  are in your 20s or 30s, you should have a clinical breast exam (CBE) by a health care provider every 1-3 years as part of a regular health exam.  If you are 26 or older, have a CBE every year. Also consider having a breast X-ray (mammogram) every year.  If you have a family history of breast cancer, talk to your health care provider about genetic screening.  If you are at high risk for breast cancer, talk to your health care provider about having an MRI and a mammogram every year.  Breast cancer gene  (BRCA) assessment is recommended for women who have family members with BRCA-related cancers. BRCA-related cancers include: ? Breast. ? Ovarian. ? Tubal. ? Peritoneal cancers.  Results of the assessment will determine the need for genetic counseling and BRCA1 and BRCA2 testing.  Cervical Cancer Your health care provider may recommend that you be screened regularly for cancer of the pelvic organs (ovaries, uterus, and vagina). This screening involves a pelvic examination, including checking for microscopic changes to the surface of your cervix (Pap test). You may be encouraged to have this screening done every 3 years, beginning at age 40.  For women ages 57-65, health care providers may recommend pelvic exams and Pap testing every 3 years, or they may recommend the Pap and pelvic exam, combined with testing for human papilloma virus (HPV), every 5 years. Some types of HPV increase your risk of cervical cancer. Testing for HPV may also be done on women of any age with unclear Pap test results.  Other health care providers may not recommend any screening for nonpregnant women who are considered low risk for pelvic cancer and who do not have symptoms. Ask your health care provider if a screening pelvic exam is right for you.  If you have had past treatment for cervical cancer or a condition that could lead to cancer, you need Pap tests and screening for cancer for at least 20 years after your treatment. If Pap tests have been discontinued, your risk factors (such as having a new sexual partner) need to be reassessed to determine if screening should resume. Some women have medical problems that increase the chance of getting cervical cancer. In these cases, your health care provider may recommend more frequent screening and Pap tests.  Colorectal Cancer  This type of cancer can be detected and often prevented.  Routine colorectal cancer screening usually begins at 71 years of age and continues  through 71 years of age.  Your health care provider may recommend screening at an earlier age if you have risk factors for colon cancer.  Your health care provider may also recommend using home test kits to check for hidden blood in the stool.  A small camera at the end of a tube can be used to examine your colon directly (sigmoidoscopy or colonoscopy). This is done to check for the earliest forms of colorectal cancer.  Routine screening usually begins at age 44.  Direct examination of the colon should be repeated every 5-10 years through 71 years of age. However, you may need to be screened more often if early forms of precancerous polyps or small growths are found.  Skin Cancer  Check your skin from head to toe regularly.  Tell your health care provider about any new moles or changes in moles, especially if there is a change in a mole's shape or color.  Also tell your health care provider if you have a mole that is larger than  the size of a pencil eraser.  Always use sunscreen. Apply sunscreen liberally and repeatedly throughout the day.  Protect yourself by wearing long sleeves, pants, a wide-brimmed hat, and sunglasses whenever you are outside.  Heart disease, diabetes, and high blood pressure  High blood pressure causes heart disease and increases the risk of stroke. High blood pressure is more likely to develop in: ? People who have blood pressure in the high end of the normal range (130-139/85-89 mm Hg). ? People who are overweight or obese. ? People who are African American.  If you are 60-62 years of age, have your blood pressure checked every 3-5 years. If you are 60 years of age or older, have your blood pressure checked every year. You should have your blood pressure measured twice-once when you are at a hospital or clinic, and once when you are not at a hospital or clinic. Record the average of the two measurements. To check your blood pressure when you are not at a  hospital or clinic, you can use: ? An automated blood pressure machine at a pharmacy. ? A home blood pressure monitor.  If you are between 43 years and 10 years old, ask your health care provider if you should take aspirin to prevent strokes.  Have regular diabetes screenings. This involves taking a blood sample to check your fasting blood sugar level. ? If you are at a normal weight and have a low risk for diabetes, have this test once every three years after 71 years of age. ? If you are overweight and have a high risk for diabetes, consider being tested at a younger age or more often. Preventing infection Hepatitis B  If you have a higher risk for hepatitis B, you should be screened for this virus. You are considered at high risk for hepatitis B if: ? You were born in a country where hepatitis B is common. Ask your health care provider which countries are considered high risk. ? Your parents were born in a high-risk country, and you have not been immunized against hepatitis B (hepatitis B vaccine). ? You have HIV or AIDS. ? You use needles to inject street drugs. ? You live with someone who has hepatitis B. ? You have had sex with someone who has hepatitis B. ? You get hemodialysis treatment. ? You take certain medicines for conditions, including cancer, organ transplantation, and autoimmune conditions.  Hepatitis C  Blood testing is recommended for: ? Everyone born from [birth year] through 1965. ? Anyone with known risk factors for hepatitis C.  Sexually transmitted infections (STIs)  You should be screened for sexually transmitted infections (STIs) including gonorrhea and chlamydia if: ? You are sexually active and are younger than 71 years of age. ? You are older than 71 years of age and your health care provider tells you that you are at risk for this type of infection. ? Your sexual activity has changed since you were last screened and you are at an increased risk for chlamydia or  gonorrhea. Ask your health care provider if you are at risk.  If you do not have HIV, but are at risk, it may be recommended that you take a prescription medicine daily to prevent HIV infection. This is called pre-exposure prophylaxis (PrEP). You are considered at risk if: ? You are sexually active and do not regularly use condoms or know the HIV status of your partner(s). ? You take drugs by injection. ? You are sexually active with a  partner who has HIV.  Talk with your health care provider about whether you are at high risk of being infected with HIV. If you choose to begin PrEP, you should first be tested for HIV. You should then be tested every 3 months for as long as you are taking PrEP. Pregnancy  If you are premenopausal and you may become pregnant, ask your health care provider about preconception counseling.  If you may become pregnant, take 400 to 800 micrograms (mcg) of folic acid every day.  If you want to prevent pregnancy, talk to your health care provider about birth control (contraception). Osteoporosis and menopause  Osteoporosis is a disease in which the bones lose minerals and strength with aging. This can result in serious bone fractures. Your risk for osteoporosis can be identified using a bone density scan.  If you are 40 years of age or older, or if you are at risk for osteoporosis and fractures, ask your health care provider if you should be screened.  Ask your health care provider whether you should take a calcium or vitamin D supplement to lower your risk for osteoporosis.  Menopause may have certain physical symptoms and risks.  Hormone replacement therapy may reduce some of these symptoms and risks. Talk to your health care provider about whether hormone replacement therapy is right for you. Follow these instructions at home:  Schedule regular health, dental, and eye exams.  Stay current with your immunizations.  Do not use any tobacco products including  cigarettes, chewing tobacco, or electronic cigarettes.  If you are pregnant, do not drink alcohol.  If you are breastfeeding, limit how much and how often you drink alcohol.  Limit alcohol intake to no more than 1 drink per day for nonpregnant women. One drink equals 12 ounces of beer, 5 ounces of wine, or 1 ounces of hard liquor.  Do not use street drugs.  Do not share needles.  Ask your health care provider for help if you need support or information about quitting drugs.  Tell your health care provider if you often feel depressed.  Tell your health care provider if you have ever been abused or do not feel safe at home. This information is not intended to replace advice given to you by your health care provider. Make sure you discuss any questions you have with your health care provider. Document Released: 01/13/2011 Document Revised: 12/06/2015 Document Reviewed: 04/03/2015 Elsevier Interactive Patient Education  [employer].

## 2017-05-27 NOTE — Assessment & Plan Note (Signed)
This has improved off abilify and with more regular use of walker. Currently HH PT and OT involved.

## 2017-05-28 DIAGNOSIS — F319 Bipolar disorder, unspecified: Secondary | ICD-10-CM | POA: Diagnosis not present

## 2017-05-28 DIAGNOSIS — I739 Peripheral vascular disease, unspecified: Secondary | ICD-10-CM | POA: Diagnosis not present

## 2017-05-28 DIAGNOSIS — E039 Hypothyroidism, unspecified: Secondary | ICD-10-CM | POA: Diagnosis not present

## 2017-05-28 DIAGNOSIS — F419 Anxiety disorder, unspecified: Secondary | ICD-10-CM | POA: Diagnosis not present

## 2017-05-28 DIAGNOSIS — Z7901 Long term (current) use of anticoagulants: Secondary | ICD-10-CM | POA: Diagnosis not present

## 2017-05-28 DIAGNOSIS — Z9181 History of falling: Secondary | ICD-10-CM | POA: Diagnosis not present

## 2017-05-28 DIAGNOSIS — I10 Essential (primary) hypertension: Secondary | ICD-10-CM | POA: Diagnosis not present

## 2017-05-28 DIAGNOSIS — R269 Unspecified abnormalities of gait and mobility: Secondary | ICD-10-CM | POA: Diagnosis not present

## 2017-05-28 DIAGNOSIS — R296 Repeated falls: Secondary | ICD-10-CM | POA: Diagnosis not present

## 2017-06-01 ENCOUNTER — Telehealth: Payer: Self-pay | Admitting: Family Medicine

## 2017-06-01 DIAGNOSIS — F419 Anxiety disorder, unspecified: Secondary | ICD-10-CM | POA: Diagnosis not present

## 2017-06-01 DIAGNOSIS — R269 Unspecified abnormalities of gait and mobility: Secondary | ICD-10-CM | POA: Diagnosis not present

## 2017-06-01 DIAGNOSIS — Z9181 History of falling: Secondary | ICD-10-CM | POA: Diagnosis not present

## 2017-06-01 DIAGNOSIS — Z7901 Long term (current) use of anticoagulants: Secondary | ICD-10-CM | POA: Diagnosis not present

## 2017-06-01 DIAGNOSIS — I10 Essential (primary) hypertension: Secondary | ICD-10-CM | POA: Diagnosis not present

## 2017-06-01 DIAGNOSIS — R296 Repeated falls: Secondary | ICD-10-CM | POA: Diagnosis not present

## 2017-06-01 DIAGNOSIS — I739 Peripheral vascular disease, unspecified: Secondary | ICD-10-CM | POA: Diagnosis not present

## 2017-06-01 DIAGNOSIS — F319 Bipolar disorder, unspecified: Secondary | ICD-10-CM | POA: Diagnosis not present

## 2017-06-01 DIAGNOSIS — E039 Hypothyroidism, unspecified: Secondary | ICD-10-CM | POA: Diagnosis not present

## 2017-06-01 NOTE — Telephone Encounter (Signed)
Agree to this. Thanks. 

## 2017-06-01 NOTE — Telephone Encounter (Signed)
Spoke with Vernona RiegerLaura relaying message per Dr. Reece AgarG.  She says ok.

## 2017-06-01 NOTE — Telephone Encounter (Signed)
Copied from CRM [id number]. Topic: Quick Communication - See Telephone Encounter >> Jun 01, 2017 11:19 AM Diana EvesHoyt, Maryann B wrote: CRM for notification. See Telephone encounter for:  Vernona RiegerLaura from [facility] wanting PT continued 2x a week for 4 weeks call back number [phone] can leave messaged on VM. 06/01/17.

## 2017-06-02 ENCOUNTER — Ambulatory Visit (INDEPENDENT_AMBULATORY_CARE_PROVIDER_SITE_OTHER): Payer: Medicare HMO

## 2017-06-02 DIAGNOSIS — I739 Peripheral vascular disease, unspecified: Secondary | ICD-10-CM

## 2017-06-02 DIAGNOSIS — Z7901 Long term (current) use of anticoagulants: Secondary | ICD-10-CM | POA: Diagnosis not present

## 2017-06-02 LAB — POCT INR: INR: 5.1

## 2017-06-02 NOTE — Patient Instructions (Signed)
INR today is 5.1  Husband states that patient likely took an extra 5mg  on Sunday, in addition to the dosing boost we made at last visit.  Patient is to hold coumadin today (11/20), tomorrow (11/21) and Thursday (11/22) and then resume prior dosing of 5mg  daily EXCEPT for 7.5mg  on Mondays and Fridays as patient likely would have been close to range if hadn't taken extra medication.  Recheck in 1 week due to the holiday this week.  Patient and husband educated on risks associated with a supratherapeutic level and will go to ER if any concerns develop.  Patient denies any unusual bruising or bleeding.    Patient's husband verbalizes understanding will follow above prescribed plan.

## 2017-06-08 DIAGNOSIS — I10 Essential (primary) hypertension: Secondary | ICD-10-CM | POA: Diagnosis not present

## 2017-06-08 DIAGNOSIS — R269 Unspecified abnormalities of gait and mobility: Secondary | ICD-10-CM | POA: Diagnosis not present

## 2017-06-08 DIAGNOSIS — R296 Repeated falls: Secondary | ICD-10-CM | POA: Diagnosis not present

## 2017-06-08 DIAGNOSIS — E039 Hypothyroidism, unspecified: Secondary | ICD-10-CM | POA: Diagnosis not present

## 2017-06-08 DIAGNOSIS — F419 Anxiety disorder, unspecified: Secondary | ICD-10-CM | POA: Diagnosis not present

## 2017-06-08 DIAGNOSIS — F319 Bipolar disorder, unspecified: Secondary | ICD-10-CM | POA: Diagnosis not present

## 2017-06-08 DIAGNOSIS — I739 Peripheral vascular disease, unspecified: Secondary | ICD-10-CM | POA: Diagnosis not present

## 2017-06-08 DIAGNOSIS — Z7901 Long term (current) use of anticoagulants: Secondary | ICD-10-CM | POA: Diagnosis not present

## 2017-06-08 DIAGNOSIS — Z9181 History of falling: Secondary | ICD-10-CM | POA: Diagnosis not present

## 2017-06-09 ENCOUNTER — Ambulatory Visit (INDEPENDENT_AMBULATORY_CARE_PROVIDER_SITE_OTHER): Payer: Medicare HMO

## 2017-06-09 DIAGNOSIS — Z7901 Long term (current) use of anticoagulants: Secondary | ICD-10-CM | POA: Diagnosis not present

## 2017-06-09 DIAGNOSIS — I739 Peripheral vascular disease, unspecified: Secondary | ICD-10-CM

## 2017-06-09 LAB — POCT INR: INR: 1.5

## 2017-06-09 NOTE — Patient Instructions (Addendum)
INR today 1.5  Patient will now take 5mg  daily and recheck in 1 week.  Dosage cut due to supratherapeutic reading at last visit, patient has been holding for several days before restarting regimen.    We will monitor INR carefully and adjust dosage as needed, patient continues to have abnormal readings despite dosage adjustments.  I am unsure whether patient is taking correctly and have discussed this at length with husband.  I have encouraged him to start watching patient take the medications and reiterated importance of keeping bottle out of patient's sight and reach as she sometimes becomes confused.    Patient and husband educated on risks associated with a subtherapeutic level and will go to ER if any concerns develop.  Patient denies any unusual bruising or bleeding.    Patient's husband verbalizes understanding will follow above prescribed plan.

## 2017-06-17 ENCOUNTER — Ambulatory Visit (INDEPENDENT_AMBULATORY_CARE_PROVIDER_SITE_OTHER): Payer: Medicare HMO

## 2017-06-17 DIAGNOSIS — Z7901 Long term (current) use of anticoagulants: Secondary | ICD-10-CM

## 2017-06-17 DIAGNOSIS — R269 Unspecified abnormalities of gait and mobility: Secondary | ICD-10-CM | POA: Diagnosis not present

## 2017-06-17 DIAGNOSIS — I10 Essential (primary) hypertension: Secondary | ICD-10-CM | POA: Diagnosis not present

## 2017-06-17 DIAGNOSIS — E039 Hypothyroidism, unspecified: Secondary | ICD-10-CM | POA: Diagnosis not present

## 2017-06-17 DIAGNOSIS — I739 Peripheral vascular disease, unspecified: Secondary | ICD-10-CM

## 2017-06-17 DIAGNOSIS — R296 Repeated falls: Secondary | ICD-10-CM | POA: Diagnosis not present

## 2017-06-17 DIAGNOSIS — F419 Anxiety disorder, unspecified: Secondary | ICD-10-CM | POA: Diagnosis not present

## 2017-06-17 DIAGNOSIS — F319 Bipolar disorder, unspecified: Secondary | ICD-10-CM | POA: Diagnosis not present

## 2017-06-17 DIAGNOSIS — Z9181 History of falling: Secondary | ICD-10-CM | POA: Diagnosis not present

## 2017-06-17 LAB — POCT INR: INR: 1.3

## 2017-06-17 NOTE — Patient Instructions (Addendum)
INR today 1.3  Take 1.5 pills (7.5mg ) today 12/4 and tomorrow 12/5 and then start increased dosing of 1 pill daily EXCEPT for 1.5 pills (7.5mg ) on Fridays.  Recheck in 1-2 weeks.    Patient and husband educated on risks associated with a subtherapeutic level and will go to ER if any concerns develop.  Patient denies any unusual bruising or bleeding.    Patient's husband verbalizes understanding will follow above prescribed plan.

## 2017-06-30 ENCOUNTER — Ambulatory Visit (INDEPENDENT_AMBULATORY_CARE_PROVIDER_SITE_OTHER): Payer: Medicare HMO

## 2017-06-30 DIAGNOSIS — I739 Peripheral vascular disease, unspecified: Secondary | ICD-10-CM

## 2017-06-30 DIAGNOSIS — Z7901 Long term (current) use of anticoagulants: Secondary | ICD-10-CM

## 2017-06-30 LAB — POCT INR: INR: 2.2

## 2017-06-30 NOTE — Patient Instructions (Signed)
NR today 2.2  Continue taking 1 pill daily EXCEPT for 1.5 pills (7.5mg ) on Fridays.  Recheck in 2-3 weeks due to frequent subtherapeutic levels.    Patient's husband verbalizes understanding will follow above prescribed plan.

## 2017-07-15 ENCOUNTER — Ambulatory Visit (INDEPENDENT_AMBULATORY_CARE_PROVIDER_SITE_OTHER): Payer: Medicare HMO | Admitting: Family Medicine

## 2017-07-15 ENCOUNTER — Ambulatory Visit (INDEPENDENT_AMBULATORY_CARE_PROVIDER_SITE_OTHER): Payer: Medicare HMO

## 2017-07-15 ENCOUNTER — Encounter: Payer: Self-pay | Admitting: Family Medicine

## 2017-07-15 VITALS — BP 138/88 | HR 91 | Temp 98.4°F | Wt 145.0 lb

## 2017-07-15 DIAGNOSIS — Z7901 Long term (current) use of anticoagulants: Secondary | ICD-10-CM

## 2017-07-15 DIAGNOSIS — R413 Other amnesia: Secondary | ICD-10-CM | POA: Diagnosis not present

## 2017-07-15 DIAGNOSIS — G4700 Insomnia, unspecified: Secondary | ICD-10-CM

## 2017-07-15 DIAGNOSIS — R269 Unspecified abnormalities of gait and mobility: Secondary | ICD-10-CM | POA: Diagnosis not present

## 2017-07-15 DIAGNOSIS — I739 Peripheral vascular disease, unspecified: Secondary | ICD-10-CM

## 2017-07-15 LAB — POCT INR: INR: 3

## 2017-07-15 NOTE — Patient Instructions (Signed)
Memory overall looking ok.  Continue regular physical activity, social engagement, and reading and word puzzles as able.  Return as needed or in May for 6 month follow up visit.

## 2017-07-15 NOTE — Assessment & Plan Note (Addendum)
MMSE borderline 27/30 today - missed 1 orientation, 1 recall, and 1 language. Recommended continued monitoring at this time, reviewed further conservative treatment strategies with physical activity, mental exercises, and social engagement. Pt and husband agree with plan.

## 2017-07-15 NOTE — Assessment & Plan Note (Signed)
Presumed multifactorial including medication related. Improved with regular walker use and med changes.

## 2017-07-15 NOTE — Assessment & Plan Note (Signed)
Forgets PM trazodone - encouraged place in pill box as routine med.

## 2017-07-15 NOTE — Progress Notes (Signed)
BP 138/88 (BP Location: Left Arm, Patient Position: Sitting, Cuff Size: Large)   Pulse 91   Temp 98.4 F (36.9 C) (Oral)   Wt 145 lb (65.8 kg)   SpO2 97%   BMI 32.51 kg/m    CC: geriatric evaluation Subjective:    Patient ID: Kathryn ShownSharon J Frosch, female    DOB: 10/22/1945, 72 y.o.   MRN: [id number]  HPI: Kathryn Mathews is a 72 y.o. female presenting on 07/15/2017 for Follow-up   Here with husband. See prior note for details.  [facility] PT/OT involved - completed 6 wks of therapy for fall prevention. Continue to encourage regular walker use.  We referred to audiology last visit due to failed left hearing screen. She has not gone yet. Encouraged schedule appt.   She enjoys watching nightly [employer].  Ongoing insomnia. She forgets her night time trazodone. Poor sleep habits - some nights goes to sleep at 3am.   INR today 3.0.   Geriatric Assessment: Activities of Daily Living:     Bathing- independent    Dressing- independent    Eating- independent     Toileting- independent    Transferring- independent    Continence- independent Overall Assessment: independent  Instrumental Activities of Daily Living:     Transportation- dependent    Meal/Food Preparation- independent    Shopping Errands- dependent    Housekeeping/Chores- partially dependent    Money Management/Finances- dependent    Medication Management- partially dependent    Ability to Use Telephone- independent    Laundry- independent Overall Assessment: partially dependent  Mental Status Exam: (value/max value) 27/30  Clock Drawing Score: 3/4 - missed minute/hour hands   Relevant past medical, surgical, family and social history reviewed and updated as indicated. Interim medical history since our last visit reviewed. Allergies and medications reviewed and updated. Outpatient Medications Prior to Visit  Medication Sig Dispense Refill  . acetaminophen (TYLENOL) 500 MG tablet Take 500 mg by mouth every 8 (eight)  hours as needed for moderate pain.    Marland Kitchen. atorvastatin (LIPITOR) 20 MG tablet Take 1 tablet (20 mg total) by mouth daily. 90 tablet 2  . Biotin 1000 MCG tablet Take 1 tablet (1 mg total) by mouth 2 (two) times daily. 180 tablet 3  . buPROPion (WELLBUTRIN SR) 200 MG 12 hr tablet Take 1 tablet (200 mg total) by mouth 2 (two) times daily. 180 tablet 1  . Cholecalciferol (VITAMIN D) 2000 units CAPS Take 1 capsule (2,000 Units total) by mouth daily. 90 capsule 3  . clonazePAM (KLONOPIN) 1 MG tablet Take 1 tablet (1 mg total) by mouth 2 (two) times daily. 180 tablet 1  . cyclobenzaprine (FLEXERIL) 5 MG tablet Take 1 tablet (5 mg total) by mouth 2 (two) times daily. 60 tablet 0  . diclofenac sodium (VOLTAREN) 1 % GEL Apply 1 application topically 2 (two) times daily as needed. (Patient taking differently: Apply 1 application topically 2 (two) times daily as needed (pain). ) 1 Tube 1  . Docusate Calcium (STOOL SOFTENER PO) Take 1 tablet by mouth daily as needed (constipation). Reported on 11/05/2015    . lamoTRIgine (LAMICTAL) 100 MG tablet TAKE 1 TABLET 2 TIMES DAILY. (Patient taking differently: Take 100 mg by mouth 2 (two) times daily. ) 180 tablet 1  . levothyroxine (SYNTHROID, LEVOTHROID) 50 MCG tablet Take 1 tablet (50 mcg total) daily by mouth. 90 tablet 3  . PRESCRIPTION MEDICATION Place 2 drops into both eyes daily as needed (dryness). Fresh coat    .  traMADol (ULTRAM) 50 MG tablet Take 1 tablet (50 mg total) by mouth 2 (two) times daily as needed for moderate pain. 20 tablet 0  . traZODone (DESYREL) 100 MG tablet Take 0.5 tablets (50 mg total) by mouth at bedtime.    Marland Kitchen warfarin (COUMADIN) 5 MG tablet TAKE DAILY AS DIRECTED BY COUMADIN CLINIC.  Take 1-2 pills daily as directed by coumadin clinic. 120 tablet 1   No facility-administered medications prior to visit.      Per HPI unless specifically indicated in ROS section below Review of Systems     Objective:    BP 138/88 (BP Location: Left  Arm, Patient Position: Sitting, Cuff Size: Large)   Pulse 91   Temp 98.4 F (36.9 C) (Oral)   Wt 145 lb (65.8 kg)   SpO2 97%   BMI 32.51 kg/m   Wt Readings from Last 3 Encounters:  07/15/17 145 lb (65.8 kg)  05/27/17 145 lb (65.8 kg)  05/20/17 146 lb 8 oz (66.5 kg)    Physical Exam  Constitutional: She appears well-developed and well-nourished. No distress.  Walks with walker  Skin: Skin is warm and dry.  Psychiatric: She has a normal mood and affect.  Nursing note and vitals reviewed.  Results for orders placed or performed in visit on 07/15/17  POCT INR  Result Value Ref Range   INR 3.0       Assessment & Plan:  Over 25 minutes were spent face-to-face with the patient during this encounter and >50% of that time was spent on counseling and coordination of care  Problem List Items Addressed This Visit    Gait disturbance    Presumed multifactorial including medication related. Improved with regular walker use and med changes.       Insomnia    Forgets PM trazodone - encouraged place in pill box as routine med.       Memory deficit - Primary    MMSE borderline 27/30 today - missed 1 orientation, 1 recall, and 1 language. Recommended continued monitoring at this time, reviewed further conservative treatment strategies with physical activity, mental exercises, and social engagement. Pt and husband agree with plan.           Follow up plan: Return in about 5 months (around 12/13/2017) for follow up visit.  Eustaquio Boyden, MD

## 2017-07-15 NOTE — Patient Instructions (Signed)
INR today 3.0   Continue taking 1 (5mg ) pill daily EXCEPT for 1.5 pills (7.5mg ) on Fridays and recheck in 1 month.    Patient's husband verbalizes understanding of written plan.    No changes in diet, health or medications at the present time.

## 2017-07-28 ENCOUNTER — Ambulatory Visit
Admission: RE | Admit: 2017-07-28 | Discharge: 2017-07-28 | Disposition: A | Discharge status: Home / Self Care | Payer: Medicare HMO | Source: Ambulatory Visit | Attending: Family Medicine | Admitting: Family Medicine

## 2017-07-28 DIAGNOSIS — M85852 Other specified disorders of bone density and structure, left thigh: Secondary | ICD-10-CM | POA: Diagnosis not present

## 2017-07-28 DIAGNOSIS — M8580 Other specified disorders of bone density and structure, unspecified site: Secondary | ICD-10-CM

## 2017-07-28 DIAGNOSIS — Z780 Asymptomatic menopausal state: Secondary | ICD-10-CM | POA: Diagnosis not present

## 2017-08-13 ENCOUNTER — Ambulatory Visit (INDEPENDENT_AMBULATORY_CARE_PROVIDER_SITE_OTHER): Payer: Medicare HMO | Admitting: General Practice

## 2017-08-13 DIAGNOSIS — I739 Peripheral vascular disease, unspecified: Secondary | ICD-10-CM | POA: Diagnosis not present

## 2017-08-13 DIAGNOSIS — Z7901 Long term (current) use of anticoagulants: Secondary | ICD-10-CM | POA: Diagnosis not present

## 2017-08-13 LAB — POCT INR: INR: 2.2

## 2017-08-13 NOTE — Patient Instructions (Addendum)
Pre visit review using our clinic review tool, if applicable. No additional management support is needed unless otherwise documented below in the visit note.  Continue taking 1 pill daily EXCEPT for 1.5 pills (7.5mg ) on Fridays.  Recheck in 4 weeks.

## 2017-09-10 ENCOUNTER — Ambulatory Visit (INDEPENDENT_AMBULATORY_CARE_PROVIDER_SITE_OTHER): Payer: Medicare HMO | Admitting: General Practice

## 2017-09-10 DIAGNOSIS — Z7901 Long term (current) use of anticoagulants: Secondary | ICD-10-CM

## 2017-09-10 DIAGNOSIS — I739 Peripheral vascular disease, unspecified: Secondary | ICD-10-CM

## 2017-09-10 LAB — POCT INR: INR: 2.4

## 2017-09-10 NOTE — Patient Instructions (Addendum)
Pre visit review using our clinic review tool, if applicable. No additional management support is needed unless otherwise documented below in the visit note.  Continue taking 1 pill daily EXCEPT for 1.5 pills (7.5mg ) on Fridays.  Recheck in 6 weeks.

## 2017-09-29 ENCOUNTER — Ambulatory Visit: Payer: Medicare HMO | Admitting: Psychiatry

## 2017-10-06 ENCOUNTER — Other Ambulatory Visit: Payer: Self-pay

## 2017-10-06 ENCOUNTER — Encounter: Payer: Self-pay | Admitting: Psychiatry

## 2017-10-06 ENCOUNTER — Ambulatory Visit: Payer: Medicare HMO | Admitting: Psychiatry

## 2017-10-06 DIAGNOSIS — R42 Dizziness and giddiness: Secondary | ICD-10-CM | POA: Diagnosis not present

## 2017-10-06 DIAGNOSIS — H903 Sensorineural hearing loss, bilateral: Secondary | ICD-10-CM | POA: Diagnosis not present

## 2017-10-07 ENCOUNTER — Encounter: Payer: Self-pay | Admitting: Family Medicine

## 2017-10-09 ENCOUNTER — Telehealth: Payer: Self-pay | Admitting: Psychiatry

## 2017-10-09 DIAGNOSIS — F411 Generalized anxiety disorder: Secondary | ICD-10-CM

## 2017-10-09 MED ORDER — CLONAZEPAM 1 MG PO TABS: 1.0000 mg | ORAL_TABLET | Freq: Two times a day (BID) | ORAL | 0 refills | Status: DC

## 2017-10-09 NOTE — Telephone Encounter (Signed)
Received message from Lea that patient would like her Klonopin filled until she can follow-up with Dr. Gerre Pebbleslapac.  Reviewed previous notes from Dr. Gerre Pebbleslapac.  Also reviewed the [place] controlled substance database.  Will send her 6 days worth of Klonopin.  [facility].  Script sent.

## 2017-10-13 NOTE — Telephone Encounter (Signed)
Thank you :)

## 2017-10-15 ENCOUNTER — Encounter: Payer: Self-pay | Admitting: Psychiatry

## 2017-10-15 ENCOUNTER — Other Ambulatory Visit: Payer: Self-pay

## 2017-10-15 ENCOUNTER — Ambulatory Visit (INDEPENDENT_AMBULATORY_CARE_PROVIDER_SITE_OTHER): Payer: Medicare HMO | Admitting: Psychiatry

## 2017-10-15 VITALS — BP 112/76 | HR 68 | Temp 98.5°F

## 2017-10-15 DIAGNOSIS — F411 Generalized anxiety disorder: Secondary | ICD-10-CM | POA: Diagnosis not present

## 2017-10-15 MED ORDER — CLONAZEPAM 0.5 MG PO TABS: 0.5000 mg | ORAL_TABLET | Freq: Two times a day (BID) | ORAL | 1 refills | Status: DC

## 2017-10-15 MED ORDER — BUPROPION HCL ER (SR) 200 MG PO TB12: 200.0000 mg | ORAL_TABLET | Freq: Two times a day (BID) | ORAL | 1 refills | Status: DC

## 2017-10-15 MED ORDER — TRAZODONE HCL 100 MG PO TABS: 50.0000 mg | ORAL_TABLET | Freq: Every day | ORAL | 1 refills | Status: DC

## 2017-10-15 NOTE — Progress Notes (Signed)
Things seem to be looking even worse for the patient.  She now is wheelchair bound even though as far as I can tell there is no specific reason for it.  She says that she just gets weak and falls down.  Her primary care doctor thinks it may have something to do with her medicine which is certainly possible.  Patient denies suicidal thoughts.  As usual very down depressed and hopeless.  No evidence of psychosis.  Neatly dressed and groomed.  Negative affect.  Thoughts lucid.  I think we should be cutting down on her Klonopin.  Cut down to 1/2 mg twice a day.  Continue other medicine.  Try to encourage her to please be more physically active if she wants to get better but of course she always falls back on everything being hopeless.  Follow-up 6 months.

## 2017-10-20 DIAGNOSIS — R42 Dizziness and giddiness: Secondary | ICD-10-CM | POA: Diagnosis not present

## 2017-10-22 ENCOUNTER — Ambulatory Visit (INDEPENDENT_AMBULATORY_CARE_PROVIDER_SITE_OTHER): Payer: Medicare HMO | Admitting: General Practice

## 2017-10-22 DIAGNOSIS — I739 Peripheral vascular disease, unspecified: Secondary | ICD-10-CM

## 2017-10-22 DIAGNOSIS — Z7901 Long term (current) use of anticoagulants: Secondary | ICD-10-CM | POA: Diagnosis not present

## 2017-10-22 LAB — POCT INR: INR: 1.8

## 2017-10-22 NOTE — Patient Instructions (Addendum)
Pre visit review using our clinic review tool, if applicable. No additional management support is needed unless otherwise documented below in the visit note.  Take extra 1/2 tablet today and then continue taking 1 pill daily EXCEPT for 1.5 pills (7.5mg ) on Fridays.  Recheck in 4 weeks.

## 2017-10-26 DIAGNOSIS — R42 Dizziness and giddiness: Secondary | ICD-10-CM | POA: Diagnosis not present

## 2017-10-27 DIAGNOSIS — H903 Sensorineural hearing loss, bilateral: Secondary | ICD-10-CM | POA: Diagnosis not present

## 2017-10-27 DIAGNOSIS — H8192 Unspecified disorder of vestibular function, left ear: Secondary | ICD-10-CM | POA: Diagnosis not present

## 2017-11-03 DIAGNOSIS — R42 Dizziness and giddiness: Secondary | ICD-10-CM | POA: Diagnosis not present

## 2017-11-10 DIAGNOSIS — R42 Dizziness and giddiness: Secondary | ICD-10-CM | POA: Diagnosis not present

## 2017-11-16 ENCOUNTER — Ambulatory Visit: Payer: Medicare HMO | Admitting: Family Medicine

## 2017-11-17 DIAGNOSIS — R42 Dizziness and giddiness: Secondary | ICD-10-CM | POA: Diagnosis not present

## 2017-11-19 ENCOUNTER — Ambulatory Visit (INDEPENDENT_AMBULATORY_CARE_PROVIDER_SITE_OTHER): Payer: Medicare HMO | Admitting: General Practice

## 2017-11-19 DIAGNOSIS — I739 Peripheral vascular disease, unspecified: Secondary | ICD-10-CM | POA: Diagnosis not present

## 2017-11-19 DIAGNOSIS — Z7901 Long term (current) use of anticoagulants: Secondary | ICD-10-CM

## 2017-11-19 LAB — POCT INR: INR: 1.6

## 2017-11-19 NOTE — Patient Instructions (Addendum)
Pre visit review using our clinic review tool, if applicable. No additional management support is needed unless otherwise documented below in the visit note.  Take extra 1/2 tablet today and take 10 mg on Friday.  On Saturday start taking 1 pill daily EXCEPT for 1.5 pills (7.5mg ) on Monday/ Fridays.  Recheck in 4 weeks.

## 2017-11-24 ENCOUNTER — Ambulatory Visit (INDEPENDENT_AMBULATORY_CARE_PROVIDER_SITE_OTHER): Payer: Medicare HMO | Admitting: Family Medicine

## 2017-11-24 ENCOUNTER — Encounter: Payer: Self-pay | Admitting: Family Medicine

## 2017-11-24 VITALS — BP 132/84 | HR 67 | Temp 98.1°F | Ht <= 58 in | Wt 148.5 lb

## 2017-11-24 DIAGNOSIS — G4700 Insomnia, unspecified: Secondary | ICD-10-CM

## 2017-11-24 DIAGNOSIS — R296 Repeated falls: Secondary | ICD-10-CM | POA: Diagnosis not present

## 2017-11-24 DIAGNOSIS — F3177 Bipolar disorder, in partial remission, most recent episode mixed: Secondary | ICD-10-CM

## 2017-11-24 DIAGNOSIS — R413 Other amnesia: Secondary | ICD-10-CM | POA: Diagnosis not present

## 2017-11-24 DIAGNOSIS — M8580 Other specified disorders of bone density and structure, unspecified site: Secondary | ICD-10-CM

## 2017-11-24 DIAGNOSIS — R011 Cardiac murmur, unspecified: Secondary | ICD-10-CM

## 2017-11-24 MED ORDER — ATORVASTATIN CALCIUM 20 MG PO TABS: 20.0000 mg | ORAL_TABLET | Freq: Every day | ORAL | 1 refills | Status: DC

## 2017-11-24 MED ORDER — WARFARIN SODIUM 5 MG PO TABS: ORAL_TABLET | ORAL | 3 refills | Status: AC

## 2017-11-24 MED ORDER — CALCIUM-VITAMIN D 600-400 MG-UNIT PO TABS: 1.0000 | ORAL_TABLET | Freq: Every day | ORAL | Status: AC

## 2017-11-24 NOTE — Assessment & Plan Note (Signed)
Unsteadiness seems to be improving with lower klonopin dosing.

## 2017-11-24 NOTE — Patient Instructions (Addendum)
You are doing well today.  Touch base with Dr Toni Amend about trazodone dosing.  Return as needed or in 6 months for wellness visit.

## 2017-11-24 NOTE — Assessment & Plan Note (Signed)
Chronic. Will continue to monitor

## 2017-11-24 NOTE — Assessment & Plan Note (Signed)
Appreciate psych care.  ?

## 2017-11-24 NOTE — Progress Notes (Signed)
BP 132/84 (BP Location: Left Arm, Patient Position: Sitting, Cuff Size: Normal)   Pulse 67   Temp 98.1 F (36.7 C) (Oral)   Ht  (1.422 m)   Wt 148 lb 8 oz (67.4 kg)   SpO2 98%   BMI 33.29 kg/m    CC: f/u visit Subjective:    Patient ID: Kathryn Mathews, female    DOB: 10/28/1945, 72 y.o.   MRN: [id number]  HPI: MECKENZIE BALSLEY is a 72 y.o. female presenting on 11/24/2017 for Follow-up (Here for follow-up memory, insomnia and gait. Pt accompanied by husband.)   Feels balance has been better this past month. No falls or near falls in the last month. No staggering.   Saw psych - klonopin dose was decreased from  to 0.5mg .  Saw ENT Dr Andee Poles - completed vestibular rehab. 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   Having trouble sleeping despite trazodone - finds if she takes  at a time this helps.   Relevant past medical, surgical, family and social history reviewed and updated as indicated. Interim medical history since our last visit reviewed. Allergies and medications reviewed and updated. Outpatient Medications Prior to Visit  Medication Sig Dispense Refill  . acetaminophen (TYLENOL) 500 MG tablet Take 500 mg by mouth every 8 (eight) hours as needed for moderate pain.    . Biotin 1000 MCG tablet Take 1 tablet (1 mg total) by mouth 2 (two) times daily. 180 tablet 3  . buPROPion (WELLBUTRIN SR) 200 MG 12 hr tablet Take 1 tablet (200 mg total) by mouth 2 (two) times daily. 180 tablet 1  . Cholecalciferol (VITAMIN D) 2000 units CAPS Take 1 capsule (2,000 Units total) by mouth daily. 90 capsule 3  . clonazePAM (KLONOPIN) 0.5 MG tablet Take 1 tablet (0.5 mg total) by mouth 2 (two) times daily. 180 tablet 1  . Cyanocobalamin (VITAMIN B-12 PO)     . cyclobenzaprine (FLEXERIL) 5 MG tablet Take 1 tablet (5 mg total) by mouth 2 (two) times daily. 60 tablet 0  . diclofenac sodium (VOLTAREN) 1 % GEL Apply 1 application topically 2 (two) times  daily as needed. (Patient taking differently: Apply 1 application topically 2 (two) times daily as needed (pain). ) 1 Tube 1  . Docusate Calcium (STOOL SOFTENER PO) Take 1 tablet by mouth daily as needed (constipation). Reported on 11/05/2015    . lamoTRIgine (LAMICTAL) 100 MG tablet TAKE 1 TABLET 2 TIMES DAILY. (Patient taking differently: Take 100 mg by mouth 2 (two) times daily. ) 180 tablet 1  . levothyroxine (SYNTHROID, LEVOTHROID) 50 MCG tablet Take 1 tablet (50 mcg total) daily by mouth. 90 tablet 3  . PRESCRIPTION MEDICATION Place 2 drops into both eyes daily as needed (dryness). Fresh coat    . traMADol (ULTRAM) 50 MG tablet Take 1 tablet (50 mg total) by mouth 2 (two) times daily as needed for moderate pain. 20 tablet 0  . traZODone (DESYREL) 100 MG tablet Take 0.5 tablets (50 mg total) by mouth at bedtime. 45 tablet 1  . atorvastatin (LIPITOR) 20 MG tablet Take 1 tablet (20 mg total) by mouth daily. 90 tablet 2  . CALCIUM CITRATE-VITAMIN D PO     . warfarin (COUMADIN) 5 MG tablet TAKE DAILY AS DIRECTED BY COUMADIN CLINIC.  Take 1-2 pills daily as directed by coumadin clinic. 120 tablet 1   No facility-administered medications prior to visit.      Per  HPI unless specifically indicated in ROS section below Review of Systems     Objective:    BP 132/84 (BP Location: Left Arm, Patient Position: Sitting, Cuff Size: Normal)   Pulse 67   Temp 98.1 F (36.7 C) (Oral)   Ht  (1.422 m)   Wt 148 lb 8 oz (67.4 kg)   SpO2 98%   BMI 33.29 kg/m   Wt Readings from Last 3 Encounters:  11/24/17 148 lb 8 oz (67.4 kg)  10/06/17 145 lb 9.6 oz (66 kg)  07/15/17 145 lb (65.8 kg)    Physical Exam  Constitutional: She appears well-developed and well-nourished. No distress.  HENT:  Mouth/Throat: Oropharynx is clear and moist. No oropharyngeal exudate.  Cardiovascular: Normal rate and regular rhythm.  Murmur (3/6 systolic best at USB) heard. Pulmonary/Chest: Effort normal and breath sounds  normal. No respiratory distress. She has no wheezes. She has no rales.  Musculoskeletal: She exhibits no edema.  Neurological:  Walks unassisted to and from exam room today Able to get on exam table quickly with some assistance.  Nursing note and vitals reviewed.  Results for orders placed or performed in visit on 11/19/17  POCT INR  Result Value Ref Range   INR 1.6    Lab Results  Component Value Date   VITAMINB12 688 04/22/2017      Assessment & Plan:   Problem List Items Addressed This Visit    Bipolar disorder [facility])    Appreciate psych care.       Insomnia    Reviewed sleep hygiene. I encouraged she touch base with psych about trazodone dosing.       Memory deficit    Stable period.       Osteopenia    Reviewed cal/vit D dosing      Recurrent falls    Unsteadiness seems to be improving with lower klonopin dosing.       Systolic murmur - Primary    Chronic. Will continue to monitor.           Meds ordered this encounter  Medications  . warfarin (COUMADIN) 5 MG tablet    Sig: TAKE DAILY AS DIRECTED BY COUMADIN CLINIC.  Take 1-2 pills daily as directed by coumadin clinic.    Dispense:  120 tablet    Refill:  3  . atorvastatin (LIPITOR) 20 MG tablet    Sig: Take 1 tablet (20 mg total) by mouth daily.    Dispense:  90 tablet    Refill:  1  . Calcium Carb-Cholecalciferol (CALCIUM-VITAMIN D) 600-400 MG-UNIT TABS    Sig: Take 1 tablet by mouth daily.    Dispense:  60 tablet   No orders of the defined types were placed in this encounter.   Follow up plan: Return in about 6 months (around 05/27/2018) for annual exam, prior fasting for blood work, medicare wellness visit.  Eustaquio Boyden, MD

## 2017-11-24 NOTE — Assessment & Plan Note (Signed)
Reviewed cal/vit D dosing

## 2017-11-24 NOTE — Assessment & Plan Note (Signed)
Reviewed sleep hygiene. I encouraged she touch base with psych about trazodone dosing.

## 2017-11-24 NOTE — Assessment & Plan Note (Signed)
Stable period.  

## 2017-12-17 ENCOUNTER — Ambulatory Visit (INDEPENDENT_AMBULATORY_CARE_PROVIDER_SITE_OTHER): Payer: Medicare HMO | Admitting: General Practice

## 2017-12-17 DIAGNOSIS — Z7901 Long term (current) use of anticoagulants: Secondary | ICD-10-CM | POA: Diagnosis not present

## 2017-12-17 DIAGNOSIS — I739 Peripheral vascular disease, unspecified: Secondary | ICD-10-CM

## 2017-12-17 LAB — POCT INR: INR: 2.8 (ref 2.0–3.0)

## 2017-12-17 NOTE — Patient Instructions (Addendum)
Pre visit review using our clinic review tool, if applicable. No additional management support is needed unless otherwise documented below in the visit note.  Continue taking 1 pill daily EXCEPT for 1.5 pills (7.5mg ) on Monday/ Fridays.  Recheck in 4 weeks.

## 2018-01-08 DIAGNOSIS — G3184 Mild cognitive impairment, so stated: Secondary | ICD-10-CM | POA: Diagnosis not present

## 2018-01-08 DIAGNOSIS — R2689 Other abnormalities of gait and mobility: Secondary | ICD-10-CM | POA: Diagnosis not present

## 2018-01-08 DIAGNOSIS — H04123 Dry eye syndrome of bilateral lacrimal glands: Secondary | ICD-10-CM | POA: Diagnosis not present

## 2018-01-21 ENCOUNTER — Ambulatory Visit (INDEPENDENT_AMBULATORY_CARE_PROVIDER_SITE_OTHER): Payer: Medicare HMO | Admitting: General Practice

## 2018-01-21 DIAGNOSIS — I739 Peripheral vascular disease, unspecified: Secondary | ICD-10-CM

## 2018-01-21 DIAGNOSIS — Z7901 Long term (current) use of anticoagulants: Secondary | ICD-10-CM | POA: Diagnosis not present

## 2018-01-21 LAB — POCT INR: INR: 2.2 (ref 2.0–3.0)

## 2018-01-21 NOTE — Patient Instructions (Addendum)
Pre visit review using our clinic review tool, if applicable. No additional management support is needed unless otherwise documented below in the visit note.  Continue taking 1 pill daily EXCEPT for 1.5 pills (7.5mg ) on Monday/ Fridays.  Recheck in 4 weeks.

## 2018-01-30 DIAGNOSIS — S0502XA Injury of conjunctiva and corneal abrasion without foreign body, left eye, initial encounter: Secondary | ICD-10-CM | POA: Diagnosis not present

## 2018-01-30 DIAGNOSIS — H04123 Dry eye syndrome of bilateral lacrimal glands: Secondary | ICD-10-CM | POA: Diagnosis not present

## 2018-02-08 ENCOUNTER — Ambulatory Visit (INDEPENDENT_AMBULATORY_CARE_PROVIDER_SITE_OTHER): Payer: Medicare HMO | Admitting: Family Medicine

## 2018-02-08 ENCOUNTER — Encounter: Payer: Self-pay | Admitting: Family Medicine

## 2018-02-08 VITALS — BP 130/82 | HR 83 | Temp 98.3°F | Ht <= 58 in | Wt 146.2 lb

## 2018-02-08 DIAGNOSIS — R350 Frequency of micturition: Secondary | ICD-10-CM | POA: Diagnosis not present

## 2018-02-08 DIAGNOSIS — R21 Rash and other nonspecific skin eruption: Secondary | ICD-10-CM

## 2018-02-08 LAB — POC URINALSYSI DIPSTICK (AUTOMATED)
Bilirubin, UA: NEGATIVE
GLUCOSE UA: NEGATIVE
Ketones, UA: NEGATIVE
Leukocytes, UA: NEGATIVE
Nitrite, UA: POSITIVE
PH UA: 6 (ref 5.0–8.0)
PROTEIN UA: NEGATIVE
SPEC GRAV UA: 1.015 (ref 1.010–1.025)
Urobilinogen, UA: 0.2 E.U./dL

## 2018-02-08 MED ORDER — CEPHALEXIN 500 MG PO CAPS: 500.0000 mg | ORAL_CAPSULE | Freq: Two times a day (BID) | ORAL | 0 refills | Status: DC

## 2018-02-08 NOTE — Assessment & Plan Note (Signed)
Anticipate benign conditions including milia on face, bug bites to forearm, irritated mole to back and skin tag with mild vulvar dermatitis. Supportive care reviewed. Update if not improving.

## 2018-02-08 NOTE — Assessment & Plan Note (Signed)
UA with positive nitrates - and micro suspicious for infection. Will Rx bactrim course.

## 2018-02-08 NOTE — Progress Notes (Signed)
BP 130/82 (BP Location: Left Arm, Patient Position: Sitting, Cuff Size: Large)   Pulse 83   Temp 98.3 F (36.8 C) (Oral)   Ht 4\' 8"  (1.422 m)   Wt 146 lb 4 oz (66.3 kg)   SpO2 95%   BMI 32.79 kg/m    CC: check rash Subjective:    Patient ID: NARIA CHOUINARD, female    DOB: [birth year], 72 y.o.   MRN: [id number]  HPI: TAHLOR KOSIER is a 72 y.o. female presenting on 02/08/2018 for Skin Lesions (C/o skin lesions on right side of face, posterior neck, right arm and vaginal area. Sxs started on vaginal area yesterday but has been everywhere else for awhile. Pt accompanied by her husband. ) and Urinary Frequency (C/o urinary frequency "forever". Denies any dysuria. )   Noticing spots on skin for several months. Started as a bump on left inner eye that seems to be spreading. Also noticing bump on right chin that is very itchy. Noticing itch papules on skin. Treating spots with neosporin.   No new lotions, detergents, soaps or shampoos.  No new foods. No new medicines, supplements.  No sick contacts at home.   She has started using eye drops (systane and dexamethasone eye drops) - per ophthalmologist. Having trouble with this.   Increasing urinary frequency and urgency noted over last few weeks. Some nausea. No dysuria. No abd pain, flank pain, fevers/chills. No hematuria.   Relevant past medical, surgical, family and social history reviewed and updated as indicated. Interim medical history since our last visit reviewed. Allergies and medications reviewed and updated. Outpatient Medications Prior to Visit  Medication Sig Dispense Refill  . acetaminophen (TYLENOL) 500 MG tablet Take 500 mg by mouth every 8 (eight) hours as needed for moderate pain.    . Artificial Tear Solution (SOOTHE XP OP) Apply 1 drop to eye 2 (two) times daily as needed.    Marland Kitchen atorvastatin (LIPITOR) 20 MG tablet Take 1 tablet (20 mg total) by mouth daily. 90 tablet 1  . Biotin 1000 MCG tablet Take 1 tablet (1 mg  total) by mouth 2 (two) times daily. 180 tablet 3  . buPROPion (WELLBUTRIN SR) 200 MG 12 hr tablet Take 1 tablet (200 mg total) by mouth 2 (two) times daily. 180 tablet 1  . Calcium Carb-Cholecalciferol (CALCIUM-VITAMIN D) 600-400 MG-UNIT TABS Take 1 tablet by mouth daily. 60 tablet   . Cholecalciferol (VITAMIN D) 2000 units CAPS Take 1 capsule (2,000 Units total) by mouth daily. 90 capsule 3  . clonazePAM (KLONOPIN) 0.5 MG tablet Take 1 tablet (0.5 mg total) by mouth 2 (two) times daily. 180 tablet 1  . Cyanocobalamin (VITAMIN B-12 PO)     . cyclobenzaprine (FLEXERIL) 5 MG tablet Take 1 tablet (5 mg total) by mouth 2 (two) times daily. 60 tablet 0  . diclofenac sodium (VOLTAREN) 1 % GEL Apply 1 application topically 2 (two) times daily as needed. (Patient taking differently: Apply 1 application topically 2 (two) times daily as needed (pain). ) 1 Tube 1  . Docusate Calcium (STOOL SOFTENER PO) Take 1 tablet by mouth daily as needed (constipation). Reported on 11/05/2015    . lamoTRIgine (LAMICTAL) 100 MG tablet TAKE 1 TABLET 2 TIMES DAILY. (Patient taking differently: Take 100 mg by mouth 2 (two) times daily. ) 180 tablet 1  . levothyroxine (SYNTHROID, LEVOTHROID) 50 MCG tablet Take 1 tablet (50 mcg total) daily by mouth. 90 tablet 3  . PRESCRIPTION MEDICATION Place 2 drops into  both eyes daily as needed (dryness). Fresh coat    . Propylene Glycol (SYSTANE COMPLETE OP) Apply 2 drops to eye 2 (two) times daily as needed.    . traMADol (ULTRAM) 50 MG tablet Take 1 tablet (50 mg total) by mouth 2 (two) times daily as needed for moderate pain. 20 tablet 0  . traZODone (DESYREL) 100 MG tablet Take 0.5 tablets (50 mg total) by mouth at bedtime. 45 tablet 1  . warfarin (COUMADIN) 5 MG tablet TAKE DAILY AS DIRECTED BY COUMADIN CLINIC.  Take 1-2 pills daily as directed by coumadin clinic. 120 tablet 3  . fluorometholone (FML) 0.1 % ophthalmic suspension SHAKE WELL AND INT 1 GTT INTO OU QID UTD  0   No  facility-administered medications prior to visit.      Per HPI unless specifically indicated in ROS section below Review of Systems     Objective:    BP 130/82 (BP Location: Left Arm, Patient Position: Sitting, Cuff Size: Large)   Pulse 83   Temp 98.3 F (36.8 C) (Oral)   Ht 4\' 8"  (1.422 m)   Wt 146 lb 4 oz (66.3 kg)   SpO2 95%   BMI 32.79 kg/m   Wt Readings from Last 3 Encounters:  02/08/18 146 lb 4 oz (66.3 kg)  11/24/17 148 lb 8 oz (67.4 kg)  07/15/17 145 lb (65.8 kg)    Physical Exam  Constitutional: She appears well-developed and well-nourished. No distress.  Abdominal: Soft. Bowel sounds are normal. She exhibits no distension and no mass. There is no tenderness. There is no rebound, no guarding and no CVA tenderness. No hernia.  Skin: Skin is warm and dry. Rash noted. No pallor.  Several different skin issues -  1. isolated small flesh colored papules on face - L orbital area and R lower cheek at nasolabial fold 2. Erythematous papules R forearm, one with surrounding ecchymosis 3. Irritated nodule mid upper thoracic back 4. Small growth R inguinal region ?skin tag vs verrucous growth, as well as erythema throughout vulvar region  Nursing note and vitals reviewed.  Results for orders placed or performed in visit on 02/08/18  POCT Urinalysis Dipstick (Automated)  Result Value Ref Range   Color, UA yellow    Clarity, UA clear    Glucose, UA Negative Negative   Bilirubin, UA negative    Ketones, UA negative    Spec Grav, UA 1.015 1.010 - 1.025   Blood, UA trace    pH, UA 6.0 5.0 - 8.0   Protein, UA Negative Negative   Urobilinogen, UA 0.2 0.2 or 1.0 E.U./dL   Nitrite, UA positive    Leukocytes, UA Negative Negative      Assessment & Plan:   Problem List Items Addressed This Visit    Urine frequency    UA with positive nitrates - and micro suspicious for infection. Will Rx bactrim course.       Relevant Orders   Urine Culture   POCT Urinalysis Dipstick  (Automated) (Completed)   Skin rash - Primary    Anticipate benign conditions including milia on face, bug bites to forearm, irritated mole to back and skin tag with mild vulvar dermatitis. Supportive care reviewed. Update if not improving.           Meds ordered this encounter  Medications  . cephALEXin (KEFLEX) 500 MG capsule    Sig: Take 1 capsule (500 mg total) by mouth 2 (two) times daily.    Dispense:  14  capsule    Refill:  0   Orders Placed This Encounter  Procedures  . Urine Culture  . POCT Urinalysis Dipstick (Automated)    Follow up plan: Return if symptoms worsen or fail to improve.  Eustaquio Boyden, MD

## 2018-02-08 NOTE — Patient Instructions (Addendum)
I think bumps on face are milia - this happens when flakes of skin get trapped on pockets near surface of skin.  Spots on arm may be bug bites - treat with steroid cream as needed for itching.  Urine culture sent today. We will call you with results. In the mean time avoid bladder irritants like caffeine, sodas, and spicy foods.

## 2018-02-09 ENCOUNTER — Other Ambulatory Visit: Payer: Self-pay | Admitting: Psychiatry

## 2018-02-10 LAB — URINE CULTURE
MICRO NUMBER:: 90893393
SPECIMEN QUALITY: ADEQUATE

## 2018-02-17 ENCOUNTER — Telehealth: Payer: Self-pay

## 2018-02-17 DIAGNOSIS — H04123 Dry eye syndrome of bilateral lacrimal glands: Secondary | ICD-10-CM | POA: Diagnosis not present

## 2018-02-17 DIAGNOSIS — H538 Other visual disturbances: Secondary | ICD-10-CM

## 2018-02-17 NOTE — Telephone Encounter (Signed)
Husband came by and states that [facility] [place] to see Dr Sherryll BurgerShah due to eye pain and blurred vision trouble she is having. Due to her [employer] insurance she needs a referral sent to them before she is seen. Can you please place the referral in and I will fax it to them. Patient has an appointment today 02/17/18 at 3:45 pm. Please reviw-Anastasiya V Hopkins, RMA

## 2018-02-17 NOTE — Telephone Encounter (Signed)
referral placed. Thanks!

## 2018-02-17 NOTE — Telephone Encounter (Signed)
Referral faxed to [facility] at fax number: [phone]-Toby Ayad Ander PurpuraV Marshel Golubski, RMA

## 2018-02-18 ENCOUNTER — Ambulatory Visit (INDEPENDENT_AMBULATORY_CARE_PROVIDER_SITE_OTHER): Payer: Medicare HMO | Admitting: General Practice

## 2018-02-18 DIAGNOSIS — I739 Peripheral vascular disease, unspecified: Secondary | ICD-10-CM

## 2018-02-18 DIAGNOSIS — Z7901 Long term (current) use of anticoagulants: Secondary | ICD-10-CM

## 2018-02-18 LAB — POCT INR: INR: 1.4 — AB (ref 2.0–3.0)

## 2018-02-18 NOTE — Patient Instructions (Signed)
Pre visit review using our clinic review tool, if applicable. No additional management support is needed unless otherwise documented below in the visit note.  Take 1 1/2 tablets today (8/8) and take 2 tablets tomorrow (8/9).  On Saturday continue taking 1 pill daily EXCEPT for 1.5 pills (7.5mg ) on Monday/ Fridays.  Recheck in 3 weeks.

## 2018-02-24 ENCOUNTER — Telehealth: Payer: Self-pay

## 2018-02-24 NOTE — Telephone Encounter (Signed)
Patient's husband Mr. Hinsley, came by the office and dropped off letter from [facility] dated on 02/03/18. Letter stated that patient was seen by ophthalmologist at [facility] office on 01/08/18 and that claim was not going to be covered for the amount of $105 due to referral was not made to this specialist from PCP. I called [employer] and spoke to several representatives and found out that opthalmologist patient saw is actually in network with the insurance plan for this, and does not need a referral. Per representative I was told to call ophthalmologist's office and ask them to resubmit this claim and add a note that states "this ophthalmologist/provider is operating under a tax ID facility or group of providers that are in network for patient's insurance plan and also to include Reference # [id number]. I called [facility]. Office and advised them of all of this, I was notified per their office that they have not received a rejected claim and that everything seems to have been paid for and they can not resubmit a claim that was not rejected. I advised their office and patient's husband of everything and that I will put a note in the chart about this and scan letter from [facility] in the chart in case something comes up in the future for the [employer]Bennett Ram V Javoni Lucken, RMA

## 2018-03-08 DIAGNOSIS — H04123 Dry eye syndrome of bilateral lacrimal glands: Secondary | ICD-10-CM | POA: Diagnosis not present

## 2018-03-11 ENCOUNTER — Ambulatory Visit (INDEPENDENT_AMBULATORY_CARE_PROVIDER_SITE_OTHER): Payer: Medicare HMO | Admitting: General Practice

## 2018-03-11 DIAGNOSIS — I739 Peripheral vascular disease, unspecified: Secondary | ICD-10-CM

## 2018-03-11 DIAGNOSIS — Z7901 Long term (current) use of anticoagulants: Secondary | ICD-10-CM

## 2018-03-11 LAB — POCT INR: INR: 1.3 — AB (ref 2.0–3.0)

## 2018-03-11 NOTE — Patient Instructions (Addendum)
Pre visit review using our clinic review tool, if applicable. No additional management support is needed unless otherwise documented below in the visit note.  Take 1 1/2 tablets today (8/29) and take 2 tablets tomorrow (8/30) and take 1 1/2 tablets on Saturday (8/31).  On Sunday start taking 1 pill daily EXCEPT for 1.5 pills (7.5mg ) on Monday/Wednesday/ Fridays.  Recheck in 2 weeks.  Patient denies missed doses and denies any changes in diet.

## 2018-03-25 ENCOUNTER — Ambulatory Visit (INDEPENDENT_AMBULATORY_CARE_PROVIDER_SITE_OTHER): Payer: Medicare HMO | Admitting: General Practice

## 2018-03-25 ENCOUNTER — Ambulatory Visit (INDEPENDENT_AMBULATORY_CARE_PROVIDER_SITE_OTHER): Payer: Medicare HMO

## 2018-03-25 DIAGNOSIS — Z23 Encounter for immunization: Secondary | ICD-10-CM

## 2018-03-25 DIAGNOSIS — Z7901 Long term (current) use of anticoagulants: Secondary | ICD-10-CM

## 2018-03-25 DIAGNOSIS — I739 Peripheral vascular disease, unspecified: Secondary | ICD-10-CM

## 2018-03-25 LAB — POCT INR: INR: 2.7 (ref 2.0–3.0)

## 2018-03-25 NOTE — Patient Instructions (Signed)
Pre visit review using our clinic review tool, if applicable. No additional management support is needed unless otherwise documented below in the visit note.  Continue to take 1 pill daily EXCEPT for 1.5 pills (7.5mg ) on Monday/Wednesday/ Fridays.  Recheck in 4 weeks.

## 2018-04-29 ENCOUNTER — Ambulatory Visit: Payer: Medicare HMO | Admitting: Psychiatry

## 2018-04-29 ENCOUNTER — Ambulatory Visit (INDEPENDENT_AMBULATORY_CARE_PROVIDER_SITE_OTHER): Payer: Medicare HMO | Admitting: General Practice

## 2018-04-29 DIAGNOSIS — F411 Generalized anxiety disorder: Secondary | ICD-10-CM

## 2018-04-29 DIAGNOSIS — Z5181 Encounter for therapeutic drug level monitoring: Secondary | ICD-10-CM | POA: Diagnosis not present

## 2018-04-29 DIAGNOSIS — Z7901 Long term (current) use of anticoagulants: Secondary | ICD-10-CM

## 2018-04-29 DIAGNOSIS — Z0000 Encounter for general adult medical examination without abnormal findings: Secondary | ICD-10-CM

## 2018-04-29 DIAGNOSIS — Z86718 Personal history of other venous thrombosis and embolism: Secondary | ICD-10-CM

## 2018-04-29 LAB — POCT INR: INR: 3.4 — AB (ref 2.0–3.0)

## 2018-04-29 MED ORDER — CLONAZEPAM 0.5 MG PO TABS: 0.5000 mg | ORAL_TABLET | Freq: Two times a day (BID) | ORAL | 1 refills | Status: DC

## 2018-04-29 MED ORDER — TRAZODONE HCL 100 MG PO TABS: 50.0000 mg | ORAL_TABLET | Freq: Every day | ORAL | 1 refills | Status: DC

## 2018-04-29 MED ORDER — LAMOTRIGINE 100 MG PO TABS: 100.0000 mg | ORAL_TABLET | Freq: Two times a day (BID) | ORAL | 1 refills | Status: DC

## 2018-04-29 MED ORDER — BUPROPION HCL ER (SR) 200 MG PO TB12: 200.0000 mg | ORAL_TABLET | Freq: Two times a day (BID) | ORAL | 1 refills | Status: DC

## 2018-04-29 NOTE — Progress Notes (Signed)
Patient seen for follow-up.  She no longer is using a wheelchair for what ever reason.  She says that her problems with walking just went away.  Continues to complain of chronic depression with all of the focus being on how bad her husband and her extended family are to her.  Neatly dressed and groomed.  Much better looking than she was last time.  Thoughts lucid no loosening of associations no delusions.  No active suicidal thoughts.  Unlikely to benefit from any further change of medicine but will continue current medication treatment supportive therapy and counseling follow-up 6 months.

## 2018-04-29 NOTE — Patient Instructions (Addendum)
Pre visit review using our clinic review tool, if applicable. No additional management support is needed unless otherwise documented below in the visit note.  Hold dosage today (10/17) and then continue to take 1 pill daily EXCEPT for 1.5 pills (7.5mg ) on Monday/Wednesday/ Fridays.  Recheck in 4 weeks.

## 2018-05-25 ENCOUNTER — Other Ambulatory Visit: Payer: Self-pay | Admitting: Family Medicine

## 2018-05-25 DIAGNOSIS — E039 Hypothyroidism, unspecified: Secondary | ICD-10-CM

## 2018-05-25 DIAGNOSIS — I10 Essential (primary) hypertension: Secondary | ICD-10-CM

## 2018-05-25 DIAGNOSIS — E559 Vitamin D deficiency, unspecified: Secondary | ICD-10-CM

## 2018-05-25 DIAGNOSIS — Z5181 Encounter for therapeutic drug level monitoring: Secondary | ICD-10-CM

## 2018-05-25 DIAGNOSIS — E785 Hyperlipidemia, unspecified: Secondary | ICD-10-CM

## 2018-05-26 ENCOUNTER — Ambulatory Visit: Payer: Medicare HMO

## 2018-05-27 ENCOUNTER — Ambulatory Visit: Payer: Medicare HMO

## 2018-05-31 ENCOUNTER — Encounter: Payer: Medicare HMO | Admitting: Family Medicine

## 2018-06-04 ENCOUNTER — Emergency Department (HOSPITAL_COMMUNITY)
Admission: EM | Admit: 2018-06-04 | Discharge: 2018-06-05 | Disposition: A | Discharge status: Home / Self Care | Payer: Medicare HMO | Attending: Emergency Medicine | Admitting: Emergency Medicine

## 2018-06-04 ENCOUNTER — Encounter (HOSPITAL_COMMUNITY): Payer: Self-pay | Admitting: Emergency Medicine

## 2018-06-04 ENCOUNTER — Other Ambulatory Visit: Payer: Self-pay

## 2018-06-04 DIAGNOSIS — S2001XA Contusion of right breast, initial encounter: Secondary | ICD-10-CM

## 2018-06-04 DIAGNOSIS — I10 Essential (primary) hypertension: Secondary | ICD-10-CM | POA: Diagnosis not present

## 2018-06-04 DIAGNOSIS — Y92002 Bathroom of unspecified non-institutional (private) residence single-family (private) house as the place of occurrence of the external cause: Secondary | ICD-10-CM | POA: Diagnosis not present

## 2018-06-04 DIAGNOSIS — E039 Hypothyroidism, unspecified: Secondary | ICD-10-CM | POA: Insufficient documentation

## 2018-06-04 DIAGNOSIS — S199XXA Unspecified injury of neck, initial encounter: Secondary | ICD-10-CM | POA: Diagnosis not present

## 2018-06-04 DIAGNOSIS — S0181XA Laceration without foreign body of other part of head, initial encounter: Secondary | ICD-10-CM | POA: Diagnosis not present

## 2018-06-04 DIAGNOSIS — R58 Hemorrhage, not elsewhere classified: Secondary | ICD-10-CM | POA: Diagnosis not present

## 2018-06-04 DIAGNOSIS — Z79899 Other long term (current) drug therapy: Secondary | ICD-10-CM | POA: Diagnosis not present

## 2018-06-04 DIAGNOSIS — S0083XA Contusion of other part of head, initial encounter: Secondary | ICD-10-CM | POA: Insufficient documentation

## 2018-06-04 DIAGNOSIS — S0990XA Unspecified injury of head, initial encounter: Secondary | ICD-10-CM | POA: Diagnosis not present

## 2018-06-04 DIAGNOSIS — W1839XA Other fall on same level, initial encounter: Secondary | ICD-10-CM | POA: Insufficient documentation

## 2018-06-04 DIAGNOSIS — W19XXXA Unspecified fall, initial encounter: Secondary | ICD-10-CM | POA: Diagnosis not present

## 2018-06-04 DIAGNOSIS — Y999 Unspecified external cause status: Secondary | ICD-10-CM | POA: Diagnosis not present

## 2018-06-04 DIAGNOSIS — Z8673 Personal history of transient ischemic attack (TIA), and cerebral infarction without residual deficits: Secondary | ICD-10-CM | POA: Insufficient documentation

## 2018-06-04 DIAGNOSIS — S0003XA Contusion of scalp, initial encounter: Secondary | ICD-10-CM | POA: Diagnosis not present

## 2018-06-04 DIAGNOSIS — Z7901 Long term (current) use of anticoagulants: Secondary | ICD-10-CM | POA: Insufficient documentation

## 2018-06-04 DIAGNOSIS — Y9389 Activity, other specified: Secondary | ICD-10-CM | POA: Insufficient documentation

## 2018-06-04 NOTE — ED Triage Notes (Addendum)
Fell about 2000. Fell reaching for something. Has lac and hematoma to forehead. Has held pressure. Bleeding controlled but still oozing. Pt is on warfarin. Wants to be checked just in case. Pressure dressing in place. Significant bruising to right breast which pt states is result of the fall earlier tonight as well.

## 2018-06-05 ENCOUNTER — Emergency Department (HOSPITAL_COMMUNITY): Payer: Medicare HMO

## 2018-06-05 DIAGNOSIS — S0083XA Contusion of other part of head, initial encounter: Secondary | ICD-10-CM | POA: Diagnosis not present

## 2018-06-05 DIAGNOSIS — S0003XA Contusion of scalp, initial encounter: Secondary | ICD-10-CM | POA: Diagnosis not present

## 2018-06-05 DIAGNOSIS — S2001XA Contusion of right breast, initial encounter: Secondary | ICD-10-CM | POA: Diagnosis not present

## 2018-06-05 DIAGNOSIS — S199XXA Unspecified injury of neck, initial encounter: Secondary | ICD-10-CM | POA: Diagnosis not present

## 2018-06-05 LAB — CBC WITH DIFFERENTIAL/PLATELET
ABS IMMATURE GRANULOCYTES: 0.02 10*3/uL (ref 0.00–0.07)
Basophils Absolute: 0 10*3/uL (ref 0.0–0.1)
Basophils Relative: 0 %
Eosinophils Absolute: 0.2 10*3/uL (ref 0.0–0.5)
Eosinophils Relative: 2 %
HCT: 43.7 % (ref 36.0–46.0)
HEMOGLOBIN: 13.5 g/dL (ref 12.0–15.0)
Immature Granulocytes: 0 %
LYMPHS PCT: 14 %
Lymphs Abs: 1.2 10*3/uL (ref 0.7–4.0)
MCH: 31.5 pg (ref 26.0–34.0)
MCHC: 30.9 g/dL (ref 30.0–36.0)
MCV: 101.9 fL — AB (ref 80.0–100.0)
MONO ABS: 0.5 10*3/uL (ref 0.1–1.0)
MONOS PCT: 6 %
NEUTROS ABS: 6.3 10*3/uL (ref 1.7–7.7)
Neutrophils Relative %: 78 %
Platelets: 156 10*3/uL (ref 150–400)
RBC: 4.29 MIL/uL (ref 3.87–5.11)
RDW: 12.8 % (ref 11.5–15.5)
WBC: 8.3 10*3/uL (ref 4.0–10.5)
nRBC: 0 % (ref 0.0–0.2)

## 2018-06-05 LAB — BASIC METABOLIC PANEL
ANION GAP: 10 (ref 5–15)
BUN: 16 mg/dL (ref 8–23)
CHLORIDE: 105 mmol/L (ref 98–111)
CO2: 20 mmol/L — ABNORMAL LOW (ref 22–32)
Calcium: 9 mg/dL (ref 8.9–10.3)
Creatinine, Ser: 0.99 mg/dL (ref 0.44–1.00)
GFR calc Af Amer: >60 mL/min (ref >60)
GFR calc non Af Amer: 56 mL/min — ABNORMAL LOW (ref >60)
GLUCOSE: 121 mg/dL — AB (ref 70–99)
POTASSIUM: 4.7 mmol/L (ref 3.5–5.1)
Sodium: 135 mmol/L (ref 135–145)

## 2018-06-05 LAB — PROTIME-INR
INR: 2.15
Prothrombin Time: 23.7 seconds — ABNORMAL HIGH (ref 11.4–15.2)

## 2018-06-05 MED ORDER — ACETAMINOPHEN 325 MG PO TABS
650.0000 mg | ORAL_TABLET | Freq: Once | ORAL | Status: AC
  Administered 2018-06-05: 650 mg via ORAL
  Filled 2018-06-05: qty 2

## 2018-06-05 MED ORDER — ACETAMINOPHEN 325 MG PO TABS: 650.0000 mg | ORAL_TABLET | Freq: Four times a day (QID) | ORAL | 0 refills | Status: AC | PRN

## 2018-06-05 NOTE — ED Provider Notes (Signed)
[facility] Provider Note   CSN: [id number] Arrival date & time: 06/04/18  2346     History   Chief Complaint Chief Complaint  Patient presents with  . Fall  . Head Injury    HPI Kathryn Mathews is a 72 y.o. female.  HPI  This is a 72 year old female with a history of PE on Coumadin, bipolar disorder who presents following a fall.  Patient reports that she was leaning over in her bathroom when she leaned over to far and hit her head on the floor.  She did not lose consciousness.  She reports also hitting her right breast.  She denies chest pain or shortness of breath.  Denies any recent fevers.  No history of syncope.  Patient rates her pain currently at 4 out of 10.  Mostly she is complaining of a headache.  She denies any neck pain.  Past Medical History:  Diagnosis Date  . Alopecia    well controlled per previous PCP - improved with biotin  . Anxiety    when around ppl  . Aorto-iliac atherosclerosis (HCC) 05/2016   by xray  . Bipolar disorder (HCC)    no psych  . Chronic sinusitis   . Closed left humeral fracture 2010   closed left humeral neck fracture, also h/o L wrist fx  . Closed right humeral fracture 2011   coracoid nondisplaced, conservative therapy (Dr. Cleon Gustinorning)  . Depression   . Diverticulosis    by colonoscopy  . Falls    completed HHPT 10/2011, declined outpt PT. completed outpt PT 08/2016.  Marland Kitchen. GERD (gastroesophageal reflux disease)   . Glucose intolerance (impaired glucose tolerance)   . High cholesterol   . History of chicken pox   . 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 HLD (hyperlipidemia)   . Humerus fracture   . Hyperactivity of bladder   . Hypothyroid    previously hyperthyroid  . Hypothyroidism   . Osteopenia 2010, 2016   DEXA 2010: T -1.3 L spine, -2.1  femoral neck. DEXA 2016: T -2.4 hip, T -0.5 spine; rpt 2 yrs  . PAD (peripheral artery disease) (HCC)   . Phlebitis    left leg stays swollen  . Positive ANA (antinuclear antibody)   . Pulmonary embolism [facility](HCC)     Patient Active Problem List   Diagnosis Date Noted  . Urine frequency 02/08/2018  . Systolic murmur 11/24/2017  . Bilateral sensorineural hearing loss 05/27/2017  . Memory deficit 05/27/2017  . Long term (current) use of anticoagulants 04/29/2017  . Gait disturbance 04/22/2017  . Right hip pain 06/13/2016  . Aorto-iliac atherosclerosis (HCC) 05/14/2016  . TIA (transient ischemic attack) 10/27/2015  . Health maintenance examination 04/12/2015  . Insomnia 04/12/2015  . Chronic nausea 09/09/2014  . Anginal chest pain at rest [facility](HCC) 09/09/2014  . Shortness of breath   . Abnormal nuclear stress test 09/04/2014  . Essential hypertension 09/02/2014  . Skin rash 05/29/2014  . Advanced care planning/counseling discussion 04/10/2014  . Recurrent falls 03/28/2014  . Encounter for therapeutic drug monitoring 08/22/2013  . Ventral hernia 09/07/2012  . Unspecified constipation 09/07/2012  . Abdominal discomfort 08/09/2012  . Spondylolisthesis, grade 3 07/15/2012  . Compression fracture of L1 lumbar vertebra (HCC) 07/15/2012  . Loss of taste 03/09/2012  . Imbalance  09/18/2011  . Dysphagia 07/21/2011  . Medicare annual wellness visit, subsequent 02/18/2011  . Vitamin D deficiency 02/18/2011  . Osteopenia   . Bipolar disorder (HCC)   . GERD (gastroesophageal reflux disease)   . HLD (hyperlipidemia)   . Hypothyroid   . History of DVT (deep vein thrombosis)   . PAD (peripheral artery disease) (HCC)     Past Surgical History:  Procedure Laterality Date  . ARTERIAL THROMBECTOMY  07/2009   with R mid popliteal artery angioplasty [balloon] (2011)[[[[  . CARDIAC CATHETERIZATION  08/2014   nl EF, small branch OM not amenable to PCI [name])  . CESAREAN SECTION    . CESAREAN  SECTION  1973  . CHOLECYSTECTOMY    . COLONOSCOPY  08/2008   int/ext hem, severe diverticulosis with evidence of itis, rec rpt 10 yrs  . hospitalization  01/2007   depression, ECT  . INGUINAL HERNIA REPAIR  1980s   x2 on right  . LEFT HEART CATHETERIZATION WITH CORONARY ANGIOGRAM N/A 09/05/2014   Procedure: LEFT HEART CATHETERIZATION WITH CORONARY ANGIOGRAM;  Surgeon: Marykay Lex, MD;  Location: [facility] CATH LAB;  Service: Cardiovascular;  Laterality: N/A;     OB History    Gravida  0   Para  0   Term  0   Preterm  0   AB  0   Living        SAB  0   TAB  0   Ectopic  0   Multiple      Live Births               Home Medications    Prior to Admission medications   Medication Sig Start Date End Date Taking? Authorizing Provider  acetaminophen (TYLENOL) 325 MG tablet Take 2 tablets (650 mg total) by mouth every 6 (six) hours as needed. 06/05/18   , Mayer Masker, MD  acetaminophen (TYLENOL) 500 MG tablet Take 500 mg by mouth every 8 (eight) hours as needed for moderate pain.    [provider]  Artificial Tear Solution (SOOTHE XP OP) Apply 1 drop to eye 2 (two) times daily as needed.    [provider]  atorvastatin (LIPITOR) 20 MG tablet Take 1 tablet (20 mg total) by mouth daily. 11/24/17   Eustaquio Boyden, MD  Biotin 1000 MCG tablet Take 1 tablet (1 mg total) by mouth 2 (two) times daily. 04/24/16   Eustaquio Boyden, MD  buPROPion [facility] SR) 200 MG 12 hr tablet Take 1 tablet (200 mg total) by mouth 2 (two) times daily. 04/29/18   Clapacs, Jackquline Denmark, MD  Calcium Carb-Cholecalciferol (CALCIUM-VITAMIN D) 600-400 MG-UNIT TABS Take 1 tablet by mouth daily. 11/24/17   Eustaquio Boyden, MD  cephALEXin (KEFLEX) 500 MG capsule Take 1 capsule (500 mg total) by mouth 2 (two) times daily. 02/08/18   Eustaquio Boyden, MD  Cholecalciferol (VITAMIN D) 2000 units CAPS Take 1 capsule (2,000 Units total) by mouth daily. 04/24/16   Eustaquio Boyden, MD    clonazePAM (KLONOPIN) 0.5 MG tablet Take 1 tablet (0.5 mg total) by mouth 2 (two) times daily. 04/29/18   Clapacs, Jackquline Denmark, MD  Cyanocobalamin (VITAMIN B-12 PO)  09/20/17   [provider]  cyclobenzaprine (FLEXERIL) 5 MG tablet Take 1 tablet (5 mg total) by mouth 2 (two) times daily. 04/23/17   Rai, Ripudeep K, MD  diclofenac sodium (VOLTAREN) 1 % GEL Apply 1 application topically 2 (two) times daily as needed. Patient taking differently: Apply 1  application topically 2 (two) times daily as needed (pain).  06/13/16   Eustaquio Boyden, MD  Docusate Calcium (STOOL SOFTENER PO) Take 1 tablet by mouth daily as needed (constipation). Reported on 11/05/2015    [provider]  lamoTRIgine (LAMICTAL) 100 MG tablet Take 1 tablet (100 mg total) by mouth 2 (two) times daily. 04/29/18   Clapacs, Jackquline Denmark, MD  levothyroxine (SYNTHROID, LEVOTHROID) 50 MCG tablet Take 1 tablet (50 mcg total) daily by mouth. 05/27/17   Eustaquio Boyden, MD  PRESCRIPTION MEDICATION Place 2 drops into both eyes daily as needed (dryness). Fresh coat    [provider]  Propylene Glycol (SYSTANE COMPLETE OP) Apply 2 drops to eye 2 (two) times daily as needed.    [provider]  traMADol (ULTRAM) 50 MG tablet Take 1 tablet (50 mg total) by mouth 2 (two) times daily as needed for moderate pain. 04/23/17   Rai, Delene Ruffini, MD  traZODone (DESYREL) 100 MG tablet Take 0.5 tablets (50 mg total) by mouth at bedtime. 04/29/18   Clapacs, Jackquline Denmark, MD  warfarin (COUMADIN) 5 MG tablet TAKE DAILY AS DIRECTED BY COUMADIN CLINIC.  Take 1-2 pills daily as directed by coumadin clinic. 11/24/17   Eustaquio Boyden, MD    Family History Family History  Problem Relation Age of Onset  . Cancer Father   . Heart attack Mother 53  . Coronary artery disease Mother 2  . Hyperlipidemia Mother   . Heart disease Mother   . Hypertension Mother   . Breast cancer Maternal Aunt 54  . Coronary artery disease Maternal Uncle    . Coronary artery disease Maternal Grandmother 70  . Stroke Neg Hx   . Diabetes Neg Hx     Social History Social History   Tobacco Use  . Smoking status: Never Smoker  . Smokeless tobacco: Never Used  Substance Use Topics  . Alcohol use: No    Alcohol/week: 0.0 standard drinks  . Drug use: No     Allergies   Codeine; Acyclovir and related; and Hydrocodone   Review of Systems Review of Systems  Constitutional: Negative for fever.  HENT: Positive for facial swelling.   Respiratory: Negative for shortness of breath.   Cardiovascular: Negative for chest pain.  Gastrointestinal: Negative for abdominal pain, nausea and vomiting.  Skin: Positive for color change.       Breast and forehead bruising  Neurological: Positive for headaches. Negative for dizziness.  All other systems reviewed and are negative.    Physical Exam Updated Vital Signs BP (!) 145/76   Pulse 73   Temp 98.5 F (36.9 C) (Oral)   Resp 17   SpO2 96%   Physical Exam  Constitutional: She is oriented to person, place, and time.  ABCs intact  HENT:  Head: Normocephalic.  Large hematoma over the forehead with less than 1 cm superficial laceration, bleeding controlled at this time, ecchymosis standing inferiorly around the bilateral orbits  Eyes: Pupils are equal, round, and reactive to light.  Equals 4 mm reactive bilaterally, extraocular movements intact  Neck: Neck supple.  Ernest palpation lower cervical spine, no step-off or deformity  Cardiovascular: Normal rate, regular rhythm and normal heart sounds.  Pulmonary/Chest: Effort normal and breath sounds normal. No respiratory distress. She has no wheezes. She exhibits tenderness.  Extensive bruising noted over the right breast, no crepitus  Abdominal: Soft. Bowel sounds are normal.  Erythema noted over the right side of the abdomen just under the right breast, blanching,  non-weeping  Musculoskeletal: She exhibits no edema or deformity.  Normal  range of motion of bilateral hips and knees  Neurological: She is alert and oriented to person, place, and time.  Skin: Skin is warm and dry.  Psychiatric: She has a normal mood and affect.  Nursing note and vitals reviewed.    ED Treatments / Results  Labs (all labs ordered are listed, but only abnormal results are displayed) Labs Reviewed  CBC WITH DIFFERENTIAL/PLATELET - Abnormal; Notable for the following components:      Result Value   MCV 101.9 (*)    All other components within normal limits  BASIC METABOLIC PANEL - Abnormal; Notable for the following components:   CO2 20 (*)    Glucose, Bld 121 (*)    GFR calc non Af Amer 56 (*)    All other components within normal limits  PROTIME-INR - Abnormal; Notable for the following components:   Prothrombin Time 23.7 (*)    All other components within normal limits    EKG None  Radiology Dg Chest 2 View  Result Date: 06/05/2018 CLINICAL DATA:  Larey Seat earlier tonight.  Bruising to the right breast. EXAM: CHEST - 2 VIEW COMPARISON:  10/27/2015 FINDINGS: Shallow inspiration. Heart size and pulmonary vascularity are normal. Lungs are clear and expanded. No blunting of costophrenic angles. No pneumothorax. Calcification of the aorta. Old fracture deformity of the left proximal humerus. Anterior compression of an upper lumbar vertebrae unchanged since prior study. IMPRESSION: No active cardiopulmonary disease. Electronically Signed   By: Burman Nieves M.D.   On: 06/05/2018 01:20   Ct Head Wo Contrast  Result Date: 06/05/2018 CLINICAL DATA:  Fall, laceration and hematoma to forehead. EXAM: CT HEAD WITHOUT CONTRAST CT CERVICAL SPINE WITHOUT CONTRAST TECHNIQUE: Multidetector CT imaging of the head and cervical spine was performed following the standard protocol without intravenous contrast. Multiplanar CT image reconstructions of the cervical spine were also generated. COMPARISON:  None. FINDINGS: CT HEAD FINDINGS Brain: Generalized  parenchymal volume loss with commensurate dilatation of the ventricles and sulci. No mass, hemorrhage, edema or other evidence of acute parenchymal abnormality. No extra-axial hemorrhage. Vascular: Chronic calcified atherosclerotic changes of the large vessels at the skull base. No unexpected hyperdense vessel. Skull: Normal. Negative for fracture or focal lesion. Sinuses/Orbits: No acute finding. Other: Soft tissue laceration overlying the frontal bone. No underlying fracture. Masslike densities within the scalp overlying the upper LEFT frontal bone/vertex, presumably additional hematoma. CT CERVICAL SPINE FINDINGS Alignment: No evidence of acute vertebral body subluxation. Skull base and vertebrae: No fracture line or displaced fracture fragment. Soft tissues and spinal canal: No prevertebral fluid or swelling. No visible canal hematoma. Disc levels: Disc desiccations at the C4-5 and C5-6 levels, with significant disc space narrowing, articular surface sclerosis and disc-osteophytic bulges, with mild to moderate central canal stenoses. Associated mild retrolisthesis of C4. Upper chest: No acute findings.  Aortic atherosclerosis. Other: Carotid atherosclerosis. IMPRESSION: 1. Soft tissue defect/laceration overlying the lower LEFT frontal bone. No underlying skull fracture. 2. Additional masslike densities within the scalp overlying the upper LEFT frontal bone (vertex), largest measuring 2 cm in size, presumably additional scalp hematomas. No underlying fracture. 3. No acute intracranial abnormality. No intracranial hemorrhage or edema. 4. No fracture or acute subluxation within the cervical spine. Degenerative changes within the mid and lower cervical spine, as detailed above. 5. Aortic atherosclerosis. 6. Carotid atherosclerosis. Electronically Signed   By: Bary Richard M.D.   On: 06/05/2018 01:38   Ct Cervical  Spine Wo Contrast  Result Date: 06/05/2018 CLINICAL DATA:  Fall, laceration and hematoma to  forehead. EXAM: CT HEAD WITHOUT CONTRAST CT CERVICAL SPINE WITHOUT CONTRAST TECHNIQUE: Multidetector CT imaging of the head and cervical spine was performed following the standard protocol without intravenous contrast. Multiplanar CT image reconstructions of the cervical spine were also generated. COMPARISON:  None. FINDINGS: CT HEAD FINDINGS Brain: Generalized parenchymal volume loss with commensurate dilatation of the ventricles and sulci. No mass, hemorrhage, edema or other evidence of acute parenchymal abnormality. No extra-axial hemorrhage. Vascular: Chronic calcified atherosclerotic changes of the large vessels at the skull base. No unexpected hyperdense vessel. Skull: Normal. Negative for fracture or focal lesion. Sinuses/Orbits: No acute finding. Other: Soft tissue laceration overlying the frontal bone. No underlying fracture. Masslike densities within the scalp overlying the upper LEFT frontal bone/vertex, presumably additional hematoma. CT CERVICAL SPINE FINDINGS Alignment: No evidence of acute vertebral body subluxation. Skull base and vertebrae: No fracture line or displaced fracture fragment. Soft tissues and spinal canal: No prevertebral fluid or swelling. No visible canal hematoma. Disc levels: Disc desiccations at the C4-5 and C5-6 levels, with significant disc space narrowing, articular surface sclerosis and disc-osteophytic bulges, with mild to moderate central canal stenoses. Associated mild retrolisthesis of C4. Upper chest: No acute findings.  Aortic atherosclerosis. Other: Carotid atherosclerosis. IMPRESSION: 1. Soft tissue defect/laceration overlying the lower LEFT frontal bone. No underlying skull fracture. 2. Additional masslike densities within the scalp overlying the upper LEFT frontal bone (vertex), largest measuring 2 cm in size, presumably additional scalp hematomas. No underlying fracture. 3. No acute intracranial abnormality. No intracranial hemorrhage or edema. 4. No fracture or  acute subluxation within the cervical spine. Degenerative changes within the mid and lower cervical spine, as detailed above. 5. Aortic atherosclerosis. 6. Carotid atherosclerosis. Electronically Signed   By: Bary Richard M.D.   On: 06/05/2018 01:38    Procedures .Marland KitchenLaceration Repair Date/Time: 06/05/2018 2:42 AM Performed by: Shon Baton, MD Authorized by: Shon Baton, MD   Consent:    Consent obtained:  Verbal   Consent given by:  Patient   Risks discussed:  Pain and poor cosmetic result   Alternatives discussed:  No treatment Anesthesia (see MAR for exact dosages):    Anesthesia method:  None Laceration details:    Location:  Face   Face location:  Forehead   Length (cm):  0.5   Depth (mm):  1   (including critical care time)  Medications Ordered in ED Medications  acetaminophen (TYLENOL) tablet 650 mg (650 mg Oral Given 06/05/18 0146)     Initial Impression / Assessment and Plan / ED Course  I have reviewed the triage vital signs and the nursing notes.  Pertinent labs & imaging results that were available during my care of the patient were reviewed by me and considered in my medical decision making (see chart for details).     Patient presents after reported mechanical fall.  She is overall nontoxic-appearing and ABCs are intact.  She has extensive hematoma and bruising to the forehead which is now extending inferiorly to the bilateral orbits.  No active bleeding noted although she does have a small laceration.  She also has a contusion to the right breast.  CT scans were obtained.  No intracranial injury.  Laceration was repaired with Dermabond and a dressing was placed.  Chest x-ray shows no evidence of pneumothorax, rib fracture.  Patient reports that she is at baseline.  She normally relates with the assistance  of a walker.  She is able to ambulate without any difficulty and has no further complaints.  I discussed with her and her husband that she will be  very bruised over the next week.  Recommend ice to contusions and hematoma.  No adjustment in Coumadin at this time.  After history, exam, and medical workup I feel the patient has been appropriately medically screened and is safe for discharge home. Pertinent diagnoses were discussed with the patient. Patient was given return precautions.   Final Clinical Impressions(s) / ED Diagnoses   Final diagnoses:  Injury of head, initial encounter  Traumatic hematoma of forehead, initial encounter  Laceration of forehead, initial encounter  Contusion of right breast, initial encounter    ED Discharge Orders         Ordered    acetaminophen (TYLENOL) 325 MG tablet  Every 6 hours PRN     06/05/18 0238           Shon Baton, MD 06/05/18 [phone]

## 2018-06-05 NOTE — ED Notes (Signed)
Dermabond at bedside.  

## 2018-06-05 NOTE — Discharge Instructions (Addendum)
You were seen today after a fall.  You have extensive bruising and collection of blood every her forehead and face.  This will improve with time.  Use ice.  Your laceration was repaired with glue.  Glue will loosen and dissolve with time.  Make sure that you are using your walker when you get up and ambulate.

## 2018-06-17 ENCOUNTER — Ambulatory Visit (INDEPENDENT_AMBULATORY_CARE_PROVIDER_SITE_OTHER): Payer: Medicare HMO | Admitting: Family Medicine

## 2018-06-17 ENCOUNTER — Encounter: Payer: Self-pay | Admitting: Family Medicine

## 2018-06-17 ENCOUNTER — Ambulatory Visit (INDEPENDENT_AMBULATORY_CARE_PROVIDER_SITE_OTHER): Payer: Medicare HMO | Admitting: General Practice

## 2018-06-17 VITALS — BP 132/72 | HR 68 | Temp 98.4°F | Ht <= 58 in | Wt 150.8 lb

## 2018-06-17 DIAGNOSIS — S0181XA Laceration without foreign body of other part of head, initial encounter: Secondary | ICD-10-CM

## 2018-06-17 DIAGNOSIS — Z7901 Long term (current) use of anticoagulants: Secondary | ICD-10-CM

## 2018-06-17 DIAGNOSIS — S0990XA Unspecified injury of head, initial encounter: Secondary | ICD-10-CM | POA: Insufficient documentation

## 2018-06-17 DIAGNOSIS — R791 Abnormal coagulation profile: Secondary | ICD-10-CM

## 2018-06-17 DIAGNOSIS — R269 Unspecified abnormalities of gait and mobility: Secondary | ICD-10-CM

## 2018-06-17 DIAGNOSIS — I739 Peripheral vascular disease, unspecified: Secondary | ICD-10-CM | POA: Diagnosis not present

## 2018-06-17 DIAGNOSIS — R296 Repeated falls: Secondary | ICD-10-CM

## 2018-06-17 LAB — POCT INR: INR: 5.6 — AB (ref 2.0–3.0)

## 2018-06-17 NOTE — Assessment & Plan Note (Signed)
Suffered another all with injury. ER records reviewed. Head and neck CT reassuringly without fracture. Continue recovering. Discussed tylenol use.

## 2018-06-17 NOTE — Assessment & Plan Note (Signed)
This seems to be healing well without complication.

## 2018-06-17 NOTE — Progress Notes (Signed)
BP 132/72 (BP Location: Left Arm, Patient Position: Sitting, Cuff Size: Normal)   Pulse 68   Temp 98.4 F (36.9 C) (Oral)   Ht 4\' 8"  (1.422 m)   Wt 150 lb 12 oz (68.4 kg)   SpO2 98%   BMI 33.80 kg/m    CC: ER f/u visit Subjective:    Patient ID: Kathryn Mathews, female    DOB: 04-28-1946, 72 y.o.   MRN: [id number]  HPI: Kathryn Mathews is a 72 y.o. female presenting on 06/17/2018 for Hospitalization Follow-up ([facility]18. Pt accompnied by her husband, Kathryn Mathews.)   Seen at ER 06/04/2018 after suffering fall in bathroom with head injury - tipped forward while leaning over looking into cupboard, hit head on wall. No LOC. Records reviewed. Normal CXR, head/neck CT without acute fracture but with several scalp hematomas. R breast contusion. She suffered forehead laceration repaired with dermabond. INR at that time 2.1.   INR today very elevated to 5.6! - she has been instructed to hold coumadin for 3 days then restart 1 tablet daily (5mg ), 1.5 tablets on MWF.   Husband manages coumadin, she has had trouble managing meds on her own.  Rare tramadol use.  Denies clear discharge/drainage from nose.   Lab Results  Component Value Date   INR 5.6 (A) 06/17/2018   INR 2.15 06/05/2018   INR 3.4 (A) 04/29/2018   Relevant past medical, surgical, family and social history reviewed and updated as indicated. Interim medical history since our last visit reviewed. Allergies and medications reviewed and updated. Outpatient Medications Prior to Visit  Medication Sig Dispense Refill  . acetaminophen (TYLENOL) 325 MG tablet Take 2 tablets (650 mg total) by mouth every 6 (six) hours as needed. 30 tablet 0  . acetaminophen (TYLENOL) 500 MG tablet Take 500 mg by mouth every 8 (eight) hours as needed for moderate pain.    . Artificial Tear Solution (SOOTHE XP OP) Apply 1 drop to eye 2 (two) times daily as needed.    Marland Kitchen. atorvastatin (LIPITOR) 20 MG tablet Take 1 tablet (20 mg total) by mouth  daily. 90 tablet 1  . Biotin 1000 MCG tablet Take 1 tablet (1 mg total) by mouth 2 (two) times daily. 180 tablet 3  . buPROPion (WELLBUTRIN SR) 200 MG 12 hr tablet Take 1 tablet (200 mg total) by mouth 2 (two) times daily. 180 tablet 1  . Calcium Carb-Cholecalciferol (CALCIUM-VITAMIN D) 600-400 MG-UNIT TABS Take 1 tablet by mouth daily. 60 tablet   . Cholecalciferol (VITAMIN D) 2000 units CAPS Take 1 capsule (2,000 Units total) by mouth daily. 90 capsule 3  . clonazePAM (KLONOPIN) 0.5 MG tablet Take 1 tablet (0.5 mg total) by mouth 2 (two) times daily. 180 tablet 1  . Cyanocobalamin (VITAMIN B-12 PO)     . cyclobenzaprine (FLEXERIL) 5 MG tablet Take 1 tablet (5 mg total) by mouth 2 (two) times daily. 60 tablet 0  . diclofenac sodium (VOLTAREN) 1 % GEL Apply 1 application topically 2 (two) times daily as needed. (Patient taking differently: Apply 1 application topically 2 (two) times daily as needed (pain). ) 1 Tube 1  . Docusate Calcium (STOOL SOFTENER PO) Take 1 tablet by mouth daily as needed (constipation). Reported on 11/05/2015    . lamoTRIgine (LAMICTAL) 100 MG tablet Take 1 tablet (100 mg total) by mouth 2 (two) times daily. 180 tablet 1  . levothyroxine (SYNTHROID, LEVOTHROID) 50 MCG tablet Take 1 tablet (50 mcg total) daily  by mouth. 90 tablet 3  . PRESCRIPTION MEDICATION Place 2 drops into both eyes daily as needed (dryness). Fresh coat    . Propylene Glycol (SYSTANE COMPLETE OP) Apply 2 drops to eye 2 (two) times daily as needed.    . traMADol (ULTRAM) 50 MG tablet Take 1 tablet (50 mg total) by mouth 2 (two) times daily as needed for moderate pain. 20 tablet 0  . warfarin (COUMADIN) 5 MG tablet TAKE DAILY AS DIRECTED BY COUMADIN CLINIC.  Take 1-2 pills daily as directed by coumadin clinic. 120 tablet 3  . cephALEXin (KEFLEX) 500 MG capsule Take 1 capsule (500 mg total) by mouth 2 (two) times daily. 14 capsule 0  . traZODone (DESYREL) 100 MG tablet Take 0.5 tablets (50 mg total) by mouth  at bedtime. 45 tablet 1   No facility-administered medications prior to visit.      Per HPI unless specifically indicated in ROS section below Review of Systems     Objective:    BP 132/72 (BP Location: Left Arm, Patient Position: Sitting, Cuff Size: Normal)   Pulse 68   Temp 98.4 F (36.9 C) (Oral)   Ht 4\' 8"  (1.422 m)   Wt 150 lb 12 oz (68.4 kg)   SpO2 98%   BMI 33.80 kg/m   Wt Readings from Last 3 Encounters:  06/17/18 150 lb 12 oz (68.4 kg)  02/08/18 146 lb 4 oz (66.3 kg)  11/24/17 148 lb 8 oz (67.4 kg)    Physical Exam  Constitutional: She appears well-developed and well-nourished. No distress.  HENT:  Head: Head is with contusion.  Mouth/Throat: Oropharynx is clear and moist. No oropharyngeal exudate.  Bruising below bilateral eyes Bruising behind L ear Hematoma L forehead Laceration to forehead healing well, steri strips present No pain to palpation at base of skull  Cardiovascular: Normal rate and regular rhythm.  Murmur (3/6 systolic) heard. Pulmonary/Chest: Effort normal and breath sounds normal. No respiratory distress. She has no wheezes. She has no rales.  Musculoskeletal: She exhibits no edema.  Psychiatric: She has a normal mood and affect.  Nursing note and vitals reviewed.  Results for orders placed or performed in visit on 06/17/18  POCT INR  Result Value Ref Range   INR 5.6 (A) 2.0 - 3.0      Assessment & Plan:   Problem List Items Addressed This Visit    Supratherapeutic INR    INR 5.6. Unclear cause why. Coumadin has been held. Planned recheck next week.       Recurrent falls    Suffered another all with injury. ER records reviewed. Head and neck CT reassuringly without fracture. Continue recovering. Discussed tylenol use.       Head injury - Primary    Suffered head injury 06/04/2018 - overall improving with some residual posterior headaches. She has new appearing bruising behind L ear ?Battle sign - doubtful given overall improving.  No signs of CSF leak, no significant pain to palpation at base of skull. Advised close monitoring and if worsening headaches or new symptoms, low threshold to re-image r/o base of skull fracture. Pt and husband agree with plan.       Gait disturbance    She has stopped trazodone - felt this was contributing to gait unsteadiness.      Forehead laceration, initial encounter    This seems to be healing well without complication.          No orders of the defined types were placed  in this encounter.  No orders of the defined types were placed in this encounter.   Follow up plan: No follow-ups on file.  Eustaquio Boyden, MD

## 2018-06-17 NOTE — Assessment & Plan Note (Signed)
Suffered head injury 06/04/2018 - overall improving with some residual posterior headaches. She has new appearing bruising behind L ear ?Battle sign - doubtful given overall improving. No signs of CSF leak, no significant pain to palpation at base of skull. Advised close monitoring and if worsening headaches or new symptoms, low threshold to re-image r/o base of skull fracture. Pt and husband agree with plan.

## 2018-06-17 NOTE — Assessment & Plan Note (Signed)
INR 5.6. Unclear cause why. Coumadin has been held. Planned recheck next week.

## 2018-06-17 NOTE — Assessment & Plan Note (Signed)
She has stopped trazodone - felt this was contributing to gait unsteadiness.

## 2018-06-17 NOTE — Patient Instructions (Addendum)
Pre visit review using our clinic review tool, if applicable. No additional management support is needed unless otherwise documented below in the visit note.  Hold coumadin today, tomorrow and Saturday.  On Sunday continue to take 1 pill daily EXCEPT for 1.5 pills (7.5mg ) on Monday/Wednesday/ Fridays.  Recheck in 1 week.

## 2018-06-17 NOTE — Patient Instructions (Signed)
Continue tylenol for headache, let me know if not getting better over time or especially if worsening.  INR was very high - hold coumadin as instructed, return next week for coumadin check  Good to see you today, call us with questions.

## 2018-06-17 NOTE — Assessment & Plan Note (Deleted)
ER records reviewed. Head and neck CT reassuringly without fracture. Continue recovering. Discussed tylenol use.

## 2018-06-21 ENCOUNTER — Ambulatory Visit: Payer: Medicare HMO

## 2018-06-24 ENCOUNTER — Ambulatory Visit (INDEPENDENT_AMBULATORY_CARE_PROVIDER_SITE_OTHER): Payer: Medicare HMO | Admitting: General Practice

## 2018-06-24 DIAGNOSIS — I739 Peripheral vascular disease, unspecified: Secondary | ICD-10-CM

## 2018-06-24 DIAGNOSIS — Z7901 Long term (current) use of anticoagulants: Secondary | ICD-10-CM | POA: Diagnosis not present

## 2018-06-24 LAB — POCT INR: INR: 2.5 (ref 2.0–3.0)

## 2018-06-24 NOTE — Patient Instructions (Addendum)
Pre visit review using our clinic review tool, if applicable. No additional management support is needed unless otherwise documented below in the visit note.  Continue to take 1 pill daily EXCEPT for 1.5 pills (7.5mg ) on Monday/Wednesday/ Fridays.  Recheck in 2 to 3 weeks.

## 2018-06-30 ENCOUNTER — Ambulatory Visit (INDEPENDENT_AMBULATORY_CARE_PROVIDER_SITE_OTHER): Payer: Medicare HMO

## 2018-06-30 VITALS — BP 118/80 | HR 52 | Temp 98.2°F | Ht <= 58 in | Wt 148.8 lb

## 2018-06-30 DIAGNOSIS — E039 Hypothyroidism, unspecified: Secondary | ICD-10-CM

## 2018-06-30 DIAGNOSIS — I10 Essential (primary) hypertension: Secondary | ICD-10-CM | POA: Diagnosis not present

## 2018-06-30 DIAGNOSIS — Z5181 Encounter for therapeutic drug level monitoring: Secondary | ICD-10-CM | POA: Diagnosis not present

## 2018-06-30 DIAGNOSIS — E785 Hyperlipidemia, unspecified: Secondary | ICD-10-CM

## 2018-06-30 DIAGNOSIS — Z0000 Encounter for general adult medical examination without abnormal findings: Secondary | ICD-10-CM | POA: Diagnosis not present

## 2018-06-30 DIAGNOSIS — E559 Vitamin D deficiency, unspecified: Secondary | ICD-10-CM | POA: Diagnosis not present

## 2018-06-30 LAB — COMPREHENSIVE METABOLIC PANEL
ALT: 12 U/L (ref 0–35)
AST: 18 U/L (ref 0–37)
Albumin: 4.2 g/dL (ref 3.5–5.2)
Alkaline Phosphatase: 65 U/L (ref 39–117)
BUN: 18 mg/dL (ref 6–23)
CALCIUM: 10 mg/dL (ref 8.4–10.5)
CHLORIDE: 102 meq/L (ref 96–112)
CO2: 29 meq/L (ref 19–32)
CREATININE: 0.89 mg/dL (ref 0.40–1.20)
GFR: 66.14 mL/min (ref >60.00)
GLUCOSE: 105 mg/dL — AB (ref 70–99)
Potassium: 4.7 mEq/L (ref 3.5–5.1)
SODIUM: 139 meq/L (ref 135–145)
Total Bilirubin: 0.5 mg/dL (ref 0.2–1.2)
Total Protein: 7.2 g/dL (ref 6.0–8.3)

## 2018-06-30 LAB — CBC WITH DIFFERENTIAL/PLATELET
BASOS ABS: 0 10*3/uL (ref 0.0–0.1)
Basophils Relative: 0.4 % (ref 0.0–3.0)
EOS ABS: 0.3 10*3/uL (ref 0.0–0.7)
Eosinophils Relative: 4.7 % (ref 0.0–5.0)
HCT: 43.3 % (ref 36.0–46.0)
Hemoglobin: 14.3 g/dL (ref 12.0–15.0)
LYMPHS ABS: 1.6 10*3/uL (ref 0.7–4.0)
Lymphocytes Relative: 28.1 % (ref 12.0–46.0)
MCHC: 33.1 g/dL (ref 30.0–36.0)
MCV: 97.6 fl (ref 78.0–100.0)
MONO ABS: 0.5 10*3/uL (ref 0.1–1.0)
Monocytes Relative: 8.2 % (ref 3.0–12.0)
NEUTROS PCT: 58.6 % (ref 43.0–77.0)
Neutro Abs: 3.4 10*3/uL (ref 1.4–7.7)
PLATELETS: 249 10*3/uL (ref 150.0–400.0)
RBC: 4.44 Mil/uL (ref 3.87–5.11)
RDW: 14.5 % (ref 11.5–15.5)
WBC: 5.8 10*3/uL (ref 4.0–10.5)

## 2018-06-30 LAB — MICROALBUMIN / CREATININE URINE RATIO
Creatinine,U: 51.5 mg/dL
MICROALB UR: 0.8 mg/dL (ref 0.0–1.9)
Microalb Creat Ratio: 1.6 mg/g (ref 0.0–30.0)

## 2018-06-30 LAB — LIPID PANEL
CHOL/HDL RATIO: 3
Cholesterol: 176 mg/dL (ref 0–200)
HDL: 53.9 mg/dL (ref >39.00)
LDL Cholesterol: 87 mg/dL (ref 0–99)
NONHDL: 122.04
TRIGLYCERIDES: 175 mg/dL — AB (ref 0.0–149.0)
VLDL: 35 mg/dL (ref 0.0–40.0)

## 2018-06-30 LAB — VITAMIN D 25 HYDROXY (VIT D DEFICIENCY, FRACTURES): VITD: 33.91 ng/mL (ref 30.00–100.00)

## 2018-06-30 LAB — TSH: TSH: 1.62 u[IU]/mL (ref 0.35–4.50)

## 2018-06-30 NOTE — Patient Instructions (Signed)
[name] , Thank you for taking time to come for your Medicare Wellness Visit. I appreciate your ongoing commitment to your health goals. Please review the following plan we discussed and let me know if I can assist you in the future.   These are the goals we discussed: Goals    . Patient Stated     Starting 06/30/2018, I will continue to take medications as prescribed.        This is a list of the screening recommended for you and due dates:  Health Maintenance  Topic Date Due  . DTaP/Tdap/Td vaccine (1 - Tdap) 08/13/2021*  . Mammogram  07/13/2049*  . Colon Cancer Screening  08/25/2018  . Tetanus Vaccine  08/13/2021  . Flu Shot  Completed  . DEXA scan (bone density measurement)  Completed  .  Hepatitis C: One time screening is recommended by Center for Disease Control  (CDC) for  adults born from [birth year] through 1965.   Completed  . Pneumonia vaccines  Completed  *Topic was postponed. The date shown is not the original due date.   Preventive Care for Adults  A healthy lifestyle and preventive care can promote health and wellness. Preventive health guidelines for adults include the following key practices.  . A routine yearly physical is a good way to check with your health care provider about your health and preventive screening. It is a chance to share any concerns and updates on your health and to receive a thorough exam.  . Visit your dentist for a routine exam and preventive care every 6 months. Brush your teeth twice a day and floss once a day. Good oral hygiene prevents tooth decay and gum disease.  . The frequency of eye exams is based on your age, health, family medical history, use  of contact lenses, and other factors. Follow your health care provider's recommendations for frequency of eye exams.  . Eat a healthy diet. Foods like vegetables, fruits, whole grains, low-fat dairy products, and lean protein foods contain the nutrients you need without too many calories.  Decrease your intake of foods high in solid fats, added sugars, and salt. Eat the right amount of calories for you. Get information about a proper diet from your health care provider, if necessary.  . Regular physical exercise is one of the most important things you can do for your health. Most adults should get at least 150 minutes of moderate-intensity exercise (any activity that increases your heart rate and causes you to sweat) each week. In addition, most adults need muscle-strengthening exercises on 2 or more days a week.  Silver Sneakers may be a benefit available to you. To determine eligibility, you may visit the website: www.silversneakers.com or contact program at [phone] Mon-Fri between 8AM-8PM.   . Maintain a healthy weight. The body mass index (BMI) is a screening tool to identify possible weight problems. It provides an estimate of body fat based on height and weight. Your health care provider can find your BMI and can help you achieve or maintain a healthy weight.   For adults 20 years and older: ? A BMI below 18.5 is considered underweight. ? A BMI of 18.5 to 24.9 is normal. ? A BMI of 25 to 29.9 is considered overweight. ? A BMI of 30 and above is considered obese.   . Maintain normal blood lipids and cholesterol levels by exercising and minimizing your intake of saturated fat. Eat a balanced diet with plenty of fruit and vegetables. Blood  tests for lipids and cholesterol should begin at age 18 and be repeated every 5 years. If your lipid or cholesterol levels are high, you are over 50, or you are at high risk for heart disease, you may need your cholesterol levels checked more frequently. Ongoing high lipid and cholesterol levels should be treated with medicines if diet and exercise are not working.  . If you smoke, find out from your health care provider how to quit. If you do not use tobacco, please do not start.  . If you choose to drink alcohol, please do not consume  more than 2 drinks per day. One drink is considered to be 12 ounces (355 mL) of beer, 5 ounces (148 mL) of wine, or 1.5 ounces (44 mL) of liquor.  . If you are 74-55 years old, ask your health care provider if you should take aspirin to prevent strokes.  . Use sunscreen. Apply sunscreen liberally and repeatedly throughout the day. You should seek shade when your shadow is shorter than you. Protect yourself by wearing long sleeves, pants, a wide-brimmed hat, and sunglasses year round, whenever you are outdoors.  . Once a month, do a whole body skin exam, using a mirror to look at the skin on your back. Tell your health care provider of new moles, moles that have irregular borders, moles that are larger than a pencil eraser, or moles that have changed in shape or color.

## 2018-06-30 NOTE — Progress Notes (Signed)
Subjective:   Kathryn Mathews is a 72 y.o. female who presents for Medicare Annual (Subsequent) preventive examination.  Review of Systems:  N/A Cardiac Risk Factors include: advanced age (>79men, >63 women);dyslipidemia     Objective:     Vitals: BP 118/80 (BP Location: Right Arm, Patient Position: Sitting, Cuff Size: Large)   Pulse (!) 52   Temp 98.2 F (36.8 C) (Oral)   Ht 4' 7.75" (1.416 m) Comment: no shoes  Wt 148 lb 12 oz (67.5 kg)   SpO2 97%   BMI 33.65 kg/m   Body mass index is 33.65 kg/m.  Advanced Directives 06/30/2018 05/20/2017 04/22/2017 04/21/2017 12/24/2016 06/08/2016 04/17/2016  Does Patient Have a Medical Advance Directive? No No No No No No No  Would patient like information on creating a medical advance directive? No - Patient declined - No - Patient declined - - No - Patient declined No - patient declined information  Some encounter information is confidential and restricted. Go to Review Flowsheets activity to see all data.    Tobacco Social History   Tobacco Use  Smoking Status Never Smoker  Smokeless Tobacco Never Used     Counseling given: No   Clinical Intake:  Pre-visit preparation completed: Yes  Pain : No/denies pain Pain Score: 0-No pain     Nutritional Status: BMI 25 -29 Overweight Nutritional Risks: None Diabetes: No  How often do you need to have someone help you when you read instructions, pamphlets, or other written materials from your doctor or pharmacy?: 1 - Never What is the last grade level you completed in school?: 12th grade  Interpreter Needed?: No  Comments: pt lives with spouse Information entered by :: LPinson, LPN  Past Medical History:  Diagnosis Date  . Alopecia    well controlled per previous PCP - improved with biotin  . Anxiety    when around ppl  . Aorto-iliac atherosclerosis (HCC) 05/2016   by xray  . Bipolar disorder (HCC)    no psych  . Chronic sinusitis   . Closed left humeral fracture 2010     closed left humeral neck fracture, also h/o L wrist fx  . Closed right humeral fracture 2011   coracoid nondisplaced, conservative therapy (Dr. Cleon Gustin)  . Depression   . Diverticulosis    by colonoscopy  . Falls    completed HHPT 10/2011, declined outpt PT. completed outpt PT 08/2016.  Marland Kitchen GERD (gastroesophageal reflux disease)   . Glucose intolerance (impaired glucose tolerance)   . High cholesterol   . History of chicken pox   . 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 HLD (hyperlipidemia)   . Humerus fracture   . Hyperactivity of bladder   . Hypothyroid    previously hyperthyroid  . Hypothyroidism   . Osteopenia 2010, 2016   DEXA 2010: T -1.3 L spine, -2.1 femoral neck. DEXA 2016: T -2.4 hip, T -0.5 spine; rpt 2 yrs  . PAD (peripheral artery disease) (HCC)   . Phlebitis    left leg stays swollen  . Positive ANA (antinuclear antibody)   . Pulmonary embolism [facility])    Past Surgical History:  Procedure Laterality Date  . ARTERIAL THROMBECTOMY  07/2009   with R mid popliteal artery angioplasty [balloon] (2011)[[[[  . 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 CESAREAN SECTION    . CESAREAN SECTION  1973  . CHOLECYSTECTOMY    . COLONOSCOPY  08/2008   int/ext hem, severe diverticulosis with evidence of itis, rec rpt 10 yrs  . hospitalization  01/2007   depression, ECT  . INGUINAL HERNIA REPAIR  1980s   x2 on right  . LEFT HEART CATHETERIZATION WITH CORONARY ANGIOGRAM N/A 09/05/2014   Procedure: LEFT HEART CATHETERIZATION WITH CORONARY ANGIOGRAM;  Surgeon: Marykay Lex, MD;  Location: [facility] CATH LAB;  Service: Cardiovascular;  Laterality: N/A;   Family History  Problem Relation Age of Onset  . Cancer Father   . Heart attack Mother 41  . Coronary artery disease Mother 58  . Hyperlipidemia  Mother   . Heart disease Mother   . Hypertension Mother   . Breast cancer Maternal Aunt 54  . Coronary artery disease Maternal Uncle   . Coronary artery disease Maternal Grandmother 70  . Stroke Neg Hx   . Diabetes Neg Hx    Social History   Socioeconomic History  . Marital status: Married    Spouse name: Not on file  . Number of children: Not on file  . Years of education: Not on file  . Highest education level: Not on file  Occupational History    Employer: Retired    Comment: retired  Engineer, production  . Financial resource strain: Not on file  . Food insecurity:    Worry: Not on file    Inability: Not on file  . Transportation needs:    Medical: Not on file    Non-medical: Not on file  Tobacco Use  . Smoking status: Never Smoker  . Smokeless tobacco: Never Used  Substance and Sexual Activity  . Alcohol use: No    Alcohol/week: 0.0 standard drinks  . Drug use: No  . Sexual activity: Never  Lifestyle  . Physical activity:    Days per week: Not on file    Minutes per session: Not on file  . Stress: Not on file  Relationships  . Social connections:    Talks on phone: Not on file    Gets together: Not on file    Attends religious service: Not on file    Active member of club or organization: Not on file    Attends meetings of clubs or organizations: Not on file    Relationship status: Not on file  Other Topics Concern  . Not on file  Social History Narrative   Caffeine: 2 diet coke/day   Born: [place], [place]   Lives with husband, 1 cat.   1 grown son - lives nearby.   [place]   Activity: no regular exercise   Diet: good water, fruits/vegetables daily    Outpatient Encounter Medications as of 06/30/2018  Medication Sig  . acetaminophen (TYLENOL) 325 MG tablet Take 2 tablets (650 mg total) by mouth every 6 (six) hours as needed.  Marland Kitchen acetaminophen (TYLENOL) 500 MG tablet Take 500 mg by mouth every 8 (eight) hours as needed for moderate pain.  .  Artificial Tear Solution (SOOTHE XP OP) Apply 1 drop to eye 2 (two) times daily as needed.  Marland Kitchen atorvastatin (LIPITOR) 20 MG tablet Take 1 tablet (20 mg total) by mouth daily.  . Biotin 1000 MCG tablet Take 1 tablet (1 mg total) by mouth 2 (two) times daily.  Marland Kitchen buPROPion (WELLBUTRIN SR) 200 MG 12 hr tablet Take 1 tablet (200  mg total) by mouth 2 (two) times daily.  . Calcium Carb-Cholecalciferol (CALCIUM-VITAMIN D) 600-400 MG-UNIT TABS Take 1 tablet by mouth daily.  . Cholecalciferol (VITAMIN D) 2000 units CAPS Take 1 capsule (2,000 Units total) by mouth daily.  . clonazePAM (KLONOPIN) 0.5 MG tablet Take 1 tablet (0.5 mg total) by mouth 2 (two) times daily.  . Cyanocobalamin (VITAMIN B-12 PO)   . cyclobenzaprine (FLEXERIL) 5 MG tablet Take 1 tablet (5 mg total) by mouth 2 (two) times daily.  . diclofenac sodium (VOLTAREN) 1 % GEL Apply 1 application topically 2 (two) times daily as needed. (Patient taking differently: Apply 1 application topically 2 (two) times daily as needed (pain). )  . Docusate Calcium (STOOL SOFTENER PO) Take 1 tablet by mouth daily as needed (constipation). Reported on 11/05/2015  . lamoTRIgine (LAMICTAL) 100 MG tablet Take 1 tablet (100 mg total) by mouth 2 (two) times daily.  Marland Kitchen. levothyroxine (SYNTHROID, LEVOTHROID) 50 MCG tablet Take 1 tablet (50 mcg total) daily by mouth.  Marland Kitchen. PRESCRIPTION MEDICATION Place 2 drops into both eyes daily as needed (dryness). Fresh coat  . Propylene Glycol (SYSTANE COMPLETE OP) Apply 2 drops to eye 2 (two) times daily as needed.  . traMADol (ULTRAM) 50 MG tablet Take 1 tablet (50 mg total) by mouth 2 (two) times daily as needed for moderate pain.  Marland Kitchen. warfarin (COUMADIN) 5 MG tablet TAKE DAILY AS DIRECTED BY COUMADIN CLINIC.  Take 1-2 pills daily as directed by coumadin clinic.   No facility-administered encounter medications on file as of 06/30/2018.     Activities of Daily Living In your present state of health, do you have any difficulty  performing the following activities: 06/30/2018  Hearing? Y  Comment turns TV up loud  Vision? N  Difficulty concentrating or making decisions? Y  Walking or climbing stairs? Y  Comment back pain  Dressing or bathing? N  Doing errands, shopping? Y  Preparing Food and eating ? N  Using the Toilet? N  In the past six months, have you accidently leaked urine? N  Do you have problems with loss of bowel control? N  Managing your Medications? Y  Managing your Finances? Y  Housekeeping or managing your Housekeeping? N  Some recent data might be hidden    Patient Care Team: Eustaquio BoydenGutierrez, Javier, MD as PCP - General (Family Medicine) Clapacs, Jackquline DenmarkJohn T, MD as Referring Physician (Psychiatry) Soundra PilonJenna Roney, OD as Referring Physician (Optometry)    Assessment:   This is a routine wellness examination for Kathryn DecemberSharon.   Hearing Screening   125Hz  250Hz  500Hz  1000Hz  2000Hz  3000Hz  4000Hz  6000Hz  8000Hz   Right ear:   40 40 40  40    Left ear:   40 40 40  40    Vision Screening Comments: Vision exam in 2018   Exercise Activities and Dietary recommendations Current Exercise Habits: The patient does not participate in regular exercise at present, Exercise limited by: None identified  Goals    . Patient Stated     Starting 06/30/2018, I will continue to take medications as prescribed.        Fall Risk Fall Risk  06/30/2018 05/20/2017 04/17/2016 04/17/2016 04/12/2015  Falls in the past year? 1 Yes No No No  Number falls in past yr: 1 2 or more - - -  Injury with Fall? 1 No - - -  Risk Factor Category  - High Fall Risk - - -  Risk for fall due to : Impaired balance/gait Impaired balance/gait;Impaired  mobility - - -   Depression Screen PHQ 2/9 Scores 06/30/2018 05/20/2017 04/17/2016 04/17/2016  PHQ - 2 Score 2 6 0 0  PHQ- 9 Score 7 9 - -     Cognitive Function MMSE - Mini Mental State Exam 06/30/2018 05/20/2017 04/17/2016  Orientation to time 5 5 5   Orientation to Place 5 3 5   Orientation to  Place-comments - unable to provide county or state capital -  Registration 3 3 3   Attention/ Calculation 0 0 0  Recall 3 2 3   Recall-comments - unable to recall 1 of 3 words -  Language- name 2 objects 0 0 0  Language- repeat 1 1 1   Language- follow 3 step command 2 2 3   Language- follow 3 step command-comments - unable to follow 1 step of 3 step command -  Language- read & follow direction 0 0 0  Write a sentence 0 0 0  Copy design 0 0 0  Total score 19 16 20      PLEASE NOTE: A Mini-Cog screen was completed. Maximum score is 20. A value of 0 denotes this part of Folstein MMSE was not completed or the patient failed this part of the Mini-Cog screening.   Mini-Cog Screening Orientation to Time - Max 5 pts Orientation to Place - Max 5 pts Registration - Max 3 pts Recall - Max 3 pts Language Repeat - Max 1 pts Language Follow 3 Step Command - Max 3 pts     Immunization History  Administered Date(s) Administered  . Influenza Split 06/23/2011, 05/04/2012  . Influenza,inj,Quad PF,6+ Mos 03/11/2013, 04/10/2014, 04/12/2015, 03/07/2016, 04/29/2017, 03/25/2018  . Pneumococcal Conjugate-13 04/10/2014  . Pneumococcal Polysaccharide-23 02/18/2011  . Td 08/14/2011  . Zoster 08/15/2011    Screening Tests Health Maintenance  Topic Date Due  . DTaP/Tdap/Td (1 - Tdap) 08/13/2021 (Originally 10/23/1956)  . MAMMOGRAM  07/13/2049 (Originally 04/24/2016)  . COLONOSCOPY  08/25/2018  . TETANUS/TDAP  08/13/2021  . INFLUENZA VACCINE  Completed  . DEXA SCAN  Completed  . Hepatitis C Screening  Completed  . PNA vac Low Risk Adult  Completed      Plan:     I have personally reviewed, addressed, and noted the following in the patient's chart:  A. Medical and social history B. Use of alcohol, tobacco or illicit drugs  C. Current medications and supplements D. Functional ability and status E.  Nutritional status F.  Physical activity G. Advance directives H. List of other physicians I.    Hospitalizations, surgeries, and ER visits in previous 12 months J.  Vitals K. Screenings to include hearing, vision, cognitive, depression L. Referrals and appointments - none  In addition, I have reviewed and discussed with patient certain preventive protocols, quality metrics, and best practice recommendations. A written personalized care plan for preventive services as well as general preventive health recommendations were provided to patient.  See attached scanned questionnaire for additional information.   Signed,   Randa Evens, MHA, BS, LPN Health Coach

## 2018-06-30 NOTE — Progress Notes (Signed)
PCP notes:   Health maintenance:  No gaps identified.   Abnormal screenings:   Fall risk - hx of single fall with injury Fall Risk  06/30/2018 05/20/2017 04/17/2016 04/17/2016 04/12/2015  Falls in the past year? 1 Yes No No No  Number falls in past yr: 1 2 or more - - -  Injury with Fall? 1 No - - -  Risk Factor Category  - High Fall Risk - - -  Risk for fall due to : Impaired balance/gait; History of falls Impaired balance/gait;Impaired mobility - - -   Depression score: 7 Depression screen [facility] 06/30/2018 05/20/2017 04/17/2016 04/17/2016 04/12/2015  Decreased Interest 1 3 0 0 3  Down, Depressed, Hopeless 1 3 0 0 3  PHQ - 2 Score 2 6 0 0 6  Altered sleeping 1 0 - - 3  Tired, decreased energy 1 1 - - 3  Change in appetite 1 0 - - 1  Feeling bad or failure about yourself  1 0 - - 0  Trouble concentrating 0 1 - - 0  Moving slowly or fidgety/restless 1 0 - - 0  Suicidal thoughts 0 1 - - 0  PHQ-9 Score 7 9 - - 13  Difficult doing work/chores Somewhat difficult Extremely dIfficult - - Not difficult at all    Mini-cog score: 19/20 MMSE - Mini Mental State Exam 06/30/2018 05/20/2017 04/17/2016  Orientation to time 5 5 5   Orientation to Place 5 3 5   Orientation to Place-comments - unable to provide county or state capital -  Registration 3 3 3   Attention/ Calculation 0 0 0  Recall 3 2 3   Recall-comments - unable to recall 1 of 3 words -  Language- name 2 objects 0 0 0  Language- repeat 1 1 1   Language- follow 3 step command 2 2 3   Language- follow 3 step command-comments - unable to follow 1 step of 3 step command -  Language- read & follow direction 0 0 0  Write a sentence 0 0 0  Copy design 0 0 0  Total score 19 16 20     Patient concerns:   None  Nurse concerns:  None  Next PCP appt:   07/13/18 @ 0830

## 2018-07-01 NOTE — Progress Notes (Signed)
I reviewed health advisor's note, was available for consultation, and agree with documentation and plan.  

## 2018-07-12 ENCOUNTER — Encounter: Payer: Self-pay | Admitting: Family Medicine

## 2018-07-12 NOTE — Assessment & Plan Note (Signed)
Preventative protocols reviewed and updated unless pt declined. Discussed healthy diet and lifestyle.  

## 2018-07-12 NOTE — Progress Notes (Signed)
BP 128/78 (BP Location: Left Arm, Patient Position: Sitting, Cuff Size: Large)   Pulse 80   Temp 97.8 F (36.6 C) (Oral)   Ht 4' 4.75" (1.34 m)   Wt 150 lb 12 oz (68.4 kg)   SpO2 99%   BMI 38.09 kg/m    CC: CPE Subjective:    Patient ID: Kathryn Mathews, female    DOB: March 27, 1946, 72 y.o.   MRN: [id number]  HPI: Kathryn Mathews is a 72 y.o. female presenting on 07/13/2018 for Annual Exam (Pt 2.)   Saw Virl AxeLesia last week for medicare wellness visit. Note reviewed. Remains high fall risk. Recent fall in bathroom with head injury s/p ER eval 05/2018. No further falls. Not using ambulatory assistive device despite recommendations to use.   Pain to R flank with ambulation.   Preventative: COLONOSCOPY Date: 08/2008 int/ext hem, severe diverticulosis with evidence of itis, coming up on repeat due date. Discussed options - interested in cologaurd.  Mammogram -WNL 04/2015. Overdue for f/u.Declines breast exam today, does do breast exams at home.  Pap smear - Always normal in past. No problems with spotting, not sexually active. Has decided to age out. DEXA 2016: T -2.4 hip, T -0.5 spine; consider rpt 2 yrs. Flu shotyearly Tetanus - 07/2011  Pneumovax 02/2011, prevnar 03/2014 zostavax 08/2011  shingrix - discussed.  Advanced directive - states "if I can't think, just let me go". Thinks husband knows, would want husband then son to make decisions. Needs to get this notarized.  Seat belt use discussed. Sunscreen use discussed, no changing moles on skin.  Non smoker  Alcohol - none  Dentist has not recently seen  Eye exam - yearly   Caffeine: 2 diet coke/day Lives with husband, 1 cat. 1 grown son - lives nearby. [place] Activity: no regular exercise Diet: good water, fruits/vegetables daily     Relevant past medical, surgical, family and social history reviewed and updated as indicated. Interim medical history since our last visit reviewed. Allergies and medications  reviewed and updated. Outpatient Medications Prior to Visit  Medication Sig Dispense Refill  . acetaminophen (TYLENOL) 325 MG tablet Take 2 tablets (650 mg total) by mouth every 6 (six) hours as needed. 30 tablet 0  . acetaminophen (TYLENOL) 500 MG tablet Take 500 mg by mouth every 8 (eight) hours as needed for moderate pain.    . Artificial Tear Solution (SOOTHE XP OP) Apply 1 drop to eye 2 (two) times daily as needed.    Marland Kitchen. atorvastatin (LIPITOR) 20 MG tablet Take 1 tablet (20 mg total) by mouth daily. 90 tablet 1  . Biotin 1000 MCG tablet Take 1 tablet (1 mg total) by mouth 2 (two) times daily. 180 tablet 3  . buPROPion (WELLBUTRIN SR) 200 MG 12 hr tablet Take 1 tablet (200 mg total) by mouth 2 (two) times daily. 180 tablet 1  . Calcium Carb-Cholecalciferol (CALCIUM-VITAMIN D) 600-400 MG-UNIT TABS Take 1 tablet by mouth daily. 60 tablet   . clonazePAM (KLONOPIN) 0.5 MG tablet Take 1 tablet (0.5 mg total) by mouth 2 (two) times daily. 180 tablet 1  . Cyanocobalamin (VITAMIN B-12 PO)     . cyclobenzaprine (FLEXERIL) 5 MG tablet Take 1 tablet (5 mg total) by mouth 2 (two) times daily. 60 tablet 0  . diclofenac sodium (VOLTAREN) 1 % GEL Apply 1 application topically 2 (two) times daily as needed. (Patient taking differently: Apply 1 application topically 2 (two) times daily as needed (pain). ) 1  Tube 1  . Docusate Calcium (STOOL SOFTENER PO) Take 1 tablet by mouth daily as needed (constipation). Reported on 11/05/2015    . lamoTRIgine (LAMICTAL) 100 MG tablet Take 1 tablet (100 mg total) by mouth 2 (two) times daily. 180 tablet 1  . levothyroxine (SYNTHROID, LEVOTHROID) 50 MCG tablet Take 1 tablet (50 mcg total) daily by mouth. 90 tablet 3  . PRESCRIPTION MEDICATION Place 2 drops into both eyes daily as needed (dryness). Fresh coat    . Propylene Glycol (SYSTANE COMPLETE OP) Apply 2 drops to eye 2 (two) times daily as needed.    . traMADol (ULTRAM) 50 MG tablet Take 1 tablet (50 mg total) by mouth 2  (two) times daily as needed for moderate pain. 20 tablet 0  . warfarin (COUMADIN) 5 MG tablet TAKE DAILY AS DIRECTED BY COUMADIN CLINIC.  Take 1-2 pills daily as directed by coumadin clinic. 120 tablet 3  . Cholecalciferol (VITAMIN D) 2000 units CAPS Take 1 capsule (2,000 Units total) by mouth daily. 90 capsule 3   No facility-administered medications prior to visit.      Per HPI unless specifically indicated in ROS section below Review of Systems  Constitutional: Negative for activity change, appetite change, chills, fatigue, fever and unexpected weight change.  HENT: Negative for hearing loss.   Eyes: Negative for visual disturbance.  Respiratory: Negative for cough, chest tightness, shortness of breath and wheezing.   Cardiovascular: Positive for leg swelling (left side). Negative for chest pain and palpitations.  Gastrointestinal: Negative for abdominal distention, abdominal pain, blood in stool, constipation, diarrhea, nausea and vomiting.  Genitourinary: Negative for difficulty urinating and hematuria.  Musculoskeletal: Positive for back pain. Negative for arthralgias, myalgias and neck pain.  Skin: Negative for rash.  Neurological: Negative for dizziness, seizures, syncope and headaches.  Hematological: Negative for adenopathy. Does not bruise/bleed easily.  Psychiatric/Behavioral: Negative for dysphoric mood. The patient is not nervous/anxious.    Objective:    BP 128/78 (BP Location: Left Arm, Patient Position: Sitting, Cuff Size: Large)   Pulse 80   Temp 97.8 F (36.6 C) (Oral)   Ht 4' 4.75" (1.34 m)   Wt 150 lb 12 oz (68.4 kg)   SpO2 99%   BMI 38.09 kg/m   Wt Readings from Last 3 Encounters:  07/13/18 150 lb 12 oz (68.4 kg)  06/30/18 148 lb 12 oz (67.5 kg)  06/17/18 150 lb 12 oz (68.4 kg)    Physical Exam Vitals signs and nursing note reviewed.  Constitutional:      General: She is not in acute distress.    Appearance: She is well-developed.  HENT:     Head:  Normocephalic and atraumatic.     Right Ear: Hearing, tympanic membrane, ear canal and external ear normal.     Left Ear: Hearing, tympanic membrane, ear canal and external ear normal.     Nose: Nose normal.     Mouth/Throat:     Pharynx: Uvula midline. No oropharyngeal exudate or posterior oropharyngeal erythema.  Eyes:     General: No scleral icterus.    Conjunctiva/sclera: Conjunctivae normal.     Pupils: Pupils are equal, round, and reactive to light.  Neck:     Musculoskeletal: Normal range of motion and neck supple.     Vascular: No carotid bruit.  Cardiovascular:     Rate and Rhythm: Normal rate and regular rhythm.     Pulses:          Radial pulses are 2+ on  the right side and 2+ on the left side.     Heart sounds: Normal heart sounds. No murmur.  Pulmonary:     Effort: Pulmonary effort is normal. No respiratory distress.     Breath sounds: Normal breath sounds. No wheezing or rales.  Abdominal:     General: Bowel sounds are normal. There is no distension.     Palpations: Abdomen is soft. There is no mass.     Tenderness: There is no abdominal tenderness. There is no guarding or rebound.  Musculoskeletal: Normal range of motion.  Lymphadenopathy:     Cervical: No cervical adenopathy.  Skin:    General: Skin is warm and dry.     Findings: No rash.  Neurological:     Mental Status: She is alert and oriented to person, place, and time.     Comments: CN grossly intact, station and gait intact  Psychiatric:        Behavior: Behavior normal.        Thought Content: Thought content normal.        Judgment: Judgment normal.       Results for orders placed or performed in visit on 06/30/18  CBC with Differential/Platelet  Result Value Ref Range   WBC 5.8 4.0 - 10.5 K/uL   RBC 4.44 3.87 - 5.11 Mil/uL   Hemoglobin 14.3 12.0 - 15.0 g/dL   HCT 16.143.3 09.636.0 - 04.546.0 %   MCV 97.6 78.0 - 100.0 fl   MCHC 33.1 30.0 - 36.0 g/dL   RDW 40.914.5 81.111.5 - 91.415.5 %   Platelets 249.0 150.0 -  400.0 K/uL   Neutrophils Relative % 58.6 43.0 - 77.0 %   Lymphocytes Relative 28.1 12.0 - 46.0 %   Monocytes Relative 8.2 3.0 - 12.0 %   Eosinophils Relative 4.7 0.0 - 5.0 %   Basophils Relative 0.4 0.0 - 3.0 %   Neutro Abs 3.4 1.4 - 7.7 K/uL   Lymphs Abs 1.6 0.7 - 4.0 K/uL   Monocytes Absolute 0.5 0.1 - 1.0 K/uL   Eosinophils Absolute 0.3 0.0 - 0.7 K/uL   Basophils Absolute 0.0 0.0 - 0.1 K/uL  TSH  Result Value Ref Range   TSH 1.62 0.35 - 4.50 uIU/mL  Comprehensive metabolic panel  Result Value Ref Range   Sodium 139 135 - 145 mEq/L   Potassium 4.7 3.5 - 5.1 mEq/L   Chloride 102 96 - 112 mEq/L   CO2 29 19 - 32 mEq/L   Glucose, Bld 105 (H) 70 - 99 mg/dL   BUN 18 6 - 23 mg/dL   Creatinine, Ser 7.820.89 0.40 - 1.20 mg/dL   Total Bilirubin 0.5 0.2 - 1.2 mg/dL   Alkaline Phosphatase 65 39 - 117 U/L   AST 18 0 - 37 U/L   ALT 12 0 - 35 U/L   Total Protein 7.2 6.0 - 8.3 g/dL   Albumin 4.2 3.5 - 5.2 g/dL   Calcium 95.610.0 8.4 - 21.310.5 mg/dL   GFR 08.6566.14 >78.46>60.00 mL/min  Lipid panel  Result Value Ref Range   Cholesterol 176 0 - 200 mg/dL   Triglycerides 962.9175.0 (H) 0.0 - 149.0 mg/dL   HDL 52.8453.90 >13.24>39.00 mg/dL   VLDL 40.135.0 0.0 - 02.740.0 mg/dL   LDL Cholesterol 87 0 - 99 mg/dL   Total CHOL/HDL Ratio 3    NonHDL 122.04   VITAMIN D 25 Hydroxy (Vit-D Deficiency, Fractures)  Result Value Ref Range   VITD 33.91 30.00 - 100.00 ng/mL  Microalbumin /  creatinine urine ratio  Result Value Ref Range   Microalb, Ur 0.8 0.0 - 1.9 mg/dL   Creatinine,U 16.1 mg/dL   Microalb Creat Ratio 1.6 0.0 - 30.0 mg/g   Lab Results  Component Value Date   INR 2.5 06/24/2018   INR 5.6 (A) 06/17/2018   INR 2.15 06/05/2018    Assessment & Plan:   Problem List Items Addressed This Visit    Vitamin D deficiency    rec restart 1000 IU daily vit D      Recurrent falls   PAD (peripheral artery disease) (HCC)    Chronic, continue statin. On coumadin.       Osteopenia    Reviewed calcium and vit D dosing, encouraged  calcium from diet. Encouraged weight bearing exercises. Update DEXA.       Relevant Orders   DG Bone Density   Memory deficit    Stable period.       Imbalance   Hypothyroid    Chronic, stable on levothyroxine.       HLD (hyperlipidemia)    Chronic, stable on atorvastatin 20mg  daily. The 10-year ASCVD risk score Denman George DC Montez Hageman., et al., 2013) is: 11.4%   Values used to calculate the score:     Age: 73 years     Sex: Female     Is Non-Hispanic African American: No     Diabetic: No     Tobacco smoker: No     Systolic Blood Pressure: 128 mmHg     Is BP treated: No     HDL Cholesterol: 53.9 mg/dL     Total Cholesterol: 176 mg/dL       Health maintenance examination - Primary    Preventative protocols reviewed and updated unless pt declined. Discussed healthy diet and lifestyle.       Gait disturbance    Encouraged regular use of ambulatory assistive device - she has rollator at home.       Encounter for therapeutic drug monitoring    Will see if she can get coumadin checked today.       Bipolar disorder [facility])    Appreciate psych care.       Aorto-iliac atherosclerosis (HCC)    Continue statin.      Advanced care planning/counseling discussion    Advanced directive - states "if I can't think, just let me go". Thinks husband knows, would want husband then son to make decisions. Needs to get this notarized.        Other Visit Diagnoses    Breast cancer screening       Relevant Orders   MM Digital Screening       Meds ordered this encounter  Medications  . Cholecalciferol (VITAMIN D3) 25 MCG (1000 UT) CAPS    Sig: Take 1 capsule (1,000 Units total) by mouth daily.    Dispense:  30 capsule   Orders Placed This Encounter  Procedures  . MM Digital Screening    Standing Status:   Future    Standing Expiration Date:   09/11/2019    Order Specific Question:   Reason for Exam (SYMPTOM  OR DIAGNOSIS REQUIRED)    Answer:   breast cancer screening    Order  Specific Question:   Preferred imaging location?    Answer:   [facility]  . DG Bone Density    Standing Status:   Future    Standing Expiration Date:   09/11/2019    Order Specific Question:   Reason  for Exam (SYMPTOM  OR DIAGNOSIS REQUIRED)    Answer:   f/u osteopenia    Order Specific Question:   Preferred imaging location?    Answer:   [facility]    Follow up plan: Return in about 6 months (around 01/11/2019) for follow up visit.  Eustaquio Boyden, MD

## 2018-07-13 ENCOUNTER — Ambulatory Visit (INDEPENDENT_AMBULATORY_CARE_PROVIDER_SITE_OTHER): Payer: Medicare HMO | Admitting: Family Medicine

## 2018-07-13 ENCOUNTER — Ambulatory Visit (INDEPENDENT_AMBULATORY_CARE_PROVIDER_SITE_OTHER): Payer: Medicare HMO

## 2018-07-13 ENCOUNTER — Encounter: Payer: Self-pay | Admitting: Family Medicine

## 2018-07-13 VITALS — BP 128/78 | HR 80 | Temp 97.8°F | Ht <= 58 in | Wt 150.8 lb

## 2018-07-13 DIAGNOSIS — E039 Hypothyroidism, unspecified: Secondary | ICD-10-CM

## 2018-07-13 DIAGNOSIS — Z5181 Encounter for therapeutic drug level monitoring: Secondary | ICD-10-CM

## 2018-07-13 DIAGNOSIS — Z0000 Encounter for general adult medical examination without abnormal findings: Secondary | ICD-10-CM

## 2018-07-13 DIAGNOSIS — R413 Other amnesia: Secondary | ICD-10-CM

## 2018-07-13 DIAGNOSIS — E559 Vitamin D deficiency, unspecified: Secondary | ICD-10-CM | POA: Diagnosis not present

## 2018-07-13 DIAGNOSIS — F3177 Bipolar disorder, in partial remission, most recent episode mixed: Secondary | ICD-10-CM

## 2018-07-13 DIAGNOSIS — R2689 Other abnormalities of gait and mobility: Secondary | ICD-10-CM

## 2018-07-13 DIAGNOSIS — Z7901 Long term (current) use of anticoagulants: Secondary | ICD-10-CM

## 2018-07-13 DIAGNOSIS — Z1239 Encounter for other screening for malignant neoplasm of breast: Secondary | ICD-10-CM | POA: Diagnosis not present

## 2018-07-13 DIAGNOSIS — R296 Repeated falls: Secondary | ICD-10-CM

## 2018-07-13 DIAGNOSIS — I739 Peripheral vascular disease, unspecified: Secondary | ICD-10-CM | POA: Diagnosis not present

## 2018-07-13 DIAGNOSIS — Z7189 Other specified counseling: Secondary | ICD-10-CM | POA: Diagnosis not present

## 2018-07-13 DIAGNOSIS — E785 Hyperlipidemia, unspecified: Secondary | ICD-10-CM | POA: Diagnosis not present

## 2018-07-13 DIAGNOSIS — M8580 Other specified disorders of bone density and structure, unspecified site: Secondary | ICD-10-CM | POA: Diagnosis not present

## 2018-07-13 DIAGNOSIS — R269 Unspecified abnormalities of gait and mobility: Secondary | ICD-10-CM

## 2018-07-13 DIAGNOSIS — I708 Atherosclerosis of other arteries: Secondary | ICD-10-CM

## 2018-07-13 DIAGNOSIS — I700 Atherosclerosis of aorta: Secondary | ICD-10-CM

## 2018-07-13 LAB — POCT INR: INR: 3 (ref 2.0–3.0)

## 2018-07-13 MED ORDER — VITAMIN D3 25 MCG (1000 UT) PO CAPS: 1.0000 | ORAL_CAPSULE | Freq: Every day | ORAL | Status: AC

## 2018-07-13 NOTE — Assessment & Plan Note (Signed)
Appreciate psych care.  ?

## 2018-07-13 NOTE — Patient Instructions (Signed)
INR: 3.0  Continue to take 1 pill daily EXCEPT for 1.5 pills (7.5mg ) on Monday/Wednesday/ Fridays.  Recheck in 2 to 3 weeks.  Patient is doing well without any changes to diet, health or medications.  She also denies any abnormal bruising or bleeding.

## 2018-07-13 NOTE — Assessment & Plan Note (Signed)
Chronic, stable on levothyroxine.  

## 2018-07-13 NOTE — Assessment & Plan Note (Signed)
Chronic, continue statin. On coumadin.

## 2018-07-13 NOTE — Patient Instructions (Addendum)
See Leafy Ro to see if we can do coumadin check today.  We will sign you up for cologuard - company [employer]) will mail you a Equities trader.  We will sign you up for mammogram and bone density scan this coming year.  If interested, check with pharmacy about new 2 shot shingles series (shingrix).  Bring me copy of your advanced directive when notarized.  Return as needed or in 6 months for follow up visit.   Health Maintenance After Age 72 After age 11, you are at a higher risk for certain long-term diseases and infections as well as injuries from falls. Falls are a major cause of broken bones and head injuries in people who are older than age 73. Getting regular preventive care can help to keep you healthy and well. Preventive care includes getting regular testing and making lifestyle changes as recommended by your health care provider. Talk with your health care provider about:  Which screenings and tests you should have. A screening is a test that checks for a disease when you have no symptoms.  A diet and exercise plan that is right for you. What should I know about screenings and tests to prevent falls? Screening and testing are the best ways to find a health problem early. Early diagnosis and treatment give you the best chance of managing medical conditions that are common after age 62. Certain conditions and lifestyle choices may make you more likely to have a fall. Your health care provider may recommend:  Regular vision checks. Poor vision and conditions such as cataracts can make you more likely to have a fall. If you wear glasses, make sure to get your prescription updated if your vision changes.  Medicine review. Work with your health care provider to regularly review all of the medicines you are taking, including over-the-counter medicines. Ask your health care provider about any side effects that may make you more likely to have a fall. Tell your health care provider if any  medicines that you take make you feel dizzy or sleepy.  Osteoporosis screening. Osteoporosis is a condition that causes the bones to get weaker. This can make the bones weak and cause them to break more easily.  Blood pressure screening. Blood pressure changes and medicines to control blood pressure can make you feel dizzy.  Strength and balance checks. Your health care provider may recommend certain tests to check your strength and balance while standing, walking, or changing positions.  Foot health exam. Foot pain and numbness, as well as not wearing proper footwear, can make you more likely to have a fall.  Depression screening. You may be more likely to have a fall if you have a fear of falling, feel emotionally low, or feel unable to do activities that you used to do.  Alcohol use screening. Using too much alcohol can affect your balance and may make you more likely to have a fall. What actions can I take to lower my risk of falls? General instructions  Talk with your health care provider about your risks for falling. Tell your health care provider if: ? You fall. Be sure to tell your health care provider about all falls, even ones that seem minor. ? You feel dizzy, sleepy, or off-balance.  Take over-the-counter and prescription medicines only as told by your health care provider. These include any supplements.  Eat a healthy diet and maintain a healthy weight. A healthy diet includes low-fat dairy products, low-fat (lean) meats, and fiber from  whole grains, beans, and lots of fruits and vegetables. Home safety  Remove any tripping hazards, such as rugs, cords, and clutter.  Install safety equipment such as grab bars in bathrooms and safety rails on stairs.  Keep rooms and walkways well-lit. Activity   Follow a regular exercise program to stay fit. This will help you maintain your balance. Ask your health care provider what types of exercise are appropriate for you.  If you  need a cane or walker, use it as recommended by your health care provider.  Wear supportive shoes that have nonskid soles. Lifestyle  Do not drink alcohol if your health care provider tells you not to drink.  If you drink alcohol, limit how much you have: ? 0-1 drink a day for women. ? 0-2 drinks a day for men.  Be aware of how much alcohol is in your drink. In the [place] one drink equals one typical bottle of beer (12 oz), one-half glass of wine (5 oz), or one shot of hard liquor (1 oz).  Do not use any products that contain nicotine or tobacco, such as cigarettes and e-cigarettes. If you need help quitting, ask your health care provider. Summary  Having a healthy lifestyle and getting preventive care can help to protect your health and wellness after age 12.  Screening and testing are the best way to find a health problem early and help you avoid having a fall. Early diagnosis and treatment give you the best chance for managing medical conditions that are more common for people who are older than age 58.  Falls are a major cause of broken bones and head injuries in people who are older than age 4. Take precautions to prevent a fall at home.  Work with your health care provider to learn what changes you can make to improve your health and wellness and to prevent falls. This information is not intended to replace advice given to you by your health care provider. Make sure you discuss any questions you have with your health care provider. Document Released: 05/13/2017 Document Revised: 05/13/2017 Document Reviewed: 05/13/2017 Elsevier Interactive Patient Education  2019 [employer].

## 2018-07-13 NOTE — Assessment & Plan Note (Signed)
Encouraged regular use of ambulatory assistive device - she has rollator at home.

## 2018-07-13 NOTE — Assessment & Plan Note (Signed)
Advanced directive - states "if I can't think, just let me go". Thinks husband knows, would want husband then son to make decisions. Needs to get this notarized.

## 2018-07-13 NOTE — Assessment & Plan Note (Signed)
Reviewed calcium and vit D dosing, encouraged calcium from diet. Encouraged weight bearing exercises. Update DEXA.

## 2018-07-13 NOTE — Assessment & Plan Note (Signed)
Stable period.  

## 2018-07-13 NOTE — Assessment & Plan Note (Signed)
Continue statin. 

## 2018-07-13 NOTE — Assessment & Plan Note (Signed)
rec restart 1000 IU daily vit D

## 2018-07-13 NOTE — Assessment & Plan Note (Signed)
Will see if she can get coumadin checked today.

## 2018-07-13 NOTE — Assessment & Plan Note (Signed)
Chronic, stable on atorvastatin 20mg  daily. The 10-year ASCVD risk score Denman George(Goff DC Montez HagemanJr., et al., 2013) is: 11.4%   Values used to calculate the score:     Age: 72 years     Sex: Female     Is Non-Hispanic African American: No     Diabetic: No     Tobacco smoker: No     Systolic Blood Pressure: 128 mmHg     Is BP treated: No     HDL Cholesterol: 53.9 mg/dL     Total Cholesterol: 176 mg/dL

## 2018-07-15 ENCOUNTER — Ambulatory Visit: Payer: Medicare HMO

## 2018-07-22 ENCOUNTER — Other Ambulatory Visit: Payer: Self-pay

## 2018-07-22 MED ORDER — ATORVASTATIN CALCIUM 20 MG PO TABS: 20.0000 mg | ORAL_TABLET | Freq: Every day | ORAL | 3 refills | Status: AC

## 2018-07-22 NOTE — Telephone Encounter (Signed)
Escribed

## 2018-07-23 ENCOUNTER — Other Ambulatory Visit: Payer: Self-pay

## 2018-07-23 MED ORDER — LEVOTHYROXINE SODIUM 50 MCG PO TABS: 50.0000 ug | ORAL_TABLET | Freq: Every day | ORAL | 3 refills | Status: AC

## 2018-07-23 NOTE — Telephone Encounter (Signed)
Aram Beecham with [facility] request refill levothyroxine 50 mcg and pt seen annual 07/13/18; refill given to [facility] pharmacist at [facility] per protocol.

## 2018-07-26 ENCOUNTER — Encounter: Payer: Self-pay | Admitting: Family Medicine

## 2018-07-26 LAB — COLOGUARD

## 2018-07-29 ENCOUNTER — Ambulatory Visit (INDEPENDENT_AMBULATORY_CARE_PROVIDER_SITE_OTHER): Payer: Medicare HMO | Admitting: General Practice

## 2018-07-29 DIAGNOSIS — I739 Peripheral vascular disease, unspecified: Secondary | ICD-10-CM

## 2018-07-29 DIAGNOSIS — Z7901 Long term (current) use of anticoagulants: Secondary | ICD-10-CM

## 2018-07-29 LAB — POCT INR: INR: 4.2 — AB (ref 2.0–3.0)

## 2018-07-29 NOTE — Patient Instructions (Signed)
Pre visit review using our clinic review tool, if applicable. No additional management support is needed unless otherwise documented below in the visit note.  Do not take coumadin today or tomorrow (1/16 and 1/17) and then change dosage and take 1 tablet daily except 1 1/2 tablets on Monday and Fridays.  Re-check in 2 weeks.  Patient is using CBD oil and that does interact with Coumadin.

## 2018-08-04 ENCOUNTER — Ambulatory Visit: Payer: Medicare HMO

## 2018-08-09 DIAGNOSIS — Z1211 Encounter for screening for malignant neoplasm of colon: Secondary | ICD-10-CM | POA: Diagnosis not present

## 2018-08-09 DIAGNOSIS — Z1212 Encounter for screening for malignant neoplasm of rectum: Secondary | ICD-10-CM | POA: Diagnosis not present

## 2018-08-12 ENCOUNTER — Ambulatory Visit (INDEPENDENT_AMBULATORY_CARE_PROVIDER_SITE_OTHER): Payer: Medicare HMO | Admitting: General Practice

## 2018-08-12 DIAGNOSIS — I739 Peripheral vascular disease, unspecified: Secondary | ICD-10-CM

## 2018-08-12 DIAGNOSIS — Z7901 Long term (current) use of anticoagulants: Secondary | ICD-10-CM

## 2018-08-12 LAB — POCT INR: INR: 3.6 — AB (ref 2.0–3.0)

## 2018-08-12 NOTE — Patient Instructions (Addendum)
Pre visit review using our clinic review tool, if applicable. No additional management support is needed unless otherwise documented below in the visit note.  Skip coumadin today (1/30)  and then change dosage and take 1 tablet daily except 1 1/2 tablets on Mondays only. Re-check in 3 weeks.  Patient is using CBD oil and that does interact with Coumadin.

## 2018-08-13 LAB — COLOGUARD: Cologuard: NEGATIVE

## 2018-08-16 ENCOUNTER — Encounter: Payer: Self-pay | Admitting: Family Medicine

## 2018-08-16 ENCOUNTER — Telehealth: Payer: Self-pay

## 2018-08-16 NOTE — Telephone Encounter (Signed)
Left message on vm per dpr notifying pt her Cologurad results were negative.

## 2018-09-02 ENCOUNTER — Ambulatory Visit (INDEPENDENT_AMBULATORY_CARE_PROVIDER_SITE_OTHER): Payer: Medicare HMO | Admitting: General Practice

## 2018-09-02 DIAGNOSIS — Z7901 Long term (current) use of anticoagulants: Secondary | ICD-10-CM

## 2018-09-02 DIAGNOSIS — I739 Peripheral vascular disease, unspecified: Secondary | ICD-10-CM

## 2018-09-02 LAB — POCT INR: INR: 2.9 (ref 2.0–3.0)

## 2018-09-02 NOTE — Patient Instructions (Addendum)
Pre visit review using our clinic review tool, if applicable. No additional management support is needed unless otherwise documented below in the visit note.  Continue to take 1 tablet daily except 1 1/2 tablets on Mondays only. Re-check in 4 weeks.  Patient is using CBD oil and that does interact with Coumadin.

## 2018-09-06 DIAGNOSIS — Z0100 Encounter for examination of eyes and vision without abnormal findings: Secondary | ICD-10-CM | POA: Diagnosis not present

## 2018-09-30 ENCOUNTER — Ambulatory Visit: Payer: Medicare HMO

## 2018-09-30 ENCOUNTER — Other Ambulatory Visit: Payer: Self-pay

## 2018-09-30 ENCOUNTER — Ambulatory Visit (INDEPENDENT_AMBULATORY_CARE_PROVIDER_SITE_OTHER): Payer: Medicare HMO | Admitting: General Practice

## 2018-09-30 DIAGNOSIS — Z7901 Long term (current) use of anticoagulants: Secondary | ICD-10-CM

## 2018-09-30 DIAGNOSIS — I739 Peripheral vascular disease, unspecified: Secondary | ICD-10-CM

## 2018-09-30 LAB — POCT INR: INR: 1.9 — AB (ref 2.0–3.0)

## 2018-09-30 NOTE — Patient Instructions (Addendum)
Pre visit review using our clinic review tool, if applicable. No additional management support is needed unless otherwise documented below in the visit note.  Take 1 1/2 tablets today (3/19) continue to take 1 tablet daily except 1 1/2 tablets on Mondays only. Re-check in 4 weeks.  Patient is using CBD oil and that does interact with Coumadin.

## 2018-10-28 ENCOUNTER — Other Ambulatory Visit (INDEPENDENT_AMBULATORY_CARE_PROVIDER_SITE_OTHER): Payer: Medicare HMO

## 2018-10-28 ENCOUNTER — Other Ambulatory Visit: Payer: Medicare HMO

## 2018-10-28 ENCOUNTER — Ambulatory Visit (INDEPENDENT_AMBULATORY_CARE_PROVIDER_SITE_OTHER): Payer: Medicare HMO | Admitting: General Practice

## 2018-10-28 ENCOUNTER — Ambulatory Visit: Payer: Medicare HMO | Admitting: Psychiatry

## 2018-10-28 DIAGNOSIS — I739 Peripheral vascular disease, unspecified: Secondary | ICD-10-CM

## 2018-10-28 DIAGNOSIS — Z7901 Long term (current) use of anticoagulants: Secondary | ICD-10-CM

## 2018-10-28 DIAGNOSIS — Z86718 Personal history of other venous thrombosis and embolism: Secondary | ICD-10-CM

## 2018-10-28 LAB — POCT INR: INR: 1.8 — AB (ref 2.0–3.0)

## 2018-10-28 NOTE — Patient Instructions (Signed)
Pre visit review using our clinic review tool, if applicable. No additional management support is needed unless otherwise documented below in the visit note.  Take 1 1/2 tablets today (4/16) and then change dosage and take 1 tablet daily except 1 1/2 tablets on Mondays and Fridays.  Re-check in 4 weeks.  Patient is using CBD oil and that does interact with Coumadin.

## 2018-10-29 ENCOUNTER — Other Ambulatory Visit: Payer: Self-pay | Admitting: Psychiatry

## 2018-10-29 DIAGNOSIS — F411 Generalized anxiety disorder: Secondary | ICD-10-CM

## 2018-11-02 NOTE — Telephone Encounter (Signed)
pt needs refills on  klonopin  [facility] and the lamotrigine and wellbutrin sr needs refilled also but that needs to go to [facility].

## 2018-11-03 ENCOUNTER — Other Ambulatory Visit: Payer: Self-pay | Admitting: Psychiatry

## 2018-11-03 DIAGNOSIS — F411 Generalized anxiety disorder: Secondary | ICD-10-CM

## 2018-11-03 MED ORDER — BUPROPION HCL ER (SR) 200 MG PO TB12: 200.0000 mg | ORAL_TABLET | Freq: Two times a day (BID) | ORAL | 1 refills | Status: DC

## 2018-11-03 MED ORDER — CLONAZEPAM 0.5 MG PO TABS: 0.5000 mg | ORAL_TABLET | Freq: Two times a day (BID) | ORAL | 1 refills | Status: DC

## 2018-11-03 MED ORDER — LAMOTRIGINE 100 MG PO TABS: 100.0000 mg | ORAL_TABLET | Freq: Two times a day (BID) | ORAL | 1 refills | Status: DC

## 2018-11-23 ENCOUNTER — Telehealth: Payer: Self-pay

## 2018-11-23 NOTE — Telephone Encounter (Signed)
[facility] Night - Client Nonclinical Telephone Record [facility] Client [facility] Night - Client Client Site [place] [facility] [facility] [place] - Night Physician Eustaquio Boyden - MD Contact Type Call Who Is Calling Patient / Member / Family / Caregiver Caller Name Kathryn Mathews Caller Phone Number [phone] Patient Name Kathryn Mathews Patient DOB 1945/11/05 Call Type Message Only Information Provided Reason for Call Request for General Office Information Initial Comment Caller reports she is returning a call to the office about her appointment, is wondering if she has to wait outside this time. Additional Comment Provided Office Hours. Call Closed

## 2018-11-23 NOTE — Telephone Encounter (Signed)
Spoke with pt reviewing curbside directions for her lab visit for INR on 11/25/18.  Pt verbalizes understanding and expresses her thanks for the call back.

## 2018-11-25 ENCOUNTER — Ambulatory Visit (INDEPENDENT_AMBULATORY_CARE_PROVIDER_SITE_OTHER): Payer: Medicare HMO | Admitting: General Practice

## 2018-11-25 ENCOUNTER — Other Ambulatory Visit (INDEPENDENT_AMBULATORY_CARE_PROVIDER_SITE_OTHER): Payer: Medicare HMO

## 2018-11-25 DIAGNOSIS — Z86718 Personal history of other venous thrombosis and embolism: Secondary | ICD-10-CM

## 2018-11-25 DIAGNOSIS — Z7901 Long term (current) use of anticoagulants: Secondary | ICD-10-CM | POA: Diagnosis not present

## 2018-11-25 DIAGNOSIS — I739 Peripheral vascular disease, unspecified: Secondary | ICD-10-CM

## 2018-11-25 LAB — POCT INR: INR: 2.5 (ref 2.0–3.0)

## 2018-11-25 NOTE — Patient Instructions (Signed)
Pre visit review using our clinic review tool, if applicable. No additional management support is needed unless otherwise documented below in the visit note.  Continue to take 1 tablet daily except  1 1/2 tablets on Mondays and Fridays.  Re-check in 4 weeks.  

## 2018-12-09 ENCOUNTER — Ambulatory Visit (INDEPENDENT_AMBULATORY_CARE_PROVIDER_SITE_OTHER): Payer: Medicare HMO | Admitting: Psychiatry

## 2018-12-09 ENCOUNTER — Other Ambulatory Visit: Payer: Self-pay

## 2018-12-09 DIAGNOSIS — F411 Generalized anxiety disorder: Secondary | ICD-10-CM | POA: Diagnosis not present

## 2018-12-09 MED ORDER — BUPROPION HCL ER (SR) 200 MG PO TB12: 200.0000 mg | ORAL_TABLET | Freq: Two times a day (BID) | ORAL | 1 refills | Status: AC

## 2018-12-09 MED ORDER — LAMOTRIGINE 100 MG PO TABS: 100.0000 mg | ORAL_TABLET | Freq: Two times a day (BID) | ORAL | 1 refills | Status: AC

## 2018-12-09 MED ORDER — CLONAZEPAM 0.5 MG PO TABS: 0.5000 mg | ORAL_TABLET | Freq: Two times a day (BID) | ORAL | 1 refills | Status: AC

## 2018-12-09 MED ORDER — ARIPIPRAZOLE 5 MG PO TABS: 5.0000 mg | ORAL_TABLET | Freq: Every day | ORAL | 5 refills | Status: AC

## 2018-12-09 NOTE — Progress Notes (Signed)
Initial attempt at phone contact at 1:06 PM on the 28th resulted in a voicemail answering.  I left a message including the number I am currently.  Will reattempt.  Found the patient.  She had come to the office for an inpatient appointment.  Patient continues to complain bitterly of how everything in her life is terrible.  She blames it all on her husband.  She denies suicidal thoughts but insists that she enjoys nothing and that everything is awful.  Does not appear to be psychotic.  She appears to get along fine with her husband when I see her but she insists that he is cranky all the time.  Neatly dressed and groomed.  Minimal eye contact.  Psychomotor activity decreased.  Denies suicidal ideation.  No homicidal ideation.  No psychosis.  Expressed a lot of regret and sorrow for the patient at the way her life is.  It is very difficult to get any kind of response from her about the possibility that things could get better.  I reviewed her old chart and saw that she actually seemed to get better when she took Abilify but stopped it because of price.  We got out a good Rx card and looked up prices at local stores and found that the medicine could be obtained for as little as $15 with good Rx.  I explained all of this to the patient and gave her a prescription and a good Rx card.  Hope this works out.  Continue other medicines see her back in 6 months.

## 2018-12-16 ENCOUNTER — Other Ambulatory Visit: Payer: Self-pay

## 2018-12-16 ENCOUNTER — Emergency Department (HOSPITAL_COMMUNITY)
Admission: EM | Admit: 2018-12-16 | Discharge: 2018-12-16 | Disposition: A | Discharge status: Home / Self Care | Payer: Medicare HMO | Attending: Emergency Medicine | Admitting: Emergency Medicine

## 2018-12-16 ENCOUNTER — Emergency Department (HOSPITAL_COMMUNITY): Payer: Medicare HMO

## 2018-12-16 ENCOUNTER — Encounter (HOSPITAL_COMMUNITY): Payer: Self-pay | Admitting: Emergency Medicine

## 2018-12-16 DIAGNOSIS — W19XXXA Unspecified fall, initial encounter: Secondary | ICD-10-CM | POA: Diagnosis not present

## 2018-12-16 DIAGNOSIS — M542 Cervicalgia: Secondary | ICD-10-CM | POA: Diagnosis not present

## 2018-12-16 DIAGNOSIS — Z8673 Personal history of transient ischemic attack (TIA), and cerebral infarction without residual deficits: Secondary | ICD-10-CM | POA: Insufficient documentation

## 2018-12-16 DIAGNOSIS — Z79899 Other long term (current) drug therapy: Secondary | ICD-10-CM | POA: Diagnosis not present

## 2018-12-16 DIAGNOSIS — R51 Headache: Secondary | ICD-10-CM | POA: Insufficient documentation

## 2018-12-16 DIAGNOSIS — R0902 Hypoxemia: Secondary | ICD-10-CM | POA: Diagnosis not present

## 2018-12-16 DIAGNOSIS — E039 Hypothyroidism, unspecified: Secondary | ICD-10-CM | POA: Insufficient documentation

## 2018-12-16 DIAGNOSIS — Z7901 Long term (current) use of anticoagulants: Secondary | ICD-10-CM | POA: Insufficient documentation

## 2018-12-16 DIAGNOSIS — S12090A Other displaced fracture of first cervical vertebra, initial encounter for closed fracture: Secondary | ICD-10-CM | POA: Diagnosis not present

## 2018-12-16 DIAGNOSIS — Z20828 Contact with and (suspected) exposure to other viral communicable diseases: Secondary | ICD-10-CM | POA: Insufficient documentation

## 2018-12-16 DIAGNOSIS — Z03818 Encounter for observation for suspected exposure to other biological agents ruled out: Secondary | ICD-10-CM | POA: Diagnosis not present

## 2018-12-16 DIAGNOSIS — R52 Pain, unspecified: Secondary | ICD-10-CM | POA: Diagnosis not present

## 2018-12-16 DIAGNOSIS — S0990XA Unspecified injury of head, initial encounter: Secondary | ICD-10-CM | POA: Diagnosis not present

## 2018-12-16 LAB — BASIC METABOLIC PANEL
Anion gap: 8 (ref 5–15)
BUN: 14 mg/dL (ref 8–23)
CO2: 24 mmol/L (ref 22–32)
Calcium: 9.8 mg/dL (ref 8.9–10.3)
Chloride: 106 mmol/L (ref 98–111)
Creatinine, Ser: 0.91 mg/dL (ref 0.44–1.00)
GFR calc Af Amer: >60 mL/min (ref >60)
GFR calc non Af Amer: >60 mL/min (ref >60)
Glucose, Bld: 105 mg/dL — ABNORMAL HIGH (ref 70–99)
Potassium: 4.7 mmol/L (ref 3.5–5.1)
Sodium: 138 mmol/L (ref 135–145)

## 2018-12-16 LAB — CBC WITH DIFFERENTIAL/PLATELET
Abs Immature Granulocytes: 0.04 10*3/uL (ref 0.00–0.07)
Basophils Absolute: 0 10*3/uL (ref 0.0–0.1)
Basophils Relative: 0 %
Eosinophils Absolute: 0.2 10*3/uL (ref 0.0–0.5)
Eosinophils Relative: 2 %
HCT: 44.8 % (ref 36.0–46.0)
Hemoglobin: 14.5 g/dL (ref 12.0–15.0)
Immature Granulocytes: 0 %
Lymphocytes Relative: 13 %
Lymphs Abs: 1.2 10*3/uL (ref 0.7–4.0)
MCH: 31.7 pg (ref 26.0–34.0)
MCHC: 32.4 g/dL (ref 30.0–36.0)
MCV: 98 fL (ref 80.0–100.0)
Monocytes Absolute: 0.5 10*3/uL (ref 0.1–1.0)
Monocytes Relative: 5 %
Neutro Abs: 7.6 10*3/uL (ref 1.7–7.7)
Neutrophils Relative %: 80 %
Platelets: 197 10*3/uL (ref 150–400)
RBC: 4.57 MIL/uL (ref 3.87–5.11)
RDW: 13.1 % (ref 11.5–15.5)
WBC: 9.5 10*3/uL (ref 4.0–10.5)
nRBC: 0 % (ref 0.0–0.2)

## 2018-12-16 LAB — APTT: aPTT: 43 seconds — ABNORMAL HIGH (ref 24–36)

## 2018-12-16 LAB — PROTIME-INR
INR: 2 — ABNORMAL HIGH (ref 0.8–1.2)
Prothrombin Time: 22.5 seconds — ABNORMAL HIGH (ref 11.4–15.2)

## 2018-12-16 LAB — SARS CORONAVIRUS 2 BY RT PCR (HOSPITAL ORDER, PERFORMED IN ~~LOC~~ HOSPITAL LAB): SARS Coronavirus 2: NEGATIVE

## 2018-12-16 NOTE — ED Provider Notes (Signed)
[facility] EMERGENCY DEPARTMENT Provider Note   CSN: [id number] Arrival date & time: 12/16/18  4098    History   Chief Complaint Chief Complaint  Patient presents with   Fall    HPI Kathryn Mathews is a 73 y.o. female.     The history is provided by the patient, the EMS personnel and medical records.  Fall  This is a new problem. The current episode started 1 to 2 hours ago. The problem occurs constantly. The problem has been resolved. Associated symptoms include headaches. Pertinent negatives include no chest pain, no abdominal pain and no shortness of breath. Nothing aggravates the symptoms. Nothing relieves the symptoms. She has tried rest, water and a cold compress for the symptoms. The treatment provided no relief.    Past Medical History:  Diagnosis Date   Alopecia    well controlled per previous PCP - improved with biotin   Anxiety    when around ppl   Aorto-iliac atherosclerosis (HCC) 05/2016   by xray   Bipolar disorder (HCC)    no psych   Chronic sinusitis    Closed left humeral fracture 2010   closed left humeral neck fracture, also h/o L wrist fx   Closed right humeral fracture 2011   coracoid nondisplaced, conservative therapy (Dr. Cleon Gustin)   Depression    Diverticulosis    by colonoscopy   Falls    completed HHPT 10/2011, declined outpt PT. completed outpt PT 08/2016.   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   Memory deficit 05/27/2017   Long term (current) use of anticoagulants 04/29/2017   Gait disturbance 04/22/2017   Right hip pain 06/13/2016   Aorto-iliac atherosclerosis (HCC) 05/14/2016   TIA (transient ischemic attack) 10/27/2015   Health maintenance examination 04/12/2015   Insomnia 04/12/2015   Chronic nausea 09/09/2014   Anginal chest pain at rest [facility]) 09/09/2014   Shortness of breath    Abnormal nuclear stress test 09/04/2014   Essential hypertension 09/02/2014   Skin rash 05/29/2014   Advanced care planning/counseling discussion 04/10/2014   Recurrent falls 03/28/2014   Encounter for therapeutic drug monitoring 08/22/2013   Ventral hernia 09/07/2012   Unspecified constipation 09/07/2012   Abdominal discomfort 08/09/2012   Spondylolisthesis, grade 3 07/15/2012   Compression fracture of L1 lumbar vertebra (HCC) 07/15/2012   Loss of taste 03/09/2012   Imbalance 09/18/2011   Dysphagia 07/21/2011   Medicare annual wellness visit, subsequent 02/18/2011   Vitamin D deficiency 02/18/2011  Osteopenia    Bipolar disorder (HCC)    GERD (gastroesophageal reflux disease)    HLD (hyperlipidemia)    Hypothyroid    History of DVT (deep vein thrombosis)    PAD (peripheral artery disease) (HCC)     Past Surgical History:  Procedure Laterality Date   ARTERIAL THROMBECTOMY  07/2009   with R mid popliteal artery angioplasty [balloon] (2011)[[[[   CARDIAC CATHETERIZATION  08/2014   nl EF, small branch OM not amenable to PCI [name])   CESAREAN SECTION     CESAREAN SECTION  1973   CHOLECYSTECTOMY      COLONOSCOPY  08/2008   int/ext hem, severe diverticulosis with evidence of itis, rec rpt 10 yrs   hospitalization  01/2007   depression, ECT   INGUINAL HERNIA REPAIR  1980s   x2 on right   LEFT HEART CATHETERIZATION WITH CORONARY ANGIOGRAM N/A 09/05/2014   Procedure: LEFT HEART CATHETERIZATION WITH CORONARY ANGIOGRAM;  Surgeon: Marykay Lex, MD;  Location: [facility] CATH LAB;  Service: Cardiovascular;  Laterality: N/A;     OB History    Gravida  0   Para  0   Term  0   Preterm  0   AB  0   Living        SAB  0   TAB  0   Ectopic  0   Multiple      Live Births               Home Medications    Prior to Admission medications   Medication Sig Start Date End Date Taking? Authorizing Provider  acetaminophen (TYLENOL) 325 MG tablet Take 2 tablets (650 mg total) by mouth every 6 (six) hours as needed. 06/05/18   Horton, Mayer Masker, MD  acetaminophen (TYLENOL) 500 MG tablet Take 500 mg by mouth every 8 (eight) hours as needed for moderate pain.    [provider]  ARIPiprazole (ABILIFY) 5 MG tablet Take 1 tablet (5 mg total) by mouth daily. 12/09/18   Clapacs, Jackquline Denmark, MD  Artificial Tear Solution (SOOTHE XP OP) Apply 1 drop to eye 2 (two) times daily as needed.    [provider]  atorvastatin (LIPITOR) 20 MG tablet Take 1 tablet (20 mg total) by mouth daily. 07/22/18   Eustaquio Boyden, MD  Biotin 1000 MCG tablet Take 1 tablet (1 mg total) by mouth 2 (two) times daily. 04/24/16   Eustaquio Boyden, MD  buPROPion [facility] SR) 200 MG 12 hr tablet Take 1 tablet (200 mg total) by mouth 2 (two) times daily. 12/09/18   Clapacs, Jackquline Denmark, MD  Calcium Carb-Cholecalciferol (CALCIUM-VITAMIN D) 600-400 MG-UNIT TABS Take 1 tablet by mouth daily. 11/24/17   Eustaquio Boyden, MD  Cholecalciferol (VITAMIN D3) 25 MCG (1000 UT) CAPS Take 1 capsule (1,000 Units total) by mouth daily. 07/13/18   Eustaquio Boyden, MD  clonazePAM (KLONOPIN) 0.5 MG tablet TAKE 1 TABLET (0.5 MG  TOTAL) BY MOUTH 2 (TWO) TIMES DAILY. 11/08/18   Clapacs, Jackquline Denmark, MD  clonazePAM (KLONOPIN) 0.5 MG tablet Take 1 tablet (0.5 mg total) by mouth 2 (two) times daily. 12/09/18   Clapacs, Jackquline Denmark, MD  Cyanocobalamin (VITAMIN B-12 PO)  09/20/17   [provider]  cyclobenzaprine (FLEXERIL) 5 MG tablet Take 1 tablet (5 mg total) by mouth 2 (two) times daily. 04/23/17   Rai, Ripudeep K, MD  diclofenac sodium (VOLTAREN) 1 % GEL Apply 1 application topically 2 (two) times daily as needed. Patient taking  differently: Apply 1 application topically 2 (two) times daily as needed (pain).  06/13/16   Eustaquio BoydenGutierrez, Javier, MD  Docusate Calcium (STOOL SOFTENER PO) Take 1 tablet by mouth daily as needed (constipation). Reported on 11/05/2015    [provider]  lamoTRIgine (LAMICTAL) 100 MG tablet Take 1 tablet (100 mg total) by mouth 2 (two) times daily. 12/09/18   Clapacs, Jackquline DenmarkJohn T, MD  levothyroxine (SYNTHROID, LEVOTHROID) 50 MCG tablet Take 1 tablet (50 mcg total) by mouth daily. 07/23/18   Eustaquio BoydenGutierrez, Javier, MD  PRESCRIPTION MEDICATION Place 2 drops into both eyes daily as needed (dryness). Fresh coat    [provider]  Propylene Glycol (SYSTANE COMPLETE OP) Apply 2 drops to eye 2 (two) times daily as needed.    [provider]  traMADol (ULTRAM) 50 MG tablet Take 1 tablet (50 mg total) by mouth 2 (two) times daily as needed for moderate pain. 04/23/17   Rai, Ripudeep K, MD  warfarin (COUMADIN) 5 MG tablet TAKE DAILY AS DIRECTED BY COUMADIN CLINIC.  Take 1-2 pills daily as directed by coumadin clinic. 11/24/17   Eustaquio BoydenGutierrez, Javier, MD    Family History Family History  Problem Relation Age of Onset   Cancer Father    Heart attack Mother [id number]   Coronary artery disease Mother [id number]   Hyperlipidemia Mother    Heart disease Mother    Hypertension Mother    Breast cancer Maternal Aunt [id number]   Coronary artery disease Maternal Uncle    Coronary artery disease Maternal Grandmother [id number]    Stroke Neg Hx    Diabetes Neg Hx     Social History Social History   Tobacco Use   Smoking status: Never Smoker   Smokeless tobacco: Never Used  Substance Use Topics   Alcohol use: No    Alcohol/week: 0.0 standard drinks   Drug use: No     Allergies   Codeine; Acyclovir and related; and Hydrocodone   Review of Systems Review of Systems  Respiratory: Negative for shortness of breath.   Cardiovascular: Negative for chest pain.  Gastrointestinal: Negative for abdominal pain.  Neurological: Positive for headaches.  All other systems reviewed and are negative.    Physical Exam Updated Vital Signs BP (!) 161/63    Pulse 71    Temp 98.2 F (36.8 C) (Oral)    Resp 20    Ht 4\' 7"  (1.397 m)    Wt 70.3 kg    SpO2 97%    BMI 36.03 kg/m   Physical Exam Vitals signs and nursing note reviewed.  Constitutional:      General: She is not in acute distress.    Appearance: She is well-developed.  HENT:     Head: Normocephalic.     Comments: No obvious depressions concerning for skull fracture appreciated  Negative battle sign, no bruising, no raccoon eyes Eyes:     Extraocular Movements: Extraocular movements intact.     Conjunctiva/sclera: Conjunctivae normal.     Pupils: Pupils are equal, round, and reactive to light.  Neck:     Musculoskeletal: Neck supple. Muscular tenderness present.     Comments: Tenderness with palpation of C-spine midline, cervical collar placed  No obvious tenderness on palpation of thoracic or lumbar spine midline. Cardiovascular:     Rate and Rhythm: Normal rate and regular rhythm.     Heart sounds: No murmur.  Pulmonary:     Effort: Pulmonary effort is normal. No respiratory distress.     Breath sounds: Normal  breath sounds.  Abdominal:     Palpations: Abdomen is soft.     Tenderness: There is no abdominal tenderness.  Skin:    General: Skin is warm and dry.  Neurological:     Mental Status: She is alert.     Comments: Motor strength  intact bilateral upper extremities, bilateral lower extremities Sensation intact bilateral upper extremities, bilateral lower extremities      ED Treatments / Results  Labs (all labs ordered are listed, but only abnormal results are displayed) Labs Reviewed  BASIC METABOLIC PANEL - Abnormal; Notable for the following components:      Result Value   Glucose, Bld 105 (*)    All other components within normal limits  APTT - Abnormal; Notable for the following components:   aPTT 43 (*)    All other components within normal limits  PROTIME-INR - Abnormal; Notable for the following components:   Prothrombin Time 22.5 (*)    INR 2.0 (*)    All other components within normal limits  SARS CORONAVIRUS 2 (HOSPITAL ORDER, PERFORMED IN [facility])  CBC WITH DIFFERENTIAL/PLATELET    EKG None  Radiology Ct Head Wo Contrast  Result Date: 12/16/2018 CLINICAL DATA:  Larey Seat and hit head today. EXAM: CT HEAD WITHOUT CONTRAST CT CERVICAL SPINE WITHOUT CONTRAST TECHNIQUE: Multidetector CT imaging of the head and cervical spine was performed following the standard protocol without intravenous contrast. Multiplanar CT image reconstructions of the cervical spine were also generated. COMPARISON:  CT scan 06/05/2018 FINDINGS: CT HEAD FINDINGS Brain: Stable age advanced cerebral atrophy, ventriculomegaly and periventricular white matter disease. No extra-axial fluid collections are identified. No CT findings for acute hemispheric infarction or intracranial hemorrhage. No mass lesions. The brainstem and cerebellum are normal. Vascular: Scattered vascular calcifications but no aneurysm or hyperdense vessels. Skull: No skull fracture or bone lesions. Sinuses/Orbits: The paranasal sinuses and mastoid air cells are clear. The globes are intact. Other: No scalp lesions or hematoma. CT CERVICAL SPINE FINDINGS Alignment: There is a mildly displaced type 3 dens fracture with approximately 3 mm of posterior  displacement. Associated prevertebral soft tissue swelling/hematoma. The overall alignment is grossly maintained. Degenerative cervical spondylosis with advanced disc disease at C4-5 and C5-6 with degenerative subluxations. Skull base and vertebrae: Type 3 dens fracture as detailed above. Incomplete posterior arch of C1 is noted. There is a small fracture involving the lateral aspect of the arch of C1. Soft tissues and spinal canal: Prevertebral soft tissue swelling but no spinal canal hematoma or compromise. Disc levels: Generous spinal canal. No significant spinal stenosis. Facet disease and uncinate spurring contributing to mild multilevel foraminal narrowing. Upper chest: The lung apices are grossly clear. Other: No significant findings in the neck. IMPRESSION: 1. Stable age advanced cerebral atrophy, ventriculomegaly and periventricular white matter disease. 2. No acute intracranial findings or skull fracture. 3. Mildly displaced type 3 dens fracture. 4. Small fracture involving the lateral arch of C1 on the right side. Congenital incomplete posterior arch of C1. Electronically Signed   By: Rudie Meyer M.D.   On: 12/16/2018 20:40   Ct Cervical Spine Wo Contrast  Result Date: 12/16/2018 CLINICAL DATA:  Larey Seat and hit head today. EXAM: CT HEAD WITHOUT CONTRAST CT CERVICAL SPINE WITHOUT CONTRAST TECHNIQUE: Multidetector CT imaging of the head and cervical spine was performed following the standard protocol without intravenous contrast. Multiplanar CT image reconstructions of the cervical spine were also generated. COMPARISON:  CT scan 06/05/2018 FINDINGS: CT HEAD FINDINGS Brain: Stable  age advanced cerebral atrophy, ventriculomegaly and periventricular white matter disease. No extra-axial fluid collections are identified. No CT findings for acute hemispheric infarction or intracranial hemorrhage. No mass lesions. The brainstem and cerebellum are normal. Vascular: Scattered vascular calcifications but no  aneurysm or hyperdense vessels. Skull: No skull fracture or bone lesions. Sinuses/Orbits: The paranasal sinuses and mastoid air cells are clear. The globes are intact. Other: No scalp lesions or hematoma. CT CERVICAL SPINE FINDINGS Alignment: There is a mildly displaced type 3 dens fracture with approximately 3 mm of posterior displacement. Associated prevertebral soft tissue swelling/hematoma. The overall alignment is grossly maintained. Degenerative cervical spondylosis with advanced disc disease at C4-5 and C5-6 with degenerative subluxations. Skull base and vertebrae: Type 3 dens fracture as detailed above. Incomplete posterior arch of C1 is noted. There is a small fracture involving the lateral aspect of the arch of C1. Soft tissues and spinal canal: Prevertebral soft tissue swelling but no spinal canal hematoma or compromise. Disc levels: Generous spinal canal. No significant spinal stenosis. Facet disease and uncinate spurring contributing to mild multilevel foraminal narrowing. Upper chest: The lung apices are grossly clear. Other: No significant findings in the neck. IMPRESSION: 1. Stable age advanced cerebral atrophy, ventriculomegaly and periventricular white matter disease. 2. No acute intracranial findings or skull fracture. 3. Mildly displaced type 3 dens fracture. 4. Small fracture involving the lateral arch of C1 on the right side. Congenital incomplete posterior arch of C1. Electronically Signed   By: Rudie Meyer M.D.   On: 12/16/2018 20:40    Procedures Procedures (including critical care time)  Medications Ordered in ED Medications - No data to display   Initial Impression / Assessment and Plan / ED Course  I have reviewed the triage vital signs and the nursing notes.  Pertinent labs & imaging results that were available during my care of the patient were reviewed by me and considered in my medical decision making (see chart for details).        Medical Decision  Making:  AMIJAH TIMOTHY is a 73 y.o. female who presented to the ED today with status post fall.  Past medical history significant for hypertension, hyperlipidemia, depression, anxiety, bipolar disorder, DVT/PE on anticoagulation with Coumadin Reviewed and confirmed nursing documentation for past medical history, family history, social history. On my initial exam, the pt was calm, alert and interactive, not tachycardic, not tachypneic, no increased work of breathing or respiratory distress, not hypotensive, GCS 15, mechanical fall while ambulating, hit back of her head, on systemic anticoagulation, concern for intracranial pathology, hemorrhage versus skull fracture, significant C-spine tenderness with palpation, hard collar placed, will obtain CT imaging to evaluate for fracture versus dislocation versus other cervical process.  No other focal bony tenderness appreciated on secondary exam.   CT imaging revealed type III dens fracture, neurosurgery consulted. All radiology and laboratory studies reviewed independently and with my attending physician, agree with reading provided by radiologist unless otherwise noted.  Discussed case with neurosurgery, they reviewed all imaging, patient will have hard collar at all times, will follow-up as an outpatient with Dr. Lovell Sheehan, neurosurgery clinic for further evaluation and care.   Upon reassessing patient, patient was calm, resting comfortably, no new complaints Based on the above findings, I believe patient is hemodynamically stable for discharge.   Patient educated about specific return precautions for given chief complaint and symptoms.  Patient educated about follow-up with PCP and neurosurgery.  Patient expressed understanding of return precautions and need for follow-up.  Patient discharged.  The above care was discussed with and agreed upon by my attending physician   Emergency Department Medication Summary:  Medications - No data to  display  Final Clinical Impressions(s) / ED Diagnoses   Final diagnoses:  None    ED Discharge Orders    None       Erick Alley, MD 12/16/18 2220    Charlynne Pander, MD 12/23/18 [phone]

## 2018-12-16 NOTE — ED Notes (Signed)
Discharge instructions discussed with pt and husband via phone (per pt request) pt. verbalized understanding and follow up instructions.   Pt. To go home with husband. No questions at this time.  Signature pad downtime

## 2018-12-16 NOTE — ED Triage Notes (Signed)
Pt BIB EMS after fall on rug. Pt reports neck pain and back of the head pain. Pt states landing on head and hearing some noise from her neck.   Pt. On blood thinner Warfarin.  Pt. A&O x4. No bleeding.

## 2018-12-16 NOTE — Discharge Instructions (Addendum)
Wear Aspen hard collar at all times, you may remove collar for showering, otherwise should wear the collar around-the-clock until follow-up with Dr. Lovell Sheehan, neurosurgery, contact #[phone]. [phone]. Advance activities of daily living as tolerated. Stand up and ambulate with assistance until comfortable doing independently.      Advance diet as tolerated based on nausea, vomiting. Start with clear liquids and soft mechanical and advance.      Please return to ED for chest pain, shortness of breath, uncontrolled nausea, vomiting, any new concerning symptoms.      If prescribed any medications please take appropriate dose as prescribed and appropriate frequency has prescribed.    Please follow up with your PCP in 2-4 weeks or sooner if needed.

## 2018-12-16 NOTE — ED Notes (Signed)
Patient transported to CT 

## 2018-12-17 ENCOUNTER — Telehealth: Payer: Self-pay

## 2018-12-17 NOTE — Telephone Encounter (Signed)
Pt was seen in ED on 12/16/18 with mildly displaced type 3 dens fx and small fx involving lateral arch of C1 on rt side.pt said the hard neck collar is too loose and wants tightened. Dr Reece Agar advised for pt to contact neurosurgeon to see if the adjustment to collar could be done by neurosurgeon. Pt voiced understanding and will try to contact neurosurgeon to see if can get neck brace tightened. If not pt will cb to see if Dr Reece Agar can try to tighten collar. Pt will also cb to schedule appt with Dr Reece Agar next wk for FU.FYI to Dr Sharen Hones.

## 2018-12-17 NOTE — Telephone Encounter (Signed)
Best number [phone] Pt called back needing someone to adjust her neck brace.

## 2018-12-17 NOTE — Telephone Encounter (Addendum)
I was not notified of this message and am just now getting to it - and we're closed. Will need to await eval for next week.  Called, no answer. Left message. Suggested [facility] if worsening pain from collar.  Please call and schedule appt Monday.

## 2018-12-20 ENCOUNTER — Telehealth: Payer: Self-pay

## 2018-12-20 ENCOUNTER — Ambulatory Visit (INDEPENDENT_AMBULATORY_CARE_PROVIDER_SITE_OTHER): Payer: Medicare HMO | Admitting: Family Medicine

## 2018-12-20 ENCOUNTER — Other Ambulatory Visit: Payer: Self-pay

## 2018-12-20 ENCOUNTER — Encounter: Payer: Self-pay | Admitting: Family Medicine

## 2018-12-20 VITALS — BP 124/72 | HR 78 | Temp 98.1°F | Ht <= 58 in | Wt 155.1 lb

## 2018-12-20 DIAGNOSIS — Z5181 Encounter for therapeutic drug level monitoring: Secondary | ICD-10-CM | POA: Diagnosis not present

## 2018-12-20 DIAGNOSIS — S12100A Unspecified displaced fracture of second cervical vertebra, initial encounter for closed fracture: Secondary | ICD-10-CM

## 2018-12-20 DIAGNOSIS — R2689 Other abnormalities of gait and mobility: Secondary | ICD-10-CM

## 2018-12-20 NOTE — Telephone Encounter (Signed)
I spoke with pt and she scheduled in office appt with Dr Darnell Level today at 11 AM. FYI to Dr Darnell Level.

## 2018-12-20 NOTE — Telephone Encounter (Signed)
[facility] Night - Client TELEPHONE ADVICE RECORD [facility] Patient Name: Kathryn Mathews Gender: Female DOB: February 12, 1946 Age: 73 Y 34 M 24 D Return Phone Number: [phone] (Primary), [phone] (Secondary) Address: [place] [address] Client [facility] Night - Client Client Site [place] Physician Ria Bush - MD Contact Type Call Who Is Calling Patient / Member / Family / Caregiver Call Type Triage / Clinical Relationship To Patient Self Return Phone Number [phone] (Primary) Chief Complaint Medical Device, Procedure and Surgery Questions (non symptomatic) Reason for Call Symptomatic / Request for [place] states spoke to someone at the office who was suppose to call her back about her neck fracture but no one has reached out. Caller states her neck brace came loose and is requesting for directives. Translation No Nurse Assessment Nurse: Asa Lente, RN, Belenda Cruise Date/Time (Eastern Time): 12/17/2018 6:13:17 PM Confirm and document reason for call. If symptomatic, describe symptoms. ---Has neck brace that is loose. Has a neck fracture. Emergency room placed after fall yesterday. Hard plastic brace with velcrow. Her son tightened the brace. Told would need follow up. Sees Dr. Danise Mina PCP, was to set up appointment with Neurosurgeon. Has the patient had close contact with a person known or suspected to have the novel coronavirus illness OR traveled / lives in area with major community spread (including international travel) in the last 14 days from the onset of symptoms? * If Asymptomatic, screen for exposure and travel within the last 14 days. ---No Does the patient have any new or worsening symptoms? ---No Please document clinical information provided and list any resource used. ---No new or worsening symptoms. Will call PCP  and Neurosurgeon. Guidelines Guideline Title Affirmed Question Affirmed Notes Nurse Date/Time (Eastern Time) Disp. Time [name] Time) Disposition Final User 12/17/2018 5:52:27 PM Attempt made - message left [name] 12/17/2018 6:23:17 PM Clinical Call Yes Asa Lente, RN, Berneta Sages Disagree/Comply Comply Caller Understands Yes PLEASE NOTE: All timestamps contained within this report are represented as [place] Standard Time. CONFIDENTIALTY NOTICE: This fax transmission is intended only for the addressee. It contains information that is legally privileged, confidential or otherwise protected from use or disclosure. If you are not the intended recipient, you are strictly prohibited from reviewing, disclosing, copying using or disseminating any of this information or taking any action in reliance on or regarding this information. If you have received this fax in error, please notify us immediately by telephone so that we can arrange for its return to [place]. Phone: [phone], Toll-Free: [phone], Fax: [phone] Page: 2 of 2 Call Id: [id number] PreDisposition Call Doctor Referrals REFERRED TO PCP OFFICE

## 2018-12-20 NOTE — Assessment & Plan Note (Signed)
INR in ER 2.0. Touched base with coumadin clinic - they will review dosing with patient.

## 2018-12-20 NOTE — Patient Instructions (Signed)
Call Dr Arnoldo Morale office for appointment. Hard collar tightened today.  Keep on as much as feasible.

## 2018-12-20 NOTE — Telephone Encounter (Signed)
Patient in today to see Dr. Danise Mina for ER follow up from last week after suffering a fall and a subsequent neck fracture.   They checked her INR in the ER and it was 2.0 which is in range for her.   Patient and husband both state that they did not have her hold or change her dosing and she has continued on the same since last coag check.  She is not on any new medication and at this time does not have any procedures planned.  She follows up with the neurosurgeon this week and will call me if anything changes or any new medications are started.   For now we will continue her on the same dose and recheck her INR on 01/13/19 unless anything changes.   FYI to Dr. Darnell Level and Villa Herb, RN.

## 2018-12-20 NOTE — Assessment & Plan Note (Signed)
Type 3 dens fracture. ER records reviewed. Encouraged they call neurosurgery to schedule f/u. Hard collar tightened. Pt and husband agree with plan.

## 2018-12-20 NOTE — Assessment & Plan Note (Signed)
Multifactorial 

## 2018-12-20 NOTE — Progress Notes (Signed)
This visit was conducted in person.  BP 124/72   Pulse 78   Temp 98.1 F (36.7 C) (Oral)   Ht 4\' 7"  (1.397 m)   Wt 155 lb 1.6 oz (70.4 kg)   SpO2 96%   BMI 36.05 kg/m    CC: ER f/u visit Subjective:    Patient ID: Kathryn Mathews, female    DOB: 11-Oct-1945, 73 y.o.   MRN: [id number]  HPI: Kathryn Mathews is a 73 y.o. female presenting on 12/20/2018 for Follow-up (pt c/o of neck brace being loose,get very hot--not sure if it is the right fit. neck hurt at times. INR checked?--has appt on Thursday)   Recent ER visit 12/16/2018 after fall, found to have type III dens fracture - neurosurgery recommended hard collar at all times with outpatient f/u.   Larey Seat forward while doing laundry, lost balance.  Hasn't made appt with neurosurgery yet.      Relevant past medical, surgical, family and social history reviewed and updated as indicated. Interim medical history since our last visit reviewed. Allergies and medications reviewed and updated. Outpatient Medications Prior to Visit  Medication Sig Dispense Refill  . acetaminophen (TYLENOL) 325 MG tablet Take 2 tablets (650 mg total) by mouth every 6 (six) hours as needed. 30 tablet 0  . acetaminophen (TYLENOL) 500 MG tablet Take 500 mg by mouth every 8 (eight) hours as needed for moderate pain.    . ARIPiprazole (ABILIFY) 5 MG tablet Take 1 tablet (5 mg total) by mouth daily. 30 tablet 5  . Artificial Tear Solution (SOOTHE XP OP) Apply 1 drop to eye 2 (two) times daily as needed.    Marland Kitchen atorvastatin (LIPITOR) 20 MG tablet Take 1 tablet (20 mg total) by mouth daily. 90 tablet 3  . Biotin 1000 MCG tablet Take 1 tablet (1 mg total) by mouth 2 (two) times daily. 180 tablet 3  . buPROPion (WELLBUTRIN SR) 200 MG 12 hr tablet Take 1 tablet (200 mg total) by mouth 2 (two) times daily. 180 tablet 1  . Calcium Carb-Cholecalciferol (CALCIUM-VITAMIN D) 600-400 MG-UNIT TABS Take 1 tablet by mouth daily. 60 tablet   . Cholecalciferol (VITAMIN D3) 25 MCG  (1000 UT) CAPS Take 1 capsule (1,000 Units total) by mouth daily. 30 capsule   . clonazePAM (KLONOPIN) 0.5 MG tablet TAKE 1 TABLET (0.5 MG TOTAL) BY MOUTH 2 (TWO) TIMES DAILY. 180 tablet 1  . clonazePAM (KLONOPIN) 0.5 MG tablet Take 1 tablet (0.5 mg total) by mouth 2 (two) times daily. 180 tablet 1  . Cyanocobalamin (VITAMIN B-12 PO)     . cyclobenzaprine (FLEXERIL) 5 MG tablet Take 1 tablet (5 mg total) by mouth 2 (two) times daily. 60 tablet 0  . diclofenac sodium (VOLTAREN) 1 % GEL Apply 1 application topically 2 (two) times daily as needed. (Patient taking differently: Apply 1 application topically 2 (two) times daily as needed (pain). ) 1 Tube 1  . Docusate Calcium (STOOL SOFTENER PO) Take 1 tablet by mouth daily as needed (constipation). Reported on 11/05/2015    . lamoTRIgine (LAMICTAL) 100 MG tablet Take 1 tablet (100 mg total) by mouth 2 (two) times daily. 180 tablet 1  . levothyroxine (SYNTHROID, LEVOTHROID) 50 MCG tablet Take 1 tablet (50 mcg total) by mouth daily. 90 tablet 3  . PRESCRIPTION MEDICATION Place 2 drops into both eyes daily as needed (dryness). Fresh coat    . Propylene Glycol (SYSTANE COMPLETE OP) Apply 2 drops to eye 2 (two) times daily  as needed.    . traMADol (ULTRAM) 50 MG tablet Take 1 tablet (50 mg total) by mouth 2 (two) times daily as needed for moderate pain. 20 tablet 0  . warfarin (COUMADIN) 5 MG tablet TAKE DAILY AS DIRECTED BY COUMADIN CLINIC.  Take 1-2 pills daily as directed by coumadin clinic. 120 tablet 3   No facility-administered medications prior to visit.      Per HPI unless specifically indicated in ROS section below Review of Systems Objective:    BP 124/72   Pulse 78   Temp 98.1 F (36.7 C) (Oral)   Ht 4\' 7"  (1.397 m)   Wt 155 lb 1.6 oz (70.4 kg)   SpO2 96%   BMI 36.05 kg/m   Wt Readings from Last 3 Encounters:  12/20/18 155 lb 1.6 oz (70.4 kg)  12/16/18 155 lb (70.3 kg)  07/13/18 150 lb 12 oz (68.4 kg)    Physical Exam Vitals signs  and nursing note reviewed.  Constitutional:      Appearance: Normal appearance. She is not ill-appearing.  HENT:     Head: Normocephalic and atraumatic.  Neck:     Musculoskeletal: Normal range of motion and neck supple. Muscular tenderness present.     Comments: Posterior superior cervical midline tenderness. Hard collar tightened today with reported relief  Neurological:     Mental Status: She is alert.       Results for orders placed or performed during the hospital encounter of 12/16/18  SARS Coronavirus 2 (CEPHEID - Performed in [facility] hospital lab), [facility] Order  Result Value Ref Range   SARS Coronavirus 2 NEGATIVE NEGATIVE  CBC with Differential  Result Value Ref Range   WBC 9.5 4.0 - 10.5 K/uL   RBC 4.57 3.87 - 5.11 MIL/uL   Hemoglobin 14.5 12.0 - 15.0 g/dL   HCT 33.2 95.1 - 88.4 %   MCV 98.0 80.0 - 100.0 fL   MCH 31.7 26.0 - 34.0 pg   MCHC 32.4 30.0 - 36.0 g/dL   RDW 16.6 06.3 - 01.6 %   Platelets 197 150 - 400 K/uL   nRBC 0.0 0.0 - 0.2 %   Neutrophils Relative % 80 %   Neutro Abs 7.6 1.7 - 7.7 K/uL   Lymphocytes Relative 13 %   Lymphs Abs 1.2 0.7 - 4.0 K/uL   Monocytes Relative 5 %   Monocytes Absolute 0.5 0.1 - 1.0 K/uL   Eosinophils Relative 2 %   Eosinophils Absolute 0.2 0.0 - 0.5 K/uL   Basophils Relative 0 %   Basophils Absolute 0.0 0.0 - 0.1 K/uL   Immature Granulocytes 0 %   Abs Immature Granulocytes 0.04 0.00 - 0.07 K/uL  Basic metabolic panel  Result Value Ref Range   Sodium 138 135 - 145 mmol/L   Potassium 4.7 3.5 - 5.1 mmol/L   Chloride 106 98 - 111 mmol/L   CO2 24 22 - 32 mmol/L   Glucose, Bld 105 (H) 70 - 99 mg/dL   BUN 14 8 - 23 mg/dL   Creatinine, Ser 0.10 0.44 - 1.00 mg/dL   Calcium 9.8 8.9 - 93.2 mg/dL   GFR calc non Af Amer >60 >60 mL/min   GFR calc Af Amer >60 >60 mL/min   Anion gap 8 5 - 15  APTT  Result Value Ref Range   aPTT 43 (H) 24 - 36 seconds  Protime-INR  Result Value Ref Range   Prothrombin Time 22.5 (H) 11.4 - 15.2  seconds  INR 2.0 (H) 0.8 - 1.2   Lab Results  Component Value Date   INR 2.0 (H) 12/16/2018   INR 2.5 11/25/2018   INR 1.8 (A) 10/28/2018    Assessment & Plan:   Problem List Items Addressed This Visit    Imbalance    Multifactorial.       Encounter for therapeutic drug monitoring    INR in ER 2.0. Touched base with coumadin clinic - they will review dosing with patient.       Dens fracture, closed, initial encounter (HCC) - Primary    Type 3 dens fracture. ER records reviewed. Encouraged they call neurosurgery to schedule f/u. Hard collar tightened. Pt and husband agree with plan.           No orders of the defined types were placed in this encounter.  No orders of the defined types were placed in this encounter.   Follow up plan: No follow-ups on file.  Eustaquio Boyden, MD

## 2018-12-21 NOTE — Telephone Encounter (Signed)
Noted  

## 2018-12-23 ENCOUNTER — Other Ambulatory Visit: Payer: Medicare HMO

## 2018-12-24 DIAGNOSIS — S12120A Other displaced dens fracture, initial encounter for closed fracture: Secondary | ICD-10-CM | POA: Diagnosis not present

## 2018-12-24 DIAGNOSIS — M542 Cervicalgia: Secondary | ICD-10-CM | POA: Diagnosis not present

## 2018-12-31 DIAGNOSIS — M542 Cervicalgia: Secondary | ICD-10-CM | POA: Diagnosis not present

## 2019-01-11 DIAGNOSIS — S12100G Unspecified displaced fracture of second cervical vertebra, subsequent encounter for fracture with delayed healing: Secondary | ICD-10-CM | POA: Diagnosis not present

## 2019-01-11 DIAGNOSIS — M542 Cervicalgia: Secondary | ICD-10-CM | POA: Diagnosis not present

## 2019-01-13 ENCOUNTER — Other Ambulatory Visit (INDEPENDENT_AMBULATORY_CARE_PROVIDER_SITE_OTHER): Payer: Medicare HMO

## 2019-01-13 ENCOUNTER — Ambulatory Visit: Payer: Self-pay

## 2019-01-13 DIAGNOSIS — Z86718 Personal history of other venous thrombosis and embolism: Secondary | ICD-10-CM | POA: Diagnosis not present

## 2019-01-13 DIAGNOSIS — I739 Peripheral vascular disease, unspecified: Secondary | ICD-10-CM

## 2019-01-13 DIAGNOSIS — Z7901 Long term (current) use of anticoagulants: Secondary | ICD-10-CM

## 2019-01-13 LAB — POCT INR
INR: 2.1 (ref 2.0–3.0)
INR: 3.1 — AB (ref 2.0–3.0)

## 2019-01-21 NOTE — Patient Instructions (Signed)
Within range. Continue current dose of:  7.5 mg (5 mg x 1.5) every Mon, Fri; 5 mg (5 mg x 1) all other days  Schedule coumadin clinic visit in 4 weeks for recheck.  Patient was notified by Glorianne Manchester, CMA of directions from Dr. Danise Mina.

## 2019-02-11 DIAGNOSIS — M542 Cervicalgia: Secondary | ICD-10-CM | POA: Diagnosis not present

## 2019-02-11 DIAGNOSIS — M5481 Occipital neuralgia: Secondary | ICD-10-CM | POA: Diagnosis not present

## 2019-02-11 DIAGNOSIS — Z6834 Body mass index (BMI) 34.0-34.9, adult: Secondary | ICD-10-CM | POA: Diagnosis not present

## 2019-02-11 DIAGNOSIS — S12100G Unspecified displaced fracture of second cervical vertebra, subsequent encounter for fracture with delayed healing: Secondary | ICD-10-CM | POA: Diagnosis not present

## 2019-02-15 ENCOUNTER — Other Ambulatory Visit: Payer: Medicare HMO

## 2019-02-17 ENCOUNTER — Ambulatory Visit: Payer: Medicare HMO

## 2019-02-21 ENCOUNTER — Other Ambulatory Visit: Payer: Self-pay | Admitting: Neurosurgery

## 2019-02-21 DIAGNOSIS — S12100G Unspecified displaced fracture of second cervical vertebra, subsequent encounter for fracture with delayed healing: Secondary | ICD-10-CM

## 2019-02-23 ENCOUNTER — Ambulatory Visit
Admission: RE | Admit: 2019-02-23 | Discharge: 2019-02-23 | Disposition: A | Discharge status: Home / Self Care | Payer: Medicare HMO | Source: Ambulatory Visit | Attending: Neurosurgery | Admitting: Neurosurgery

## 2019-02-23 ENCOUNTER — Other Ambulatory Visit: Payer: Self-pay

## 2019-02-23 DIAGNOSIS — S13120D Subluxation of C1/C2 cervical vertebrae, subsequent encounter: Secondary | ICD-10-CM | POA: Diagnosis not present

## 2019-02-23 DIAGNOSIS — S12100G Unspecified displaced fracture of second cervical vertebra, subsequent encounter for fracture with delayed healing: Secondary | ICD-10-CM

## 2019-02-24 ENCOUNTER — Ambulatory Visit (INDEPENDENT_AMBULATORY_CARE_PROVIDER_SITE_OTHER): Payer: Medicare HMO | Admitting: General Practice

## 2019-02-24 DIAGNOSIS — Z7901 Long term (current) use of anticoagulants: Secondary | ICD-10-CM | POA: Diagnosis not present

## 2019-02-24 DIAGNOSIS — I739 Peripheral vascular disease, unspecified: Secondary | ICD-10-CM

## 2019-02-24 LAB — POCT INR: INR: 4.3 — AB (ref 2.0–3.0)

## 2019-02-24 NOTE — Patient Instructions (Addendum)
Pre visit review using our clinic review tool, if applicable. No additional management support is needed unless otherwise documented below in the visit note.  Hold coumadin today and tomorrow and then change dosage and take 1 tablet daily except 1 1/2 tablets only on Mondays.  Re-check in 2 weeks.

## 2019-03-02 DIAGNOSIS — S12120A Other displaced dens fracture, initial encounter for closed fracture: Secondary | ICD-10-CM | POA: Diagnosis not present

## 2019-03-10 ENCOUNTER — Ambulatory Visit (INDEPENDENT_AMBULATORY_CARE_PROVIDER_SITE_OTHER): Payer: Medicare HMO | Admitting: General Practice

## 2019-03-10 DIAGNOSIS — Z7901 Long term (current) use of anticoagulants: Secondary | ICD-10-CM

## 2019-03-10 LAB — POCT INR: INR: 1.9 — AB (ref 2.0–3.0)

## 2019-03-10 NOTE — Patient Instructions (Addendum)
Pre visit review using our clinic review tool, if applicable. No additional management support is needed unless otherwise documented below in the visit note.  Take 1 1/2 tablets today (8/27) and then continue to take 1 tablet daily except 1 1/2 tablets only on Mondays.  Re-check in 3 to 4 weeks.

## 2019-03-29 DIAGNOSIS — Z6834 Body mass index (BMI) 34.0-34.9, adult: Secondary | ICD-10-CM | POA: Diagnosis not present

## 2019-03-29 DIAGNOSIS — S12100G Unspecified displaced fracture of second cervical vertebra, subsequent encounter for fracture with delayed healing: Secondary | ICD-10-CM | POA: Diagnosis not present

## 2019-03-29 DIAGNOSIS — R030 Elevated blood-pressure reading, without diagnosis of hypertension: Secondary | ICD-10-CM | POA: Diagnosis not present

## 2019-04-07 ENCOUNTER — Encounter: Payer: Self-pay | Admitting: Family Medicine

## 2019-04-07 ENCOUNTER — Other Ambulatory Visit: Payer: Self-pay

## 2019-04-07 ENCOUNTER — Ambulatory Visit (INDEPENDENT_AMBULATORY_CARE_PROVIDER_SITE_OTHER): Payer: Medicare HMO | Admitting: Family Medicine

## 2019-04-07 ENCOUNTER — Ambulatory Visit (INDEPENDENT_AMBULATORY_CARE_PROVIDER_SITE_OTHER): Payer: Medicare HMO | Admitting: General Practice

## 2019-04-07 VITALS — BP 126/80 | HR 85 | Temp 98.3°F | Ht <= 58 in | Wt 155.2 lb

## 2019-04-07 DIAGNOSIS — Z7901 Long term (current) use of anticoagulants: Secondary | ICD-10-CM | POA: Diagnosis not present

## 2019-04-07 DIAGNOSIS — L03221 Cellulitis of neck: Secondary | ICD-10-CM

## 2019-04-07 DIAGNOSIS — I739 Peripheral vascular disease, unspecified: Secondary | ICD-10-CM

## 2019-04-07 LAB — POCT INR: INR: 8 — AB (ref 2.0–3.0)

## 2019-04-07 MED ORDER — DOXYCYCLINE HYCLATE 100 MG PO TABS: 100.0000 mg | ORAL_TABLET | Freq: Two times a day (BID) | ORAL | 0 refills | Status: AC

## 2019-04-07 NOTE — Progress Notes (Signed)
Kathryn Trostel T. Matthew Pais, MD Primary Care and Sports Medicine [facility] [address] [place], [address] Phone: [phone]  FAX: [phone]  Kathryn Mathews - 73 y.o. female  MRN [id number]  Date of Birth: Jun 02, 1946  Visit Date: 04/07/2019  PCP: Eustaquio Boyden, MD  Referred by: Eustaquio Boyden, MD  Chief Complaint  Patient presents with  . Rash    on neck   Subjective:   Kathryn Mathews is a 73 y.o. very pleasant female patient who presents with the following:  Seeing Dr. Alford Highland for broken neck.  She has been in a collar for much of this time, and she is currently contemplating having additional neck surgery.  She has had an open wound on the bottom of her neck caudal to the top of the sternum.  This has occasionally leaks some clear fluid, but it has some surrounding redness and warmth that has worsened over the last couple of days.  Was wearing collar  Past Medical History, Surgical History, Social History, Family History, Problem List, Medications, and Allergies have been reviewed and updated if relevant.  Patient Active Problem List   Diagnosis Date Noted  . Dens fracture, closed, initial encounter (HCC) 12/20/2018  . Urine frequency 02/08/2018  . Systolic murmur 11/24/2017  . Bilateral sensorineural hearing loss 05/27/2017  . Memory deficit 05/27/2017  . Long term (current) use of anticoagulants 04/29/2017  . Gait disturbance 04/22/2017  . Right hip pain 06/13/2016  . Aorto-iliac atherosclerosis (HCC) 05/14/2016  . TIA (transient ischemic attack) 10/27/2015  . Health maintenance examination 04/12/2015  . Insomnia 04/12/2015  . Chronic nausea 09/09/2014  . Anginal chest pain at rest [facility]) 09/09/2014  . Shortness of breath   . Abnormal nuclear stress test 09/04/2014  . Essential hypertension 09/02/2014  . Skin rash 05/29/2014  . Advanced care planning/counseling discussion 04/10/2014  . Recurrent falls 03/28/2014   . Encounter for therapeutic drug monitoring 08/22/2013  . Ventral hernia 09/07/2012  . Unspecified constipation 09/07/2012  . Abdominal discomfort 08/09/2012  . Spondylolisthesis, grade 3 07/15/2012  . Compression fracture of L1 lumbar vertebra (HCC) 07/15/2012  . Loss of taste 03/09/2012  . Imbalance 09/18/2011  . Dysphagia 07/21/2011  . Medicare annual wellness visit, subsequent 02/18/2011  . Vitamin D deficiency 02/18/2011  . Osteopenia   . Bipolar disorder (HCC)   . GERD (gastroesophageal reflux disease)   . HLD (hyperlipidemia)   . Hypothyroid   . History of DVT (deep vein thrombosis)   . PAD (peripheral artery disease) (HCC)     Past Medical History:  Diagnosis Date  . Alopecia    well controlled per previous PCP - improved with biotin  . Anxiety    when around ppl  . Aorto-iliac atherosclerosis (HCC) 05/2016   by xray  . Bipolar disorder (HCC)    no psych  . Chronic sinusitis   . Closed left humeral fracture 2010   closed left humeral neck fracture, also h/o L wrist fx  . Closed right humeral fracture 2011   coracoid nondisplaced, conservative therapy (Dr. Cleon Gustin)  . Depression   . Diverticulosis    by colonoscopy  . Falls    completed HHPT 10/2011, declined outpt PT. completed outpt PT 08/2016.  Marland Kitchen GERD (gastroesophageal reflux disease)   . Glucose intolerance (impaired glucose tolerance)   . High cholesterol   . History of chicken pox   . History of DVT (deep vein thrombosis)    on chronic  coumadin since 1973, h/o blood clots on coumadin 2011, added ASA QD, but due to fall risk stopped and changed goal INR to 2.5-3.0  . HLD (hyperlipidemia)   . Humerus fracture   . Hyperactivity of bladder   . Hypothyroid    previously hyperthyroid  . Hypothyroidism   . Osteopenia 2010, 2016   DEXA 2010: T -1.3 L spine, -2.1 femoral neck. DEXA 2016: T -2.4 hip, T -0.5 spine; rpt 2 yrs  . PAD (peripheral artery disease) (HCC)   . Phlebitis    left leg stays swollen  .  Positive ANA (antinuclear antibody)   . Pulmonary embolism [facility])     Past Surgical History:  Procedure Laterality Date  . ARTERIAL THROMBECTOMY  07/2009   with R mid popliteal artery angioplasty [balloon] (2011)[[[[  . CARDIAC CATHETERIZATION  08/2014   nl EF, small branch OM not amenable to PCI [name])  . CESAREAN SECTION    . CESAREAN SECTION  1973  . CHOLECYSTECTOMY    . COLONOSCOPY  08/2008   int/ext hem, severe diverticulosis with evidence of itis, rec rpt 10 yrs  . hospitalization  01/2007   depression, ECT  . INGUINAL HERNIA REPAIR  1980s   x2 on right  . LEFT HEART CATHETERIZATION WITH CORONARY ANGIOGRAM N/A 09/05/2014   Procedure: LEFT HEART CATHETERIZATION WITH CORONARY ANGIOGRAM;  Surgeon: Marykay Lex, MD;  Location: [facility] CATH LAB;  Service: Cardiovascular;  Laterality: N/A;    Social History   Socioeconomic History  . Marital status: Married    Spouse name: Not on file  . Number of children: Not on file  . Years of education: Not on file  . Highest education level: Not on file  Occupational History    Employer: Retired    Comment: retired  Engineer, production  . Financial resource strain: Not on file  . Food insecurity    Worry: Not on file    Inability: Not on file  . Transportation needs    Medical: Not on file    Non-medical: Not on file  Tobacco Use  . Smoking status: Never Smoker  . Smokeless tobacco: Never Used  Substance and Sexual Activity  . Alcohol use: No    Alcohol/week: 0.0 standard drinks  . Drug use: No  . Sexual activity: Never  Lifestyle  . Physical activity    Days per week: Not on file    Minutes per session: Not on file  . Stress: Not on file  Relationships  . Social Musician on phone: Not on file    Gets together: Not on file    Attends religious service: Not on file    Active member of club or organization: Not on file    Attends meetings of clubs or organizations: Not on file    Relationship status: Not on file  .  Intimate partner violence    Fear of current or ex partner: Not on file    Emotionally abused: Not on file    Physically abused: Not on file    Forced sexual activity: Not on file  Other Topics Concern  . Not on file  Social History Narrative   Caffeine: 2 diet coke/day   Born: [place], [place]   Lives with husband, 1 cat.   1 grown son - lives nearby.   [place]oved from [place].   Activity: no regular exercise   Diet: good water, fruits/vegetables daily    Family History  Problem Relation Age of  Onset  . Cancer Father   . Heart attack Mother 12  . Coronary artery disease Mother 41  . Hyperlipidemia Mother   . Heart disease Mother   . Hypertension Mother   . Breast cancer Maternal Aunt 54  . Coronary artery disease Maternal Uncle   . Coronary artery disease Maternal Grandmother 70  . Stroke Neg Hx   . Diabetes Neg Hx     Allergies  Allergen Reactions  . Codeine Nausea And Vomiting  . Acyclovir And Related Nausea Only  . Hydrocodone Other (See Comments)    Imbalance/falls    Medication list reviewed and updated in full in [facility].   GEN: No acute illnesses, no fevers, chills. GI: No n/v/d, eating normally Pulm: No SOB Interactive and getting along well at home.  Otherwise, ROS is as per the HPI.  Objective:   BP 126/80   Pulse 85   Temp 98.3 F (36.8 C) (Other (Comment))   Ht 4\' 7"  (1.397 m)   Wt 155 lb 4 oz (70.4 kg)   SpO2 95%   BMI 36.08 kg/m   GEN: WDWN, NAD, Non-toxic, A & O x 3 HEENT: Atraumatic, Normocephalic. Neck supple. No masses, No LAD. Ears and Nose: No external deformity. EXTR: No c/c/e NEURO Normal gait.  PSYCH: Normally interactive. Conversant. Not depressed or anxious appearing.  Calm demeanor.       Laboratory and Imaging Data:  Assessment and Plan:     ICD-10-CM   1. Cellulitis, neck  L03.221   2. Long term (current) use of anticoagulants  Z79.01    Doxycycline to cover MRSA and she will need to have INR rechecked,  which was discussed with RN who will arrange this this afternoon.  Follow-up: Return in about 5 days (around 04/12/2019) for INR recheck.  Meds ordered this encounter  Medications  . doxycycline (VIBRA-TABS) 100 MG tablet    Sig: Take 1 tablet (100 mg total) by mouth 2 (two) times daily for 10 days.    Dispense:  20 tablet    Refill:  0   No orders of the defined types were placed in this encounter.   Signed,  Elpidio Galea. Arsal Tappan, MD   Outpatient Encounter Medications as of 04/07/2019  Medication Sig  . acetaminophen (TYLENOL) 325 MG tablet Take 2 tablets (650 mg total) by mouth every 6 (six) hours as needed.  Marland Kitchen acetaminophen (TYLENOL) 500 MG tablet Take 500 mg by mouth every 8 (eight) hours as needed for moderate pain.  . Artificial Tear Solution (SOOTHE XP OP) Apply 1 drop to eye 2 (two) times daily as needed.  Marland Kitchen atorvastatin (LIPITOR) 20 MG tablet Take 1 tablet (20 mg total) by mouth daily.  . Biotin 1000 MCG tablet Take 1 tablet (1 mg total) by mouth 2 (two) times daily.  Marland Kitchen buPROPion (WELLBUTRIN SR) 200 MG 12 hr tablet Take 1 tablet (200 mg total) by mouth 2 (two) times daily.  . Calcium Carb-Cholecalciferol (CALCIUM-VITAMIN D) 600-400 MG-UNIT TABS Take 1 tablet by mouth daily.  . Cholecalciferol (VITAMIN D3) 25 MCG (1000 UT) CAPS Take 1 capsule (1,000 Units total) by mouth daily.  . clonazePAM (KLONOPIN) 0.5 MG tablet Take 1 tablet (0.5 mg total) by mouth 2 (two) times daily.  . Cyanocobalamin (VITAMIN B-12 PO)   . cyclobenzaprine (FLEXERIL) 5 MG tablet Take 1 tablet (5 mg total) by mouth 2 (two) times daily.  . diclofenac sodium (VOLTAREN) 1 % GEL Apply 1 application topically 2 (two) times  daily as needed. (Patient taking differently: Apply 1 application topically 2 (two) times daily as needed (pain). )  . Docusate Calcium (STOOL SOFTENER PO) Take 1 tablet by mouth daily as needed (constipation). Reported on 11/05/2015  . lamoTRIgine (LAMICTAL) 100 MG tablet Take 1 tablet (100 mg  total) by mouth 2 (two) times daily.  Marland Kitchen levothyroxine (SYNTHROID, LEVOTHROID) 50 MCG tablet Take 1 tablet (50 mcg total) by mouth daily.  Marland Kitchen PRESCRIPTION MEDICATION Place 2 drops into both eyes daily as needed (dryness). Fresh coat  . Propylene Glycol (SYSTANE COMPLETE OP) Apply 2 drops to eye 2 (two) times daily as needed.  . traMADol (ULTRAM) 50 MG tablet Take 1 tablet (50 mg total) by mouth 2 (two) times daily as needed for moderate pain.  Marland Kitchen warfarin (COUMADIN) 5 MG tablet TAKE DAILY AS DIRECTED BY COUMADIN CLINIC.  Take 1-2 pills daily as directed by coumadin clinic.  Marland Kitchen ARIPiprazole (ABILIFY) 5 MG tablet Take 1 tablet (5 mg total) by mouth daily. (Patient not taking: Reported on 04/07/2019)  . doxycycline (VIBRA-TABS) 100 MG tablet Take 1 tablet (100 mg total) by mouth 2 (two) times daily for 10 days.  . [DISCONTINUED] clonazePAM (KLONOPIN) 0.5 MG tablet TAKE 1 TABLET (0.5 MG TOTAL) BY MOUTH 2 (TWO) TIMES DAILY.   No facility-administered encounter medications on file as of 04/07/2019.

## 2019-04-07 NOTE — Patient Instructions (Addendum)
Pre visit review using our clinic review tool, if applicable. No additional management support is needed unless otherwise documented below in the visit note.  Stop coumadin!  Re-check next Tuesday.  Go to ER if any unusual bleeding occurs.  Dr. Lorelei Pont also started patient on doxycycline today. Re-check on Tuesday with [facility].

## 2019-04-08 ENCOUNTER — Ambulatory Visit: Payer: Medicare HMO | Admitting: Family Medicine

## 2019-04-08 ENCOUNTER — Ambulatory Visit: Payer: Medicare HMO | Admitting: Internal Medicine

## 2019-04-12 ENCOUNTER — Ambulatory Visit (INDEPENDENT_AMBULATORY_CARE_PROVIDER_SITE_OTHER): Payer: Medicare HMO

## 2019-04-12 DIAGNOSIS — Z7901 Long term (current) use of anticoagulants: Secondary | ICD-10-CM | POA: Diagnosis not present

## 2019-04-12 DIAGNOSIS — I739 Peripheral vascular disease, unspecified: Secondary | ICD-10-CM

## 2019-04-12 LAB — POCT INR: INR: 1.1 — AB (ref 2.0–3.0)

## 2019-04-12 NOTE — Patient Instructions (Signed)
NR today 1.1  *patient does have 4 more days on abx therapy (doxycycline)   Take 7.5mg  today (9/29), 5mg  on 9/30 and 5mg  on 10/1, and then RECHECK on Friday when in to follow up with PCP.     Patient and husband verbalize understanding of instructions given today.  As patient is on abx therapy for cellulitis, will defer flu shot till next appointment.

## 2019-04-15 ENCOUNTER — Ambulatory Visit: Payer: Medicare HMO | Admitting: Family Medicine

## 2019-04-15 ENCOUNTER — Ambulatory Visit: Payer: Medicare HMO

## 2019-04-15 ENCOUNTER — Encounter: Payer: Self-pay | Admitting: Family Medicine

## 2019-04-15 ENCOUNTER — Emergency Department (HOSPITAL_COMMUNITY)
Admission: EM | Admit: 2019-04-15 | Discharge: 2019-04-16 | Disposition: A | Discharge status: Home / Self Care | Payer: Medicare HMO | Attending: Emergency Medicine | Admitting: Emergency Medicine

## 2019-04-15 ENCOUNTER — Emergency Department (HOSPITAL_COMMUNITY): Payer: Medicare HMO

## 2019-04-15 ENCOUNTER — Ambulatory Visit (INDEPENDENT_AMBULATORY_CARE_PROVIDER_SITE_OTHER): Payer: Medicare HMO | Admitting: Family Medicine

## 2019-04-15 ENCOUNTER — Other Ambulatory Visit: Payer: Self-pay

## 2019-04-15 VITALS — BP 122/78 | HR 83 | Temp 97.9°F | Ht <= 58 in | Wt 149.4 lb

## 2019-04-15 DIAGNOSIS — Z79899 Other long term (current) drug therapy: Secondary | ICD-10-CM | POA: Diagnosis not present

## 2019-04-15 DIAGNOSIS — Z7901 Long term (current) use of anticoagulants: Secondary | ICD-10-CM

## 2019-04-15 DIAGNOSIS — E039 Hypothyroidism, unspecified: Secondary | ICD-10-CM | POA: Diagnosis not present

## 2019-04-15 DIAGNOSIS — R410 Disorientation, unspecified: Secondary | ICD-10-CM | POA: Diagnosis not present

## 2019-04-15 DIAGNOSIS — S1190XA Unspecified open wound of unspecified part of neck, initial encounter: Secondary | ICD-10-CM | POA: Diagnosis not present

## 2019-04-15 DIAGNOSIS — R4781 Slurred speech: Secondary | ICD-10-CM

## 2019-04-15 DIAGNOSIS — R9431 Abnormal electrocardiogram [ECG] [EKG]: Secondary | ICD-10-CM | POA: Diagnosis not present

## 2019-04-15 DIAGNOSIS — Z86711 Personal history of pulmonary embolism: Secondary | ICD-10-CM | POA: Insufficient documentation

## 2019-04-15 DIAGNOSIS — S12100A Unspecified displaced fracture of second cervical vertebra, initial encounter for closed fracture: Secondary | ICD-10-CM

## 2019-04-15 DIAGNOSIS — Z03818 Encounter for observation for suspected exposure to other biological agents ruled out: Secondary | ICD-10-CM | POA: Diagnosis not present

## 2019-04-15 DIAGNOSIS — G9340 Encephalopathy, unspecified: Secondary | ICD-10-CM | POA: Diagnosis not present

## 2019-04-15 DIAGNOSIS — Z8673 Personal history of transient ischemic attack (TIA), and cerebral infarction without residual deficits: Secondary | ICD-10-CM | POA: Diagnosis not present

## 2019-04-15 DIAGNOSIS — Z86718 Personal history of other venous thrombosis and embolism: Secondary | ICD-10-CM | POA: Insufficient documentation

## 2019-04-15 DIAGNOSIS — R29818 Other symptoms and signs involving the nervous system: Secondary | ICD-10-CM | POA: Diagnosis not present

## 2019-04-15 LAB — PROTIME-INR
INR: 1.8 — ABNORMAL HIGH (ref 0.8–1.2)
Prothrombin Time: 20.3 seconds — ABNORMAL HIGH (ref 11.4–15.2)

## 2019-04-15 LAB — CBC
HCT: 41.6 % (ref 36.0–46.0)
Hemoglobin: 13.7 g/dL (ref 12.0–15.0)
MCH: 32.2 pg (ref 26.0–34.0)
MCHC: 32.9 g/dL (ref 30.0–36.0)
MCV: 97.9 fL (ref 80.0–100.0)
Platelets: DECREASED 10*3/uL (ref 150–400)
RBC: 4.25 MIL/uL (ref 3.87–5.11)
RDW: 14 % (ref 11.5–15.5)
WBC: 14 10*3/uL — ABNORMAL HIGH (ref 4.0–10.5)
nRBC: 0 % (ref 0.0–0.2)

## 2019-04-15 LAB — APTT: aPTT: 71 seconds — ABNORMAL HIGH (ref 24–36)

## 2019-04-15 LAB — DIFFERENTIAL
Abs Immature Granulocytes: 0.07 10*3/uL (ref 0.00–0.07)
Basophils Absolute: 0.1 10*3/uL (ref 0.0–0.1)
Basophils Relative: 0 %
Eosinophils Absolute: 0.4 10*3/uL (ref 0.0–0.5)
Eosinophils Relative: 3 %
Immature Granulocytes: 1 %
Lymphocytes Relative: 21 %
Lymphs Abs: 3 10*3/uL (ref 0.7–4.0)
Monocytes Absolute: 1 10*3/uL (ref 0.1–1.0)
Monocytes Relative: 7 %
Neutro Abs: 9.4 10*3/uL — ABNORMAL HIGH (ref 1.7–7.7)
Neutrophils Relative %: 68 %

## 2019-04-15 LAB — COMPREHENSIVE METABOLIC PANEL
ALT: 13 U/L (ref 0–44)
AST: 29 U/L (ref 15–41)
Albumin: 3.2 g/dL — ABNORMAL LOW (ref 3.5–5.0)
Alkaline Phosphatase: 89 U/L (ref 38–126)
Anion gap: 13 (ref 5–15)
BUN: 35 mg/dL — ABNORMAL HIGH (ref 8–23)
CO2: 22 mmol/L (ref 22–32)
Calcium: 8.9 mg/dL (ref 8.9–10.3)
Chloride: 97 mmol/L — ABNORMAL LOW (ref 98–111)
Creatinine, Ser: 1.19 mg/dL — ABNORMAL HIGH (ref 0.44–1.00)
GFR calc Af Amer: 52 mL/min — ABNORMAL LOW (ref >60)
GFR calc non Af Amer: 45 mL/min — ABNORMAL LOW (ref >60)
Glucose, Bld: 112 mg/dL — ABNORMAL HIGH (ref 70–99)
Potassium: 4.3 mmol/L (ref 3.5–5.1)
Sodium: 132 mmol/L — ABNORMAL LOW (ref 135–145)
Total Bilirubin: 0.8 mg/dL (ref 0.3–1.2)
Total Protein: 6.9 g/dL (ref 6.5–8.1)

## 2019-04-15 LAB — I-STAT CHEM 8, ED
BUN: 36 mg/dL — ABNORMAL HIGH (ref 8–23)
Calcium, Ion: 1.1 mmol/L — ABNORMAL LOW (ref 1.15–1.40)
Chloride: 100 mmol/L (ref 98–111)
Creatinine, Ser: 1 mg/dL (ref 0.44–1.00)
Glucose, Bld: 104 mg/dL — ABNORMAL HIGH (ref 70–99)
HCT: 43 % (ref 36.0–46.0)
Hemoglobin: 14.6 g/dL (ref 12.0–15.0)
Potassium: 4.3 mmol/L (ref 3.5–5.1)
Sodium: 133 mmol/L — ABNORMAL LOW (ref 135–145)
TCO2: 22 mmol/L (ref 22–32)

## 2019-04-15 MED ORDER — SODIUM CHLORIDE 0.9% FLUSH: 3.0000 mL | Freq: Once | INTRAVENOUS | Status: DC

## 2019-04-15 NOTE — Patient Instructions (Signed)
I worry you may have had a stroke - I do want you to go to the ER for evaluation. We will call them to let them know you're coming.

## 2019-04-15 NOTE — Assessment & Plan Note (Signed)
Infected neck wound/cellulitis anterior neck. 10d doxy course started on Tuesday when she saw my partner. Wound seems more red and inflamed than initially - may need broadened coverage.

## 2019-04-15 NOTE — ED Triage Notes (Signed)
Patient sent by PCP at [facility] for stroke work up. Per husband, patient has had increased confusion and difficulty with words the last couple of days. Patient denies any new extremity weakness.

## 2019-04-15 NOTE — Assessment & Plan Note (Addendum)
2d h/o slurred speech, aphasia, AMS with increased confusion and unsteadiness in setting of recent supratherapeutic then subtherapeutic INR. I am concerned she has had a recent stroke and so recommend ER evaluation today. They will go there straight away, we will call to notify she is on her way.

## 2019-04-15 NOTE — Assessment & Plan Note (Signed)
Discussed this will be deferred while we workup possible stroke.

## 2019-04-15 NOTE — ED Provider Notes (Signed)
[facility] EMERGENCY DEPARTMENT Provider Note   CSN: [id number] Arrival date & time: 04/15/19  1246     History   Chief Complaint Chief Complaint  Patient presents with   Stroke Symptoms    HPI Kathryn Mathews is a 73 y.o. female.     73 year old female presents from her doctor's office today due to concern for possible CVA.  Husband states that she has been more confused over the last 3 days.  She has not had any fever, cough, cold symptoms.  No vomiting or diarrhea.  No urinary symptoms.  No recent changes to her medications.  Does have a prior history of CVA but recently had a subtherapeutic INR level.  Has been states her symptoms seem to wax and wane and nothing makes it better or worse and no treatment use prior to arrival     Past Medical History:  Diagnosis Date   Alopecia    well controlled per previous PCP - improved with biotin   Anxiety    when around ppl   Aorto-iliac atherosclerosis (HCC) 05/2016   by xray   Bipolar disorder (HCC)    no psych   Chronic sinusitis    Closed left humeral fracture 2010   closed left humeral neck fracture, also h/o L wrist fx   Closed right humeral fracture 2011   coracoid nondisplaced, conservative therapy (Dr. Cleon Gustin)   Depression    Diverticulosis    by colonoscopy   Falls    completed HHPT 10/2011, declined outpt PT. completed outpt PT 08/2016.   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 DEXA 2016: T  -2.4 hip, T -0.5 spine; rpt 2 yrs   PAD (peripheral artery disease) (HCC)    Phlebitis    left leg stays swollen   Positive ANA (antinuclear antibody)    Pulmonary embolism [facility])     Patient Active Problem List   Diagnosis Date Noted   Slurred speech 04/15/2019   History of pulmonary embolism 04/15/2019   Open neck wound, initial encounter 04/15/2019   Dens fracture, closed, initial encounter (HCC) 12/20/2018   Urine frequency 02/08/2018   Systolic murmur 11/24/2017   Bilateral sensorineural hearing loss 05/27/2017   Memory deficit 05/27/2017   Long term (current) use of anticoagulants 04/29/2017   Gait disturbance 04/22/2017   Right hip pain 06/13/2016   Aorto-iliac atherosclerosis (HCC) 05/14/2016   History of transient ischemic attack (TIA) 10/27/2015   Health maintenance examination 04/12/2015   Insomnia 04/12/2015   Chronic nausea 09/09/2014   Anginal chest pain at rest [facility]) 09/09/2014   Shortness of breath    Abnormal nuclear stress test 09/04/2014   Essential hypertension 09/02/2014   Skin rash 05/29/2014   Advanced care planning/counseling discussion 04/10/2014   Recurrent falls 03/28/2014   Encounter for therapeutic drug monitoring 08/22/2013   Ventral hernia 09/07/2012   Unspecified constipation 09/07/2012   Abdominal discomfort 08/09/2012  Spondylolisthesis, grade 3 07/15/2012   Compression fracture of L1 lumbar vertebra (HCC) 07/15/2012   Loss of taste 03/09/2012   Imbalance 09/18/2011   Dysphagia 07/21/2011   Medicare annual wellness visit, subsequent 02/18/2011   Vitamin D deficiency 02/18/2011   Osteopenia    Bipolar disorder (HCC)    GERD (gastroesophageal reflux disease)    HLD (hyperlipidemia)    Hypothyroid    History of DVT (deep vein thrombosis)    PAD (peripheral artery disease) (HCC)     Past Surgical History:  Procedure Laterality Date   ARTERIAL THROMBECTOMY  07/2009   with R mid popliteal  artery angioplasty [balloon] (2011)[[[[   CARDIAC CATHETERIZATION  08/2014   nl EF, small branch OM not amenable to PCI [name])   CESAREAN SECTION     CESAREAN SECTION  1973   CHOLECYSTECTOMY     COLONOSCOPY  08/2008   int/ext hem, severe diverticulosis with evidence of itis, rec rpt 10 yrs   hospitalization  01/2007   depression, ECT   INGUINAL HERNIA REPAIR  1980s   x2 on right   LEFT HEART CATHETERIZATION WITH CORONARY ANGIOGRAM N/A 09/05/2014   Procedure: LEFT HEART CATHETERIZATION WITH CORONARY ANGIOGRAM;  Surgeon: Marykay Lex, MD;  Location: [facility] CATH LAB;  Service: Cardiovascular;  Laterality: N/A;     OB History    Gravida  0   Para  0   Term  0   Preterm  0   AB  0   Living        SAB  0   TAB  0   Ectopic  0   Multiple      Live Births               Home Medications    Prior to Admission medications   Medication Sig Start Date End Date Taking? Authorizing Provider  acetaminophen (TYLENOL) 325 MG tablet Take 2 tablets (650 mg total) by mouth every 6 (six) hours as needed. 06/05/18   Horton, Mayer Masker, MD  acetaminophen (TYLENOL) 500 MG tablet Take 500 mg by mouth every 8 (eight) hours as needed for moderate pain.    [provider]  ARIPiprazole (ABILIFY) 5 MG tablet Take 1 tablet (5 mg total) by mouth daily. Patient not taking: Reported on 04/07/2019 12/09/18   Clapacs, Jackquline Denmark, MD  Artificial Tear Solution (SOOTHE XP OP) Apply 1 drop to eye 2 (two) times daily as needed.    [provider]  atorvastatin (LIPITOR) 20 MG tablet Take 1 tablet (20 mg total) by mouth daily. 07/22/18   Eustaquio Boyden, MD  Biotin 1000 MCG tablet Take 1 tablet (1 mg total) by mouth 2 (two) times daily. 04/24/16   Eustaquio Boyden, MD  buPROPion [facility] SR) 200 MG 12 hr tablet Take 1 tablet (200 mg total) by mouth 2 (two) times daily. 12/09/18   Clapacs, Jackquline Denmark, MD  Calcium Carb-Cholecalciferol (CALCIUM-VITAMIN D) 600-400 MG-UNIT TABS Take 1  tablet by mouth daily. 11/24/17   Eustaquio Boyden, MD  Cholecalciferol (VITAMIN D3) 25 MCG (1000 UT) CAPS Take 1 capsule (1,000 Units total) by mouth daily. 07/13/18   Eustaquio Boyden, MD  clonazePAM (KLONOPIN) 0.5 MG tablet Take 1 tablet (0.5 mg total) by mouth 2 (two) times daily. 12/09/18   Clapacs, Jackquline Denmark, MD  Cyanocobalamin (VITAMIN B-12 PO)  09/20/17   [provider]  cyclobenzaprine (FLEXERIL) 5 MG tablet Take 1 tablet (5 mg total) by mouth 2 (two) times daily. 04/23/17  Rai, Ripudeep K, MD  diclofenac sodium (VOLTAREN) 1 % GEL Apply 1 application topically 2 (two) times daily as needed. Patient taking differently: Apply 1 application topically 2 (two) times daily as needed (pain).  06/13/16   Eustaquio Boyden, MD  Docusate Calcium (STOOL SOFTENER PO) Take 1 tablet by mouth daily as needed (constipation). Reported on 11/05/2015    [provider]  doxycycline (VIBRA-TABS) 100 MG tablet Take 1 tablet (100 mg total) by mouth 2 (two) times daily for 10 days. 04/07/19 2019/05/08  Copland, Karleen Hampshire, MD  lamoTRIgine (LAMICTAL) 100 MG tablet Take 1 tablet (100 mg total) by mouth 2 (two) times daily. 12/09/18   Clapacs, Jackquline Denmark, MD  levothyroxine (SYNTHROID, LEVOTHROID) 50 MCG tablet Take 1 tablet (50 mcg total) by mouth daily. 07/23/18   Eustaquio Boyden, MD  PRESCRIPTION MEDICATION Place 2 drops into both eyes daily as needed (dryness). Fresh coat    [provider]  Propylene Glycol (SYSTANE COMPLETE OP) Apply 2 drops to eye 2 (two) times daily as needed.    [provider]  traMADol (ULTRAM) 50 MG tablet Take 1 tablet (50 mg total) by mouth 2 (two) times daily as needed for moderate pain. 04/23/17   Rai, Ripudeep K, MD  warfarin (COUMADIN) 5 MG tablet TAKE DAILY AS DIRECTED BY COUMADIN CLINIC.  Take 1-2 pills daily as directed by coumadin clinic. 11/24/17   Eustaquio Boyden, MD    Family History Family History  Problem Relation Age of Onset   Cancer Father     Heart attack Mother 72   Coronary artery disease Mother 85   Hyperlipidemia Mother    Heart disease Mother    Hypertension Mother    Breast cancer Maternal Aunt 62   Coronary artery disease Maternal Uncle    Coronary artery disease Maternal Grandmother 43   Stroke Neg Hx    Diabetes Neg Hx     Social History Social History   Tobacco Use   Smoking status: Never Smoker   Smokeless tobacco: Never Used  Substance Use Topics   Alcohol use: No    Alcohol/week: 0.0 standard drinks   Drug use: No     Allergies   Codeine, Acyclovir and related, and Hydrocodone   Review of Systems Review of Systems  All other systems reviewed and are negative.    Physical Exam Updated Vital Signs BP (!) 92/51 (BP Location: Right Arm)    Pulse 100    Temp 98.5 F (36.9 C) (Oral)    Resp 18    SpO2 97%   Physical Exam Vitals signs and nursing note reviewed.  Constitutional:      General: She is not in acute distress.    Appearance: Normal appearance. She is well-developed. She is not toxic-appearing.  HENT:     Head: Normocephalic and atraumatic.  Eyes:     General: Lids are normal.     Conjunctiva/sclera: Conjunctivae normal.     Pupils: Pupils are equal, round, and reactive to light.  Neck:     Musculoskeletal: Normal range of motion and neck supple.     Thyroid: No thyroid mass.     Trachea: No tracheal deviation.  Cardiovascular:     Rate and Rhythm: Normal rate and regular rhythm.     Heart sounds: Normal heart sounds. No murmur. No gallop.   Pulmonary:     Effort: Pulmonary effort is normal. No respiratory distress.     Breath sounds: Normal breath sounds. No stridor. No decreased  breath sounds, wheezing, rhonchi or rales.  Abdominal:     General: Bowel sounds are normal. There is no distension.     Palpations: Abdomen is soft.     Tenderness: There is no abdominal tenderness. There is no rebound.  Musculoskeletal: Normal range of motion.        General: No  tenderness.  Skin:    General: Skin is warm and dry.     Findings: No abrasion or rash.  Neurological:     Mental Status: She is alert and oriented to person, place, and time.     GCS: GCS eye subscore is 4. GCS verbal subscore is 5. GCS motor subscore is 6.     Cranial Nerves: No cranial nerve deficit.     Sensory: No sensory deficit.     Comments: Strength is 4 /5 in upper as well as lower extremity.  No facial asymmetry.  No slurred speech.  Psychiatric:        Attention and Perception: Attention normal.        Mood and Affect: Affect is flat.        Speech: Speech is delayed.        Behavior: Behavior normal. Behavior is cooperative.      ED Treatments / Results  Labs (all labs ordered are listed, but only abnormal results are displayed) Labs Reviewed  PROTIME-INR - Abnormal; Notable for the following components:      Result Value   Prothrombin Time 20.3 (*)    INR 1.8 (*)    All other components within normal limits  APTT - Abnormal; Notable for the following components:   aPTT 71 (*)    All other components within normal limits  CBC - Abnormal; Notable for the following components:   WBC 14.0 (*)    All other components within normal limits  DIFFERENTIAL - Abnormal; Notable for the following components:   Neutro Abs 9.4 (*)    All other components within normal limits  COMPREHENSIVE METABOLIC PANEL - Abnormal; Notable for the following components:   Sodium 132 (*)    Chloride 97 (*)    Glucose, Bld 112 (*)    BUN 35 (*)    Creatinine, Ser 1.19 (*)    Albumin 3.2 (*)    GFR calc non Af Amer 45 (*)    GFR calc Af Amer 52 (*)    All other components within normal limits  I-STAT CHEM 8, ED - Abnormal; Notable for the following components:   Sodium 133 (*)    BUN 36 (*)    Glucose, Bld 104 (*)    Calcium, Ion 1.10 (*)    All other components within normal limits  URINALYSIS, ROUTINE W REFLEX MICROSCOPIC  CBG MONITORING, ED    EKG EKG  Interpretation  Date/Time:  Friday April 15 2019 13:10:12 EDT Ventricular Rate:  95 PR Interval:  242 QRS Duration: 68 QT Interval:  350 QTC Calculation: 439 R Axis:   -20 Text Interpretation:  Sinus rhythm with 1st degree A-V block Minimal voltage criteria for LVH, may be normal variant ( R in aVL ) Anterolateral infarct , age undetermined Abnormal ECG Confirmed by Lorre Nick ([id number]) on 04/15/2019 9:39:20 PM   Radiology Ct Head Wo Contrast  Result Date: 04/15/2019 CLINICAL DATA:  Focal neuro deficit, possible stroke EXAM: CT HEAD WITHOUT CONTRAST TECHNIQUE: Contiguous axial images were obtained from the base of the skull through the vertex without intravenous contrast. COMPARISON:  12/16/2018 FINDINGS: Brain:  No evidence of acute infarction, hemorrhage, hydrocephalus, extra-axial collection or mass lesion/mass effect. Vascular: No hyperdense vessel or unexpected calcification. Skull: Normal. Negative for fracture or focal lesion. Sinuses/Orbits: No acute finding. Other: None. IMPRESSION: No acute intracranial pathology. Electronically Signed   By: Lauralyn Primes M.D.   On: 04/15/2019 16:48    Procedures Procedures (including critical care time)  Medications Ordered in ED Medications  sodium chloride flush (NS) 0.9 % injection 3 mL (has no administration in time range)     Initial Impression / Assessment and Plan / ED Course  I have reviewed the triage vital signs and the nursing notes.  Pertinent labs & imaging results that were available during my care of the patient were reviewed by me and considered in my medical decision making (see chart for details).        Head CT without acute findings.  Patient had MRI.  Signed out to next provider  Final Clinical Impressions(s) / ED Diagnoses   Final diagnoses:  None    ED Discharge Orders    None       Lorre Nick, MD 04/18/19 [phone]

## 2019-04-15 NOTE — Assessment & Plan Note (Addendum)
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 H/o DVT while therapeutic INR so INR goal is now 2.5-3

## 2019-04-15 NOTE — Progress Notes (Signed)
This visit was conducted in person.  BP 122/78 (BP Location: Left Arm, Patient Position: Sitting, Cuff Size: Normal)   Pulse 83   Temp 97.9 F (36.6 C) (Temporal)   Ht 4\' 7"  (1.397 m)   Wt 149 lb 6 oz (67.8 kg)   SpO2 99%   BMI 34.72 kg/m    CC: possible stroke, wants second opinion for neck Subjective:    Patient ID: Kathryn Mathews, female    DOB: 10/20/1945, 73 y.o.   MRN: [id number]  HPI: Kathryn Mathews is a 73 y.o. female presenting on 04/15/2019 for Aphasia (Per pt's husband, 06/15/2019, pt seemed to have slurred speech and memory loss on 04/13/19.  Concerned pt may have had a stroke. Pt accompanied by husband, [name]. ) and Neck Injury (Pt suffered neck fx on 12/16/18.  Has seen ortho with recommedation for neck surgery.  Pt wants 2nd opinion. )   Last seen at baseline Tuesday night.  Woke up Wednesday morning and husband noticed increase in slurred speech, worsening memory, confusion, garbled and soft voice. Trouble following directions, completing thoughts. More unsteady on her feet as well.   Seen Tuesday this week in office with concern for neck cellulitis from area where first neck collar then face mask was rubbing anterior neck - was placed on doxycycline.   Has been seeing Dr Sunday neurosurgeon for atlas and odontoid fracture sustained 12/2018. Considering neck surgery. Wants second opinion for this. Over last 1-2 days pain has improved. She has not been using her neck collar over the past 1-2 wks.   INR 8.0 (9/24) --> INR 1.1 (9/28). Due for recheck..      Relevant past medical, surgical, family and social history reviewed and updated as indicated. Interim medical history since our last visit reviewed. Allergies and medications reviewed and updated. No facility-administered medications prior to visit.    Outpatient Medications Prior to Visit  Medication Sig Dispense Refill  . acetaminophen (TYLENOL) 325 MG tablet Take 2 tablets (650 mg total) by mouth every 6 (six) hours  as needed. 30 tablet 0  . acetaminophen (TYLENOL) 500 MG tablet Take 500 mg by mouth every 8 (eight) hours as needed for moderate pain.    . Artificial Tear Solution (SOOTHE XP OP) Apply 1 drop to eye 2 (two) times daily as needed.    08-22-1990 atorvastatin (LIPITOR) 20 MG tablet Take 1 tablet (20 mg total) by mouth daily. 90 tablet 3  . Biotin 1000 MCG tablet Take 1 tablet (1 mg total) by mouth 2 (two) times daily. 180 tablet 3  . buPROPion (WELLBUTRIN SR) 200 MG 12 hr tablet Take 1 tablet (200 mg total) by mouth 2 (two) times daily. 180 tablet 1  . Calcium Carb-Cholecalciferol (CALCIUM-VITAMIN D) 600-400 MG-UNIT TABS Take 1 tablet by mouth daily. 60 tablet   . Cholecalciferol (VITAMIN D3) 25 MCG (1000 UT) CAPS Take 1 capsule (1,000 Units total) by mouth daily. 30 capsule   . clonazePAM (KLONOPIN) 0.5 MG tablet Take 1 tablet (0.5 mg total) by mouth 2 (two) times daily. 180 tablet 1  . Cyanocobalamin (VITAMIN B-12 PO)     . cyclobenzaprine (FLEXERIL) 5 MG tablet Take 1 tablet (5 mg total) by mouth 2 (two) times daily. 60 tablet 0  . diclofenac sodium (VOLTAREN) 1 % GEL Apply 1 application topically 2 (two) times daily as needed. (Patient taking differently: Apply 1 application topically 2 (two) times daily as needed (pain). ) 1 Tube 1  . Docusate Calcium (  STOOL SOFTENER PO) Take 1 tablet by mouth daily as needed (constipation). Reported on 11/05/2015    . doxycycline (VIBRA-TABS) 100 MG tablet Take 1 tablet (100 mg total) by mouth 2 (two) times daily for 10 days. 20 tablet 0  . lamoTRIgine (LAMICTAL) 100 MG tablet Take 1 tablet (100 mg total) by mouth 2 (two) times daily. 180 tablet 1  . levothyroxine (SYNTHROID, LEVOTHROID) 50 MCG tablet Take 1 tablet (50 mcg total) by mouth daily. 90 tablet 3  . PRESCRIPTION MEDICATION Place 2 drops into both eyes daily as needed (dryness). Fresh coat    . Propylene Glycol (SYSTANE COMPLETE OP) Apply 2 drops to eye 2 (two) times daily as needed.    . traMADol (ULTRAM) 50  MG tablet Take 1 tablet (50 mg total) by mouth 2 (two) times daily as needed for moderate pain. 20 tablet 0  . warfarin (COUMADIN) 5 MG tablet TAKE DAILY AS DIRECTED BY COUMADIN CLINIC.  Take 1-2 pills daily as directed by coumadin clinic. 120 tablet 3  . ARIPiprazole (ABILIFY) 5 MG tablet Take 1 tablet (5 mg total) by mouth daily. (Patient not taking: Reported on 04/07/2019) 30 tablet 5     Per HPI unless specifically indicated in ROS section below Review of Systems Objective:    BP 122/78 (BP Location: Left Arm, Patient Position: Sitting, Cuff Size: Normal)   Pulse 83   Temp 97.9 F (36.6 C) (Temporal)   Ht 4\' 7"  (1.397 m)   Wt 149 lb 6 oz (67.8 kg)   SpO2 99%   BMI 34.72 kg/m   Wt Readings from Last 3 Encounters:  04/15/19 149 lb 6 oz (67.8 kg)  04/07/19 155 lb 4 oz (70.4 kg)  12/20/18 155 lb 1.6 oz (70.4 kg)    Physical Exam Vitals signs and nursing note reviewed.  Constitutional:      Appearance: She is obese.     Comments: Slowed responses - NEW  Eyes:     Extraocular Movements: Extraocular movements intact.     Pupils: Pupils are equal, round, and reactive to light.  Neck:     Comments: Keeps neck markedly flexed, limited neck ROM - WORSENED Cardiovascular:     Rate and Rhythm: Normal rate and regular rhythm.     Pulses: Normal pulses.     Heart sounds: Normal heart sounds.  Pulmonary:     Effort: Pulmonary effort is normal.     Breath sounds: Normal breath sounds. No wheezing or rhonchi.  Lymphadenopathy:     Cervical: No cervical adenopathy.  Skin:    Findings: Wound (anterior neck wound with surrounding erythema) present.       Neurological:     Mental Status: She is lethargic and confused.     Cranial Nerves: No facial asymmetry.     Comments:  Oriented to person only. Doesn't know date or place - NEW Facial sensation reported normal Facial motor muscles seem intact Tongue movement seems intact Delayed responses, trouble following instructions - NEW  Grip strength intact bilaterally Did not evaluate gait due to worsened unsteadiness   Psychiatric:        Speech: Speech is delayed and slurred.          Lab Results  Component Value Date   INR 1.1 (A) 04/12/2019   INR 8.0 (A) 04/07/2019   INR 1.9 (A) 03/10/2019    Assessment & Plan:   Problem List Items Addressed This Visit    Slurred speech - Primary  2d h/o slurred speech, aphasia, AMS with increased confusion and unsteadiness in setting of recent supratherapeutic then subtherapeutic INR. I am concerned she has had a recent stroke and so recommend ER evaluation today. They will go there straight away, we will call to notify she is on her way.       Open neck wound, initial encounter    Infected neck wound/cellulitis anterior neck. 10d doxy course started on Tuesday when she saw my partner. Wound seems more red and inflamed than initially - may need broadened coverage.       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 H/o DVT while therapeutic INR so INR goal is now 2.5-3      Dens fracture, closed, initial encounter [facility])    Discussed this will be deferred while we workup possible stroke.           No orders of the defined types were placed in this encounter.  No orders of the defined types were placed in this encounter.   Follow up plan: No follow-ups on file.  Ria Bush, MD

## 2019-04-16 ENCOUNTER — Emergency Department (HOSPITAL_COMMUNITY): Payer: Medicare HMO

## 2019-04-16 DIAGNOSIS — G9340 Encephalopathy, unspecified: Secondary | ICD-10-CM | POA: Diagnosis not present

## 2019-04-16 NOTE — ED Provider Notes (Signed)
Patient signed out to me by Dr. Zenia Resides to follow-up on MRI.  Patient seen with progressive cognitive decline including increased confusion and difficulty finding words.  Patient's MRI has been reviewed and there is no sign of acute stroke.  Patient is otherwise stable and will therefore discharged and follow-up with neurology for further work-up.   Orpah Greek, MD 04/16/19 [phone]

## 2019-04-16 NOTE — ED Notes (Signed)
Pt in MRI at this time 

## 2019-04-16 NOTE — ED Notes (Signed)
Discharge instructions discussed with pt. Pt verbalized understanding. Pt stable and ambulatory. No signature pad available. 

## 2019-04-17 ENCOUNTER — Telehealth: Payer: Self-pay | Admitting: Family Medicine

## 2019-04-17 DIAGNOSIS — R404 Transient alteration of awareness: Secondary | ICD-10-CM | POA: Diagnosis not present

## 2019-04-17 DIAGNOSIS — R4020 Unspecified coma: Secondary | ICD-10-CM | POA: Diagnosis not present

## 2019-04-17 DIAGNOSIS — I469 Cardiac arrest, cause unspecified: Secondary | ICD-10-CM | POA: Diagnosis not present

## 2019-04-17 DIAGNOSIS — W19XXXA Unspecified fall, initial encounter: Secondary | ICD-10-CM | POA: Diagnosis not present

## 2019-04-18 NOTE — Telephone Encounter (Signed)
[place] Night - Client Nonclinical Telephone Record AccessNurse Client [place] Night - Client Client Site [place] Physician Ria Bush - MD Contact Type Call Who Is Calling Physician / Provider / Hospital Call Type Provider Call [facility] Page Now Reason for Call Request to speak to Physician Initial Comment Caller states he is with EMS. He is calling back to check on a previous request for a death certificate. Additional Comment Patient Name Enya Bureau Patient DOB 03-11-1946 Requesting Provider Jacqulyn Bath Physician Number [phone] Facility Name [facility] Comments User: Dalia Heading Date/Time [name] Time): 67/12/7207 47:09:62 PM Duplicate of open page Call Closed By: Dalia Heading Transaction Date/Time: 04/28/19 12:00:10 PM (ET)

## 2019-04-18 NOTE — Telephone Encounter (Signed)
Called husband, expressed my condolences.  Death certificate filled out and placed in my out box.

## 2019-04-18 NOTE — Telephone Encounter (Signed)
[place] Night - Client Nonclinical Telephone Record Access[facility]urse Client [place] Night - Client Client Site [place] Physician Ria Bush - MD Contact Type Call Who Is Calling Physician / Provider / Hospital Call Type Provider Call [facility] Page Now Reason for Call Request to speak to Physician Initial Comment Ronalee Belts at ER services has a patient that had passed away. Patient Name Kathryn Mathews Patient DOB 01-11-1946 Requesting Provider [address] Physician Number [phone] Facility Name [facility] Paging [facility] Phone DateTime Result/Outcome Message Type Notes Tedra Coupe- MD [id number] 05-03-19 12:15:58 PM Called On Call Provider - Reached Doctor Paged Tedra Coupe- MD 2019/05/03 07:12:19 PM Spoke with On Call - General Message Result Call Closed By: Dalia Heading Transaction Date/Time: 2019-05-03 11:42:13 AM (ET)

## 2019-05-15 DIAGNOSIS — 419620001 Death: Secondary | SNOMED CT | POA: Diagnosis not present

## 2019-05-15 NOTE — Telephone Encounter (Signed)
Call by answering service from [facility].   Patient was seen in office on 10/2 and sent to ER for further evaluation due to aphasia. Was discharged home from ER after evaluation, but per family never improved symptomatically. She was not herself all day yesterday. She was unclear with speech, not recognizing family members. Husband put her to bed at 7:30 last night in her recliner (where she normally sleeps) and he found her this morning at 9am. Per paramedics she was cold and stiff. They suspect she passed away last evening 04-20-2023).   They will be sending death certificate for completion to Dr. Danise Mina.

## 2019-05-15 DEATH — deceased

## 2019-07-04 ENCOUNTER — Ambulatory Visit: Payer: Medicare HMO

## 2019-07-18 ENCOUNTER — Encounter: Payer: Medicare HMO | Admitting: Family Medicine
